# Patient Record
Sex: Female | Born: 1980 | Race: White | Hispanic: No | Marital: Single | Smoking: Never smoker
Health system: Southern US, Community
[De-identification: ages and names within clinical notes are randomized; demographics above are authoritative.]

## PROBLEM LIST (undated history)

## (undated) DIAGNOSIS — K219 Gastro-esophageal reflux disease without esophagitis: Secondary | ICD-10-CM

## (undated) DIAGNOSIS — E039 Hypothyroidism, unspecified: Secondary | ICD-10-CM

## (undated) DIAGNOSIS — K5900 Constipation, unspecified: Secondary | ICD-10-CM

## (undated) DIAGNOSIS — M549 Dorsalgia, unspecified: Secondary | ICD-10-CM

## (undated) DIAGNOSIS — F419 Anxiety disorder, unspecified: Secondary | ICD-10-CM

## (undated) DIAGNOSIS — J45909 Unspecified asthma, uncomplicated: Secondary | ICD-10-CM

## (undated) DIAGNOSIS — R5383 Other fatigue: Secondary | ICD-10-CM

## (undated) DIAGNOSIS — G4730 Sleep apnea, unspecified: Secondary | ICD-10-CM

## (undated) DIAGNOSIS — I10 Essential (primary) hypertension: Secondary | ICD-10-CM

## (undated) DIAGNOSIS — T783XXA Angioneurotic edema, initial encounter: Secondary | ICD-10-CM

## (undated) HISTORY — DX: Other fatigue: R53.83

## (undated) HISTORY — DX: Constipation, unspecified: K59.00

## (undated) HISTORY — PX: TONSILLECTOMY: SUR1361

## (undated) HISTORY — DX: Hypothyroidism, unspecified: E03.9

## (undated) HISTORY — DX: Angioneurotic edema, initial encounter: T78.3XXA

## (undated) HISTORY — PX: BACK SURGERY: SHX140

## (undated) HISTORY — PX: COLONOSCOPY: SHX174

## (undated) HISTORY — PX: WISDOM TOOTH EXTRACTION: SHX21

---

## 2008-01-18 ENCOUNTER — Emergency Department (HOSPITAL_COMMUNITY): Admission: EM | Admit: 2008-01-18 | Discharge: 2008-01-19 | Payer: Self-pay | Admitting: Emergency Medicine

## 2010-07-25 ENCOUNTER — Encounter: Payer: Self-pay | Admitting: Family Medicine

## 2011-04-01 LAB — DIFFERENTIAL
Basophils Absolute: 0
Lymphocytes Relative: 6 — ABNORMAL LOW
Monocytes Absolute: 1.3 — ABNORMAL HIGH
Neutro Abs: 15.7 — ABNORMAL HIGH

## 2011-04-01 LAB — CBC
Hemoglobin: 14.2
RBC: 4.36
RDW: 12.6
WBC: 18 — ABNORMAL HIGH

## 2011-04-01 LAB — BASIC METABOLIC PANEL
BUN: 7
GFR calc non Af Amer: >60
Potassium: 4
Sodium: 133 — ABNORMAL LOW

## 2013-08-15 ENCOUNTER — Other Ambulatory Visit: Payer: Self-pay | Admitting: Family Medicine

## 2013-08-15 DIAGNOSIS — J329 Chronic sinusitis, unspecified: Secondary | ICD-10-CM

## 2013-08-21 ENCOUNTER — Other Ambulatory Visit: Payer: Self-pay

## 2013-08-28 ENCOUNTER — Ambulatory Visit
Admission: RE | Admit: 2013-08-28 | Discharge: 2013-08-28 | Disposition: A | Discharge status: Home / Self Care | Payer: BC Managed Care – PPO | Source: Ambulatory Visit | Attending: Family Medicine | Admitting: Family Medicine

## 2013-08-28 DIAGNOSIS — J329 Chronic sinusitis, unspecified: Secondary | ICD-10-CM

## 2016-07-16 DIAGNOSIS — J069 Acute upper respiratory infection, unspecified: Secondary | ICD-10-CM | POA: Diagnosis not present

## 2016-07-16 DIAGNOSIS — J309 Allergic rhinitis, unspecified: Secondary | ICD-10-CM | POA: Diagnosis not present

## 2016-07-16 DIAGNOSIS — R040 Epistaxis: Secondary | ICD-10-CM | POA: Diagnosis not present

## 2016-07-23 DIAGNOSIS — J209 Acute bronchitis, unspecified: Secondary | ICD-10-CM | POA: Diagnosis not present

## 2016-10-31 DIAGNOSIS — E039 Hypothyroidism, unspecified: Secondary | ICD-10-CM | POA: Diagnosis not present

## 2016-11-21 DIAGNOSIS — M5432 Sciatica, left side: Secondary | ICD-10-CM | POA: Diagnosis not present

## 2016-11-21 DIAGNOSIS — M9902 Segmental and somatic dysfunction of thoracic region: Secondary | ICD-10-CM | POA: Diagnosis not present

## 2016-11-21 DIAGNOSIS — M9901 Segmental and somatic dysfunction of cervical region: Secondary | ICD-10-CM | POA: Diagnosis not present

## 2016-11-30 DIAGNOSIS — M5432 Sciatica, left side: Secondary | ICD-10-CM | POA: Diagnosis not present

## 2016-11-30 DIAGNOSIS — M9901 Segmental and somatic dysfunction of cervical region: Secondary | ICD-10-CM | POA: Diagnosis not present

## 2016-11-30 DIAGNOSIS — M9902 Segmental and somatic dysfunction of thoracic region: Secondary | ICD-10-CM | POA: Diagnosis not present

## 2016-12-15 DIAGNOSIS — K591 Functional diarrhea: Secondary | ICD-10-CM | POA: Diagnosis not present

## 2016-12-16 DIAGNOSIS — J0100 Acute maxillary sinusitis, unspecified: Secondary | ICD-10-CM | POA: Diagnosis not present

## 2016-12-19 DIAGNOSIS — K5900 Constipation, unspecified: Secondary | ICD-10-CM | POA: Diagnosis not present

## 2016-12-25 DIAGNOSIS — J018 Other acute sinusitis: Secondary | ICD-10-CM | POA: Diagnosis not present

## 2016-12-25 DIAGNOSIS — J3089 Other allergic rhinitis: Secondary | ICD-10-CM | POA: Diagnosis not present

## 2017-01-02 DIAGNOSIS — Z23 Encounter for immunization: Secondary | ICD-10-CM | POA: Diagnosis not present

## 2017-01-02 DIAGNOSIS — Z7189 Other specified counseling: Secondary | ICD-10-CM | POA: Diagnosis not present

## 2017-01-02 DIAGNOSIS — J309 Allergic rhinitis, unspecified: Secondary | ICD-10-CM | POA: Diagnosis not present

## 2017-03-11 DIAGNOSIS — N390 Urinary tract infection, site not specified: Secondary | ICD-10-CM | POA: Diagnosis not present

## 2017-05-03 DIAGNOSIS — Z23 Encounter for immunization: Secondary | ICD-10-CM | POA: Diagnosis not present

## 2017-05-16 DIAGNOSIS — Z23 Encounter for immunization: Secondary | ICD-10-CM | POA: Diagnosis not present

## 2017-05-16 DIAGNOSIS — R0683 Snoring: Secondary | ICD-10-CM | POA: Diagnosis not present

## 2017-05-16 DIAGNOSIS — J45909 Unspecified asthma, uncomplicated: Secondary | ICD-10-CM | POA: Diagnosis not present

## 2017-05-18 DIAGNOSIS — J45901 Unspecified asthma with (acute) exacerbation: Secondary | ICD-10-CM | POA: Diagnosis not present

## 2017-06-05 DIAGNOSIS — Z23 Encounter for immunization: Secondary | ICD-10-CM | POA: Diagnosis not present

## 2017-07-27 DIAGNOSIS — J0100 Acute maxillary sinusitis, unspecified: Secondary | ICD-10-CM | POA: Diagnosis not present

## 2017-07-27 DIAGNOSIS — J111 Influenza due to unidentified influenza virus with other respiratory manifestations: Secondary | ICD-10-CM | POA: Diagnosis not present

## 2017-07-27 DIAGNOSIS — J209 Acute bronchitis, unspecified: Secondary | ICD-10-CM | POA: Diagnosis not present

## 2017-09-01 DIAGNOSIS — J019 Acute sinusitis, unspecified: Secondary | ICD-10-CM | POA: Diagnosis not present

## 2017-09-01 DIAGNOSIS — J4521 Mild intermittent asthma with (acute) exacerbation: Secondary | ICD-10-CM | POA: Diagnosis not present

## 2017-09-01 DIAGNOSIS — J111 Influenza due to unidentified influenza virus with other respiratory manifestations: Secondary | ICD-10-CM | POA: Diagnosis not present

## 2017-11-06 DIAGNOSIS — Z23 Encounter for immunization: Secondary | ICD-10-CM | POA: Diagnosis not present

## 2017-11-20 DIAGNOSIS — J0100 Acute maxillary sinusitis, unspecified: Secondary | ICD-10-CM | POA: Diagnosis not present

## 2017-11-20 DIAGNOSIS — J209 Acute bronchitis, unspecified: Secondary | ICD-10-CM | POA: Diagnosis not present

## 2017-11-20 DIAGNOSIS — J45909 Unspecified asthma, uncomplicated: Secondary | ICD-10-CM | POA: Diagnosis not present

## 2017-12-21 DIAGNOSIS — R112 Nausea with vomiting, unspecified: Secondary | ICD-10-CM | POA: Diagnosis not present

## 2017-12-22 DIAGNOSIS — R112 Nausea with vomiting, unspecified: Secondary | ICD-10-CM | POA: Diagnosis not present

## 2017-12-22 DIAGNOSIS — R0981 Nasal congestion: Secondary | ICD-10-CM | POA: Diagnosis not present

## 2017-12-25 DIAGNOSIS — R112 Nausea with vomiting, unspecified: Secondary | ICD-10-CM | POA: Diagnosis not present

## 2017-12-26 DIAGNOSIS — N951 Menopausal and female climacteric states: Secondary | ICD-10-CM | POA: Diagnosis not present

## 2017-12-27 DIAGNOSIS — E039 Hypothyroidism, unspecified: Secondary | ICD-10-CM | POA: Diagnosis not present

## 2017-12-27 DIAGNOSIS — E7800 Pure hypercholesterolemia, unspecified: Secondary | ICD-10-CM | POA: Diagnosis not present

## 2018-01-02 ENCOUNTER — Other Ambulatory Visit: Payer: Self-pay | Admitting: Gastroenterology

## 2018-01-02 DIAGNOSIS — R945 Abnormal results of liver function studies: Secondary | ICD-10-CM | POA: Diagnosis not present

## 2018-01-02 DIAGNOSIS — R110 Nausea: Secondary | ICD-10-CM

## 2018-01-02 DIAGNOSIS — R7989 Other specified abnormal findings of blood chemistry: Secondary | ICD-10-CM

## 2018-01-05 ENCOUNTER — Ambulatory Visit
Admission: RE | Admit: 2018-01-05 | Discharge: 2018-01-05 | Disposition: A | Discharge status: Home / Self Care | Payer: No Typology Code available for payment source | Source: Ambulatory Visit | Attending: Gastroenterology | Admitting: Gastroenterology

## 2018-01-05 DIAGNOSIS — R110 Nausea: Secondary | ICD-10-CM

## 2018-01-05 DIAGNOSIS — R945 Abnormal results of liver function studies: Secondary | ICD-10-CM

## 2018-01-05 DIAGNOSIS — R7989 Other specified abnormal findings of blood chemistry: Secondary | ICD-10-CM | POA: Diagnosis not present

## 2018-02-02 DIAGNOSIS — J019 Acute sinusitis, unspecified: Secondary | ICD-10-CM | POA: Diagnosis not present

## 2018-02-13 DIAGNOSIS — J019 Acute sinusitis, unspecified: Secondary | ICD-10-CM | POA: Diagnosis not present

## 2018-02-13 DIAGNOSIS — J45901 Unspecified asthma with (acute) exacerbation: Secondary | ICD-10-CM | POA: Diagnosis not present

## 2018-03-08 DIAGNOSIS — J209 Acute bronchitis, unspecified: Secondary | ICD-10-CM | POA: Diagnosis not present

## 2018-04-13 DIAGNOSIS — Z23 Encounter for immunization: Secondary | ICD-10-CM | POA: Diagnosis not present

## 2018-04-13 DIAGNOSIS — J0100 Acute maxillary sinusitis, unspecified: Secondary | ICD-10-CM | POA: Diagnosis not present

## 2018-05-07 DIAGNOSIS — J209 Acute bronchitis, unspecified: Secondary | ICD-10-CM | POA: Diagnosis not present

## 2018-05-11 DIAGNOSIS — R05 Cough: Secondary | ICD-10-CM | POA: Diagnosis not present

## 2018-05-17 ENCOUNTER — Ambulatory Visit (INDEPENDENT_AMBULATORY_CARE_PROVIDER_SITE_OTHER): Payer: 59 | Admitting: Psychiatry

## 2018-05-17 ENCOUNTER — Encounter: Payer: Self-pay | Admitting: Psychiatry

## 2018-05-17 DIAGNOSIS — F325 Major depressive disorder, single episode, in full remission: Secondary | ICD-10-CM | POA: Diagnosis not present

## 2018-05-17 DIAGNOSIS — F4001 Agoraphobia with panic disorder: Secondary | ICD-10-CM | POA: Diagnosis not present

## 2018-05-17 MED ORDER — PAROXETINE HCL 20 MG PO TABS: 40.0000 mg | ORAL_TABLET | Freq: Every day | ORAL | 0 refills | Status: DC

## 2018-05-17 NOTE — Progress Notes (Signed)
Carolyn Castro [id number] [birth year] 37 y.o.  Subjective:   Patient ID:  Carolyn Castro is a 37 y.o. (DOB Apr 28, 1981) female.  Chief Complaint:  Chief Complaint  Patient presents with  . Anxiety  . Follow-up    med changes    HPI Carolyn Castro presents to the office today for follow-up of panic disorder after change at last visit from sertraline to paroxetine 40. Worked a lot better.  Feels less down.  Sleeps better.  Better motivation.  Less dread of work.  More normal interest and motivation and activity.  Lighter and more hopeful.  Panic reduced to 1/2 weeks.  Was having 3-4/week.  On paxil 40 for 4 weeks.    Review of Systems:  Review of Systems  Respiratory: Positive for cough, chest tightness, shortness of breath and wheezing.   Neurological: Positive for weakness. Negative for tremors.  Psychiatric/Behavioral: Negative for agitation, behavioral problems, confusion, decreased concentration, dysphoric mood, hallucinations, self-injury, sleep disturbance and suicidal ideas. The patient is not nervous/anxious and is not hyperactive.   asthma about the same.  Medications: I have reviewed the patient's current medications.  Current Outpatient Medications  Medication Sig Dispense Refill  . albuterol (ACCUNEB) 0.63 MG/3ML nebulizer solution Take 1 ampule by nebulization every 6 (six) hours as needed for wheezing.    Marland Kitchen ALPRAZolam (XANAX) 0.5 MG tablet Take 0.5 mg by mouth 2 (two) times daily as needed for anxiety.    . fexofenadine (ALLEGRA) 180 MG tablet Take 180 mg by mouth daily.    Marland Kitchen levothyroxine (SYNTHROID, LEVOTHROID) 75 MCG tablet Take 75 mcg by mouth daily before breakfast.    . mometasone (ASMANEX) 220 MCG/INH inhaler Inhale 2 puffs into the lungs daily.    . mometasone (NASONEX) 50 MCG/ACT nasal spray Place 2 sprays into the nose daily.    . montelukast (SINGULAIR) 10 MG tablet Take 10 mg by mouth at bedtime.    Marland Kitchen PARoxetine (PAXIL) 20 MG tablet Take 2  tablets (40 mg total) by mouth daily. 180 tablet 0   No current facility-administered medications for this visit.     Medication Side Effects: None  Allergies:  Allergies  Allergen Reactions  . Ciprofloxacin Hcl Diarrhea    C Diff    History reviewed. No pertinent past medical history.  History reviewed. No pertinent family history.  Social History   Socioeconomic History  . Marital status: Single    Spouse name: Not on file  . Number of children: Not on file  . Years of education: Not on file  . Highest education level: Not on file  Occupational History  . Not on file  Social Needs  . Financial resource strain: Not on file  . Food insecurity:    Worry: Not on file    Inability: Not on file  . Transportation needs:    Medical: Not on file    Non-medical: Not on file  Tobacco Use  . Smoking status: Never Smoker  . Smokeless tobacco: Never Used  Substance and Sexual Activity  . Alcohol use: Not on file  . Drug use: Not on file  . Sexual activity: Not on file  Lifestyle  . Physical activity:    Days per week: Not on file    Minutes per session: Not on file  . Stress: Not on file  Relationships  . Social connections:    Talks on phone: Not on file    Gets together: Not on file    Attends religious  service: Not on file    Active member of club or organization: Not on file    Attends meetings of clubs or organizations: Not on file    Relationship status: Not on file  . Intimate partner violence:    Fear of current or ex partner: Not on file    Emotionally abused: Not on file    Physically abused: Not on file    Forced sexual activity: Not on file  Other Topics Concern  . Not on file  Social History Narrative  . Not on file    Past Medical History, Surgical history, Social history, and Family history were reviewed and updated as appropriate.   Please see review of systems for further details on the patient's review from today.   Objective:   Physical  Exam:  There were no vitals taken for this visit.  Physical Exam  Constitutional: She is oriented to person, place, and time. She appears well-developed. No distress.  Musculoskeletal: She exhibits no deformity.  Neurological: She is alert and oriented to person, place, and time. She displays no tremor. Coordination and gait normal.  Psychiatric: Her speech is normal and behavior is normal. Judgment and thought content normal. Her mood appears anxious. Her affect is not angry, not blunt, not labile and not inappropriate. Cognition and memory are normal. She does not exhibit a depressed mood. She expresses no homicidal and no suicidal ideation. She expresses no suicidal plans and no homicidal plans.  Insight intact. No auditory or visual hallucinations. Less depressed and less anxious. Anxiety more manageable with less Xananax.    Lab Review:     Component Value Date/Time   NA 133 (L) 01/19/2008 0003   K 4.0 01/19/2008 0003   CL 102 01/19/2008 0003   CO2 24 01/19/2008 0003   GLUCOSE 94 01/19/2008 0003   BUN 7 01/19/2008 0003   CREATININE 1.07 01/19/2008 0003   CALCIUM 8.9 01/19/2008 0003   GFRNONAA >60 01/19/2008 0003   GFRAA  01/19/2008 0003    >60        The eGFR has been calculated using the MDRD equation. This calculation has not been validated in all clinical       Component Value Date/Time   WBC 18.0 (H) 01/19/2008 0003   RBC 4.36 01/19/2008 0003   HGB 14.2 01/19/2008 0003   HCT 40.3 01/19/2008 0003   PLT 300 01/19/2008 0003   MCV 92.6 01/19/2008 0003   MCHC 35.3 01/19/2008 0003   RDW 12.6 01/19/2008 0003   LYMPHSABS 1.0 01/19/2008 0003   MONOABS 1.3 (H) 01/19/2008 0003   EOSABS 0.0 01/19/2008 0003   BASOSABS 0.0 01/19/2008 0003    No results found for: POCLITH, LITHIUM   No results found for: PHENYTOIN, PHENOBARB, VALPROATE, CBMZ   .res Assessment: Plan:    Panic disorder with agoraphobia - Plan: PARoxetine (PAXIL) 20 MG tablet  Major depressive  disorder with single episode, in full remission ([place]) - Plan: PARoxetine (PAXIL) 20 MG tablet   History of severe panic disorder and mild to moderate major depression which did not respond to sertraline 200 mg a day.  She tolerated the switch to 40 mg paroxetine well.  She is had an excellent response so far with a dramatic reduction in panic attacks.  They have not stopped at this point however.  She uses Xanax as needed and there is no evidence of abuse.  Greatly relieved at the excellent response to paroxetine vs sertraline.  We discussed the short-term  risks associated with benzodiazepines including sedation and increased fall risk among others.  Discussed long-term side effect risk including dependence, potential withdrawal symptoms, and the potential eventual dose-related risk of dementia.  This appointment was 15 minutes  Follow-up 8 weeks  Lynder Parents MD, DFAPA Please see After Visit Summary for patient specific instructions.  No future appointments.  No orders of the defined types were placed in this encounter.     -------------------------------

## 2018-06-14 DIAGNOSIS — M545 Low back pain: Secondary | ICD-10-CM | POA: Diagnosis not present

## 2018-06-14 DIAGNOSIS — R102 Pelvic and perineal pain: Secondary | ICD-10-CM | POA: Diagnosis not present

## 2018-06-19 DIAGNOSIS — R102 Pelvic and perineal pain: Secondary | ICD-10-CM | POA: Diagnosis not present

## 2018-06-19 DIAGNOSIS — N949 Unspecified condition associated with female genital organs and menstrual cycle: Secondary | ICD-10-CM | POA: Diagnosis not present

## 2018-06-19 DIAGNOSIS — J45909 Unspecified asthma, uncomplicated: Secondary | ICD-10-CM | POA: Diagnosis not present

## 2018-06-20 ENCOUNTER — Other Ambulatory Visit: Payer: Self-pay

## 2018-06-20 ENCOUNTER — Encounter (HOSPITAL_BASED_OUTPATIENT_CLINIC_OR_DEPARTMENT_OTHER): Payer: Self-pay | Admitting: *Deleted

## 2018-06-25 ENCOUNTER — Other Ambulatory Visit: Payer: Self-pay | Admitting: Obstetrics and Gynecology

## 2018-06-28 ENCOUNTER — Other Ambulatory Visit: Payer: Self-pay | Admitting: Obstetrics and Gynecology

## 2018-06-28 NOTE — H&P (Signed)
Chief Complaint(s):   New Patient/L Adnexal Pain and Cyst found on ultrasound/Ref from [employer]   HPI:  General 37 y/o presents as new patient for left adnexal pain and cyst She was referred to the office by Dr. Shirlean Mylararol Webb U/S results from 06/15/2018 revealed uterus at 8.3cm X 3.9cm X 4.6cm. Enometrial thickening of 4.3cm. Right ovary measured at 2.5cm however showed multi septated left adenxal cystic lesion that was measuring at 7.1cm X 4.2cm X 5.2cm. She states she is experiencing pain as in a knife being twisted to the left ovary. The pain is so severe it is now throbbing to lower back and legs. Feels nauseous as well. States it feels as if it is constricting in feeling. She rates the pain as 10/10, in office today she rates it as 7/10. She has been taking Ibuprofen and Tramadol for pain management. She mentions if she's resting at home the pain is at a 6/10 but gets worse when she starts moving. States this morning pain started to radiate up the left side of the abdomen and has gotten so bad with pain.  Denies any sexual activity, is a virgin. Has been experiencing constipation and diarrhea.  Mentions every so often after her menses through the years she'll experience sharp pain than experience a "burst", pain subsides shortly afterwards. Pelvic exam performed in office today. Denies itching, discharge, or odor from vagina. Current Medication: Taking Albuterol 90 MCG/ACT Aerosol Solution 2 puffs Inhalation as needed. Aleve(Naproxen) 220 MG Capsule Orally as needed. Alprazolam 1 MG Tablet 1/2 tab Orally Twice a day prn. Asmanex HFA(Mometasone Furoate) 200 MCG/ACT Aerosol 2 puffs Inhalation Twice a day. Benadryl(diphenhydrAMINE HCl) 25 MG Capsule 1 capsule Orally every 6 hrs as needed. Colace(Docusate Sodium) 100 MG Capsule 1 capsule Orally twice a day. Cyclobenzaprine HCl 10 MG Tablet TAKE 1 TABLET BY MOUTH AT BEDTIME AS NEEDED FOR MUSCLE SPAMS. Fexofenadine HCl 180 MG Tablet 1  tablet Orally Once a day. Ibuprofen 200 MG Tablet 3-4 tablets Orally every 6 hrs as needed. Ipratropium-Albuterol 0.5-2.5 (3) MG/3ML Solution USE 1 VIAL IN NEBULIZER EVERY 6 HRS AS NEEDED. MiraLax(Polyethylene Glycol 3350) . Packet as directed Orally 1 capful once a day as needed. Nasonex(Mometasone Furoate) 50 MCG/ACT Suspension 2 sprays in each nostril Nasally Once a day. Ondansetron HCl 4 MG Tablet Q4-6 H PRN nausea Orally. Paroxetine HCl 20 MG Tablet 2 tablets in the morning Orally Once a day. Singulair(Montelukast Sodium) 10 MG Tablet 1 tablet in the evening Orally Once a day. Synthroid(L-Thyroxine Sodium) 75 MCG Tablet 1 tablet Orally Once a day. Tramadol HCl 50 MG Tablet 1-2 tablet as needed Orally once daily prn severe pain. Tylenol Extra Strength(APAP) 500 MG Tablet 2 tablet as needed Orally every 6 hrs. Medication List reviewed and reconciled with the patient. Medical History:  allergic rhinitis, asthma- illness induced- testing > 10 years ago     anxiety, panic attacks- controlled without meds     prone to mouth ulcers     Other symptoms referable to back     Pain in joint, ankle and foot     Migraine with aura, without mention of intractable migraine without mention of status migrainosus     Allergic rhinitis due to pollen     Esophageal reflux     Pain in joint, ankle and foot     Other symptoms referable to back     Obesity (BMI 30-39.9)     Asthma, mild intermittent     Generalized anxiety disorder  Seasonal allergies     rectal bleeding- anal fissure, hemorrhoids     hemorrhoids     Hypothyroidism      Allergies/Intolerance:  Cipro: Side Effects - c-diff     Fluticasone Propionate: Side Effects - Headaches     Nasacort Allergy 24HR: Allergy - rash on face   Gyn History:  Sexual activity never sexually active. Periods : regular. LMP 05/21/18. Birth control none. Denies Last pap smear date never had one. Last mammogram date N/A.   OB History:   Never been pregnant per patient.   Surgical History:  wisdom teeth extraction     colonoscopy 10/16/2014     tonsillectomy 10/24/14     Right ear - tube 10/24/14   Hospitalization:  No Hospitalization History.   Family History:  Father: alive 5761 yrs, hypercholesterolemia, hypertension, knee replacement, surgery to remove scar tissue on l leg, anxiety, diagnosed with Hypertension    Mother: alive 6960 yrs, hypertension, chronic sinus problems, benign breast lum,ps, ovarian cysts, surgyery on eye- -?cataract, colon polyps, Hypertension    Paternal Grand Mother: alive, hypothyroidism   No heart disease, No one with stents.No breast cancer, ovarian, colon cancer or liver disease. Thyroid disease: aunt, PGM Grandmother and mother have gallbladder problems a great grandmother had pancreatic cancer.  Social History: General no EXPOSURE TO PASSIVE SMOKE.  no Alcohol.  Children: none.  Caffeine: yes, soda, 4-5 servings daily.  EDUCATION: yes, [employer].  Tobacco use cigarettes: Never smoked, Tobacco history last updated 06/19/2018, Vaping No.  Marital Status: single.  no Recreational drug use.  OCCUPATION: employed, [employer].  Exercise: yes, 5-6 times weekly , walking.   ROS: CONSTITUTIONAL No" options="no,yes" propid="91" itemid="193425" categoryid="10464" encounterid="[id number]"Chills No. No" options="no,yes" propid="91" itemid="172899" categoryid="10464" encounterid="[id number]"Fatigue No. No" options="no,yes" propid="91" itemid="10467" categoryid="10464" encounterid="[id number]"Fever No. No" options="no,yes" propid="91" itemid="193426" categoryid="10464" encounterid="[id number]"Night sweats No. No" options="no,yes" propid="91" itemid="444261" categoryid="10464" encounterid="[id number]"Recent travel outside [place] No. No" options="no,yes" propid="91" itemid="193427" categoryid="10464" encounterid="[id number]"Sweats No. No" options="no,yes" propid="91" itemid="194825"  categoryid="10464" encounterid="[id number]"Weight change No.  OPHTHALMOLOGY no" options="no,yes" propid="91" itemid="12520" categoryid="12516" encounterid="[id number]"Blurring of vision no. no" options="no,yes" propid="91" itemid="193469" categoryid="12516" encounterid="[id number]"Change in vision no. no" options="no,yes" propid="91" itemid="194379" categoryid="12516" encounterid="[id number]"Double vision no.  ENT no" options="no,yes" propid="91" itemid="193612" categoryid="10481" encounterid="[id number]"Dizziness no. Nose bleeds no. Sore throat no. Teeth pain no.  ALLERGY no" options="no,yes" propid="91" itemid="202589" categoryid="138152" encounterid="[id number]"Hives no.  CARDIOLOGY no" options="no,yes" propid="91" itemid="193603" categoryid="10488" encounterid="[id number]"Chest pain no. no" options="no,yes" propid="91" itemid="199089" categoryid="10488" encounterid="[id number]"High blood pressure no. no" options="no,yes" propid="91" itemid="202598" categoryid="10488" encounterid="[id number]"Irregular heart beat no. no" options="no,yes" propid="91" itemid="10491" categoryid="10488" encounterid="[id number]"Leg edema no. no" options="no,yes" propid="91" itemid="10490" categoryid="10488" encounterid="[id number]"Palpitations no.  RESPIRATORY no" options="no" propid="91" itemid="270013" categoryid="138132" encounterid="[id number]"Shortness of breath no. no" options="no,yes" propid="91" itemid="172745" categoryid="138132" encounterid="[id number]"Cough no. no" options="no,yes" propid="91" itemid="193621" categoryid="138132" encounterid="[id number]"Wheezing no.  UROLOGY no" options="no,yes" propid="91" itemid="194377" categoryid="138166" encounterid="[id number]"Pain with urination no. no" options="no,yes" propid="91" itemid="193493" categoryid="138166" encounterid="[id number]"Urinary urgency no. no" options="no,yes" propid="91" itemid="193492" categoryid="138166" encounterid="[id number]"Urinary frequency no. no" options="no,yes" propid="91"  itemid="138171" categoryid="138166" encounterid="[id number]"Urinary incontinence no. No" options="no,yes" propid="91" itemid="138167" categoryid="138166" encounterid="[id number]"Difficulty urinating No. No" options="no,yes" propid="91" itemid="138168" categoryid="138166" encounterid="[id number]"Blood in urine No.  GASTROENTEROLOGY no" options="no,yes" propid="91" itemid="10496" categoryid="10494" encounterid="[id number]"Abdominal pain no. no" options="no,yes" propid="91" itemid="193447" categoryid="10494" encounterid="[id number]"Appetite change no. no" options="no,yes" propid="91" itemid="193448" categoryid="10494" encounterid="[id number]"Bloating/belching no. no" options="no,yes" propid="91" itemid="10503" categoryid="10494" encounterid="[id number]"Blood in stool or on toilet paper no. no" options="no,yes" propid="91" itemid="199106" categoryid="10494" encounterid="[id number]"Change in bowel movements no. no" options="no,yes" propid="91" itemid="10501" categoryid="10494" encounterid="[id number]"Constipation no. no" options="no,yes" propid="91" itemid="10502" categoryid="10494" encounterid="[id number]"Diarrhea no. no" options="no,yes" propid="91" itemid="199104" categoryid="10494" encounterid="[id number]"Difficulty swallowing no. no" options="no,yes" propid="91" itemid="10499" categoryid="10494" encounterid="[id number]"Nausea no.  FEMALE REPRODUCTIVE no"  options="no,yes" propid="91" itemid="453725" categoryid="10525" encounterid="[id number]"Vulvar pain no. no" options="no,yes" propid="91" itemid="453726" categoryid="10525" encounterid="[id number]"Vulvar rash no. no" options="no, yes" propid="91" itemid="444315" categoryid="10525" encounterid="[id number]"Abnormal vaginal bleeding no. no" options="no,yes" propid="91" itemid="186083" categoryid="10525" encounterid="[id number]"Breast pain no. no" options="no,yes" propid="91" itemid="186084" categoryid="10525" encounterid="[id number]"Nipple discharge no. no" options="no,yes" propid="91"  itemid="275823" categoryid="10525" encounterid="[id number]"Pain with intercourse no. yes" options="no,yes" propid="91" itemid="186082" categoryid="10525" encounterid="[id number]"Pelvic pain yes. no" options="no,yes" propid="91" itemid="278230" categoryid="10525" encounterid="[id number]"Unusual vaginal discharge no. no" options="no,yes" propid="91" itemid="278942" categoryid="10525" encounterid="[id number]"Vaginal itching no.  MUSCULOSKELETAL no" options="no,yes" propid="91" itemid="193461" categoryid="10514" encounterid="[id number]"Muscle aches no.  NEUROLOGY no" options="no,yes" propid="91" itemid="12513" categoryid="12512" encounterid="[id number]"Headache no. no" options="no,yes" propid="91" itemid="12514" categoryid="12512" encounterid="[id number]"Tingling/numbness no. no" options="no,yes" propid="91" itemid="193468" categoryid="12512" encounterid="[id number]"Weakness no.  PSYCHOLOGY no" options="" propid="91" itemid="275919" categoryid="10520" encounterid="[id number]"Depression no. no" options="no,yes" propid="91" itemid="172748" categoryid="10520" encounterid="[id number]"Anxiety no. no" options="no,yes" propid="91" itemid="199158" categoryid="10520" encounterid="[id number]"Nervousness no. no" options="no,yes" propid="91" itemid="12502" categoryid="10520" encounterid="[id number]"Sleep disturbances no. no " options="no,yes" propid="91" itemid="72718" categoryid="10520" encounterid="[id number]"Suicidal ideation no .  ENDOCRINOLOGY no" options="no,yes" propid="91" itemid="194628" categoryid="12508" encounterid="[id number]"Excessive thirst no. no" options="no,yes" propid="91" itemid="196285" categoryid="12508" encounterid="[id number]"Excessive urination no. no" options="no, yes" propid="91" itemid="444314" categoryid="12508" encounterid="[id number]"Hair loss no. no" options="" propid="91" itemid="447284" categoryid="12508" encounterid="[id number]"Heat or cold intolerance no.  HEMATOLOGY/LYMPH no" options="no,yes" propid="91"  itemid="199152" categoryid="138157" encounterid="[id number]"Abnormal bleeding no. no" options="no,yes" propid="91" itemid="170653" categoryid="138157" encounterid="[id number]"Easy bruising no. no" options="no,yes" propid="91" itemid="138158" categoryid="138157" encounterid="[id number]"Swollen glands no.  DERMATOLOGY no" options="no,yes" propid="91" itemid="199126" categoryid="12503" encounterid="[id number]"New/changing skin lesion no. no" options="no,yes" propid="91" itemid="12504" categoryid="12503" encounterid="[id number]"Rash no. no" options="" propid="91" itemid="444313" categoryid="12503" encounterid="[id number]"Sores no.   Negative except as stated in HPI.  Objective: Vitals: Wt 263, Wt change 6.4 lb, Ht 62.5, BMI 47.33, Pulse sitting 97, BP sitting 132/78  Past Results: Examination:  General Examination alert, oriented, NAD " categoryPropId="[id number]" examid="193638"CONSTITUTIONAL: alert, oriented, NAD .  moist, warm" categoryPropId="10109" examid="193638"SKIN: moist, warm.  Conjunctiva clear" categoryPropId="21468" examid="193638"EYES: Conjunctiva clear.  clear to auscultation bilaterally" categoryPropId="87" examid="193638"LUNGS: clear to auscultation bilaterally.  soft, .. tender in the left lower quadrant.. /non-distended, bowel sounds present " categoryPropId="88" examid="193638"ABDOMEN: soft, .. tender in the left lower quadrant.. /non-distended, bowel sounds present .  normal external genitalia, labia - unremarkable, vagina - pink moist mucosa, no lesions or abnormal discharge, cervix - no discharge or lesions or CMT, adnexa - left adnexal tenderness., uterus - nontender and normal size on palpation " categoryPropId="13414" examid="193638"FEMALE GENITOURINARY: normal external genitalia, labia - unremarkable, vagina - pink moist mucosa, no lesions or abnormal discharge, cervix - no discharge or lesions or CMT, adnexa - left adnexal tenderness., uterus - nontender and normal size on palpation .  affect  normal, good eye contact" categoryPropId="16316" examid="193638"PSYCH: affect normal, good eye contact.  Physical Examination: Chaperone present for pelvic exam, Chapman,Courtney 06/19/2018 10:23:14 AM &gt; "Chaperone present for pelvic exam, Chapman,Courtney 06/19/2018 10:23:14 AM > .   Pt aware of scribe services today.   Assessment: Assessment:  Adnexal mass - N94.9 (Primary)     Pelvic pain - R10.2     Asthma - J45.909     Plan: Treatment: Adnexal mass ZOX:WRUEAVW Malignancy Risk-ROMA ([id number])  Notes: Recommend a laparoscopy, with left ovariay cystectomy possible left oophorectomy ... she may lose ovary on left side. Test to check for increase risk of cancer in cyst. Advised it could just be benign. If it comes back at high risk of malignancy will refer to high risk gyn oncology for surgery. Discussed risks of surgery with patient including but not limited to infection/bleeding, damage to bowel, bladder ureters and surrounding organs with the need for further surgery. She is to avoid taking Aleve or Ibuprofen from now until surgery. No eating or drinking night before surgery. Marland Kitchen  Referral To: Reason:please check insurance coverage and schedule laparoscopic left ovarian cystectomy possible left oophorectomy  Pelvic pain Notes: Prescription for Percocet was written. Referral To: Reason:please check insurance coverage and schedule laparoscopic left ovarian cystectomy possible left oophorectomy  Asthma Notes: well controlled at home per patient. Procedures:  Scribe Documentation Attestation: I personally scribed for Dr. Richardson Dopp on the date of this appointment. Electronically signed by scribe Laurelyn Sickle.  Venipuncture performed in right arm" label="Venipuncture:" notes="Howell,Kathleen 06/19/2018 10:34:12 AM &gt; performed in right arm" options="" propid="" itemid="202726" categoryid="[id number]" encounterid="[id number]"Venipuncture: Howell,Kathleen 06/19/2018 10:34:12 AM > performed in right  arm.   Immunizations: Therapeutic Injections: Diagnostic Imaging: Lab Reports: ZOX:WRUEAVW Malignancy Risk-ROMA ([id number])  CA125 -  23.5  0.0-38.1 - U/mL  HE4 -  47.8  0.0-61.2 - pmol/L  Premenopausal ROMA -  0.69  See below -   Postmenopausal ROMA -  1.47  See below -   Comment: -  Comment  -     Carolyn Castro 06/26/2018 08:30:09 AM > low risk for malignancy

## 2018-06-28 NOTE — Anesthesia Preprocedure Evaluation (Addendum)
Anesthesia Evaluation  Patient identified by MRN, date of birth, ID band Patient awake    Reviewed: Allergy & Precautions, NPO status , Patient's Chart, lab work & pertinent test results  History of Anesthesia Complications Negative for: history of anesthetic complications  Airway Mallampati: II  TM Distance: >3 FB Neck ROM: Full    Dental no notable dental hx. (+) Teeth Intact, Dental Advisory Given   Pulmonary asthma ,    Pulmonary exam normal breath sounds clear to auscultation       Cardiovascular negative cardio ROS Normal cardiovascular exam Rhythm:Regular Rate:Normal     Neuro/Psych PSYCHIATRIC DISORDERS Anxiety negative neurological ROS     GI/Hepatic negative GI ROS, Neg liver ROS,   Endo/Other  Hypothyroidism Morbid obesity  Renal/GU negative Renal ROS  negative genitourinary   Musculoskeletal negative musculoskeletal ROS (+)   Abdominal (+) + obese,   Peds  Hematology negative hematology ROS (+)   Anesthesia Other Findings 37 yo F for lap ovarian cystectomy - asthma, anxiety, hypothyroid, BMI 43  Reproductive/Obstetrics negative OB ROS                           Anesthesia Physical Anesthesia Plan  ASA: III  Anesthesia Plan: General   Post-op Pain Management:    Induction: Intravenous  PONV Risk Score and Plan: 3 and Ondansetron, Dexamethasone, Midazolam and Treatment may vary due to age or medical condition  Airway Management Planned: Oral ETT  Additional Equipment: None  Intra-op Plan:   Post-operative Plan: Extubation in OR  Informed Consent: I have reviewed the patients History and Physical, chart, labs and discussed the procedure including the risks, benefits and alternatives for the proposed anesthesia with the patient or authorized representative who has indicated his/her understanding and acceptance.   Dental advisory given  Plan Discussed with:    Anesthesia Plan Comments:        Anesthesia Quick Evaluation

## 2018-06-29 ENCOUNTER — Ambulatory Visit (HOSPITAL_BASED_OUTPATIENT_CLINIC_OR_DEPARTMENT_OTHER): Payer: 59 | Admitting: Certified Registered"

## 2018-06-29 ENCOUNTER — Other Ambulatory Visit: Payer: Self-pay

## 2018-06-29 ENCOUNTER — Encounter (HOSPITAL_BASED_OUTPATIENT_CLINIC_OR_DEPARTMENT_OTHER)
Admission: RE | Disposition: A | Discharge status: Home / Self Care | Payer: Self-pay | Source: Home / Self Care | Attending: Obstetrics and Gynecology

## 2018-06-29 ENCOUNTER — Ambulatory Visit (HOSPITAL_BASED_OUTPATIENT_CLINIC_OR_DEPARTMENT_OTHER)
Admission: RE | Admit: 2018-06-29 | Discharge: 2018-06-29 | Disposition: A | Discharge status: Home / Self Care | Payer: 59 | Attending: Obstetrics and Gynecology | Admitting: Obstetrics and Gynecology

## 2018-06-29 ENCOUNTER — Encounter (HOSPITAL_BASED_OUTPATIENT_CLINIC_OR_DEPARTMENT_OTHER): Payer: Self-pay | Admitting: Certified Registered"

## 2018-06-29 DIAGNOSIS — N8312 Corpus luteum cyst of left ovary: Secondary | ICD-10-CM | POA: Insufficient documentation

## 2018-06-29 DIAGNOSIS — J302 Other seasonal allergic rhinitis: Secondary | ICD-10-CM | POA: Insufficient documentation

## 2018-06-29 DIAGNOSIS — Z79891 Long term (current) use of opiate analgesic: Secondary | ICD-10-CM | POA: Diagnosis not present

## 2018-06-29 DIAGNOSIS — N83202 Unspecified ovarian cyst, left side: Secondary | ICD-10-CM | POA: Diagnosis not present

## 2018-06-29 DIAGNOSIS — Z6841 Body Mass Index (BMI) 40.0 and over, adult: Secondary | ICD-10-CM | POA: Diagnosis not present

## 2018-06-29 DIAGNOSIS — R102 Pelvic and perineal pain unspecified side: Secondary | ICD-10-CM | POA: Diagnosis present

## 2018-06-29 DIAGNOSIS — K219 Gastro-esophageal reflux disease without esophagitis: Secondary | ICD-10-CM | POA: Diagnosis not present

## 2018-06-29 DIAGNOSIS — N9489 Other specified conditions associated with female genital organs and menstrual cycle: Secondary | ICD-10-CM | POA: Diagnosis present

## 2018-06-29 DIAGNOSIS — Z7951 Long term (current) use of inhaled steroids: Secondary | ICD-10-CM | POA: Diagnosis not present

## 2018-06-29 DIAGNOSIS — J4520 Mild intermittent asthma, uncomplicated: Secondary | ICD-10-CM | POA: Diagnosis not present

## 2018-06-29 DIAGNOSIS — N949 Unspecified condition associated with female genital organs and menstrual cycle: Secondary | ICD-10-CM | POA: Diagnosis not present

## 2018-06-29 DIAGNOSIS — Z888 Allergy status to other drugs, medicaments and biological substances status: Secondary | ICD-10-CM | POA: Diagnosis not present

## 2018-06-29 DIAGNOSIS — E039 Hypothyroidism, unspecified: Secondary | ICD-10-CM | POA: Diagnosis not present

## 2018-06-29 DIAGNOSIS — Z79899 Other long term (current) drug therapy: Secondary | ICD-10-CM | POA: Diagnosis not present

## 2018-06-29 DIAGNOSIS — Z881 Allergy status to other antibiotic agents status: Secondary | ICD-10-CM | POA: Diagnosis not present

## 2018-06-29 DIAGNOSIS — F411 Generalized anxiety disorder: Secondary | ICD-10-CM | POA: Diagnosis not present

## 2018-06-29 DIAGNOSIS — N736 Female pelvic peritoneal adhesions (postinfective): Secondary | ICD-10-CM | POA: Diagnosis not present

## 2018-06-29 HISTORY — DX: Unspecified asthma, uncomplicated: J45.909

## 2018-06-29 HISTORY — DX: Anxiety disorder, unspecified: F41.9

## 2018-06-29 HISTORY — PX: LAPAROSCOPIC OVARIAN CYSTECTOMY: SHX6248

## 2018-06-29 HISTORY — DX: Hypothyroidism, unspecified: E03.9

## 2018-06-29 LAB — POCT PREGNANCY, URINE: Preg Test, Ur: NEGATIVE

## 2018-06-29 SURGERY — EXCISION, CYST, OVARY, LAPAROSCOPIC
Anesthesia: General | Site: Abdomen | Laterality: Left

## 2018-06-29 MED ORDER — FENTANYL CITRATE (PF) 100 MCG/2ML IJ SOLN
50.0000 ug | INTRAMUSCULAR | Status: AC | PRN
  Administered 2018-06-29 (×4): 50 ug via INTRAVENOUS

## 2018-06-29 MED ORDER — PROPOFOL 500 MG/50ML IV EMUL
INTRAVENOUS | Status: DC | PRN
  Administered 2018-06-29: 25 ug/kg/min via INTRAVENOUS

## 2018-06-29 MED ORDER — LIDOCAINE 2% (20 MG/ML) 5 ML SYRINGE
INTRAMUSCULAR | Status: AC
  Filled 2018-06-29: qty 20

## 2018-06-29 MED ORDER — FENTANYL CITRATE (PF) 100 MCG/2ML IJ SOLN
INTRAMUSCULAR | Status: AC
  Filled 2018-06-29: qty 2

## 2018-06-29 MED ORDER — ALBUTEROL SULFATE HFA 108 (90 BASE) MCG/ACT IN AERS
INHALATION_SPRAY | RESPIRATORY_TRACT | Status: AC
  Filled 2018-06-29: qty 13.4

## 2018-06-29 MED ORDER — IBUPROFEN 800 MG PO TABS: 800.0000 mg | ORAL_TABLET | Freq: Three times a day (TID) | ORAL | 0 refills | Status: DC | PRN

## 2018-06-29 MED ORDER — SUGAMMADEX SODIUM 200 MG/2ML IV SOLN
INTRAVENOUS | Status: DC | PRN
  Administered 2018-06-29: 200 mg via INTRAVENOUS

## 2018-06-29 MED ORDER — OXYCODONE HCL 5 MG PO TABS: 5.0000 mg | ORAL_TABLET | Freq: Once | ORAL | Status: DC | PRN

## 2018-06-29 MED ORDER — ACETAMINOPHEN 500 MG PO TABS
1000.0000 mg | ORAL_TABLET | ORAL | Status: AC
  Administered 2018-06-29: 1000 mg via ORAL

## 2018-06-29 MED ORDER — LIDOCAINE HCL (CARDIAC) PF 100 MG/5ML IV SOSY
PREFILLED_SYRINGE | INTRAVENOUS | Status: DC | PRN
  Administered 2018-06-29: 60 mg via INTRAVENOUS

## 2018-06-29 MED ORDER — ONDANSETRON HCL 4 MG/2ML IJ SOLN
INTRAMUSCULAR | Status: DC | PRN
  Administered 2018-06-29: 4 mg via INTRAVENOUS

## 2018-06-29 MED ORDER — DEXAMETHASONE SODIUM PHOSPHATE 10 MG/ML IJ SOLN
INTRAMUSCULAR | Status: AC
  Filled 2018-06-29: qty 1

## 2018-06-29 MED ORDER — OXYCODONE HCL 5 MG/5ML PO SOLN: 5.0000 mg | Freq: Once | ORAL | Status: DC | PRN

## 2018-06-29 MED ORDER — MIDAZOLAM HCL 2 MG/2ML IJ SOLN
INTRAMUSCULAR | Status: AC
  Filled 2018-06-29: qty 2

## 2018-06-29 MED ORDER — HEPARIN SODIUM (PORCINE) 1000 UNIT/ML IJ SOLN
INTRAMUSCULAR | Status: AC
  Filled 2018-06-29: qty 1

## 2018-06-29 MED ORDER — ACETAMINOPHEN 500 MG PO TABS
ORAL_TABLET | ORAL | Status: AC
  Filled 2018-06-29: qty 2

## 2018-06-29 MED ORDER — BUPIVACAINE HCL (PF) 0.25 % IJ SOLN
INTRAMUSCULAR | Status: DC | PRN
  Administered 2018-06-29: 30 mL

## 2018-06-29 MED ORDER — LACTATED RINGERS IV SOLN
INTRAVENOUS | Status: DC
  Administered 2018-06-29 (×2): via INTRAVENOUS

## 2018-06-29 MED ORDER — SCOPOLAMINE 1 MG/3DAYS TD PT72: 1.0000 | MEDICATED_PATCH | Freq: Once | TRANSDERMAL | Status: DC | PRN

## 2018-06-29 MED ORDER — MIDAZOLAM HCL 2 MG/2ML IJ SOLN
1.0000 mg | INTRAMUSCULAR | Status: DC | PRN
  Administered 2018-06-29: 2 mg via INTRAVENOUS

## 2018-06-29 MED ORDER — FENTANYL CITRATE (PF) 100 MCG/2ML IJ SOLN: 25.0000 ug | INTRAMUSCULAR | Status: DC | PRN

## 2018-06-29 MED ORDER — OXYCODONE HCL 5 MG PO TABS: 5.0000 mg | ORAL_TABLET | Freq: Four times a day (QID) | ORAL | 0 refills | Status: DC | PRN

## 2018-06-29 MED ORDER — ONDANSETRON HCL 4 MG/2ML IJ SOLN
INTRAMUSCULAR | Status: AC
  Filled 2018-06-29: qty 8

## 2018-06-29 MED ORDER — PROPOFOL 10 MG/ML IV BOLUS
INTRAVENOUS | Status: DC | PRN
  Administered 2018-06-29: 200 mg via INTRAVENOUS

## 2018-06-29 MED ORDER — DEXAMETHASONE SODIUM PHOSPHATE 4 MG/ML IJ SOLN
INTRAMUSCULAR | Status: DC | PRN
  Administered 2018-06-29: 10 mg via INTRAVENOUS

## 2018-06-29 MED ORDER — ALBUTEROL SULFATE (2.5 MG/3ML) 0.083% IN NEBU
INHALATION_SOLUTION | RESPIRATORY_TRACT | Status: AC
  Filled 2018-06-29: qty 3

## 2018-06-29 MED ORDER — ALBUTEROL SULFATE (2.5 MG/3ML) 0.083% IN NEBU
2.5000 mg | INHALATION_SOLUTION | Freq: Four times a day (QID) | RESPIRATORY_TRACT | Status: DC | PRN
  Administered 2018-06-29: 2.5 mg via RESPIRATORY_TRACT

## 2018-06-29 MED ORDER — HEPARIN SODIUM (PORCINE) 5000 UNIT/ML IJ SOLN
INTRAMUSCULAR | Status: AC
  Filled 2018-06-29: qty 1

## 2018-06-29 MED ORDER — ONDANSETRON HCL 4 MG/2ML IJ SOLN: 4.0000 mg | Freq: Once | INTRAMUSCULAR | Status: DC | PRN

## 2018-06-29 MED ORDER — LACTATED RINGERS IV SOLN: INTRAVENOUS | Status: DC

## 2018-06-29 MED ORDER — ROCURONIUM BROMIDE 100 MG/10ML IV SOLN
INTRAVENOUS | Status: DC | PRN
  Administered 2018-06-29: 50 mg via INTRAVENOUS

## 2018-06-29 MED ORDER — BUPIVACAINE HCL (PF) 0.25 % IJ SOLN
INTRAMUSCULAR | Status: AC
  Filled 2018-06-29: qty 30

## 2018-06-29 SURGICAL SUPPLY — 39 items
ADH SKN CLS APL DERMABOND .7 (GAUZE/BANDAGES/DRESSINGS) ×1
BAG SPEC RTRVL LRG 6X4 10 (ENDOMECHANICALS)
CABLE HIGH FREQUENCY MONO STRZ (ELECTRODE) IMPLANT
CATH ROBINSON RED A/P 16FR (CATHETERS) IMPLANT
DERMABOND ADVANCED (GAUZE/BANDAGES/DRESSINGS) ×1
DERMABOND ADVANCED .7 DNX12 (GAUZE/BANDAGES/DRESSINGS) ×1 IMPLANT
DILATOR CANAL MILEX (MISCELLANEOUS) ×1 IMPLANT
DRSG OPSITE POSTOP 3X4 (GAUZE/BANDAGES/DRESSINGS) ×2 IMPLANT
DURAPREP 26ML APPLICATOR (WOUND CARE) ×2 IMPLANT
GLOVE BIO SURGEON STRL SZ8 (GLOVE) ×2 IMPLANT
GLOVE BIOGEL M 6.5 STRL (GLOVE) ×3 IMPLANT
GLOVE BIOGEL PI IND STRL 6.5 (GLOVE) ×2 IMPLANT
GLOVE BIOGEL PI IND STRL 7.0 (GLOVE) ×2 IMPLANT
GLOVE BIOGEL PI IND STRL 8.5 (GLOVE) IMPLANT
GLOVE BIOGEL PI INDICATOR 6.5 (GLOVE) ×2
GLOVE BIOGEL PI INDICATOR 7.0 (GLOVE) ×3
GLOVE BIOGEL PI INDICATOR 8.5 (GLOVE) ×1
GOWN STRL REUS W/TWL LRG LVL3 (GOWN DISPOSABLE) ×5 IMPLANT
HEMOSTAT ARISTA ABSORB 3G PWDR (MISCELLANEOUS) ×1 IMPLANT
NS IRRIG 1000ML POUR BTL (IV SOLUTION) IMPLANT
PACK LAPAROSCOPY BASIN (CUSTOM PROCEDURE TRAY) ×2 IMPLANT
PACK TRENDGUARD 450 HYBRID PRO (MISCELLANEOUS) IMPLANT
PACK TRENDGUARD 600 HYBRD PROC (MISCELLANEOUS) IMPLANT
POUCH SPECIMEN RETRIEVAL 10MM (ENDOMECHANICALS) IMPLANT
PROTECTOR NERVE ULNAR (MISCELLANEOUS) ×5 IMPLANT
SET IRRIG TUBING LAPAROSCOPIC (IRRIGATION / IRRIGATOR) ×1 IMPLANT
SHEARS HARMONIC ACE PLUS 36CM (ENDOMECHANICALS) ×1 IMPLANT
SLEEVE ENDOPATH XCEL 5M (ENDOMECHANICALS) ×1 IMPLANT
SOLUTION ELECTROLUBE (MISCELLANEOUS) ×1 IMPLANT
SUT VICRYL 0 UR6 27IN ABS (SUTURE) ×2 IMPLANT
SUT VICRYL 4-0 PS2 18IN ABS (SUTURE) ×2 IMPLANT
TOWEL OR 17X24 6PK STRL BLUE (TOWEL DISPOSABLE) ×2 IMPLANT
TRAY FOLEY W/BAG SLVR 14FR (SET/KITS/TRAYS/PACK) ×2 IMPLANT
TRENDGUARD 450 HYBRID PRO PACK (MISCELLANEOUS) ×2
TRENDGUARD 600 HYBRID PROC PK (MISCELLANEOUS)
TROCAR XCEL NON-BLD 11X100MML (ENDOMECHANICALS) ×2 IMPLANT
TROCAR XCEL NON-BLD 5MMX100MML (ENDOMECHANICALS) ×2 IMPLANT
TUBING INSUF HEATED (TUBING) ×2 IMPLANT
WARMER LAPAROSCOPE (MISCELLANEOUS) ×2 IMPLANT

## 2018-06-29 NOTE — Op Note (Signed)
06/29/2018  9:12 AM  PATIENT:  Carolyn CheshireKaren M Sleeper  37 y.o. female  PRE-OPERATIVE DIAGNOSIS:  N94.9 adnexal mass R10.2 pelvic pain  POST-OPERATIVE DIAGNOSIS:  N94.9 adnexal mass R10.2 pelvic pain  PROCEDURE:  Procedure(s): LAPAROSCOPIC OVARIAN CYSTECTOMY/Possible oophorectomy (Left)  SURGEON:  Surgeon(s) and Role:    Gerald Leitz* Mackenna Kamer, MD - Primary    * Myna Hidalgozan, Jennifer, DO - Assisting  PHYSICIAN ASSISTANT:   ASSISTANTS: Dr. Myna HidalgoJennifer Ozan assisted due to complexity of the surgery and concern for pelvic adhesive disease.    ANESTHESIA:   general  EBL:  25 mL   BLOOD ADMINISTERED:none  DRAINS: none   LOCAL MEDICATIONS USED:  MARCAINE     SPECIMEN:  Source of Specimen:  left ovarian cyst   DISPOSITION OF SPECIMEN:  PATHOLOGY  COUNTS:  YES  TOURNIQUET:  * No tourniquets in log *  DICTATION: .Dragon Dictation  PLAN OF CARE: Discharge to home after PACU  PATIENT DISPOSITION:  PACU - hemodynamically stable.   Delay start of Pharmacological VTE agent (>24 hrs) due to surgical blood loss or risk of bleeding: not applicable  Findings. Simple appearing 5 cm left ovarian cyst . Adhesions of the colon to the left pelvic side wall. Normal fallopian tubes bilaterally. Normal appearing right ovary. Normal appearing uterus.   Procedure: the patient was taken to the operating room placed under general anesthesia. Time Out was performed.  She was  Prepped and draped in the normal sterile fashion. A foley catheter was placed. A uterine manipulator was placed. Attention was turned to the abdomen where the umbilicus was injected with 10 cc of marcaine. A 10 mm trocar was placed under direct visualization. Pneumoperitoneum was achieved with C02 gas... A 5 mm trocar was placed in the right and left lower quadrants. Each trocar site was injected with 10 cc of marcaine prior to trocar placement. Pelvic washings were obtained.  The harmonic scalpel was used to excise  The left ovarian cyst  Was  removed through the  10 mm umbilical port.  Pt was noted to have bleeding from peritoneum in the posterior cul-de-sac. hemostasis was obtained with the Kleppinger.  The pelvis was irrigated. Arista was placed along the left adnexa and posterior cul-de-sac for additional hemostasis. Excellent hemostasis was noted. All trocars were removed under direct visualization . The pneumoperitoneum was released.  The fascia of the umbilical incision was re approximated with 0 vicryl. The skin incisions were closed with 4-0 vicryl and derma bond.  the patient was taken to the recovery room awake and in stable condition.  Sponge lap and needle counts were correct times 2.

## 2018-06-29 NOTE — H&P (Signed)
Date of Initial H&P: 06/29/2018 History reviewed, patient examined, no change in status, stable for surgery.

## 2018-06-29 NOTE — Discharge Instructions (Signed)
1,000mg  Tylenol given at 6:47am  Albuterol Treatment given at 10:00am  Post Anesthesia Home Care Instructions  Activity: Get plenty of rest for the remainder of the day. A responsible individual must stay with you for 24 hours following the procedure.  For the next 24 hours, DO NOT: -Drive a car -Advertising copywriterperate machinery -Drink alcoholic beverages -Take any medication unless instructed by your physician -Make any legal decisions or sign important papers.  Meals: Start with liquid foods such as gelatin or soup. Progress to regular foods as tolerated. Avoid greasy, spicy, heavy foods. If nausea and/or vomiting occur, drink only clear liquids until the nausea and/or vomiting subsides. Call your physician if vomiting continues.  Special Instructions/Symptoms: Your throat may feel dry or sore from the anesthesia or the breathing tube placed in your throat during surgery. If this causes discomfort, gargle with warm salt water. The discomfort should disappear within 24 hours.  If you had a scopolamine patch placed behind your ear for the management of post- operative nausea and/or vomiting:  1. The medication in the patch is effective for 72 hours, after which it should be removed.  Wrap patch in a tissue and discard in the trash. Wash hands thoroughly with soap and water. 2. You may remove the patch earlier than 72 hours if you experience unpleasant side effects which may include dry mouth, dizziness or visual disturbances. 3. Avoid touching the patch. Wash your hands with soap and water after contact with the patch.

## 2018-06-29 NOTE — Anesthesia Postprocedure Evaluation (Signed)
Anesthesia Post Note  Patient: Carolyn Castro  Procedure(s) Performed: LAPAROSCOPIC OVARIAN CYSTECTOMY/Possible oophorectomy (Left Abdomen)     Patient location during evaluation: PACU Anesthesia Type: General Level of consciousness: awake and alert Pain management: pain level controlled Vital Signs Assessment: post-procedure vital signs reviewed and stable Respiratory status: spontaneous breathing, nonlabored ventilation and respiratory function stable Cardiovascular status: blood pressure returned to baseline and stable Postop Assessment: no apparent nausea or vomiting Anesthetic complications: no    Last Vitals:  Vitals:   06/29/18 1015 06/29/18 1053  BP: (!) 142/86 133/84  Pulse: 91 89  Resp: 18 18  Temp:  37 C  SpO2: 94% 95%    Last Pain:  Vitals:   06/29/18 1053  TempSrc:   PainSc: 3                  Lucretia Kernarolyn E Witman

## 2018-06-29 NOTE — Transfer of Care (Signed)
Immediate Anesthesia Transfer of Care Note  Patient: Carolyn CheshireKaren M Clyne  Procedure(s) Performed: LAPAROSCOPIC OVARIAN CYSTECTOMY/Possible oophorectomy (Left Abdomen)  Patient Location: PACU  Anesthesia Type:General  Level of Consciousness: awake, alert , oriented and patient cooperative  Airway & Oxygen Therapy: Patient Spontanous Breathing and Patient connected to face mask oxygen  Post-op Assessment: Report given to RN and Post -op Vital signs reviewed and stable  Post vital signs: Reviewed and stable  Last Vitals:  Vitals Value Taken Time  BP 141/92 06/29/2018  9:19 AM  Temp    Pulse 105 06/29/2018  9:21 AM  Resp 22 06/29/2018  9:21 AM  SpO2 98 % 06/29/2018  9:21 AM  Vitals shown include unvalidated device data.  Last Pain:  Vitals:   06/29/18 0638  TempSrc: Oral  PainSc: 6       Patients Stated Pain Goal: 3 (06/29/18 [id number])  Complications: No apparent anesthesia complications

## 2018-06-29 NOTE — Anesthesia Procedure Notes (Signed)
Procedure Name: Intubation Date/Time: 06/29/2018 7:42 AM Performed by: Signe Colt, CRNA Pre-anesthesia Checklist: Patient identified, Emergency Drugs available, Suction available and Patient being monitored Patient Re-evaluated:Patient Re-evaluated prior to induction Oxygen Delivery Method: Circle system utilized Preoxygenation: Pre-oxygenation with 100% oxygen Induction Type: IV induction Ventilation: Mask ventilation without difficulty Laryngoscope Size: Mac and 3 Grade View: Grade I Tube type: Oral Tube size: 7.0 mm Number of attempts: 1 Airway Equipment and Method: Stylet and Oral airway Placement Confirmation: ETT inserted through vocal cords under direct vision,  positive ETCO2 and breath sounds checked- equal and bilateral Secured at: 21 cm Tube secured with: Tape Dental Injury: Teeth and Oropharynx as per pre-operative assessment

## 2018-07-02 ENCOUNTER — Encounter (HOSPITAL_BASED_OUTPATIENT_CLINIC_OR_DEPARTMENT_OTHER): Payer: Self-pay | Admitting: Obstetrics and Gynecology

## 2018-07-03 ENCOUNTER — Encounter (HOSPITAL_COMMUNITY): Payer: Self-pay

## 2018-07-03 ENCOUNTER — Observation Stay (HOSPITAL_COMMUNITY)
Admission: AD | Admit: 2018-07-03 | Discharge: 2018-07-05 | Disposition: A | Discharge status: Home / Self Care | Payer: 59 | Attending: Obstetrics and Gynecology | Admitting: Obstetrics and Gynecology

## 2018-07-03 DIAGNOSIS — T8149XA Infection following a procedure, other surgical site, initial encounter: Secondary | ICD-10-CM | POA: Diagnosis present

## 2018-07-03 DIAGNOSIS — L03316 Cellulitis of umbilicus: Secondary | ICD-10-CM | POA: Diagnosis not present

## 2018-07-03 DIAGNOSIS — J45909 Unspecified asthma, uncomplicated: Secondary | ICD-10-CM | POA: Insufficient documentation

## 2018-07-03 DIAGNOSIS — E119 Type 2 diabetes mellitus without complications: Secondary | ICD-10-CM | POA: Insufficient documentation

## 2018-07-03 DIAGNOSIS — Z79899 Other long term (current) drug therapy: Secondary | ICD-10-CM | POA: Diagnosis not present

## 2018-07-03 DIAGNOSIS — Z9889 Other specified postprocedural states: Secondary | ICD-10-CM | POA: Diagnosis present

## 2018-07-03 NOTE — MAU Note (Signed)
Pt here with c/o possible infection in umbilicus after laparoscopic surgery on 06/29/18.

## 2018-07-04 ENCOUNTER — Other Ambulatory Visit: Payer: Self-pay

## 2018-07-04 ENCOUNTER — Encounter (HOSPITAL_COMMUNITY): Payer: Self-pay | Admitting: Advanced Practice Midwife

## 2018-07-04 DIAGNOSIS — L03316 Cellulitis of umbilicus: Secondary | ICD-10-CM | POA: Diagnosis not present

## 2018-07-04 DIAGNOSIS — J45909 Unspecified asthma, uncomplicated: Secondary | ICD-10-CM | POA: Diagnosis not present

## 2018-07-04 DIAGNOSIS — E119 Type 2 diabetes mellitus without complications: Secondary | ICD-10-CM | POA: Diagnosis not present

## 2018-07-04 DIAGNOSIS — T8149XA Infection following a procedure, other surgical site, initial encounter: Secondary | ICD-10-CM | POA: Diagnosis present

## 2018-07-04 LAB — CBC WITH DIFFERENTIAL/PLATELET
Basophils Absolute: 0 10*3/uL (ref 0.0–0.1)
Basophils Relative: 0 %
Eosinophils Absolute: 0.2 10*3/uL (ref 0.0–0.5)
Eosinophils Relative: 3 %
HCT: 39.2 % (ref 36.0–46.0)
Hemoglobin: 12.6 g/dL (ref 12.0–15.0)
Lymphocytes Relative: 30 %
Lymphs Abs: 2.6 10*3/uL (ref 0.7–4.0)
MCH: 28.5 pg (ref 26.0–34.0)
MCHC: 32.1 g/dL (ref 30.0–36.0)
MCV: 88.7 fL (ref 80.0–100.0)
MONO ABS: 0.5 10*3/uL (ref 0.1–1.0)
Monocytes Relative: 6 %
Neutro Abs: 5.2 10*3/uL (ref 1.7–7.7)
Neutrophils Relative %: 61 %
Platelets: 371 10*3/uL (ref 150–400)
RBC: 4.42 MIL/uL (ref 3.87–5.11)
RDW: 14.1 % (ref 11.5–15.5)
WBC: 8.5 10*3/uL (ref 4.0–10.5)
nRBC: 0 % (ref 0.0–0.2)

## 2018-07-04 MED ORDER — LACTATED RINGERS IV SOLN: INTRAVENOUS | Status: DC

## 2018-07-04 MED ORDER — IBUPROFEN 600 MG PO TABS: 600.0000 mg | ORAL_TABLET | Freq: Four times a day (QID) | ORAL | Status: DC | PRN

## 2018-07-04 MED ORDER — SODIUM CHLORIDE 0.9 % IV SOLN
3.0000 g | Freq: Four times a day (QID) | INTRAVENOUS | Status: DC
  Administered 2018-07-04 (×2): 3 g via INTRAVENOUS
  Filled 2018-07-04 (×3): qty 3

## 2018-07-04 MED ORDER — PAROXETINE HCL 20 MG PO TABS
40.0000 mg | ORAL_TABLET | Freq: Every day | ORAL | Status: DC
  Administered 2018-07-04: 40 mg via ORAL
  Filled 2018-07-04 (×2): qty 2

## 2018-07-04 MED ORDER — LEVOTHYROXINE SODIUM 75 MCG PO TABS
75.0000 ug | ORAL_TABLET | Freq: Every day | ORAL | Status: DC
  Administered 2018-07-04 – 2018-07-05 (×2): 75 ug via ORAL
  Filled 2018-07-04 (×3): qty 1

## 2018-07-04 MED ORDER — OXYCODONE-ACETAMINOPHEN 5-325 MG PO TABS
1.0000 | ORAL_TABLET | ORAL | Status: DC | PRN
  Administered 2018-07-04: 1 via ORAL
  Administered 2018-07-04: 2 via ORAL
  Administered 2018-07-04 (×2): 1 via ORAL
  Administered 2018-07-04: 2 via ORAL
  Administered 2018-07-05 (×2): 1 via ORAL
  Filled 2018-07-04: qty 2
  Filled 2018-07-04: qty 1
  Filled 2018-07-04: qty 2
  Filled 2018-07-04 (×3): qty 1
  Filled 2018-07-04: qty 2

## 2018-07-04 MED ORDER — ZOLPIDEM TARTRATE 5 MG PO TABS
5.0000 mg | ORAL_TABLET | Freq: Every evening | ORAL | Status: DC | PRN
  Administered 2018-07-04: 5 mg via ORAL
  Filled 2018-07-04: qty 1

## 2018-07-04 MED ORDER — VANCOMYCIN HCL IN DEXTROSE 1-5 GM/200ML-% IV SOLN
1000.0000 mg | Freq: Three times a day (TID) | INTRAVENOUS | Status: AC
  Administered 2018-07-04 – 2018-07-05 (×2): 1000 mg via INTRAVENOUS
  Filled 2018-07-04 (×2): qty 200

## 2018-07-04 MED ORDER — CHLORHEXIDINE GLUCONATE CLOTH 2 % EX PADS
6.0000 | MEDICATED_PAD | Freq: Every day | CUTANEOUS | Status: DC
  Administered 2018-07-05: 6 via TOPICAL

## 2018-07-04 MED ORDER — ALBUTEROL SULFATE (2.5 MG/3ML) 0.083% IN NEBU
2.5000 mg | INHALATION_SOLUTION | Freq: Four times a day (QID) | RESPIRATORY_TRACT | Status: DC | PRN
  Administered 2018-07-04: 2.5 mg via RESPIRATORY_TRACT
  Filled 2018-07-04: qty 3

## 2018-07-04 MED ORDER — MUPIROCIN 2 % EX OINT
1.0000 "application " | TOPICAL_OINTMENT | Freq: Two times a day (BID) | CUTANEOUS | Status: DC
  Administered 2018-07-04 – 2018-07-05 (×3): 1 via NASAL
  Filled 2018-07-04: qty 22

## 2018-07-04 MED ORDER — ONDANSETRON HCL 4 MG PO TABS: 4.0000 mg | ORAL_TABLET | Freq: Four times a day (QID) | ORAL | Status: DC | PRN

## 2018-07-04 MED ORDER — LORATADINE 10 MG PO TABS
10.0000 mg | ORAL_TABLET | Freq: Every day | ORAL | Status: DC
  Administered 2018-07-04 – 2018-07-05 (×2): 10 mg via ORAL
  Filled 2018-07-04 (×3): qty 1

## 2018-07-04 MED ORDER — VANCOMYCIN HCL 10 G IV SOLR
1750.0000 mg | Freq: Once | INTRAVENOUS | Status: AC
  Administered 2018-07-04: 1750 mg via INTRAVENOUS
  Filled 2018-07-04: qty 1000

## 2018-07-04 MED ORDER — PRENATAL MULTIVITAMIN CH
1.0000 | ORAL_TABLET | Freq: Every day | ORAL | Status: DC
  Administered 2018-07-04 – 2018-07-05 (×2): 1 via ORAL
  Filled 2018-07-04 (×2): qty 1

## 2018-07-04 MED ORDER — ALPRAZOLAM 0.5 MG PO TABS
0.5000 mg | ORAL_TABLET | Freq: Two times a day (BID) | ORAL | Status: DC | PRN
  Administered 2018-07-04 – 2018-07-05 (×3): 0.5 mg via ORAL
  Filled 2018-07-04 (×3): qty 1

## 2018-07-04 MED ORDER — MONTELUKAST SODIUM 10 MG PO TABS
10.0000 mg | ORAL_TABLET | Freq: Every day | ORAL | Status: DC
  Administered 2018-07-04: 10 mg via ORAL
  Filled 2018-07-04 (×2): qty 1

## 2018-07-04 MED ORDER — ONDANSETRON HCL 4 MG/2ML IJ SOLN: 4.0000 mg | Freq: Four times a day (QID) | INTRAMUSCULAR | Status: DC | PRN

## 2018-07-04 MED ORDER — SODIUM CHLORIDE 0.9 % IV SOLN
INTRAVENOUS | Status: DC
  Administered 2018-07-04 – 2018-07-05 (×4): via INTRAVENOUS

## 2018-07-04 NOTE — Progress Notes (Addendum)
On am rounds pt c/o some chest tightness, consistent with her normal symptoms related to hx of asthma. Pt requesting albuterol neb which she uses at home. [facility], CNM notified, orders received. [place], CNM requested that this RN also follow up w/Dr. Richardson Dopp. Dr. Richardson Dopp notified at [phone], appreciated call, no new orders. RT notified of order and pt need. Pt in no apparent distress, sitting up eating breakfast, talking & smiling. Nursing will continue to monitor. Deberah Castle Warner  0825: Pt received albuterol nebulizer at this time by RT. I was in room, pt states that this works well for her and is feeling much improved after receiving dose of albuterol. Sheryn Bison

## 2018-07-04 NOTE — MAU Provider Note (Signed)
Chief Complaint: Wound Infection   First Provider Initiated Contact with Patient 07/04/18 0029     SUBJECTIVE HPI: Carolyn Castro is a 38 y.o. G0P0000 at 5 days S/P laparoscopic cystectomy on 06/29/18 performed by Dr. Richardson Dopp who presents to [facility] reporting pain, redness and drainage from umbilical incision. Drainage start ~3-4 days ago. Redness started 1-2 days ago.   Location: Umbilicus Quality: throbbing Severity: moderate Duration: 2 days Context: Post-op Timing: constant Modifying factors: Mild improvement w/ Oxy and IBU Associated signs and symptoms: Neg for fever, chills.   Past Medical History:  Diagnosis Date  . Anxiety   . Asthma   . Hypothyroid    OB History  Gravida Para Term Preterm AB Living  0 0 0 0 0 0  SAB TAB Ectopic Multiple Live Births  0 0 0 0 0   Past Surgical History:  Procedure Laterality Date  . COLONOSCOPY    . LAPAROSCOPIC OVARIAN CYSTECTOMY Left 06/29/2018   Procedure: LAPAROSCOPIC OVARIAN CYSTECTOMY/Possible oophorectomy;  Surgeon: Gerald Leitz, MD;  Location: [facility];  Service: Gynecology;  Laterality: Left;  . TONSILLECTOMY    . WISDOM TOOTH EXTRACTION     Social History   Socioeconomic History  . Marital status: Single    Spouse name: Not on file  . Number of children: Not on file  . Years of education: Not on file  . Highest education level: Not on file  Occupational History  . Not on file  Social Needs  . Financial resource strain: Not on file  . Food insecurity:    Worry: Not on file    Inability: Not on file  . Transportation needs:    Medical: Not on file    Non-medical: Not on file  Tobacco Use  . Smoking status: Never Smoker  . Smokeless tobacco: Never Used  Substance and Sexual Activity  . Alcohol use: Never    Frequency: Never  . Drug use: Never  . Sexual activity: Not on file  Lifestyle  . Physical activity:    Days per week: Not on file    Minutes per session: Not on file  .  Stress: Not on file  Relationships  . Social connections:    Talks on phone: Not on file    Gets together: Not on file    Attends religious service: Not on file    Active member of club or organization: Not on file    Attends meetings of clubs or organizations: Not on file    Relationship status: Not on file  . Intimate partner violence:    Fear of current or ex partner: Not on file    Emotionally abused: Not on file    Physically abused: Not on file    Forced sexual activity: Not on file  Other Topics Concern  . Not on file  Social History Narrative  . Not on file   History reviewed. No pertinent family history. No current facility-administered medications on file prior to encounter.    Current Outpatient Medications on File Prior to Encounter  Medication Sig Dispense Refill  . albuterol (ACCUNEB) 0.63 MG/3ML nebulizer solution Take 1 ampule by nebulization every 6 (six) hours as needed for wheezing.    Marland Kitchen ALPRAZolam (XANAX) 0.5 MG tablet Take 0.5 mg by mouth 2 (two) times daily as needed for anxiety.    . fexofenadine (ALLEGRA) 180 MG tablet Take 180 mg by mouth daily.    Marland Kitchen ibuprofen (ADVIL,MOTRIN) 800 MG tablet Take  1 tablet (800 mg total) by mouth every 8 (eight) hours as needed. 30 tablet 0  . levothyroxine (SYNTHROID, LEVOTHROID) 75 MCG tablet Take 75 mcg by mouth daily before breakfast.    . mometasone (ASMANEX) 220 MCG/INH inhaler Inhale 2 puffs into the lungs daily.    . mometasone (NASONEX) 50 MCG/ACT nasal spray Place 2 sprays into the nose daily.    . montelukast (SINGULAIR) 10 MG tablet Take 10 mg by mouth at bedtime.    Marland Kitchen. oxyCODONE (OXY IR/ROXICODONE) 5 MG immediate release tablet Take 1 tablet (5 mg total) by mouth every 6 (six) hours as needed for severe pain. 20 tablet 0  . PARoxetine (PAXIL) 20 MG tablet Take 2 tablets (40 mg total) by mouth daily. 180 tablet 0  . traMADol (ULTRAM) 50 MG tablet Take by mouth every 6 (six) hours as needed.     Allergies  Allergen  Reactions  . Ciprofloxacin Hcl Diarrhea    C Diff    I have reviewed patient's Past Medical Hx, Surgical Hx, Family Hx, Social Hx, medications and allergies.   Review of Systems  Constitutional: Negative for chills and fever.  Gastrointestinal: Positive for abdominal pain.  Skin: Positive for color change (erythema, warmth ). Negative for rash.    OBJECTIVE Patient Vitals for the past 24 hrs:  BP Temp Temp src Pulse Resp SpO2 Height Weight  07/03/18 2341 (!) 144/92 98.1 F (36.7 C) Oral 83 18 99 % - -  07/03/18 2335 - - - - - - 5\' 4"  (1.626 m) 118.4 kg   Constitutional: Well-developed, well-nourished female in no acute distress.  Cardiovascular: normal rate Respiratory: normal rate and effort.  GI: Abd soft. 12 x 15 cm area of erythema, warmth and mild tenderness surrounding and inferior to umbilicus. LLQ port site w/ 2 cm erythema and RLQ port site nml.  Neurologic: Alert and oriented x 4.  GU: Deferred.  LAB RESULTS Results for orders placed or performed during the hospital encounter of 07/03/18 (from the past 24 hour(s))  CBC with Differential/Platelet     Status: None   Collection Time: 07/03/18 11:54 PM  Result Value Ref Range   WBC 8.5 4.0 - 10.5 K/uL   RBC 4.42 3.87 - 5.11 MIL/uL   Hemoglobin 12.6 12.0 - 15.0 g/dL   HCT 16.139.2 09.636.0 - 04.546.0 %   MCV 88.7 80.0 - 100.0 fL   MCH 28.5 26.0 - 34.0 pg   MCHC 32.1 30.0 - 36.0 g/dL   RDW 40.914.1 81.111.5 - 91.415.5 %   Platelets 371 150 - 400 K/uL   nRBC 0.0 0.0 - 0.2 %   Neutrophils Relative % 61 %   Neutro Abs 5.2 1.7 - 7.7 K/uL   Lymphocytes Relative 30 %   Lymphs Abs 2.6 0.7 - 4.0 K/uL   Monocytes Relative 6 %   Monocytes Absolute 0.5 0.1 - 1.0 K/uL   Eosinophils Relative 3 %   Eosinophils Absolute 0.2 0.0 - 0.5 K/uL   Basophils Relative 0 %   Basophils Absolute 0.0 0.0 - 0.1 K/uL    IMAGING No results found.  MAU COURSE Orders Placed This Encounter  Procedures  . Aerobic Culture (superficial specimen)  . CBC with  Differential/Platelet  . Insert peripheral IV   MDM - Post-op cellulitis. Discussed w/ Dr. Mora ApplPinn who is covering for Dc. Cole. She recommends IV ABX and Zofran over night, Dr. Richardson Doppole in coming on in the morning and will round on her. Pt and family  are agreeable.    ASSESSMENT 1. Incisional infection     PLAN Obs on third floor for Dr. Mora Appl. Dr. Mora Appl assuming care of pt.  Katrinka Blazing, [place], CNM 07/04/2018  1:27 AM

## 2018-07-04 NOTE — Progress Notes (Addendum)
Subjective: Patient reports incisional pain, tolerating PO, + flatus and no problems voiding.   Pain is well controlled.  Objective: I have reviewed patient's vital signs, medications and labs.  General: alert, cooperative and no distress GI: soft appropriately tender nondistended +BS no rebound no guarding  Extremities: extremities normal, atraumatic, no cyanosis or edema incisions; Umbilicus with continued erythema .. it is slightly outside the marked area.. no  fresh drainage is noted from the umbilicus... dried discharge noted.    Assessment/Plan: Postoperative cellulitus - continue iv unasyn for 24 hours then discharge home on keflex for 7-10 days  Follow up in the office at scheduled visit 07/16/2018 sooner if infection dose not improve  Dr. Dion Body covering starting at 7 am tomorrow   LOS: 0 days    Gerald Leitz 07/04/2018, 11:46 AM   Spoke with pharmacist given the drainage from the umbilicus and rapid spreading of erythema plan to cover for MRSA.Marland Kitchen will d/c unasyn and start vancomycin 1 gram q 8 hours for 24 hours followed by bactrim DS outpatient for 7-10 days.

## 2018-07-04 NOTE — Progress Notes (Signed)
Contact precautions initiated per MD request. ABX transitioned to cover for potential MRSA until cx comes back. No active draining uncontrolled seen on my assessment this morning. Contact precautions initiated. Pt aware. Sheryn Bison

## 2018-07-04 NOTE — H&P (Addendum)
Carolyn Castro is an 38 y.o. female. S/p laparoscopic L ovarian cystectomy on 06/29/18 presenting to MAU for white discharge from umbilical incision with temperature of 99.7 while taking NSAIDs around the clock.       Past Medical History:  Diagnosis Date  . Anxiety   . Asthma   . Hypothyroid     Past Surgical History:  Procedure Laterality Date  . COLONOSCOPY    . LAPAROSCOPIC OVARIAN CYSTECTOMY Left 06/29/2018   Procedure: LAPAROSCOPIC OVARIAN CYSTECTOMY/Possible oophorectomy;  Surgeon: Gerald Leitz, MD;  Location: [facility];  Service: Gynecology;  Laterality: Left;  . TONSILLECTOMY    . WISDOM TOOTH EXTRACTION      History reviewed. No pertinent family history.  Social History:  reports that she has never smoked. She has never used smokeless tobacco. She reports that she does not drink alcohol or use drugs.  Allergies:  Allergies  Allergen Reactions  . Ciprofloxacin Hcl Diarrhea    C Diff    Medications Prior to Admission  Medication Sig Dispense Refill Last Dose  . albuterol (ACCUNEB) 0.63 MG/3ML nebulizer solution Take 1 ampule by nebulization every 6 (six) hours as needed for wheezing.   Past Week at Unknown time  . ALPRAZolam (XANAX) 0.5 MG tablet Take 0.5 mg by mouth 2 (two) times daily as needed for anxiety.   Past Week at Unknown time  . fexofenadine (ALLEGRA) 180 MG tablet Take 180 mg by mouth daily.   07/03/2018 at Unknown time  . ibuprofen (ADVIL,MOTRIN) 800 MG tablet Take 1 tablet (800 mg total) by mouth every 8 (eight) hours as needed. 30 tablet 0 07/03/2018 at Unknown time  . levothyroxine (SYNTHROID, LEVOTHROID) 75 MCG tablet Take 75 mcg by mouth daily before breakfast.   07/03/2018 at Unknown time  . mometasone (ASMANEX) 220 MCG/INH inhaler Inhale 2 puffs into the lungs daily.   07/03/2018 at Unknown time  . mometasone (NASONEX) 50 MCG/ACT nasal spray Place 2 sprays into the nose daily.   07/03/2018 at Unknown time  . montelukast  (SINGULAIR) 10 MG tablet Take 10 mg by mouth at bedtime.   07/03/2018 at Unknown time  . oxyCODONE (OXY IR/ROXICODONE) 5 MG immediate release tablet Take 1 tablet (5 mg total) by mouth every 6 (six) hours as needed for severe pain. 20 tablet 0 07/03/2018 at Unknown time  . PARoxetine (PAXIL) 20 MG tablet Take 2 tablets (40 mg total) by mouth daily. 180 tablet 0 07/03/2018 at Unknown time  . traMADol (ULTRAM) 50 MG tablet Take by mouth every 6 (six) hours as needed.   07/02/2018 at Unknown time    Review of Systems  Constitutional: Negative for chills, malaise/fatigue and weight loss.  HENT: Negative.   Eyes: Negative.   Respiratory: Negative.   Cardiovascular: Negative.   Gastrointestinal: Negative.   Genitourinary: Negative.   Musculoskeletal: Negative.   Skin: Positive for rash.  Neurological: Negative.   Endo/Heme/Allergies: Negative.   Psychiatric/Behavioral: Negative.     Blood pressure (!) 144/92, pulse 83, temperature 98.1 F (36.7 C), temperature source Oral, resp. rate 18, height 5\' 4"  (1.626 m), weight 118.4 kg, last menstrual period 06/21/2018, SpO2 99 %.   Vitals:   07/03/18 2335 07/03/18 2341  BP:  (!) 144/92  Pulse:  83  Resp:  18  Temp:  98.1 F (36.7 C)  TempSrc:  Oral  SpO2:  99%  Weight: 118.4 kg   Height: 5\' 4"  (1.626 m)     Physical Exam  Constitutional: She  is oriented to person, place, and time. She appears well-developed and well-nourished.  HENT:  Head: Normocephalic and atraumatic.  Neck: Normal range of motion. Neck supple.  Cardiovascular: Normal rate.  Respiratory: Effort normal.  GI: There is abdominal tenderness.  Musculoskeletal: Normal range of motion.  Neurological: She is alert and oriented to person, place, and time.  Skin: Skin is warm and dry.  Psychiatric: She has a normal mood and affect. Her behavior is normal. Judgment and thought content normal.  Abdomen has a red area surrounding umbilicus approximately 18 cm wide by 12 cm  long. Area is warm to the touch, and TTP. A crust has formed inside the navel from old drainage.   Results for orders placed or performed during the hospital encounter of 07/03/18 (from the past 24 hour(s))  CBC with Differential/Platelet     Status: None   Collection Time: 07/03/18 11:54 PM  Result Value Ref Range   WBC 8.5 4.0 - 10.5 K/uL   RBC 4.42 3.87 - 5.11 MIL/uL   Hemoglobin 12.6 12.0 - 15.0 g/dL   HCT 60.1 56.1 - 53.7 %   MCV 88.7 80.0 - 100.0 fL   MCH 28.5 26.0 - 34.0 pg   MCHC 32.1 30.0 - 36.0 g/dL   RDW 94.3 27.6 - 14.7 %   Platelets 371 150 - 400 K/uL   nRBC 0.0 0.0 - 0.2 %   Neutrophils Relative % 61 %   Neutro Abs 5.2 1.7 - 7.7 K/uL   Lymphocytes Relative 30 %   Lymphs Abs 2.6 0.7 - 4.0 K/uL   Monocytes Relative 6 %   Monocytes Absolute 0.5 0.1 - 1.0 K/uL   Eosinophils Relative 3 %   Eosinophils Absolute 0.2 0.0 - 0.5 K/uL   Basophils Relative 0 %   Basophils Absolute 0.0 0.0 - 0.1 K/uL     Assessment/Plan:  Incision infection:  Abdomen has a red area surrounding umbilicus approximately 18 cm wide by 12 cm long. Area is warm to the touch, and TTP. A crust has formed inside the navel from old drainage. Culture was sent. Area is outlined in marker as of 0122.  Per Dr. Mora Appl, admit pt for observation and her surgeon Dr. Richardson Dopp can evaluate this morning. Unasyn 3gm IV q 6h.  Jonetta Speak 07/04/2018, 1:16 AM     MD Addendum: I agree with above.  Patient was seen and examined soon after admission.  Patient in moderate pain so percocet was written for prn.  Cellulitis site at umbilicus large approximately 15cm in diameter and was marked.  Unasyn IV was initiated.   Carolyn Castro

## 2018-07-05 MED ORDER — SULFAMETHOXAZOLE-TRIMETHOPRIM 800-160 MG PO TABS
1.0000 | ORAL_TABLET | Freq: Two times a day (BID) | ORAL | Status: DC
  Administered 2018-07-05: 1 via ORAL
  Filled 2018-07-05 (×3): qty 1

## 2018-07-05 MED ORDER — SULFAMETHOXAZOLE-TRIMETHOPRIM 800-160 MG PO TABS: 1.0000 | ORAL_TABLET | Freq: Two times a day (BID) | ORAL | 0 refills | Status: AC

## 2018-07-05 NOTE — Plan of Care (Signed)
Patient discharged with antibiotic prescription, follow up appointment and s/s to f/u earlier with physician. Pt verbalizes understanding.

## 2018-07-05 NOTE — Discharge Summary (Signed)
Physician Discharge Summary  Patient ID: Carolyn Castro MRN: [id number] DOB/AGE: Mar 06, 1981 38 y.o.  Admit date: 07/03/2018 Discharge date: 07/05/2018  Admission Diagnoses:Cellulitis s/p Laparoscopy  Discharge Diagnoses:  Active Problems:   Infected incision Cellulitis  Discharged Condition: good  Hospital Course:  Pt has significant erythema and decreased by discharge.  Small amount of discharge.  No flucutulant areas.Vancomycin q 8 hours x 24 hours then Bactrim DS. Contact precautions.  Consults: Pharmacy  Significant Diagnostic Studies: labs:  CBC    Component Value Date/Time   WBC 8.5 07/03/2018 2354   RBC 4.42 07/03/2018 2354   HGB 12.6 07/03/2018 2354   HCT 39.2 07/03/2018 2354   PLT 371 07/03/2018 2354   MCV 88.7 07/03/2018 2354   MCH 28.5 07/03/2018 2354   MCHC 32.1 07/03/2018 2354   RDW 14.1 07/03/2018 2354   LYMPHSABS 2.6 07/03/2018 2354   MONOABS 0.5 07/03/2018 2354   EOSABS 0.2 07/03/2018 2354   BASOSABS 0.0 07/03/2018 2354    Treatments: antibiotics: Unasyn, vancomycin  X 24 hours  Discharge Exam: Blood pressure 139/78, pulse 78, temperature 98.6 F (37 C), temperature source Oral, resp. rate 16, height 5\' 4"  (1.626 m), weight 118.4 kg, last menstrual period 06/21/2018, SpO2 97 %. Gen:  NAD Abd:  Erythema around umbilicus.  Soft, nontender. Ext:  No edema or calf tenderness.  Disposition: Discharge disposition: 01-Home or Self Care       Discharge Instructions    Call MD for:  redness, tenderness, or signs of infection (pain, swelling, redness, odor or green/yellow discharge around incision site)   Complete by:  As directed    Call MD for:  severe uncontrolled pain   Complete by:  As directed    Call MD for:  temperature >100.4   Complete by:  As directed    Diet - low sodium heart healthy   Complete by:  As directed    Increase activity slowly   Complete by:  As directed      Allergies as of 07/05/2018      Reactions   Ciprofloxacin  Hcl Diarrhea   C Diff      Medication List    TAKE these medications   albuterol 0.63 MG/3ML nebulizer solution Commonly known as:  ACCUNEB Take 1 ampule by nebulization every 6 (six) hours as needed for wheezing.   ALPRAZolam 0.5 MG tablet Commonly known as:  XANAX Take 0.5 mg by mouth 2 (two) times daily as needed for anxiety.   fexofenadine 180 MG tablet Commonly known as:  ALLEGRA Take 180 mg by mouth daily.   ibuprofen 800 MG tablet Commonly known as:  ADVIL,MOTRIN Take 1 tablet (800 mg total) by mouth every 8 (eight) hours as needed. What changed:  reasons to take this   levothyroxine 75 MCG tablet Commonly known as:  SYNTHROID, LEVOTHROID Take 75 mcg by mouth daily before breakfast.   mometasone 220 MCG/INH inhaler Commonly known as:  ASMANEX Inhale 2 puffs into the lungs 2 (two) times daily.   mometasone 50 MCG/ACT nasal spray Commonly known as:  NASONEX Place 2 sprays into the nose daily.   montelukast 10 MG tablet Commonly known as:  SINGULAIR Take 10 mg by mouth at bedtime.   ondansetron 4 MG tablet Commonly known as:  ZOFRAN Take 4 mg by mouth every 8 (eight) hours as needed for nausea or vomiting.   oxyCODONE 5 MG immediate release tablet Commonly known as:  Oxy IR/ROXICODONE Take 1 tablet (5 mg total) by  mouth every 6 (six) hours as needed for severe pain.   PARoxetine 20 MG tablet Commonly known as:  PAXIL Take 2 tablets (40 mg total) by mouth daily.   sulfamethoxazole-trimethoprim 800-160 MG tablet Commonly known as:  BACTRIM DS,SEPTRA DS Take 1 tablet by mouth 2 (two) times daily for 10 days.   traMADol 50 MG tablet Commonly known as:  ULTRAM Take 50 mg by mouth every 6 (six) hours as needed for moderate pain.      Follow-up Information    Gerald Leitz, MD. Go on 07/16/2018.   Specialty:  Obstetrics and Gynecology Why:  Incision Check Contact information: [address] [place] [address] [phone]            Signed: Geryl Rankins 07/05/2018, 2:01 PM

## 2018-07-05 NOTE — Discharge Instructions (Signed)

## 2018-07-09 ENCOUNTER — Encounter (HOSPITAL_BASED_OUTPATIENT_CLINIC_OR_DEPARTMENT_OTHER): Payer: Self-pay | Admitting: Obstetrics and Gynecology

## 2018-07-09 LAB — AEROBIC CULTURE W GRAM STAIN (SUPERFICIAL SPECIMEN)

## 2018-07-12 ENCOUNTER — Ambulatory Visit: Payer: 59 | Admitting: Psychiatry

## 2018-07-18 ENCOUNTER — Inpatient Hospital Stay (HOSPITAL_COMMUNITY): Payer: 59

## 2018-07-18 ENCOUNTER — Encounter (HOSPITAL_COMMUNITY): Payer: Self-pay

## 2018-07-18 ENCOUNTER — Other Ambulatory Visit: Payer: Self-pay

## 2018-07-18 ENCOUNTER — Inpatient Hospital Stay (HOSPITAL_COMMUNITY)
Admission: AD | Admit: 2018-07-18 | Discharge: 2018-07-18 | Disposition: A | Discharge status: Home / Self Care | Payer: 59 | Attending: Obstetrics and Gynecology | Admitting: Obstetrics and Gynecology

## 2018-07-18 DIAGNOSIS — R5383 Other fatigue: Secondary | ICD-10-CM | POA: Diagnosis not present

## 2018-07-18 DIAGNOSIS — L03316 Cellulitis of umbilicus: Secondary | ICD-10-CM | POA: Insufficient documentation

## 2018-07-18 DIAGNOSIS — L03818 Cellulitis of other sites: Secondary | ICD-10-CM

## 2018-07-18 DIAGNOSIS — R1110 Vomiting, unspecified: Secondary | ICD-10-CM | POA: Diagnosis not present

## 2018-07-18 DIAGNOSIS — R109 Unspecified abdominal pain: Secondary | ICD-10-CM | POA: Insufficient documentation

## 2018-07-18 DIAGNOSIS — R197 Diarrhea, unspecified: Secondary | ICD-10-CM | POA: Insufficient documentation

## 2018-07-18 DIAGNOSIS — R300 Dysuria: Secondary | ICD-10-CM | POA: Diagnosis not present

## 2018-07-18 LAB — CBC WITH DIFFERENTIAL/PLATELET
Basophils Absolute: 0 10*3/uL (ref 0.0–0.1)
Basophils Relative: 0 %
EOS ABS: 0.1 10*3/uL (ref 0.0–0.5)
EOS PCT: 1 %
HCT: 38.8 % (ref 36.0–46.0)
Hemoglobin: 12.5 g/dL (ref 12.0–15.0)
Lymphocytes Relative: 33 %
Lymphs Abs: 2.1 10*3/uL (ref 0.7–4.0)
MCH: 28.6 pg (ref 26.0–34.0)
MCHC: 32.2 g/dL (ref 30.0–36.0)
MCV: 88.8 fL (ref 80.0–100.0)
MONO ABS: 0.5 10*3/uL (ref 0.1–1.0)
Monocytes Relative: 7 %
Neutro Abs: 3.7 10*3/uL (ref 1.7–7.7)
Neutrophils Relative %: 59 %
PLATELETS: 413 10*3/uL — AB (ref 150–400)
RBC: 4.37 MIL/uL (ref 3.87–5.11)
RDW: 14.7 % (ref 11.5–15.5)
WBC: 6.3 10*3/uL (ref 4.0–10.5)
nRBC: 0 % (ref 0.0–0.2)

## 2018-07-18 LAB — BASIC METABOLIC PANEL
Anion gap: 5 (ref 5–15)
BUN: 12 mg/dL (ref 6–20)
CO2: 28 mmol/L (ref 22–32)
CREATININE: 0.9 mg/dL (ref 0.44–1.00)
Calcium: 9.1 mg/dL (ref 8.9–10.3)
Chloride: 102 mmol/L (ref 98–111)
GFR calc Af Amer: >60 mL/min (ref >60)
GFR calc non Af Amer: >60 mL/min (ref >60)
Glucose, Bld: 95 mg/dL (ref 70–99)
Potassium: 4.6 mmol/L (ref 3.5–5.1)
SODIUM: 135 mmol/L (ref 135–145)

## 2018-07-18 LAB — URINALYSIS, ROUTINE W REFLEX MICROSCOPIC
Bilirubin Urine: NEGATIVE
Glucose, UA: NEGATIVE mg/dL
Hgb urine dipstick: NEGATIVE
KETONES UR: NEGATIVE mg/dL
Leukocytes, UA: NEGATIVE
Nitrite: NEGATIVE
Protein, ur: NEGATIVE mg/dL
Specific Gravity, Urine: 1.015 (ref 1.005–1.030)
pH: 5 (ref 5.0–8.0)

## 2018-07-18 LAB — POCT PREGNANCY, URINE: Preg Test, Ur: NEGATIVE

## 2018-07-18 MED ORDER — IOPAMIDOL (ISOVUE-300) INJECTION 61%
30.0000 mL | INTRAVENOUS | Status: AC
  Administered 2018-07-18: 30 mL via ORAL

## 2018-07-18 MED ORDER — IOPAMIDOL (ISOVUE-300) INJECTION 61%
100.0000 mL | Freq: Once | INTRAVENOUS | Status: AC | PRN
  Administered 2018-07-18: 100 mL via INTRAVENOUS

## 2018-07-18 MED ORDER — SULFAMETHOXAZOLE-TRIMETHOPRIM 800-160 MG PO TABS: 1.0000 | ORAL_TABLET | Freq: Two times a day (BID) | ORAL | 0 refills | Status: AC

## 2018-07-18 NOTE — MAU Provider Note (Addendum)
History     CSN: [id number]  Arrival date and time: 07/18/18 1500   None     Chief Complaint  Patient presents with  . Post-op Problem   HPI  38 y/o G0 presents complaining of umbilical and abdominal pain s/p laparoscopic left ovarian cystectomy on 06/29/2018. She was diagnosed with umbilical cellulitis on 07/03/2018 . She was admitted and received IV antibitoics Ancef x 2 does which was switched to Vancomycin IV for 24 hours to cover possible MRSA. Wound culture was negative for MRSA she was discharged home on 07/05/2018 on bactrim DS  Bid for 10 days. She reports nausea and emesis which started over the weekend after she completed the bactrim DS followed by increasing umbilical drainage and pain.  She was seen in the office 07/16/2018 the erythema from admission 06/30/2019 had improved slight erythema was noted at the umbilical incision. She was started on keflex 500 mg bid for 10 days. She denies temperature at home Tmax at home 99,8. She rates  The pain as a 4 out 10 at rest and 7 out of 10 with movement. She has taken Ibuprofen 800 mg every 6-8 hours for pain with reduction to 2 out of 10. Today the pain was so severe that she had difficulty moving. She reports 5 episodes of diarrhea in the last 24 hours.    Past Medical History:  Diagnosis Date  . Anxiety   . Asthma   . Hypothyroid     Past Surgical History:  Procedure Laterality Date  . COLONOSCOPY    . LAPAROSCOPIC OVARIAN CYSTECTOMY Left 06/29/2018   Procedure: LAPAROSCOPIC OVARIAN CYSTECTOMY;  Surgeon: Gerald Leitzole, Tara, MD;  Location: [facility];  Service: Gynecology;  Laterality: Left;  . TONSILLECTOMY    . WISDOM TOOTH EXTRACTION      History reviewed. No pertinent family history.  Social History   Tobacco Use  . Smoking status: Never Smoker  . Smokeless tobacco: Never Used  Substance Use Topics  . Alcohol use: Never    Frequency: Never  . Drug use: Never    Allergies:  Allergies  Allergen Reactions   . Ciprofloxacin Hcl Diarrhea    C Diff    Medications Prior to Admission  Medication Sig Dispense Refill Last Dose  . albuterol (ACCUNEB) 0.63 MG/3ML nebulizer solution Take 1 ampule by nebulization every 6 (six) hours as needed for wheezing.   Past Week at Unknown time  . ALPRAZolam (XANAX) 0.5 MG tablet Take 0.5 mg by mouth 2 (two) times daily as needed for anxiety.   Past Week at Unknown time  . fexofenadine (ALLEGRA) 180 MG tablet Take 180 mg by mouth daily.   07/03/2018 at Unknown time  . ibuprofen (ADVIL,MOTRIN) 800 MG tablet Take 1 tablet (800 mg total) by mouth every 8 (eight) hours as needed. (Patient taking differently: Take 800 mg by mouth every 8 (eight) hours as needed for moderate pain. ) 30 tablet 0 07/03/2018 at Unknown time  . levothyroxine (SYNTHROID, LEVOTHROID) 75 MCG tablet Take 75 mcg by mouth daily before breakfast.   07/03/2018 at Unknown time  . mometasone (ASMANEX) 220 MCG/INH inhaler Inhale 2 puffs into the lungs 2 (two) times daily.    07/03/2018 at Unknown time  . mometasone (NASONEX) 50 MCG/ACT nasal spray Place 2 sprays into the nose daily.   07/03/2018 at Unknown time  . montelukast (SINGULAIR) 10 MG tablet Take 10 mg by mouth at bedtime.   07/03/2018 at Unknown time  . ondansetron (ZOFRAN)  4 MG tablet Take 4 mg by mouth every 8 (eight) hours as needed for nausea or vomiting.   07/03/2018 at Unknown time  . oxyCODONE (OXY IR/ROXICODONE) 5 MG immediate release tablet Take 1 tablet (5 mg total) by mouth every 6 (six) hours as needed for severe pain. 20 tablet 0 07/03/2018 at Unknown time  . PARoxetine (PAXIL) 20 MG tablet Take 2 tablets (40 mg total) by mouth daily. 180 tablet 0 07/03/2018 at Unknown time  . traMADol (ULTRAM) 50 MG tablet Take 50 mg by mouth every 6 (six) hours as needed for moderate pain.    07/03/2018 at Unknown time    Review of Systems  Constitutional: Positive for activity change, appetite change, chills and fatigue. Negative for diaphoresis,  fever and unexpected weight change.  HENT: Negative.   Eyes: Negative.   Respiratory: Positive for chest tightness and shortness of breath. Negative for apnea, cough, choking, wheezing and stridor.   Cardiovascular: Negative.   Gastrointestinal: Positive for abdominal pain, diarrhea and nausea. Negative for abdominal distention, anal bleeding, blood in stool, constipation, rectal pain and vomiting.  Endocrine: Negative.   Genitourinary: Positive for dysuria. Negative for decreased urine volume, difficulty urinating, dyspareunia, enuresis, flank pain, frequency, genital sores, hematuria, menstrual problem, pelvic pain, urgency, vaginal bleeding, vaginal discharge and vaginal pain.  Musculoskeletal: Negative.   Skin: Positive for wound.  Allergic/Immunologic: Negative.   Neurological: Negative.   Hematological: Negative.   Psychiatric/Behavioral: Negative.    Physical Exam   Blood pressure 130/80, pulse 88, temperature 98 F (36.7 C), temperature source Oral, resp. rate 20, weight 118.7 kg, last menstrual period 06/21/2018, SpO2 96 %.  Physical Exam  Vitals reviewed. Constitutional: She is oriented to person, place, and time. She appears well-developed and well-nourished.  HENT:  Head: Normocephalic and atraumatic.  Eyes: Pupils are equal, round, and reactive to light.  Neck: Normal range of motion. Neck supple.  Cardiovascular: Normal rate and regular rhythm.  Respiratory: Effort normal and breath sounds normal. No respiratory distress. She has no wheezes.  GI: Soft. Bowel sounds are normal. She exhibits no distension and no mass. There is abdominal tenderness. There is no rebound and no guarding.  Musculoskeletal: Normal range of motion.        General: No edema.  Neurological: She is alert and oriented to person, place, and time.  Skin: Skin is warm and dry.  1 cm area of erythema surrounding the umbilicus   Psychiatric: She has a normal mood and affect.    MAU Course   Procedures  MDM CT SCAN pending   Results for orders placed or performed during the hospital encounter of 07/18/18 (from the past 24 hour(s))  Urinalysis, Routine w reflex microscopic     Status: None   Collection Time: 07/18/18  3:30 PM  Result Value Ref Range   Color, Urine YELLOW YELLOW   APPearance CLEAR CLEAR   Specific Gravity, Urine 1.015 1.005 - 1.030   pH 5.0 5.0 - 8.0   Glucose, UA NEGATIVE NEGATIVE mg/dL   Hgb urine dipstick NEGATIVE NEGATIVE   Bilirubin Urine NEGATIVE NEGATIVE   Ketones, ur NEGATIVE NEGATIVE mg/dL   Protein, ur NEGATIVE NEGATIVE mg/dL   Nitrite NEGATIVE NEGATIVE   Leukocytes, UA NEGATIVE NEGATIVE  CBC with Differential     Status: Abnormal   Collection Time: 07/18/18  3:31 PM  Result Value Ref Range   WBC 6.3 4.0 - 10.5 K/uL   RBC 4.37 3.87 - 5.11 MIL/uL   Hemoglobin 12.5  12.0 - 15.0 g/dL   HCT 17.0 01.7 - 49.4 %   MCV 88.8 80.0 - 100.0 fL   MCH 28.6 26.0 - 34.0 pg   MCHC 32.2 30.0 - 36.0 g/dL   RDW 49.6 75.9 - 16.3 %   Platelets 413 (H) 150 - 400 K/uL   nRBC 0.0 0.0 - 0.2 %   Neutrophils Relative % 59 %   Neutro Abs 3.7 1.7 - 7.7 K/uL   Lymphocytes Relative 33 %   Lymphs Abs 2.1 0.7 - 4.0 K/uL   Monocytes Relative 7 %   Monocytes Absolute 0.5 0.1 - 1.0 K/uL   Eosinophils Relative 1 %   Eosinophils Absolute 0.1 0.0 - 0.5 K/uL   Basophils Relative 0 %   Basophils Absolute 0.0 0.0 - 0.1 K/uL  Basic metabolic panel     Status: None   Collection Time: 07/18/18  3:31 PM  Result Value Ref Range   Sodium 135 135 - 145 mmol/L   Potassium 4.6 3.5 - 5.1 mmol/L   Chloride 102 98 - 111 mmol/L   CO2 28 22 - 32 mmol/L   Glucose, Bld 95 70 - 99 mg/dL   BUN 12 6 - 20 mg/dL   Creatinine, Ser 8.46 0.44 - 1.00 mg/dL   Calcium 9.1 8.9 - 65.9 mg/dL   GFR calc non Af Amer >60 >60 mL/min   GFR calc Af Amer >60 >60 mL/min   Anion gap 5 5 - 15  Pregnancy, urine POC     Status: None   Collection Time: 07/18/18  4:01 PM  Result Value Ref Range   Preg  Test, Ur NEGATIVE NEGATIVE    Assessment and Plan  1) umbilical cellulitis - this appears to have improved however pt still with significant pain and worsening fatigue- plan to d/c keflex and switch to bactrim DS bid for 7 days if CT scan is normal.  2) abdominal pain - CT scan to evaluate for peritonitis or abscess if present will admit overnight and consult Interventional Radiology  3) diarrhea - C diff culture pending.  4) dysuria - UA normal   Patient discussed with Dr. Myna Hidalgo who is covering until 7pm. Dr. Su Hilt to assume care after 7 pm  Gerald Leitz 07/18/2018, 3:34 PM   Report was to discharge patient if no abscess on CT Scan.  CT Scan neg for abscess and instructed to f/u in the office on Friday.  Discharged home on bactrim.  Pt unable to have BM in the MAU while there over 5hrs.  She was sent home with cup for collection if loose stools persist.

## 2018-07-18 NOTE — MAU Note (Signed)
Pt states she is post surgery. She states she is here for a CT scan.

## 2018-07-18 NOTE — MAU Note (Signed)
Pt unable to provide stool sample. Pt sent home with cup to collect sample per provider. Pt will take collected sample at home to follow up Dr. Appointment on Friday.

## 2018-07-21 DIAGNOSIS — R112 Nausea with vomiting, unspecified: Secondary | ICD-10-CM | POA: Diagnosis not present

## 2018-07-21 DIAGNOSIS — L03311 Cellulitis of abdominal wall: Secondary | ICD-10-CM | POA: Diagnosis not present

## 2018-07-26 ENCOUNTER — Encounter (HOSPITAL_BASED_OUTPATIENT_CLINIC_OR_DEPARTMENT_OTHER): Payer: 59 | Attending: Internal Medicine

## 2018-07-31 DIAGNOSIS — L03316 Cellulitis of umbilicus: Secondary | ICD-10-CM | POA: Diagnosis not present

## 2018-08-07 ENCOUNTER — Encounter (HOSPITAL_BASED_OUTPATIENT_CLINIC_OR_DEPARTMENT_OTHER): Payer: 59 | Attending: Internal Medicine

## 2018-08-07 DIAGNOSIS — Y838 Other surgical procedures as the cause of abnormal reaction of the patient, or of later complication, without mention of misadventure at the time of the procedure: Secondary | ICD-10-CM | POA: Insufficient documentation

## 2018-08-07 DIAGNOSIS — T8131XA Disruption of external operation (surgical) wound, not elsewhere classified, initial encounter: Secondary | ICD-10-CM | POA: Insufficient documentation

## 2018-08-07 DIAGNOSIS — T8189XA Other complications of procedures, not elsewhere classified, initial encounter: Secondary | ICD-10-CM | POA: Diagnosis not present

## 2018-08-08 DIAGNOSIS — J111 Influenza due to unidentified influenza virus with other respiratory manifestations: Secondary | ICD-10-CM | POA: Diagnosis not present

## 2018-08-08 DIAGNOSIS — J209 Acute bronchitis, unspecified: Secondary | ICD-10-CM | POA: Diagnosis not present

## 2018-08-08 DIAGNOSIS — J0100 Acute maxillary sinusitis, unspecified: Secondary | ICD-10-CM | POA: Diagnosis not present

## 2018-08-09 DIAGNOSIS — G4700 Insomnia, unspecified: Secondary | ICD-10-CM | POA: Diagnosis not present

## 2018-08-11 ENCOUNTER — Other Ambulatory Visit: Payer: Self-pay | Admitting: Psychiatry

## 2018-08-11 DIAGNOSIS — F4001 Agoraphobia with panic disorder: Secondary | ICD-10-CM

## 2018-08-11 DIAGNOSIS — F325 Major depressive disorder, single episode, in full remission: Secondary | ICD-10-CM

## 2018-08-12 ENCOUNTER — Inpatient Hospital Stay (HOSPITAL_COMMUNITY): Payer: 59

## 2018-08-12 ENCOUNTER — Encounter (HOSPITAL_COMMUNITY): Payer: Self-pay | Admitting: Emergency Medicine

## 2018-08-12 ENCOUNTER — Inpatient Hospital Stay (HOSPITAL_COMMUNITY)
Admission: AD | Admit: 2018-08-12 | Discharge: 2018-08-13 | Disposition: A | Discharge status: Home / Self Care | Payer: 59 | Source: Ambulatory Visit | Attending: Obstetrics and Gynecology | Admitting: Obstetrics and Gynecology

## 2018-08-12 DIAGNOSIS — G8929 Other chronic pain: Secondary | ICD-10-CM | POA: Diagnosis not present

## 2018-08-12 DIAGNOSIS — R1032 Left lower quadrant pain: Secondary | ICD-10-CM | POA: Insufficient documentation

## 2018-08-12 DIAGNOSIS — L03316 Cellulitis of umbilicus: Secondary | ICD-10-CM | POA: Insufficient documentation

## 2018-08-12 DIAGNOSIS — Z8742 Personal history of other diseases of the female genital tract: Secondary | ICD-10-CM

## 2018-08-12 DIAGNOSIS — Z9889 Other specified postprocedural states: Secondary | ICD-10-CM | POA: Diagnosis not present

## 2018-08-12 DIAGNOSIS — R109 Unspecified abdominal pain: Secondary | ICD-10-CM | POA: Diagnosis not present

## 2018-08-12 DIAGNOSIS — N83291 Other ovarian cyst, right side: Secondary | ICD-10-CM | POA: Diagnosis not present

## 2018-08-12 LAB — URINALYSIS, ROUTINE W REFLEX MICROSCOPIC
Bilirubin Urine: NEGATIVE
Glucose, UA: NEGATIVE mg/dL
KETONES UR: NEGATIVE mg/dL
Leukocytes, UA: NEGATIVE
Nitrite: NEGATIVE
PH: 6 (ref 5.0–8.0)
Protein, ur: NEGATIVE mg/dL
Specific Gravity, Urine: 1.01 (ref 1.005–1.030)

## 2018-08-12 LAB — URINALYSIS, MICROSCOPIC (REFLEX)

## 2018-08-12 MED ORDER — OXYCODONE HCL 5 MG PO TABS
5.0000 mg | ORAL_TABLET | Freq: Once | ORAL | Status: AC
  Administered 2018-08-13: 5 mg via ORAL
  Filled 2018-08-12: qty 1

## 2018-08-12 NOTE — MAU Provider Note (Signed)
History     CSN: [id number]  Arrival date and time: 08/12/18 2129   First Provider Initiated Contact with Patient 08/12/18 2243      Chief Complaint  Patient presents with  . Abdominal Pain   Carolyn CheshireKaren M Bottenfield is a 38 y.o female who presents for Abdominal Pain.  Patient states she went to the wound clinic on Tuesday and reports that she still has an opening in her umbilicus.  She was given packing dressing to put into the area and last changed the dressing today.  She reports that she is having pain that is "sharp and crampy" in her stomach.  She reports that it starts in her belly button and radiates out on both sides like a band.  She states that the pain is 8/10 and she has taken Tramadol (7pm) and Ibuprofen (11am) with no relief stating the pain rating was about the same.  She has not tried anything else for the pain and feels that it is consistent regardless of her activity.  She reports blood in her urine since Tuesday after initation of packing her wound.  She denies issues with urination, but does endorse a foul odor.  She denies vaginal issues including itching, burning, discharge, odor, and bleeding.  She also reports pain in her left ovary that feels like a "knife was stuck in it" and "the pain medicine is not touching that."  She reports the pain is constant and it is currently at a 9/10.  She endorses that she was experiencing the same type of pain prior to her cyctomy and it did resolve for about one week before returning.       OB History    Gravida  0   Para  0   Term  0   Preterm  0   AB  0   Living  0     SAB  0   TAB  0   Ectopic  0   Multiple  0   Live Births  0           Past Medical History:  Diagnosis Date  . Anxiety   . Asthma   . Hypothyroid     Past Surgical History:  Procedure Laterality Date  . COLONOSCOPY    . LAPAROSCOPIC OVARIAN CYSTECTOMY Left 06/29/2018   Procedure: LAPAROSCOPIC OVARIAN CYSTECTOMY;  Surgeon: Gerald Leitzole, Tara, MD;   Location: [facility];  Service: Gynecology;  Laterality: Left;  . TONSILLECTOMY    . WISDOM TOOTH EXTRACTION      No family history on file.  Social History   Tobacco Use  . Smoking status: Never Smoker  . Smokeless tobacco: Never Used  Substance Use Topics  . Alcohol use: Never    Frequency: Never  . Drug use: Never    Allergies:  Allergies  Allergen Reactions  . Ciprofloxacin Hcl Diarrhea    C Diff    Medications Prior to Admission  Medication Sig Dispense Refill Last Dose  . albuterol (ACCUNEB) 0.63 MG/3ML nebulizer solution Take 1 ampule by nebulization every 6 (six) hours as needed for wheezing.   08/12/2018 at Unknown time  . ALPRAZolam (XANAX) 0.5 MG tablet Take 0.5 mg by mouth 2 (two) times daily as needed for anxiety.   08/12/2018 at Unknown time  . CRANBERRY PO Take 1 capsule by mouth 2 (two) times daily.   08/12/2018 at Unknown time  . fexofenadine (ALLEGRA) 180 MG tablet Take 180 mg by mouth daily.  08/12/2018 at Unknown time  . ibuprofen (ADVIL,MOTRIN) 200 MG tablet Take 400-800 mg by mouth every 6 (six) hours as needed for mild pain.   Past Week at Unknown time  . levothyroxine (SYNTHROID, LEVOTHROID) 75 MCG tablet Take 75 mcg by mouth daily before breakfast.   08/12/2018 at Unknown time  . mometasone (ASMANEX) 220 MCG/INH inhaler Inhale 2 puffs into the lungs 2 (two) times daily.    08/12/2018 at Unknown time  . mometasone (NASONEX) 50 MCG/ACT nasal spray Place 2 sprays into the nose daily.   08/12/2018 at Unknown time  . montelukast (SINGULAIR) 10 MG tablet Take 10 mg by mouth at bedtime.   08/12/2018 at Unknown time  . ondansetron (ZOFRAN) 4 MG tablet Take 4 mg by mouth every 8 (eight) hours as needed for nausea or vomiting.   Past Week at Unknown time  . PARoxetine (PAXIL) 20 MG tablet Take 2 tablets (40 mg total) by mouth daily. 180 tablet 0 08/12/2018 at Unknown time  . traMADol (ULTRAM) 50 MG tablet Take 50 mg by mouth every 6 (six) hours as needed for  moderate pain.    08/12/2018 at Unknown time  . oxyCODONE (OXY IR/ROXICODONE) 5 MG immediate release tablet Take 1 tablet (5 mg total) by mouth every 6 (six) hours as needed for severe pain. 20 tablet 0 More than a month at Unknown time    Review of Systems  Constitutional: Positive for chills. Negative for fever.  Gastrointestinal: Positive for abdominal pain and nausea. Negative for constipation, diarrhea and vomiting.  Genitourinary: Positive for hematuria. Negative for difficulty urinating, urgency, vaginal bleeding, vaginal discharge and vaginal pain.  Neurological: Positive for dizziness, light-headedness and headaches.   Physical Exam   Blood pressure (!) 146/76, pulse (!) 106, temperature (!) 97.3 F (36.3 C), temperature source Oral, resp. rate 20, height 5\' 4"  (1.626 m), weight 121.1 kg, SpO2 100 %.  Physical Exam  Constitutional: She is oriented to person, place, and time. She appears well-developed and well-nourished. No distress.  HENT:  Head: Normocephalic and atraumatic.  Eyes: Conjunctivae are normal.  Neck: Normal range of motion.  Cardiovascular: Normal rate, regular rhythm and normal heart sounds.  Respiratory: Effort normal and breath sounds normal. No respiratory distress.  GI: Soft. Bowel sounds are normal. She exhibits no distension. There is abdominal tenderness in the periumbilical area and left lower quadrant. There is no guarding.  Large Panus Small incision noted within umbilicus.  Well healed, No apparent tenderness with manipulation.  No erythema or drainage.   Musculoskeletal: Normal range of motion.  Neurological: She is alert and oriented to person, place, and time.  Skin: Skin is warm and dry.  Psychiatric: She has a normal mood and affect. Her behavior is normal.    MAU Course  Procedures  Results for orders placed or performed during the hospital encounter of 08/12/18 (from the past 24 hour(s))  Urinalysis, Routine w reflex microscopic     Status:  Abnormal   Collection Time: 08/12/18 10:19 PM  Result Value Ref Range   Color, Urine YELLOW YELLOW   APPearance CLEAR CLEAR   Specific Gravity, Urine 1.010 1.005 - 1.030   pH 6.0 5.0 - 8.0   Glucose, UA NEGATIVE NEGATIVE mg/dL   Hgb urine dipstick TRACE (A) NEGATIVE   Bilirubin Urine NEGATIVE NEGATIVE   Ketones, ur NEGATIVE NEGATIVE mg/dL   Protein, ur NEGATIVE NEGATIVE mg/dL   Nitrite NEGATIVE NEGATIVE   Leukocytes, UA NEGATIVE NEGATIVE  Urinalysis, Microscopic (reflex)  Status: Abnormal   Collection Time: 08/12/18 10:19 PM  Result Value Ref Range   RBC / HPF 0-5 0 - 5 RBC/hpf   WBC, UA 0-5 0 - 5 WBC/hpf   Bacteria, UA RARE (A) Castro SEEN   Squamous Epithelial / LPF 0-5 0 - 5  CBC with Differential/Platelet     Status: Abnormal   Collection Time: 08/13/18 12:28 AM  Result Value Ref Range   WBC 12.1 (H) 4.0 - 10.5 K/uL   RBC 4.58 3.87 - 5.11 MIL/uL   Hemoglobin 13.2 12.0 - 15.0 g/dL   HCT 14.7 82.9 - 56.2 %   MCV 87.8 80.0 - 100.0 fL   MCH 28.8 26.0 - 34.0 pg   MCHC 32.8 30.0 - 36.0 g/dL   RDW 13.0 86.5 - 78.4 %   Platelets 391 150 - 400 K/uL   nRBC 0.0 0.0 - 0.2 %   Neutrophils Relative % 62 %   Neutro Abs 7.4 1.7 - 7.7 K/uL   Lymphocytes Relative 30 %   Lymphs Abs 3.6 0.7 - 4.0 K/uL   Monocytes Relative 8 %   Monocytes Absolute 1.0 0.1 - 1.0 K/uL   Eosinophils Relative 0 %   Eosinophils Absolute 0.0 0.0 - 0.5 K/uL   Basophils Relative 0 %   Basophils Absolute 0.0 0.0 - 0.1 K/uL  Basic metabolic panel     Status: Abnormal   Collection Time: 08/13/18 12:28 AM  Result Value Ref Range   Sodium 139 135 - 145 mmol/L   Potassium 3.9 3.5 - 5.1 mmol/L   Chloride 104 98 - 111 mmol/L   CO2 28 22 - 32 mmol/L   Glucose, Bld 63 (L) 70 - 99 mg/dL   BUN 14 6 - 20 mg/dL   Creatinine, Ser 6.96 0.44 - 1.00 mg/dL   Calcium 9.0 8.9 - 29.5 mg/dL   GFR calc non Af Amer >60 >60 mL/min   GFR calc Af Amer >60 >60 mL/min   Anion gap 7 5 - 15    FINDINGS: Uterus  Measurements:  5.5 x 3.8 x 4.6 cm = volume: 50 mL. No fibroids or other mass visualized.  Endometrium  Thickness: 6.2.  No focal abnormality visualized.  Right ovary  Measurements: 4.6 x 3.9 x 3.3 cm = volume: 31 mL. Multiple simple cysts measuring up to 3.4 x 2.4 x 1.5 cm.  Left ovary  Measurements: 2.1 x 2.1 x 2.0 cm = volume: 5 mL. Normal appearance/no adnexal mass.  Other findings  No abnormal free fluid.  IMPRESSION: 1. Simple right ovarian cysts measuring up to 3.4 cm. 2. No acute process is evident. Otherwise unremarkable pelvic ultrasound.  MDM Physical Exam Labs: UA, CBC with Diff, BMP Pelvic US Pain Meds Assessment and Plan  38 year old female LLQ Pain Umbilical Cellulitis  -Exam findings discussed. -Will send for Pelvic US to assess LLQ pain -Will give 5mg  oxycodone  -Labs to be drawn and sent -UA returns with trace blood, but otherwise insignificant -Will await results.    Follow Up 0045  -No significant findings for [place] or Labs -In room to discuss results with patient and father -Patient reports pain improved with oxycodone dosing. -Patient requests pain medication and instructed to continue Tramadol and Ibuprofen. -Encouraged to contact primary gyn for follow up regarding ongoing post op pain and discomfort as well as possible modifications and/or additions in pain medications. -Requests and given work excuse -No other questions or concerns. -Encouraged to call primary gyn or return  to MAU if symptoms worsen or with the onset of new symptoms. -Discharged to home in improved condition  Cherre Robins MSN, CNM 08/12/2018, 10:43 PM

## 2018-08-12 NOTE — MAU Note (Signed)
Pt here with c/o pain at umbilicus; had cyst removed from L ovary on 12/27; pain from that surgery. Some blood in urine occasionally.

## 2018-08-13 ENCOUNTER — Ambulatory Visit: Payer: 59 | Admitting: Psychiatry

## 2018-08-13 DIAGNOSIS — Z9889 Other specified postprocedural states: Secondary | ICD-10-CM

## 2018-08-13 DIAGNOSIS — G8929 Other chronic pain: Secondary | ICD-10-CM

## 2018-08-13 DIAGNOSIS — R1032 Left lower quadrant pain: Secondary | ICD-10-CM

## 2018-08-13 DIAGNOSIS — J018 Other acute sinusitis: Secondary | ICD-10-CM | POA: Diagnosis not present

## 2018-08-13 DIAGNOSIS — N83291 Other ovarian cyst, right side: Secondary | ICD-10-CM | POA: Diagnosis not present

## 2018-08-13 DIAGNOSIS — Z8742 Personal history of other diseases of the female genital tract: Secondary | ICD-10-CM

## 2018-08-13 LAB — CBC WITH DIFFERENTIAL/PLATELET
BASOS PCT: 0 %
Basophils Absolute: 0 10*3/uL (ref 0.0–0.1)
Eosinophils Absolute: 0 10*3/uL (ref 0.0–0.5)
Eosinophils Relative: 0 %
HEMATOCRIT: 40.2 % (ref 36.0–46.0)
Hemoglobin: 13.2 g/dL (ref 12.0–15.0)
Lymphocytes Relative: 30 %
Lymphs Abs: 3.6 10*3/uL (ref 0.7–4.0)
MCH: 28.8 pg (ref 26.0–34.0)
MCHC: 32.8 g/dL (ref 30.0–36.0)
MCV: 87.8 fL (ref 80.0–100.0)
Monocytes Absolute: 1 10*3/uL (ref 0.1–1.0)
Monocytes Relative: 8 %
Neutro Abs: 7.4 10*3/uL (ref 1.7–7.7)
Neutrophils Relative %: 62 %
Platelets: 391 10*3/uL (ref 150–400)
RBC: 4.58 MIL/uL (ref 3.87–5.11)
RDW: 14.8 % (ref 11.5–15.5)
WBC: 12.1 10*3/uL — ABNORMAL HIGH (ref 4.0–10.5)
nRBC: 0 % (ref 0.0–0.2)

## 2018-08-13 LAB — BASIC METABOLIC PANEL
ANION GAP: 7 (ref 5–15)
BUN: 14 mg/dL (ref 6–20)
CO2: 28 mmol/L (ref 22–32)
Calcium: 9 mg/dL (ref 8.9–10.3)
Chloride: 104 mmol/L (ref 98–111)
Creatinine, Ser: 0.98 mg/dL (ref 0.44–1.00)
GFR calc Af Amer: >60 mL/min (ref >60)
GFR calc non Af Amer: >60 mL/min (ref >60)
Glucose, Bld: 63 mg/dL — ABNORMAL LOW (ref 70–99)
Potassium: 3.9 mmol/L (ref 3.5–5.1)
Sodium: 139 mmol/L (ref 135–145)

## 2018-08-13 NOTE — Discharge Instructions (Signed)
Abdominal Pain, Adult  Abdominal pain can be caused by many things. Often, abdominal pain is not serious and it gets better with no treatment or by being treated at home. However, sometimes abdominal pain is serious. Your health care provider will do a medical history and a physical exam to try to determine the cause of your abdominal pain.  Follow these instructions at home:   Take over-the-counter and prescription medicines only as told by your health care provider. Do not take a laxative unless told by your health care provider.   Drink enough fluid to keep your urine clear or pale yellow.   Watch your condition for any changes.   Keep all follow-up visits as told by your health care provider. This is important.  Contact a health care provider if:   Your abdominal pain changes or gets worse.   You are not hungry or you lose weight without trying.   You are constipated or have diarrhea for more than 2-3 days.   You have pain when you urinate or have a bowel movement.   Your abdominal pain wakes you up at night.   Your pain gets worse with meals, after eating, or with certain foods.   You are throwing up and cannot keep anything down.   You have a fever.  Get help right away if:   Your pain does not go away as soon as your health care provider told you to expect.   You cannot stop throwing up.   Your pain is only in areas of the abdomen, such as the right side or the left lower portion of the abdomen.   You have bloody or black stools, or stools that look like tar.   You have severe pain, cramping, or bloating in your abdomen.   You have signs of dehydration, such as:  ? Dark urine, very little urine, or no urine.  ? Cracked lips.  ? Dry mouth.  ? Sunken eyes.  ? Sleepiness.  ? Weakness.  This information is not intended to replace advice given to you by your health care provider. Make sure you discuss any questions you have with your health care provider.  Document Released: 03/30/2005 Document  Revised: 01/08/2016 Document Reviewed: 12/02/2015  Elsevier Interactive Patient Education  2019 [employer]

## 2018-08-14 DIAGNOSIS — T8131XA Disruption of external operation (surgical) wound, not elsewhere classified, initial encounter: Secondary | ICD-10-CM | POA: Diagnosis not present

## 2018-08-14 DIAGNOSIS — T8189XA Other complications of procedures, not elsewhere classified, initial encounter: Secondary | ICD-10-CM | POA: Diagnosis not present

## 2018-08-15 ENCOUNTER — Emergency Department (HOSPITAL_BASED_OUTPATIENT_CLINIC_OR_DEPARTMENT_OTHER)
Admission: EM | Admit: 2018-08-15 | Discharge: 2018-08-15 | Disposition: A | Discharge status: Home / Self Care | Payer: 59 | Attending: Emergency Medicine | Admitting: Emergency Medicine

## 2018-08-15 ENCOUNTER — Encounter (HOSPITAL_BASED_OUTPATIENT_CLINIC_OR_DEPARTMENT_OTHER): Payer: Self-pay

## 2018-08-15 ENCOUNTER — Emergency Department (HOSPITAL_BASED_OUTPATIENT_CLINIC_OR_DEPARTMENT_OTHER): Payer: 59

## 2018-08-15 ENCOUNTER — Other Ambulatory Visit: Payer: Self-pay

## 2018-08-15 DIAGNOSIS — E039 Hypothyroidism, unspecified: Secondary | ICD-10-CM | POA: Diagnosis not present

## 2018-08-15 DIAGNOSIS — Z79899 Other long term (current) drug therapy: Secondary | ICD-10-CM | POA: Insufficient documentation

## 2018-08-15 DIAGNOSIS — F419 Anxiety disorder, unspecified: Secondary | ICD-10-CM | POA: Diagnosis not present

## 2018-08-15 DIAGNOSIS — R1084 Generalized abdominal pain: Secondary | ICD-10-CM | POA: Diagnosis not present

## 2018-08-15 DIAGNOSIS — D649 Anemia, unspecified: Secondary | ICD-10-CM | POA: Diagnosis not present

## 2018-08-15 DIAGNOSIS — N838 Other noninflammatory disorders of ovary, fallopian tube and broad ligament: Secondary | ICD-10-CM | POA: Diagnosis not present

## 2018-08-15 DIAGNOSIS — J45909 Unspecified asthma, uncomplicated: Secondary | ICD-10-CM | POA: Insufficient documentation

## 2018-08-15 LAB — CBC WITH DIFFERENTIAL/PLATELET
Abs Immature Granulocytes: 0.03 10*3/uL (ref 0.00–0.07)
Basophils Absolute: 0 10*3/uL (ref 0.0–0.1)
Basophils Relative: 0 %
Eosinophils Absolute: 0 10*3/uL (ref 0.0–0.5)
Eosinophils Relative: 0 %
HCT: 36.2 % (ref 36.0–46.0)
Hemoglobin: 11.5 g/dL — ABNORMAL LOW (ref 12.0–15.0)
Immature Granulocytes: 0 %
Lymphocytes Relative: 20 %
Lymphs Abs: 1.9 10*3/uL (ref 0.7–4.0)
MCH: 27.8 pg (ref 26.0–34.0)
MCHC: 31.8 g/dL (ref 30.0–36.0)
MCV: 87.4 fL (ref 80.0–100.0)
Monocytes Absolute: 0.7 10*3/uL (ref 0.1–1.0)
Monocytes Relative: 7 %
Neutro Abs: 7.1 10*3/uL (ref 1.7–7.7)
Neutrophils Relative %: 73 %
Platelets: 341 10*3/uL (ref 150–400)
RBC: 4.14 MIL/uL (ref 3.87–5.11)
RDW: 14.6 % (ref 11.5–15.5)
WBC: 9.8 10*3/uL (ref 4.0–10.5)
nRBC: 0 % (ref 0.0–0.2)

## 2018-08-15 LAB — PREGNANCY, URINE: Preg Test, Ur: NEGATIVE

## 2018-08-15 LAB — URINALYSIS, ROUTINE W REFLEX MICROSCOPIC
Bilirubin Urine: NEGATIVE
Glucose, UA: NEGATIVE mg/dL
Hgb urine dipstick: NEGATIVE
Ketones, ur: NEGATIVE mg/dL
LEUKOCYTE UA: NEGATIVE
Nitrite: NEGATIVE
Protein, ur: NEGATIVE mg/dL
Specific Gravity, Urine: <1.005 — ABNORMAL LOW (ref 1.005–1.030)
pH: 6.5 (ref 5.0–8.0)

## 2018-08-15 LAB — COMPREHENSIVE METABOLIC PANEL
ALT: 20 U/L (ref 0–44)
ANION GAP: 4 — AB (ref 5–15)
AST: 16 U/L (ref 15–41)
Albumin: 3.5 g/dL (ref 3.5–5.0)
Alkaline Phosphatase: 82 U/L (ref 38–126)
BUN: 13 mg/dL (ref 6–20)
CO2: 25 mmol/L (ref 22–32)
Calcium: 8.3 mg/dL — ABNORMAL LOW (ref 8.9–10.3)
Chloride: 102 mmol/L (ref 98–111)
Creatinine, Ser: 0.89 mg/dL (ref 0.44–1.00)
GFR calc Af Amer: >60 mL/min (ref >60)
GFR calc non Af Amer: >60 mL/min (ref >60)
Glucose, Bld: 103 mg/dL — ABNORMAL HIGH (ref 70–99)
POTASSIUM: 3.6 mmol/L (ref 3.5–5.1)
SODIUM: 131 mmol/L — AB (ref 135–145)
Total Bilirubin: 0.5 mg/dL (ref 0.3–1.2)
Total Protein: 6.8 g/dL (ref 6.5–8.1)

## 2018-08-15 LAB — LIPASE, BLOOD: Lipase: 24 U/L (ref 11–51)

## 2018-08-15 LAB — OCCULT BLOOD X 1 CARD TO LAB, STOOL: Fecal Occult Bld: NEGATIVE

## 2018-08-15 MED ORDER — ONDANSETRON 4 MG PO TBDP: 4.0000 mg | ORAL_TABLET | Freq: Three times a day (TID) | ORAL | 0 refills | Status: DC | PRN

## 2018-08-15 MED ORDER — ONDANSETRON HCL 4 MG/2ML IJ SOLN
4.0000 mg | Freq: Once | INTRAMUSCULAR | Status: AC
  Administered 2018-08-15: 4 mg via INTRAVENOUS
  Filled 2018-08-15: qty 2

## 2018-08-15 MED ORDER — SODIUM CHLORIDE 0.9 % IV BOLUS
1000.0000 mL | Freq: Once | INTRAVENOUS | Status: AC
  Administered 2018-08-15: 1000 mL via INTRAVENOUS

## 2018-08-15 MED ORDER — MORPHINE SULFATE (PF) 4 MG/ML IV SOLN
4.0000 mg | Freq: Once | INTRAVENOUS | Status: AC
  Administered 2018-08-15: 4 mg via INTRAVENOUS
  Filled 2018-08-15: qty 1

## 2018-08-15 MED ORDER — IOPAMIDOL (ISOVUE-300) INJECTION 61%
100.0000 mL | Freq: Once | INTRAVENOUS | Status: AC | PRN
  Administered 2018-08-15: 100 mL via INTRAVENOUS

## 2018-08-15 MED ORDER — SODIUM CHLORIDE 0.9 % IV BOLUS
500.0000 mL | Freq: Once | INTRAVENOUS | Status: AC
  Administered 2018-08-15: 500 mL via INTRAVENOUS

## 2018-08-15 MED ORDER — HYDROMORPHONE HCL 1 MG/ML IJ SOLN
1.0000 mg | Freq: Once | INTRAMUSCULAR | Status: AC
  Administered 2018-08-15: 1 mg via INTRAVENOUS
  Filled 2018-08-15: qty 1

## 2018-08-15 MED ORDER — DICYCLOMINE HCL 20 MG PO TABS: 20.0000 mg | ORAL_TABLET | Freq: Three times a day (TID) | ORAL | 0 refills | Status: DC | PRN

## 2018-08-15 NOTE — Discharge Instructions (Addendum)
You were seen in the emergency department today for abdominal pain and diarrhea.  Your work-up was overall reassuring.  Your CT scan showed that you have an ovarian cyst but was otherwise normal.  Your hemoglobin was a bit lower than usual at 11.5, this is and that should be rechecked by primary care provider.  Please follow the attached diet recommendations.  Take Bentyl every 8 hours as needed for abdominal discomfort/cramping, please also take Zofran every 8 hours as needed for nausea and vomiting.  We have prescribed you new medication(s) today. Discuss the medications prescribed today with your pharmacist as they can have adverse effects and interactions with your other medicines including over the counter and prescribed medications. Seek medical evaluation if you start to experience new or abnormal symptoms after taking one of these medicines, seek care immediately if you start to experience difficulty breathing, feeling of your throat closing, facial swelling, or rash as these could be indications of a more serious allergic reaction  Follow with your primary care provider within 3 days.  Return to the ER for new or worsening symptoms including but not limited to worsening pain, fever, inability to keep fluids down, additional bowel movements with blood in your stool, passing out, chest pain, trouble breathing, or any other concerns that you may have.

## 2018-08-15 NOTE — ED Provider Notes (Signed)
[facility] EMERGENCY DEPARTMENT Provider Note   CSN: [id number] Arrival date & time: 08/15/18  1228     History   Chief Complaint Chief Complaint  Patient presents with  . Diarrhea    HPI Carolyn Castro is a 38 y.o. female with a hx of anxiety, asthma, & hypothyroidism who presents to the ED with complaint of abdominal pain x 1 week. Pain is described as generalized, crampy, & progressively worsening. She has had some nausea without vomiting, & then today developed diarrhea. Reports 7-8 episodes of watery diarrhea throughout the day today. One episode appeared to be streaked with dark blood. No repetition of this. States she feels generally fatigued. No alleviating/aggravating factors. Denies fever, chills, vomiting, hematemesis, dysuria, vaginal bleeding, or vaginal discharge. She does have a hx of C.diff 10 years prior and states this feels somewhat similar. She reports she has been on 3 different abx in the past 1 month, on bactrim for cellulitis s/p laparotomy incision cellulitis as well as azithromycin & Augmentin for URI sxs- currently still taking Augmentin on day 3. No recent foreign travel.    HPI  Past Medical History:  Diagnosis Date  . Anxiety   . Asthma   . Hypothyroid     Patient Active Problem List   Diagnosis Date Noted  . Infected incision 07/04/2018  . Adnexal mass 06/29/2018  . Pelvic pain 06/29/2018    Past Surgical History:  Procedure Laterality Date  . COLONOSCOPY    . LAPAROSCOPIC OVARIAN CYSTECTOMY Left 06/29/2018   Procedure: LAPAROSCOPIC OVARIAN CYSTECTOMY;  Surgeon: Gerald Leitzole, Tara, MD;  Location: [facility];  Service: Gynecology;  Laterality: Left;  . TONSILLECTOMY    . WISDOM TOOTH EXTRACTION       OB History    Gravida  0   Para  0   Term  0   Preterm  0   AB  0   Living  0     SAB  0   TAB  0   Ectopic  0   Multiple  0   Live Births  0            Home Medications    Prior to Admission  medications   Medication Sig Start Date End Date Taking? Authorizing Provider  albuterol (ACCUNEB) 0.63 MG/3ML nebulizer solution Take 1 ampule by nebulization every 6 (six) hours as needed for wheezing.    [provider]  ALPRAZolam Prudy Feeler(XANAX) 0.5 MG tablet Take 0.5 mg by mouth 2 (two) times daily as needed for anxiety.    [provider]  CRANBERRY PO Take 1 capsule by mouth 2 (two) times daily.    [provider]  fexofenadine (ALLEGRA) 180 MG tablet Take 180 mg by mouth daily.    [provider]  ibuprofen (ADVIL,MOTRIN) 200 MG tablet Take 400-800 mg by mouth every 6 (six) hours as needed for mild pain.    [provider]  levothyroxine (SYNTHROID, LEVOTHROID) 75 MCG tablet Take 75 mcg by mouth daily before breakfast.    [provider]  mometasone (ASMANEX) 220 MCG/INH inhaler Inhale 2 puffs into the lungs 2 (two) times daily.     [provider]  mometasone (NASONEX) 50 MCG/ACT nasal spray Place 2 sprays into the nose daily.    [provider]  montelukast (SINGULAIR) 10 MG tablet Take 10 mg by mouth at bedtime.    [provider]  ondansetron (ZOFRAN) 4 MG tablet Take 4 mg by  mouth every 8 (eight) hours as needed for nausea or vomiting.    [provider]  PARoxetine (PAXIL) 20 MG tablet TAKE 2 TABLETS BY MOUTH EVERY DAY 08/13/18   Cottle, Steva Readyarey G Jr., MD  traMADol (ULTRAM) 50 MG tablet Take 50 mg by mouth every 6 (six) hours as needed for moderate pain.     [provider]    Family History No family history on file.  Social History Social History   Tobacco Use  . Smoking status: Never Smoker  . Smokeless tobacco: Never Used  Substance Use Topics  . Alcohol use: Never    Frequency: Never  . Drug use: Never     Allergies   Ciprofloxacin hcl   Review of Systems Review of Systems  Constitutional: Negative for chills and fever.  Respiratory: Negative for shortness of breath.     Cardiovascular: Negative for chest pain.  Gastrointestinal: Positive for abdominal pain, blood in stool, diarrhea and nausea. Negative for constipation and vomiting.  Genitourinary: Negative for dysuria, vaginal bleeding and vaginal discharge.  All other systems reviewed and are negative.    Physical Exam Updated Vital Signs BP 121/73 (BP Location: Left Arm)   Pulse (!) 101   Temp 97.6 F (36.4 C) (Oral)   Resp 20   Ht 5\' 4"  (1.626 m)   Wt 120.7 kg   LMP 07/26/2018 (Approximate)   SpO2 100%   BMI 45.66 kg/m   Physical Exam Vitals signs and nursing note reviewed.  Constitutional:      General: She is not in acute distress.    Appearance: She is well-developed. She is not toxic-appearing.  HENT:     Head: Normocephalic and atraumatic.  Eyes:     General:        Right eye: No discharge.        Left eye: No discharge.     Conjunctiva/sclera: Conjunctivae normal.  Neck:     Musculoskeletal: Neck supple.  Cardiovascular:     Rate and Rhythm: Normal rate and regular rhythm.  Pulmonary:     Effort: Pulmonary effort is normal. No respiratory distress.     Breath sounds: Normal breath sounds. No wheezing, rhonchi or rales.  Abdominal:     General: A surgical scar is present. Bowel sounds are normal. There is no distension.     Palpations: Abdomen is soft.     Tenderness: There is generalized abdominal tenderness. There is no right CVA tenderness, left CVA tenderness, guarding or rebound.     Comments: No erythema/purulence to healing surgical scars.   Skin:    General: Skin is warm and dry.     Findings: No rash.  Neurological:     Mental Status: She is alert.     Comments: Clear speech.   Psychiatric:        Behavior: Behavior normal.     ED Treatments / Results  Labs (all labs ordered are listed, but only abnormal results are displayed) Labs Reviewed  CBC WITH DIFFERENTIAL/PLATELET - Abnormal; Notable for the following components:      Result Value   Hemoglobin  11.5 (*)    All other components within normal limits  COMPREHENSIVE METABOLIC PANEL - Abnormal; Notable for the following components:   Sodium 131 (*)    Glucose, Bld 103 (*)    Calcium 8.3 (*)    Anion gap 4 (*)    All other components within normal limits  URINALYSIS, ROUTINE W REFLEX MICROSCOPIC - Abnormal; Notable  for the following components:   Specific Gravity, Urine <1.005 (*)    All other components within normal limits  C DIFFICILE QUICK SCREEN W PCR REFLEX  LIPASE, BLOOD  PREGNANCY, URINE  OCCULT BLOOD X 1 CARD TO LAB, STOOL    EKG None  Radiology Ct Abdomen Pelvis W Contrast  Result Date: 08/15/2018 CLINICAL DATA:  Abdominal pain EXAM: CT ABDOMEN AND PELVIS WITH CONTRAST TECHNIQUE: Multidetector CT imaging of the abdomen and pelvis was performed using the standard protocol following bolus administration of intravenous contrast. CONTRAST:  ISOVUE-300 IOPAMIDOL (ISOVUE-300) INJECTION 61% COMPARISON:  07/18/2018 FINDINGS: Lower chest: Dependent atelectasis in the lower lobes. Heart is normal size. No effusions. Hepatobiliary: No focal hepatic abnormality. Gallbladder unremarkable. Pancreas: No focal abnormality or ductal dilatation. Spleen: No focal abnormality.  Normal size. Adrenals/Urinary Tract: No adrenal abnormality. No focal renal abnormality. No stones or hydronephrosis. Urinary bladder is unremarkable. Stomach/Bowel: Appendix is normal. Stomach, large and small bowel grossly unremarkable. Vascular/Lymphatic: No evidence of aneurysm or adenopathy. Reproductive: There is a rounded cystic structure noted in the cul-de-sac in the midline measuring up to 4.2 cm. Previously, there was a right ovarian cyst in the right adnexal region. Left ovary unremarkable. Other: No free fluid or free air. Musculoskeletal: No acute bony abnormality. IMPRESSION: No acute findings in the abdomen or pelvis. 4.2 cm cyst in the cul-de-sac posterior to the uterus. While the previous ovarian  cyst on the right with in a different location, I favor this reflects a right ovarian cyst or paraovarian cyst. This has a benign appearance. Normal appendix. Electronically Signed   By: Charlett Nose M.D.   On: 08/15/2018 19:15    Procedures Procedures (including critical care time)  Medications Ordered in ED Medications  sodium chloride 0.9 % bolus 500 mL (0 mLs Intravenous Stopped 08/15/18 1653)  ondansetron (ZOFRAN) injection 4 mg (4 mg Intravenous Given 08/15/18 1527)  morphine 4 MG/ML injection 4 mg (4 mg Intravenous Given 08/15/18 1527)  HYDROmorphone (DILAUDID) injection 1 mg (1 mg Intravenous Given 08/15/18 1653)  sodium chloride 0.9 % bolus 1,000 mL (1,000 mLs Intravenous New Bag/Given 08/15/18 1653)  iopamidol (ISOVUE-300) 61 % injection 100 mL (100 mLs Intravenous Contrast Given 08/15/18 1816)     Initial Impression / Assessment and Plan / ED Course  I have reviewed the triage vital signs and the nursing notes.  Pertinent labs & imaging results that were available during my care of the patient were reviewed by me and considered in my medical decision making (see chart for details).   Patient presents to the ED with complaints of abdominal pain w/ diarrhea. Nontoxic appearing, in no apparent distress, vitals WNL with the exception of mild tachycardia normalized on my exam. On exam patient has mild generalized abdominal tenderness without peritoneal signs or focality. Will start w/ labs & trial analgesics, anti-emetics, & fluids.   On re-evaluation patient's pain is improved to a 4/10 in severity, remains with mild tenderness without peritoneal signs, proceeded with CT imaging.   Work-up reviewed:  CBC: No leukocytosis. Mild anemia w/ hgb of 11.5, somewhat decreased from prior on record CMP: Mild hyponatremia/hypocalcemia. LFTs & renal function preserved.  Lipase: WNL Preg test: negative Urinalysis: no UTI CT abdomen/pelvis: No acute findings in the abdomen or pelvis. 4.2 cm cyst  in the cul-de-sac posterior to the uterus. While the previous ovarian cyst on the right with in a different location, I favor this reflects a right ovarian cyst or paraovarian cyst. This has a  benign appearance. Normal appendix.  Repeat abdominal exam remains without peritoneal signs doubt obstruction, perforation, diverticulitis, appendicitis, cholecystitis, pancreatitis, PID, or ectopic pregnancy. Hgb did decreased mildly from prior but is overall reassuring, she has only had 1 BM with blood reported, fecal occult negative, no gross melena. Patient has been unable to provide stool sample in the ER for C. Diff testing therefore I feel this diagnosis is less likely. Possible viral GI illness, possible general med side effect. Tolerating PO. Discharge home with bentyl, zofran, & PCP Follow up. I discussed results, treatment plan, need for follow-up, and return precautions with the patient. Provided opportunity for questions, patient confirmed understanding and is in agreement with plan.   Findings and plan of care discussed with supervising physician Dr. Jacqulyn Bath who is in agreement.   Final Clinical Impressions(s) / ED Diagnoses   Final diagnoses:  Generalized abdominal pain  Anemia, unspecified type    ED Discharge Orders         Ordered    dicyclomine (BENTYL) 20 MG tablet  Every 8 hours PRN     08/15/18 1944    ondansetron (ZOFRAN ODT) 4 MG disintegrating tablet  Every 8 hours PRN     08/15/18 1944           Cherly Anderson, PA-C 08/15/18 1946    Maia Plan, MD 08/16/18 1038

## 2018-08-15 NOTE — ED Triage Notes (Signed)
Pt c.o diarrhea x today-states "some blood"-recent hx of 5 rounds abd since Dec 2019-NAD-steady gait

## 2018-08-17 DIAGNOSIS — L03311 Cellulitis of abdominal wall: Secondary | ICD-10-CM | POA: Diagnosis not present

## 2018-08-22 DIAGNOSIS — K219 Gastro-esophageal reflux disease without esophagitis: Secondary | ICD-10-CM | POA: Diagnosis not present

## 2018-08-22 DIAGNOSIS — G43109 Migraine with aura, not intractable, without status migrainosus: Secondary | ICD-10-CM | POA: Diagnosis not present

## 2018-08-23 ENCOUNTER — Ambulatory Visit: Payer: 59 | Admitting: Psychiatry

## 2018-08-23 ENCOUNTER — Encounter: Payer: Self-pay | Admitting: Psychiatry

## 2018-08-23 DIAGNOSIS — F4001 Agoraphobia with panic disorder: Secondary | ICD-10-CM

## 2018-08-23 DIAGNOSIS — F5105 Insomnia due to other mental disorder: Secondary | ICD-10-CM | POA: Diagnosis not present

## 2018-08-23 DIAGNOSIS — F325 Major depressive disorder, single episode, in full remission: Secondary | ICD-10-CM

## 2018-08-23 DIAGNOSIS — R1032 Left lower quadrant pain: Secondary | ICD-10-CM | POA: Diagnosis not present

## 2018-08-23 MED ORDER — ALPRAZOLAM 0.5 MG PO TABS: 0.5000 mg | ORAL_TABLET | Freq: Three times a day (TID) | ORAL | 1 refills | Status: DC | PRN

## 2018-08-23 MED ORDER — TRAZODONE HCL 50 MG PO TABS: 50.0000 mg | ORAL_TABLET | Freq: Every day | ORAL | 1 refills | Status: DC

## 2018-08-23 MED ORDER — PAROXETINE HCL 20 MG PO TABS: 60.0000 mg | ORAL_TABLET | Freq: Every day | ORAL | 0 refills | Status: DC

## 2018-08-23 NOTE — Progress Notes (Signed)
Carolyn Castro [id number] May 26, 1981 38 y.o.  Subjective:   Patient ID:  Carolyn Castro is a 38 y.o. (DOB [birth year]) female.  Chief Complaint:  Chief Complaint  Patient presents with  . Follow-up    Medication management  . Anxiety  . Sleeping Problem   Last seen May 17, 2018 HPI urgent appt. Carolyn Castro presents to the office today for follow-up of panic disorder and depression.  She got better after change at last visit from sertraline to paroxetine 40.  [place].  Surgery for ovarian cysts recently. This created more anxiety.  Complications of cellulitis.  3 rounds of antibiotics then wound clinic for weeks.  Still some ovarian pain and has another cyst discovered with pain.  Worked a lot better.  Feels less down.  Sleeps better.  Better motivation.  Less dread of work.  More normal interest and motivation and activity.  Lighter and more hopeful.  Panic reduced to 1/2 weeks.  Was having 3-4/week.  On paxil 40 for 4 weeks.  In persistent abdominal pain and can't work.  As a result anxiety has spiked.  Can't trun off her brain at night and hard time sleeping at night.  Feels she needs the Xanax increased and something at night to help calm down the worry.  During the day pleased with the Paxil.  Taking the Xanax mid day and pm.  Pending FU surgery.  Having panic attacks again at least twice daily.  Which worsens her asthma.  Gets in a frenzy with shakes and SOB and crying.  PCP gave her hydroxyzine 50 helped some, not fully satisfied with it.  Past psych meds:  Zoloft 200 NR, , paxil 40, Xanax. Review of Systems:  Review of Systems  Respiratory: Positive for shortness of breath and wheezing. Negative for cough and chest tightness.   Gastrointestinal: Positive for abdominal pain.  Neurological: Negative for tremors and weakness.  Psychiatric/Behavioral: Negative for agitation, behavioral problems, confusion, decreased concentration, dysphoric mood, hallucinations,  self-injury, sleep disturbance and suicidal ideas. The patient is not nervous/anxious and is not hyperactive.   asthma about the same.  Medications: I have reviewed the patient's current medications.  Current Outpatient Medications  Medication Sig Dispense Refill  . albuterol (ACCUNEB) 0.63 MG/3ML nebulizer solution Take 1 ampule by nebulization every 6 (six) hours as needed for wheezing.    Marland Kitchen ALPRAZolam (XANAX) 0.5 MG tablet Take 0.5 mg by mouth 2 (two) times daily as needed for anxiety.    Marland Kitchen CRANBERRY PO Take 1 capsule by mouth 2 (two) times daily.    . cyclobenzaprine (FLEXERIL) 10 MG tablet TK 1 T PO HS PRF MUSCLE SPASMS    . fexofenadine (ALLEGRA) 180 MG tablet Take 180 mg by mouth daily.    Marland Kitchen HYDROcodone-acetaminophen (NORCO/VICODIN) 5-325 MG tablet Take 1 tablet by mouth every 6 (six) hours as needed for moderate pain (for a few days).    Marland Kitchen ibuprofen (ADVIL,MOTRIN) 200 MG tablet Take 400-800 mg by mouth every 6 (six) hours as needed for mild pain.    Marland Kitchen levothyroxine (SYNTHROID, LEVOTHROID) 75 MCG tablet Take 75 mcg by mouth daily before breakfast.    . mometasone (ASMANEX) 220 MCG/INH inhaler Inhale 2 puffs into the lungs 2 (two) times daily.     . mometasone (NASONEX) 50 MCG/ACT nasal spray Place 2 sprays into the nose daily.    . montelukast (SINGULAIR) 10 MG tablet Take 10 mg by mouth at bedtime.    Marland Kitchen omeprazole (PRILOSEC) 20 MG  capsule     . ondansetron (ZOFRAN ODT) 4 MG disintegrating tablet Take 1 tablet (4 mg total) by mouth every 8 (eight) hours as needed for nausea or vomiting. 5 tablet 0  . ondansetron (ZOFRAN) 4 MG tablet Take 4 mg by mouth every 8 (eight) hours as needed for nausea or vomiting.    Marland Kitchen. PARoxetine (PAXIL) 20 MG tablet TAKE 2 TABLETS BY MOUTH EVERY DAY 180 tablet 0  . traMADol (ULTRAM) 50 MG tablet Take 50 mg by mouth every 6 (six) hours as needed for moderate pain.      No current facility-administered medications for this visit.     Medication Side Effects:  None  Allergies:  Allergies  Allergen Reactions  . Ciprofloxacin Hcl Diarrhea    C Diff    Past Medical History:  Diagnosis Date  . Anxiety   . Asthma   . Hypothyroid     History reviewed. No pertinent family history.  Social History   Socioeconomic History  . Marital status: Single    Spouse name: Not on file  . Number of children: Not on file  . Years of education: Not on file  . Highest education level: Not on file  Occupational History  . Not on file  Social Needs  . Financial resource strain: Not on file  . Food insecurity:    Worry: Not on file    Inability: Not on file  . Transportation needs:    Medical: Not on file    Non-medical: Not on file  Tobacco Use  . Smoking status: Never Smoker  . Smokeless tobacco: Never Used  Substance and Sexual Activity  . Alcohol use: Never    Frequency: Never  . Drug use: Never  . Sexual activity: Not Currently  Lifestyle  . Physical activity:    Days per week: Not on file    Minutes per session: Not on file  . Stress: Not on file  Relationships  . Social connections:    Talks on phone: Not on file    Gets together: Not on file    Attends religious service: Not on file    Active member of club or organization: Not on file    Attends meetings of clubs or organizations: Not on file    Relationship status: Not on file  . Intimate partner violence:    Fear of current or ex partner: Not on file    Emotionally abused: Not on file    Physically abused: Not on file    Forced sexual activity: Not on file  Other Topics Concern  . Not on file  Social History Narrative  . Not on file    Past Medical History, Surgical history, Social history, and Family history were reviewed and updated as appropriate.   Please see review of systems for further details on the patient's review from today.   Objective:   Physical Exam:  LMP 07/26/2018 (Approximate)   Physical Exam Constitutional:      General: She is not in acute  distress.    Appearance: She is well-developed. She is obese.  Musculoskeletal:        General: No deformity.  Neurological:     Mental Status: She is alert and oriented to person, place, and time.     Motor: No tremor.     Coordination: Coordination normal.     Gait: Gait normal.  Psychiatric:        Attention and Perception: She is attentive. She does  not perceive auditory hallucinations.        Mood and Affect: Mood is anxious. Mood is not depressed. Affect is not labile, blunt, angry or inappropriate.        Speech: Speech normal.        Behavior: Behavior normal.        Thought Content: Thought content normal. Thought content does not include homicidal or suicidal ideation. Thought content does not include homicidal or suicidal plan.        Cognition and Memory: Cognition normal.        Judgment: Judgment normal.     Comments: Insight intact. No auditory or visual hallucinations. Less depressed and less anxious. Anxiety more manageable with less Xananax.     Lab Review:     Component Value Date/Time   NA 131 (L) 08/15/2018 1523   K 3.6 08/15/2018 1523   CL 102 08/15/2018 1523   CO2 25 08/15/2018 1523   GLUCOSE 103 (H) 08/15/2018 1523   BUN 13 08/15/2018 1523   CREATININE 0.89 08/15/2018 1523   CALCIUM 8.3 (L) 08/15/2018 1523   PROT 6.8 08/15/2018 1523   ALBUMIN 3.5 08/15/2018 1523   AST 16 08/15/2018 1523   ALT 20 08/15/2018 1523   ALKPHOS 82 08/15/2018 1523   BILITOT 0.5 08/15/2018 1523   GFRNONAA >60 08/15/2018 1523   GFRAA >60 08/15/2018 1523       Component Value Date/Time   WBC 9.8 08/15/2018 1523   RBC 4.14 08/15/2018 1523   HGB 11.5 (L) 08/15/2018 1523   HCT 36.2 08/15/2018 1523   PLT 341 08/15/2018 1523   MCV 87.4 08/15/2018 1523   MCH 27.8 08/15/2018 1523   MCHC 31.8 08/15/2018 1523   RDW 14.6 08/15/2018 1523   LYMPHSABS 1.9 08/15/2018 1523   MONOABS 0.7 08/15/2018 1523   EOSABS 0.0 08/15/2018 1523   BASOSABS 0.0 08/15/2018 1523    No results  found for: POCLITH, LITHIUM   No results found for: PHENYTOIN, PHENOBARB, VALPROATE, CBMZ   .res Assessment: Plan:    Panic disorder with agoraphobia  Insomnia due to mental condition  Major depressive disorder with single episode, in full remission (HCC)   Greater than 50% of face to face time with patient was spent on counseling and coordination of care. We discussed the following.  History of severe panic disorder and mild to moderate major depression which did not respond to sertraline 200 mg a day.  She tolerated the switch to 40 mg paroxetine well.  She is had an excellent response so far with a dramatic reduction in panic attacks. With the recent worsening health problems she's had a resurgence of panic.  Will increase the paroxetine to greater than usual dose to 60 to try to limit the need of Bz.  She uses Xanax as needed and there is no evidence of abuse.  Will increase to TID and hope that we can reduce it again soon when the Paxil increase helps.  recommend drop Xanax dose as soon as the anxiety improves.  We discussed the short-term risks associated with benzodiazepines including sedation and increased fall risk among others.  Discussed long-term side effect risk including dependence, potential withdrawal symptoms, and the potential eventual dose-related risk of dementia.  Disc risk with opiates.  She is not planning on consistent opiates.  Disc risk of increasing the Xanax.  For sleep in hopes of limiting Xanax trial trazodone 50-100 mg HS.  This appointment was 15 minutes  Follow-up 6-8 weeks  Meredith Staggers MD, DFAPA Please see After Visit Summary for patient specific instructions.  No future appointments.  No orders of the defined types were placed in this encounter.     -------------------------------

## 2018-09-02 DIAGNOSIS — J019 Acute sinusitis, unspecified: Secondary | ICD-10-CM | POA: Diagnosis not present

## 2018-09-02 DIAGNOSIS — S39012A Strain of muscle, fascia and tendon of lower back, initial encounter: Secondary | ICD-10-CM | POA: Diagnosis not present

## 2018-09-03 ENCOUNTER — Encounter (HOSPITAL_BASED_OUTPATIENT_CLINIC_OR_DEPARTMENT_OTHER): Payer: Self-pay | Admitting: *Deleted

## 2018-09-03 ENCOUNTER — Other Ambulatory Visit: Payer: Self-pay

## 2018-09-03 ENCOUNTER — Emergency Department (HOSPITAL_BASED_OUTPATIENT_CLINIC_OR_DEPARTMENT_OTHER)
Admission: EM | Admit: 2018-09-03 | Discharge: 2018-09-03 | Disposition: A | Discharge status: Home / Self Care | Payer: 59 | Attending: Emergency Medicine | Admitting: Emergency Medicine

## 2018-09-03 DIAGNOSIS — E039 Hypothyroidism, unspecified: Secondary | ICD-10-CM | POA: Diagnosis not present

## 2018-09-03 DIAGNOSIS — M545 Low back pain, unspecified: Secondary | ICD-10-CM

## 2018-09-03 DIAGNOSIS — G8929 Other chronic pain: Secondary | ICD-10-CM | POA: Diagnosis not present

## 2018-09-03 DIAGNOSIS — Z79899 Other long term (current) drug therapy: Secondary | ICD-10-CM | POA: Diagnosis not present

## 2018-09-03 DIAGNOSIS — J45909 Unspecified asthma, uncomplicated: Secondary | ICD-10-CM | POA: Diagnosis not present

## 2018-09-03 DIAGNOSIS — M5441 Lumbago with sciatica, right side: Secondary | ICD-10-CM | POA: Diagnosis not present

## 2018-09-03 MED ORDER — PREDNISONE 10 MG (21) PO TBPK: ORAL_TABLET | ORAL | 0 refills | Status: DC

## 2018-09-03 MED ORDER — PREDNISONE 50 MG PO TABS
60.0000 mg | ORAL_TABLET | Freq: Once | ORAL | Status: AC
  Administered 2018-09-03: 60 mg via ORAL
  Filled 2018-09-03: qty 1

## 2018-09-03 MED ORDER — METHOCARBAMOL 500 MG PO TABS
500.0000 mg | ORAL_TABLET | Freq: Once | ORAL | Status: AC
  Administered 2018-09-03: 500 mg via ORAL
  Filled 2018-09-03: qty 1

## 2018-09-03 MED ORDER — METHOCARBAMOL 500 MG PO TABS: 500.0000 mg | ORAL_TABLET | Freq: Three times a day (TID) | ORAL | 0 refills | Status: DC | PRN

## 2018-09-03 MED ORDER — NAPROXEN 250 MG PO TABS
500.0000 mg | ORAL_TABLET | Freq: Once | ORAL | Status: AC
  Administered 2018-09-03: 500 mg via ORAL
  Filled 2018-09-03: qty 2

## 2018-09-03 MED ORDER — NAPROXEN 500 MG PO TABS: 500.0000 mg | ORAL_TABLET | Freq: Two times a day (BID) | ORAL | 0 refills | Status: DC

## 2018-09-03 NOTE — ED Triage Notes (Signed)
C/o lower back pain for past 3 years. States she has an appt with a specialist in the next couple weeks. 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 Describes pain as stabbing and throbbing. States pain radiates down her right leg. Denies any urinary incontinence.

## 2018-09-03 NOTE — ED Provider Notes (Signed)
TIME SEEN: 4:38 AM  CHIEF COMPLAINT: Back pain  HPI: Patient is a 38 year old female with history of hypothyroidism, asthma, anxiety, obesity who presents to the emergency department with lower back pain.  States she has had back pain for the past 3 years that have progressively worsened over the past year.  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  Denies any known injury.  No numbness, tingling or focal weakness.  Sometimes has an electric shooting pain that goes down the right leg.  No bowel or bladder incontinence.  No fever.  No history of diabetes, IV drug abuse, back surgeries, epidural injections.  Has a PCP.  States she plans to see a Writer in 2 weeks.  Has not had an MRI.  Has tried Flexeril, tramadol without relief.  ROS: See HPI Constitutional: no fever  Eyes: no drainage  ENT: no runny nose   Cardiovascular:  no chest pain  Resp: no SOB  GI: no vomiting GU: no dysuria Integumentary: no rash  Allergy: no hives  Musculoskeletal: no leg swelling  Neurological: no slurred speech ROS otherwise negative  PAST MEDICAL HISTORY/PAST SURGICAL HISTORY:  Past Medical History:  Diagnosis Date  . Anxiety   . Asthma   . Hypothyroid     MEDICATIONS:  Prior to Admission medications   Medication Sig Start Date End Date Taking? Authorizing Provider  norethindrone-ethinyl estradiol (MICROGESTIN,JUNEL,LOESTRIN) 1-20 MG-MCG tablet Take 1 tablet by mouth daily.   Yes [provider]  albuterol (ACCUNEB) 0.63 MG/3ML nebulizer solution Take 1 ampule by nebulization every 6 (six) hours as needed for wheezing.    [provider]  ALPRAZolam Prudy Feeler) 0.5 MG tablet Take 0.5 mg by mouth 2 (two) times daily as needed for anxiety.    [provider]  ALPRAZolam Prudy Feeler) 0.5 MG tablet Take 1 tablet (0.5 mg total) by mouth 3 (three) times daily as needed for anxiety. 08/23/18   Cottle, Steva Ready., MD  CRANBERRY PO Take 1 capsule by  mouth 2 (two) times daily.    [provider]  cyclobenzaprine (FLEXERIL) 10 MG tablet TK 1 T PO HS PRF MUSCLE SPASMS 08/01/18   [provider]  fexofenadine (ALLEGRA) 180 MG tablet Take 180 mg by mouth daily.    [provider]  HYDROcodone-acetaminophen (NORCO/VICODIN) 5-325 MG tablet Take 1 tablet by mouth every 6 (six) hours as needed for moderate pain (for a few days).    [provider]  ibuprofen (ADVIL,MOTRIN) 200 MG tablet Take 400-800 mg by mouth every 6 (six) hours as needed for mild pain.    [provider]  levothyroxine (SYNTHROID, LEVOTHROID) 75 MCG tablet Take 75 mcg by mouth daily before breakfast.    [provider]  mometasone (ASMANEX) 220 MCG/INH inhaler Inhale 2 puffs into the lungs 2 (two) times daily.     [provider]  mometasone (NASONEX) 50 MCG/ACT nasal spray Place 2 sprays into the nose daily.    [provider]  montelukast (SINGULAIR) 10 MG tablet Take 10 mg by mouth at bedtime.    [provider]  omeprazole (PRILOSEC) 20 MG capsule  08/22/18   [provider]  ondansetron (ZOFRAN ODT) 4 MG disintegrating tablet Take 1 tablet (4 mg total) by mouth every 8 (eight) hours as needed for nausea or vomiting. 08/15/18   Petrucelli, Samantha R, PA-C  ondansetron (ZOFRAN) 4 MG tablet Take 4 mg by mouth every 8 (eight) hours  as needed for nausea or vomiting.    [provider]  PARoxetine (PAXIL) 20 MG tablet TAKE 2 TABLETS BY MOUTH EVERY DAY 08/13/18   Cottle, Steva Ready., MD  PARoxetine (PAXIL) 20 MG tablet Take 3 tablets (60 mg total) by mouth daily. 08/23/18   Cottle, Steva Ready., MD  traMADol (ULTRAM) 50 MG tablet Take 50 mg by mouth every 6 (six) hours as needed for moderate pain.     [provider]  traZODone (DESYREL) 50 MG tablet Take 1-2 tablets (50-100 mg total) by mouth at bedtime. 08/23/18   Cottle, Steva Ready., MD    ALLERGIES:  Allergies  Allergen  Reactions  . Ciprofloxacin Hcl Diarrhea    C Diff    SOCIAL HISTORY:  Social History   Tobacco Use  . Smoking status: Never Smoker  . Smokeless tobacco: Never Used  Substance Use Topics  . Alcohol use: Never    Frequency: Never    FAMILY HISTORY: No family history on file.  EXAM: BP 110/75   Pulse 97   Temp 98.2 F (36.8 C)   Resp 20   Ht 5\' 4"  (1.626 m)   Wt 117.9 kg   LMP 08/23/2018 (Exact Date)   SpO2 100%   BMI 44.63 kg/m  CONSTITUTIONAL: Alert and oriented and responds appropriately to questions. Well-appearing; well-nourished, obese, afebrile, in no distress, smiling and laughing HEAD: Normocephalic EYES: Conjunctivae clear, pupils appear equal, EOMI ENT: normal nose; moist mucous membranes NECK: Supple, no meningismus, no nuchal rigidity, no LAD  CARD: RRR; S1 and S2 appreciated; no murmurs, no clicks, no rubs, no gallops RESP: Normal chest excursion without splinting or tachypnea; breath sounds clear and equal bilaterally; no wheezes, no rhonchi, no rales, no hypoxia or respiratory distress, speaking full sentences ABD/GI: Normal bowel sounds; non-distended; soft, non-tender, no rebound, no guarding, no peritoneal signs, no hepatosplenomegaly BACK:  The back appears normal and is under to palpation over the lower thoracic and lumbar paraspinal muscles bilaterally without redness, warmth, ecchymosis or swelling.  There is no midline spinal tenderness or step-off or deformity. EXT: Normal ROM in all joints; non-tender to palpation; no edema; normal capillary refill; no cyanosis, no calf tenderness or swelling    SKIN: Normal color for age and race; warm; no rash NEURO: Moves all extremities equally strength 5/5 in all 4 extremities, normal sensation diffusely, 2+ deep tendon reflexes in bilateral upper and lower extremities, no clonus, no saddle anesthesia PSYCH: The patient's mood and manner are appropriate. Grooming and personal hygiene are appropriate.  MEDICAL  DECISION MAKING: Patient here with complaints of back pain that have been ongoing for several years.  Complains of some radicular symptoms with shooting electric pains that go down the right leg.  No numbness or weakness, no incontinence.  No fever.  No red flag symptoms to suggest cauda equina, epidural abscess or hematoma, discitis or osteomyelitis, transverse myelitis.  Doubt fracture.  I do not feel she needs x-rays.  She has a PCP and I have encouraged her to follow-up with her primary doctor.  Recommended anti-inflammatories as well as a steroid taper for radicular symptoms.  We will try Robaxin for pain relief.  On review of the [place] controlled substance reporting system, it appears patient receives multiple controlled substances (see below).  I do not feel further narcotic pain medication is recommended at this time.  Recommended supportive care such as stretching, alternating heat and ice, weight loss, low impact exercise, referral to  physical therapy from her primary care doctor.  Patient and father agree with this plan.   08/24/2018  1   08/24/2018  Tramadol Hcl 50 MG Tablet  30.00 4 Ri Taa   [id number]   [facility] ([id number])   0  37.50 MME  Comm Ins   [place]  08/23/2018  1   08/23/2018  Alprazolam 0.5 MG Tablet  90.00 30 Ca Cot   [id number]   [facility] ([id number])   0  3.00 LME  Comm Ins   [place]  08/17/2018  1   08/17/2018  Hydrocodone-Acetamin 5-325 MG  40.00 5 Ca Web   [id number]   [facility] ([id number])   0  40.00 MME  Comm Ins   [place]  08/08/2018  1   08/08/2018  Codeine-Guaifen 10-100 Mg/5 Ml  160.00 4 Ph Ols   [id number]   [facility] ([id number])   0  12.00 MME  Comm Ins   [place]  07/23/2018  1   05/25/2018  Alprazolam 1 MG Tablet  30.00 30 Ca Web   [id number]   [facility] ([id number])   1  2.00 LME  Comm Ins   [place]  07/20/2018  1   06/19/2018  Tramadol Hcl 50 MG Tablet  60.00 30 Ca Web   [id number]   [facility] ([id number])   1  10.00 MME  Comm Ins   [place]  06/29/2018  1   06/29/2018  Oxycodone Hcl 5 MG Tablet  20.00 5 Ta Col   [id number]   [facility] ([id number])   0  30.00 MME  Comm Ins   [place]   06/24/2018  1   05/25/2018  Alprazolam 1 MG Tablet  30.00 30 Ca Web   [id number]   [facility] ([id number])   0  2.00 LME  Comm Ins   [place]  06/19/2018  1   06/19/2018  Oxycodone-Acetaminophen 5-325  20.00 3 Ta Col   [id number]   [facility] ([id number])   0  50.00 MME  Comm Ins   [place]  06/19/2018  1   06/19/2018  Tramadol Hcl 50 MG Tablet  60.00 30 Ca Web   [id number]   [facility] ([id number])   0  10.00 MME  Comm Ins   [place]  05/25/2018  1   05/25/2018  Alprazolam 1 MG Tablet  30.00 30 Ca Web   [id number]   [facility] ([id number])   0  2.00 LME  Comm Ins   [place]  05/20/2018  1   12/18/2017  Tramadol Hcl 50 MG Tablet  60.00 30 Ca Web   [id number]   [facility] ([id number])   4  10.00 MME  Comm Ins   [place]  05/11/2018  1   05/11/2018  Alprazolam 0.5 MG Tablet  20.00 10 Ca Web   [id number]   [facility] ([id number])   0  2.00 LME  Comm Ins   [place]  04/16/2018  1   10/31/2017  Alprazolam 0.25 MG Tablet  60.00 30 Ca Web   [id number]   [facility] ([id number])   5  1.00 LME  Comm Ins   [place]  04/16/2018  1   12/18/2017  Tramadol Hcl 50 MG Tablet  60.00 30 Ca Web   [id number]   [facility] ([id number])   3  10.00 MME  Comm Ins       At this time, I do not feel there is any life-threatening condition present. I have reviewed and discussed all results (EKG, imaging, lab, urine as appropriate) and exam findings with patient/family. I have reviewed nursing notes and  appropriate previous records.  I feel the patient is safe to be discharged home without further emergent workup and can continue workup as an outpatient as needed. Discussed usual and customary return precautions. Patient/family verbalize understanding and are comfortable with this plan.  Outpatient follow-up has been provided as needed. All questions have been answered.      Angla Delahunt, Layla Maw, DO 09/03/18 [phone]

## 2018-09-03 NOTE — ED Notes (Signed)
ED Provider at bedside. 

## 2018-09-04 ENCOUNTER — Emergency Department (HOSPITAL_COMMUNITY): Payer: 59

## 2018-09-04 ENCOUNTER — Observation Stay (HOSPITAL_COMMUNITY)
Admission: EM | Admit: 2018-09-04 | Discharge: 2018-09-06 | Disposition: A | Discharge status: Home / Self Care | Payer: 59 | Attending: Internal Medicine | Admitting: Internal Medicine

## 2018-09-04 ENCOUNTER — Other Ambulatory Visit: Payer: Self-pay | Admitting: Family Medicine

## 2018-09-04 ENCOUNTER — Encounter (HOSPITAL_COMMUNITY): Payer: Self-pay | Admitting: Emergency Medicine

## 2018-09-04 DIAGNOSIS — M545 Low back pain, unspecified: Secondary | ICD-10-CM | POA: Diagnosis present

## 2018-09-04 DIAGNOSIS — E039 Hypothyroidism, unspecified: Secondary | ICD-10-CM | POA: Diagnosis present

## 2018-09-04 DIAGNOSIS — Z79890 Hormone replacement therapy (postmenopausal): Secondary | ICD-10-CM | POA: Insufficient documentation

## 2018-09-04 DIAGNOSIS — G8929 Other chronic pain: Secondary | ICD-10-CM | POA: Insufficient documentation

## 2018-09-04 DIAGNOSIS — F419 Anxiety disorder, unspecified: Secondary | ICD-10-CM | POA: Diagnosis not present

## 2018-09-04 DIAGNOSIS — Z793 Long term (current) use of hormonal contraceptives: Secondary | ICD-10-CM | POA: Diagnosis not present

## 2018-09-04 DIAGNOSIS — J45909 Unspecified asthma, uncomplicated: Secondary | ICD-10-CM | POA: Insufficient documentation

## 2018-09-04 DIAGNOSIS — F329 Major depressive disorder, single episode, unspecified: Secondary | ICD-10-CM | POA: Insufficient documentation

## 2018-09-04 DIAGNOSIS — M25551 Pain in right hip: Secondary | ICD-10-CM | POA: Diagnosis not present

## 2018-09-04 DIAGNOSIS — W010XXA Fall on same level from slipping, tripping and stumbling without subsequent striking against object, initial encounter: Secondary | ICD-10-CM | POA: Insufficient documentation

## 2018-09-04 DIAGNOSIS — M546 Pain in thoracic spine: Secondary | ICD-10-CM | POA: Diagnosis not present

## 2018-09-04 DIAGNOSIS — S199XXA Unspecified injury of neck, initial encounter: Secondary | ICD-10-CM | POA: Diagnosis not present

## 2018-09-04 DIAGNOSIS — E669 Obesity, unspecified: Secondary | ICD-10-CM | POA: Diagnosis not present

## 2018-09-04 DIAGNOSIS — S0990XA Unspecified injury of head, initial encounter: Secondary | ICD-10-CM | POA: Diagnosis not present

## 2018-09-04 DIAGNOSIS — R51 Headache: Secondary | ICD-10-CM | POA: Diagnosis not present

## 2018-09-04 DIAGNOSIS — Z791 Long term (current) use of non-steroidal anti-inflammatories (NSAID): Secondary | ICD-10-CM | POA: Diagnosis not present

## 2018-09-04 DIAGNOSIS — M549 Dorsalgia, unspecified: Secondary | ICD-10-CM | POA: Diagnosis not present

## 2018-09-04 DIAGNOSIS — Z79899 Other long term (current) drug therapy: Secondary | ICD-10-CM | POA: Diagnosis not present

## 2018-09-04 DIAGNOSIS — R0902 Hypoxemia: Secondary | ICD-10-CM | POA: Diagnosis not present

## 2018-09-04 DIAGNOSIS — S299XXA Unspecified injury of thorax, initial encounter: Secondary | ICD-10-CM | POA: Diagnosis not present

## 2018-09-04 DIAGNOSIS — M5441 Lumbago with sciatica, right side: Secondary | ICD-10-CM

## 2018-09-04 DIAGNOSIS — S3992XA Unspecified injury of lower back, initial encounter: Secondary | ICD-10-CM | POA: Diagnosis not present

## 2018-09-04 DIAGNOSIS — D72829 Elevated white blood cell count, unspecified: Secondary | ICD-10-CM | POA: Insufficient documentation

## 2018-09-04 DIAGNOSIS — Z6841 Body Mass Index (BMI) 40.0 and over, adult: Secondary | ICD-10-CM | POA: Insufficient documentation

## 2018-09-04 DIAGNOSIS — S79911A Unspecified injury of right hip, initial encounter: Secondary | ICD-10-CM | POA: Diagnosis not present

## 2018-09-04 DIAGNOSIS — M25552 Pain in left hip: Secondary | ICD-10-CM | POA: Diagnosis not present

## 2018-09-04 LAB — BASIC METABOLIC PANEL
Anion gap: 8 (ref 5–15)
BUN: 13 mg/dL (ref 6–20)
CHLORIDE: 110 mmol/L (ref 98–111)
CO2: 19 mmol/L — ABNORMAL LOW (ref 22–32)
Calcium: 8.5 mg/dL — ABNORMAL LOW (ref 8.9–10.3)
Creatinine, Ser: 1.07 mg/dL — ABNORMAL HIGH (ref 0.44–1.00)
GFR calc Af Amer: >60 mL/min (ref >60)
GFR calc non Af Amer: >60 mL/min (ref >60)
Glucose, Bld: 124 mg/dL — ABNORMAL HIGH (ref 70–99)
Potassium: 4.5 mmol/L (ref 3.5–5.1)
Sodium: 137 mmol/L (ref 135–145)

## 2018-09-04 LAB — CBC WITH DIFFERENTIAL/PLATELET
Abs Immature Granulocytes: 0.11 10*3/uL — ABNORMAL HIGH (ref 0.00–0.07)
Basophils Absolute: 0 10*3/uL (ref 0.0–0.1)
Basophils Relative: 0 %
EOS ABS: 0 10*3/uL (ref 0.0–0.5)
Eosinophils Relative: 0 %
HCT: 40.3 % (ref 36.0–46.0)
Hemoglobin: 12.6 g/dL (ref 12.0–15.0)
IMMATURE GRANULOCYTES: 1 %
Lymphocytes Relative: 6 %
Lymphs Abs: 1 10*3/uL (ref 0.7–4.0)
MCH: 27.8 pg (ref 26.0–34.0)
MCHC: 31.3 g/dL (ref 30.0–36.0)
MCV: 89 fL (ref 80.0–100.0)
MONO ABS: 0.9 10*3/uL (ref 0.1–1.0)
Monocytes Relative: 5 %
Neutro Abs: 14.4 10*3/uL — ABNORMAL HIGH (ref 1.7–7.7)
Neutrophils Relative %: 88 %
Platelets: 387 10*3/uL (ref 150–400)
RBC: 4.53 MIL/uL (ref 3.87–5.11)
RDW: 15.4 % (ref 11.5–15.5)
WBC: 16.3 10*3/uL — ABNORMAL HIGH (ref 4.0–10.5)
nRBC: 0 % (ref 0.0–0.2)

## 2018-09-04 LAB — I-STAT BETA HCG BLOOD, ED (MC, WL, AP ONLY): I-stat hCG, quantitative: <5 m[IU]/mL (ref <5)

## 2018-09-04 MED ORDER — LORATADINE 10 MG PO TABS
10.0000 mg | ORAL_TABLET | Freq: Every day | ORAL | Status: DC
  Administered 2018-09-05 – 2018-09-06 (×2): 10 mg via ORAL
  Filled 2018-09-04 (×2): qty 1

## 2018-09-04 MED ORDER — ALPRAZOLAM 0.25 MG PO TABS
0.5000 mg | ORAL_TABLET | Freq: Once | ORAL | Status: AC
  Administered 2018-09-04: 0.5 mg via ORAL
  Filled 2018-09-04: qty 2

## 2018-09-04 MED ORDER — TRAZODONE HCL 50 MG PO TABS
50.0000 mg | ORAL_TABLET | Freq: Every day | ORAL | Status: DC
  Administered 2018-09-04: 100 mg via ORAL
  Administered 2018-09-05: 50 mg via ORAL
  Filled 2018-09-04: qty 1
  Filled 2018-09-04: qty 2

## 2018-09-04 MED ORDER — DOCUSATE SODIUM 100 MG PO CAPS
100.0000 mg | ORAL_CAPSULE | Freq: Two times a day (BID) | ORAL | Status: DC
  Administered 2018-09-04 – 2018-09-06 (×4): 100 mg via ORAL
  Filled 2018-09-04 (×4): qty 1

## 2018-09-04 MED ORDER — METHOCARBAMOL 500 MG PO TABS
500.0000 mg | ORAL_TABLET | Freq: Once | ORAL | Status: AC
  Administered 2018-09-04: 500 mg via ORAL
  Filled 2018-09-04: qty 1

## 2018-09-04 MED ORDER — ACETAMINOPHEN 650 MG RE SUPP: 650.0000 mg | Freq: Four times a day (QID) | RECTAL | Status: DC | PRN

## 2018-09-04 MED ORDER — ONDANSETRON HCL 4 MG/2ML IJ SOLN
4.0000 mg | Freq: Once | INTRAMUSCULAR | Status: AC
  Administered 2018-09-04: 4 mg via INTRAVENOUS
  Filled 2018-09-04: qty 2

## 2018-09-04 MED ORDER — METHOCARBAMOL 1000 MG/10ML IJ SOLN: 500.0000 mg | Freq: Once | INTRAVENOUS | Status: DC

## 2018-09-04 MED ORDER — GADOBUTROL 1 MMOL/ML IV SOLN
10.0000 mL | Freq: Once | INTRAVENOUS | Status: AC | PRN
  Administered 2018-09-04: 10 mL via INTRAVENOUS

## 2018-09-04 MED ORDER — FENTANYL CITRATE (PF) 100 MCG/2ML IJ SOLN
50.0000 ug | INTRAMUSCULAR | Status: DC | PRN
  Administered 2018-09-05: 50 ug via INTRAVENOUS
  Filled 2018-09-04: qty 2

## 2018-09-04 MED ORDER — ONDANSETRON HCL 4 MG PO TABS: 4.0000 mg | ORAL_TABLET | Freq: Four times a day (QID) | ORAL | Status: DC | PRN

## 2018-09-04 MED ORDER — ONDANSETRON HCL 4 MG/2ML IJ SOLN
4.0000 mg | Freq: Four times a day (QID) | INTRAMUSCULAR | Status: DC | PRN
  Administered 2018-09-05 (×2): 4 mg via INTRAVENOUS
  Filled 2018-09-04 (×2): qty 2

## 2018-09-04 MED ORDER — PANTOPRAZOLE SODIUM 40 MG PO TBEC
40.0000 mg | DELAYED_RELEASE_TABLET | Freq: Two times a day (BID) | ORAL | Status: DC
  Administered 2018-09-04 – 2018-09-06 (×4): 40 mg via ORAL
  Filled 2018-09-04 (×4): qty 1

## 2018-09-04 MED ORDER — PAROXETINE HCL 20 MG PO TABS
40.0000 mg | ORAL_TABLET | Freq: Every day | ORAL | Status: DC
  Administered 2018-09-05 – 2018-09-06 (×2): 40 mg via ORAL
  Filled 2018-09-04 (×2): qty 2

## 2018-09-04 MED ORDER — METHOCARBAMOL 500 MG PO TABS
500.0000 mg | ORAL_TABLET | Freq: Three times a day (TID) | ORAL | Status: DC | PRN
  Administered 2018-09-05: 500 mg via ORAL
  Filled 2018-09-04: qty 1

## 2018-09-04 MED ORDER — MOMETASONE FUROATE 220 MCG/INH IN AEPB: 2.0000 | INHALATION_SPRAY | Freq: Two times a day (BID) | RESPIRATORY_TRACT | Status: DC

## 2018-09-04 MED ORDER — ENOXAPARIN SODIUM 40 MG/0.4ML ~~LOC~~ SOLN
40.0000 mg | SUBCUTANEOUS | Status: DC
  Administered 2018-09-04 – 2018-09-05 (×2): 40 mg via SUBCUTANEOUS
  Filled 2018-09-04 (×2): qty 0.4

## 2018-09-04 MED ORDER — ACETAMINOPHEN 325 MG PO TABS: 650.0000 mg | ORAL_TABLET | Freq: Four times a day (QID) | ORAL | Status: DC | PRN

## 2018-09-04 MED ORDER — FENTANYL CITRATE (PF) 100 MCG/2ML IJ SOLN
100.0000 ug | Freq: Once | INTRAMUSCULAR | Status: AC
  Administered 2018-09-04: 100 ug via INTRAVENOUS
  Filled 2018-09-04: qty 2

## 2018-09-04 MED ORDER — DEXAMETHASONE SODIUM PHOSPHATE 10 MG/ML IJ SOLN
10.0000 mg | Freq: Once | INTRAMUSCULAR | Status: AC
  Administered 2018-09-04: 10 mg via INTRAVENOUS
  Filled 2018-09-04: qty 1

## 2018-09-04 MED ORDER — ALPRAZOLAM 0.5 MG PO TABS
0.5000 mg | ORAL_TABLET | Freq: Two times a day (BID) | ORAL | Status: DC | PRN
  Administered 2018-09-05 – 2018-09-06 (×2): 0.5 mg via ORAL
  Filled 2018-09-04 (×2): qty 1

## 2018-09-04 MED ORDER — BUDESONIDE 0.5 MG/2ML IN SUSP
0.5000 mg | Freq: Two times a day (BID) | RESPIRATORY_TRACT | Status: DC
  Administered 2018-09-05 – 2018-09-06 (×3): 0.5 mg via RESPIRATORY_TRACT
  Filled 2018-09-04 (×3): qty 2

## 2018-09-04 MED ORDER — MONTELUKAST SODIUM 10 MG PO TABS
10.0000 mg | ORAL_TABLET | Freq: Every evening | ORAL | Status: DC
  Administered 2018-09-04 – 2018-09-05 (×2): 10 mg via ORAL
  Filled 2018-09-04 (×2): qty 1

## 2018-09-04 MED ORDER — ALBUTEROL SULFATE (2.5 MG/3ML) 0.083% IN NEBU: 2.5000 mg | INHALATION_SOLUTION | Freq: Four times a day (QID) | RESPIRATORY_TRACT | Status: DC | PRN

## 2018-09-04 MED ORDER — MORPHINE SULFATE (PF) 4 MG/ML IV SOLN
4.0000 mg | Freq: Once | INTRAVENOUS | Status: AC
  Administered 2018-09-04: 4 mg via INTRAVENOUS
  Filled 2018-09-04: qty 1

## 2018-09-04 MED ORDER — LEVOTHYROXINE SODIUM 75 MCG PO TABS
75.0000 ug | ORAL_TABLET | Freq: Every day | ORAL | Status: DC
  Administered 2018-09-05 – 2018-09-06 (×2): 75 ug via ORAL
  Filled 2018-09-04 (×2): qty 1

## 2018-09-04 MED ORDER — HYDROMORPHONE HCL 1 MG/ML IJ SOLN
1.0000 mg | Freq: Once | INTRAMUSCULAR | Status: AC
  Administered 2018-09-04: 1 mg via INTRAVENOUS
  Filled 2018-09-04: qty 1

## 2018-09-04 NOTE — H&P (Signed)
History and Physical    Carolyn Castro:[id number] DOB: 28-Sep-1980 DOA: 09/04/2018  PCP: Shirlean Mylar, MD  Patient coming from: Home.  Chief Complaint: Low back pain.  HPI: Carolyn Castro is a 38 y.o. female with history of hypothyroidism, asthma, depression presents to the ER for the second time with low back pain.  Had come to the ER a day earlier with increasing low back pain was discharged home on pain relief medications.  Following which patient had a fall at home fell onto her buttocks and did hit her head.  No loss consciousness and following which patient was having severe pain and unable to walk.  Was brought to the ER.  ED Course: In the ER patient had CT head C-spine T-spine and L-spine which only showed severe L5-S1 neural foraminal narrowing otherwise nothing acute.  Since patient had increasing numbness and pain and weakness of the lower extremity patient had MRI done which shows L5-S1 neural foraminal narrowing.  Patient was given Decadron pain relief medications despite which patient's pain did not improve and has been admitted for further management.  Denies any incontinence of urine or bowel.  Labs show leukocytosis.  Patient is afebrile.  Review of Systems: As per HPI, rest all negative.   Past Medical History:  Diagnosis Date  . Anxiety   . Asthma   . Hypothyroid     Past Surgical History:  Procedure Laterality Date  . COLONOSCOPY    . LAPAROSCOPIC OVARIAN CYSTECTOMY Left 06/29/2018   Procedure: LAPAROSCOPIC OVARIAN CYSTECTOMY;  Surgeon: Gerald Leitz, MD;  Location: [facility];  Service: Gynecology;  Laterality: Left;  . TONSILLECTOMY    . WISDOM TOOTH EXTRACTION       reports that she has never smoked. She has never used smokeless tobacco. She reports that she does not drink alcohol or use drugs.  Allergies  Allergen Reactions  . Ciprofloxacin Hcl Diarrhea and Other (See Comments)    Developed C-Diff    History reviewed. No pertinent  family history.  Prior to Admission medications   Medication Sig Start Date End Date Taking? Authorizing Provider  acetaminophen (TYLENOL) 500 MG tablet Take 1,000 mg by mouth every 6 (six) hours as needed (for pain).    Yes [provider]  albuterol (ACCUNEB) 0.63 MG/3ML nebulizer solution Take 1 ampule by nebulization every 6 (six) hours as needed for wheezing.   Yes [provider]  albuterol (VENTOLIN HFA) 108 (90 Base) MCG/ACT inhaler Inhale 2 puffs into the lungs every 6 (six) hours as needed for wheezing or shortness of breath.   Yes [provider]  ALPRAZolam (XANAX) 0.5 MG tablet Take 1 tablet (0.5 mg total) by mouth 3 (three) times daily as needed for anxiety. Patient taking differently: Take 0.5 mg by mouth 2 (two) times daily as needed for anxiety.  08/23/18  Yes Cottle, Steva Ready., MD  CRANBERRY PO Take 1 tablet by mouth 2 (two) times daily.    Yes [provider]  diphenhydrAMINE (BENADRYL) 25 MG tablet Take 50 mg by mouth as needed for itching or allergies.    Yes [provider]  Docusate Sodium (STOOL SOFTENER) 100 MG capsule Take 100 mg by mouth 2 (two) times daily.   Yes [provider]  fexofenadine (ALLEGRA) 180 MG tablet Take 180 mg by mouth daily.   Yes [provider]  HYDROcodone-acetaminophen (NORCO/VICODIN) 5-325 MG tablet Take 1-2 tablets by mouth See admin instructions. Take 1-2 tablets by  mouth every six hours as needed for severe pain   Yes [provider]  ibuprofen (ADVIL,MOTRIN) 200 MG tablet Take 800 mg by mouth every 6 (six) hours as needed (for pain).    Yes [provider]  levothyroxine (SYNTHROID, LEVOTHROID) 75 MCG tablet Take 75 mcg by mouth daily before breakfast.   Yes [provider]  methocarbamol (ROBAXIN) 500 MG tablet Take 1 tablet (500 mg total) by mouth every 8 (eight) hours as needed for muscle spasms. 09/03/18  Yes Ward, Kristen N, DO  mometasone (ASMANEX)  220 MCG/INH inhaler Inhale 2 puffs into the lungs 2 (two) times daily.    Yes [provider]  mometasone (NASONEX) 50 MCG/ACT nasal spray Place 2 sprays into the nose daily.   Yes [provider]  montelukast (SINGULAIR) 10 MG tablet Take 10 mg by mouth every evening.    Yes [provider]  naproxen (NAPROSYN) 500 MG tablet Take 1 tablet (500 mg total) by mouth 2 (two) times daily. 09/03/18  Yes Ward, Layla Maw, DO  norethindrone-ethinyl estradiol (MICROGESTIN,JUNEL,LOESTRIN) 1-20 MG-MCG tablet Take 1 tablet by mouth daily.   Yes [provider]  omeprazole (PRILOSEC OTC) 20 MG tablet Take 20 mg by mouth 2 (two) times daily.    Yes [provider]  ondansetron (ZOFRAN ODT) 4 MG disintegrating tablet Take 1 tablet (4 mg total) by mouth every 8 (eight) hours as needed for nausea or vomiting. 08/15/18  Yes Petrucelli, Samantha R, PA-C  PARoxetine (PAXIL) 20 MG tablet Take 3 tablets (60 mg total) by mouth daily. Patient taking differently: Take 40 mg by mouth daily.  08/23/18  Yes Cottle, Steva Ready., MD  predniSONE (STERAPRED UNI-PAK 21 TAB) 10 MG (21) TBPK tablet Take as directed Patient taking differently: Take 10-60 mg by mouth See admin instructions. Take 60 mg by mouth on day one, then decrease by 10 mg daily until finished (6 days) 09/03/18  Yes Ward, Layla Maw, DO  traZODone (DESYREL) 50 MG tablet Take 1-2 tablets (50-100 mg total) by mouth at bedtime. 08/23/18  Yes Cottle, Steva Ready., MD  PARoxetine (PAXIL) 20 MG tablet TAKE 2 TABLETS BY MOUTH EVERY DAY Patient not taking: No sig reported 08/13/18   Lauraine Rinne., MD    Physical Exam: Vitals:   09/04/18 1512 09/04/18 1545 09/04/18 1700 09/04/18 1730  BP: (!) 142/94 (!) 136/94 (!) 136/93 (!) 133/91  Pulse: (!) 108 (!) 108    Resp: 15  (!) 28 (!) 23  Temp: 97.6 F (36.4 C)     TempSrc: Oral     SpO2: 100% 97%    Weight:      Height:          Constitutional: Moderately built and  nourished. Vitals:   09/04/18 1512 09/04/18 1545 09/04/18 1700 09/04/18 1730  BP: (!) 142/94 (!) 136/94 (!) 136/93 (!) 133/91  Pulse: (!) 108 (!) 108    Resp: 15  (!) 28 (!) 23  Temp: 97.6 F (36.4 C)     TempSrc: Oral     SpO2: 100% 97%    Weight:      Height:       Eyes: Anicteric no pallor. ENMT: No discharge from the ears eyes nose and mouth. Neck: No mass felt.  No neck rigidity. Respiratory: No rhonchi or crepitations. Cardiovascular: S1-S2 heard. Abdomen: Soft nontender bowel sounds present. Musculoskeletal: No edema.  No joint effusion. Skin: No rash. Neurologic: Alert awake oriented to time  place and person.  Moves all extremities 5 x 5.  No facial asymmetry tongue is midline.  Pain on moving both lower extremities.  Unable to move it up all the way.  Has good sensation. Psychiatric: Appears normal per normal affect.   Labs on Admission: I have personally reviewed following labs and imaging studies  CBC: Recent Labs  Lab 09/04/18 1532  WBC 16.3*  NEUTROABS 14.4*  HGB 12.6  HCT 40.3  MCV 89.0  PLT 387   Basic Metabolic Panel: Recent Labs  Lab 09/04/18 1532  NA 137  K 4.5  CL 110  CO2 19*  GLUCOSE 124*  BUN 13  CREATININE 1.07*  CALCIUM 8.5*   GFR: Estimated Creatinine Clearance: 89.7 mL/min (A) (by C-G formula based on SCr of 1.07 mg/dL (H)). Liver Function Tests: No results for input(s): AST, ALT, ALKPHOS, BILITOT, PROT, ALBUMIN in the last 168 hours. No results for input(s): LIPASE, AMYLASE in the last 168 hours. No results for input(s): AMMONIA in the last 168 hours. Coagulation Profile: No results for input(s): INR, PROTIME in the last 168 hours. Cardiac Enzymes: No results for input(s): CKTOTAL, CKMB, CKMBINDEX, TROPONINI in the last 168 hours. BNP (last 3 results) No results for input(s): PROBNP in the last 8760 hours. HbA1C: No results for input(s): HGBA1C in the last 72 hours. CBG: No results for input(s): GLUCAP in the last 168  hours. Lipid Profile: No results for input(s): CHOL, HDL, LDLCALC, TRIG, CHOLHDL, LDLDIRECT in the last 72 hours. Thyroid Function Tests: No results for input(s): TSH, T4TOTAL, FREET4, T3FREE, THYROIDAB in the last 72 hours. Anemia Panel: No results for input(s): VITAMINB12, FOLATE, FERRITIN, TIBC, IRON, RETICCTPCT in the last 72 hours. Urine analysis:    Component Value Date/Time   COLORURINE YELLOW 08/15/2018 1523   APPEARANCEUR CLEAR 08/15/2018 1523   LABSPEC <1.005 (L) 08/15/2018 1523   PHURINE 6.5 08/15/2018 1523   GLUCOSEU NEGATIVE 08/15/2018 1523   HGBUR NEGATIVE 08/15/2018 1523   BILIRUBINUR NEGATIVE 08/15/2018 1523   KETONESUR NEGATIVE 08/15/2018 1523   PROTEINUR NEGATIVE 08/15/2018 1523   NITRITE NEGATIVE 08/15/2018 1523   LEUKOCYTESUR NEGATIVE 08/15/2018 1523   Sepsis Labs: @LABRCNTIP (procalcitonin:4,lacticidven:4) )No results found for this or any previous visit (from the past 240 hour(s)).   Radiological Exams on Admission: Ct Head Wo Contrast  Result Date: 09/04/2018 CLINICAL DATA:  Fall EXAM: CT HEAD WITHOUT CONTRAST TECHNIQUE: Contiguous axial images were obtained from the base of the skull through the vertex without intravenous contrast. COMPARISON:  None. FINDINGS: Brain: No evidence of acute infarction, hemorrhage, hydrocephalus, extra-axial collection or mass lesion/mass effect. Vascular: No hyperdense vessel or unexpected calcification. Skull: Normal. Negative for fracture or focal lesion. Sinuses/Orbits: No acute finding. Other: None. IMPRESSION: No acute intracranial pathology. Electronically Signed   By: Lauralyn Primes M.D.   On: 09/04/2018 17:07   Ct Cervical Spine Wo Contrast  Result Date: 09/04/2018 CLINICAL DATA:  38 y/o F; fall at home onto kitchen floor. Back pain. Denies head injury. EXAM: CT CERVICAL, THORACIC, AND LUMBAR SPINE WITHOUT CONTRAST TECHNIQUE: Multidetector CT imaging of the cervical, thoracic and lumbar spine was performed without intravenous  contrast. Multiplanar CT image reconstructions were also generated. COMPARISON:  08/15/2018 CT abdomen and pelvis. FINDINGS: CT CERVICAL SPINE FINDINGS Alignment: Normal. Skull base and vertebrae: No acute fracture. No primary bone lesion or focal pathologic process. Soft tissues and spinal canal: No prevertebral fluid or swelling. No visible canal hematoma. Disc levels:  Intervertebral disc space heights are maintained. Upper chest:  Negative. CT THORACIC SPINE FINDINGS Alignment: Normal. Vertebrae: No acute fracture or focal pathologic process. Paraspinal and other soft tissues: Negative. Disc levels: Negative. CT LUMBAR SPINE FINDINGS Segmentation: 5 lumbar type vertebrae. Alignment: Stable L5-S1 grade 1 anterolisthesis with chronic bilateral L5 pars defects. Vertebrae: No acute fracture or focal pathologic process. Paraspinal and other soft tissues: Negative. Disc levels: Severe loss of the L5-S1 intervertebral disc space height. L5-S1 endplate marginal osteophytes, uncovered disc bulge, and facet hypertrophy results in severe bilateral foraminal stenosis. IMPRESSION: 1. No acute fracture or dislocation identified. 2. Stable grade 1 L5-S1 anterolisthesis with chronic bilateral L5 pars defects. 3. 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 Electronically Signed   By: Mitzi Hansen M.D.   On: 09/04/2018 17:12   Ct Thoracic Spine Wo Contrast  Result Date: 09/04/2018 CLINICAL DATA:  38 y/o F; fall at home onto kitchen floor. Back pain. Denies head injury. EXAM: CT CERVICAL, THORACIC, AND LUMBAR SPINE WITHOUT CONTRAST TECHNIQUE: Multidetector CT imaging of the cervical, thoracic and lumbar spine was performed without intravenous contrast. Multiplanar CT image reconstructions were also generated. COMPARISON:  08/15/2018 CT abdomen and pelvis. FINDINGS: CT CERVICAL SPINE FINDINGS Alignment: Normal. Skull base and vertebrae: No acute fracture. No primary bone lesion  or focal pathologic process. Soft tissues and spinal canal: No prevertebral fluid or swelling. No visible canal hematoma. Disc levels:  Intervertebral disc space heights are maintained. Upper chest: Negative. CT THORACIC SPINE FINDINGS Alignment: Normal. Vertebrae: No acute fracture or focal pathologic process. Paraspinal and other soft tissues: Negative. Disc levels: Negative. CT LUMBAR SPINE FINDINGS Segmentation: 5 lumbar type vertebrae. Alignment: Stable L5-S1 grade 1 anterolisthesis with chronic bilateral L5 pars defects. Vertebrae: No acute fracture or focal pathologic process. Paraspinal and other soft tissues: Negative. Disc levels: Severe loss of the L5-S1 intervertebral disc space height. L5-S1 endplate marginal osteophytes, uncovered disc bulge, and facet hypertrophy results in severe bilateral foraminal stenosis. IMPRESSION: 1. No acute fracture or dislocation identified. 2. Stable grade 1 L5-S1 anterolisthesis with chronic bilateral L5 pars defects. 3. 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 Electronically Signed   By: Mitzi Hansen M.D.   On: 09/04/2018 17:12   Ct Lumbar Spine Wo Contrast  Result Date: 09/04/2018 CLINICAL DATA:  38 y/o F; fall at home onto kitchen floor. Back pain. Denies head injury. EXAM: CT CERVICAL, THORACIC, AND LUMBAR SPINE WITHOUT CONTRAST TECHNIQUE: Multidetector CT imaging of the cervical, thoracic and lumbar spine was performed without intravenous contrast. Multiplanar CT image reconstructions were also generated. COMPARISON:  08/15/2018 CT abdomen and pelvis. FINDINGS: CT CERVICAL SPINE FINDINGS Alignment: Normal. Skull base and vertebrae: No acute fracture. No primary bone lesion or focal pathologic process. Soft tissues and spinal canal: No prevertebral fluid or swelling. No visible canal hematoma. Disc levels:  Intervertebral disc space heights are maintained. Upper chest: Negative. CT THORACIC SPINE FINDINGS  Alignment: Normal. Vertebrae: No acute fracture or focal pathologic process. Paraspinal and other soft tissues: Negative. Disc levels: Negative. CT LUMBAR SPINE FINDINGS Segmentation: 5 lumbar type vertebrae. Alignment: Stable L5-S1 grade 1 anterolisthesis with chronic bilateral L5 pars defects. Vertebrae: No acute fracture or focal pathologic process. Paraspinal and other soft tissues: Negative. Disc levels: Severe loss of the L5-S1 intervertebral disc space height. L5-S1 endplate marginal osteophytes, uncovered disc bulge, and facet hypertrophy results in severe bilateral foraminal stenosis. IMPRESSION: 1. No acute fracture or dislocation identified. 2. Stable grade 1 L5-S1 anterolisthesis with chronic bilateral L5 pars defects. 3. Stable  advanced L5-S1 spondylosis resulting in severe bilateral foraminal stenosis. Electronically Signed   By: Mitzi Hansen M.D.   On: 09/04/2018 17:12   Mr Lumbar Spine W Wo Contrast  Result Date: 09/04/2018 CLINICAL DATA:  Larey Seat in kitchen today, lower extremity numbness and pain. History of chronic low back pain. EXAM: MRI LUMBAR SPINE WITHOUT AND WITH CONTRAST TECHNIQUE: Multiplanar and multiecho pulse sequences of the lumbar spine were obtained without and with intravenous contrast. CONTRAST:  10 cc Gadavist COMPARISON:  CT abdomen and pelvis September 04, 2018 FINDINGS: SEGMENTATION: For the purposes of this report, the last well-formed intervertebral disc is reported as L5-S1. ALIGNMENT: Maintained lumbar lordosis. Grade 2 L5-S1 anterolisthesis on the basis of chronic bilateral L5 pars interarticularis defects. VERTEBRAE: Vertebral bodies are intact. Severe L5-S1 disc height loss with disc desiccation mild disc edema. Severe chronic discogenic endplate changes L5-S1. No acute or abnormal bone marrow signal. No abnormal osseous or intradiscal enhancement. CONUS MEDULLARIS AND CAUDA EQUINA: Conus medullaris terminates at T12-L1 and demonstrates normal morphology and  signal characteristics. Cauda equina is normal. No abnormal cord, leptomeningeal or epidural enhancement. PARASPINAL AND OTHER SOFT TISSUES: Nonacute. Dependent subcutaneous edema. 3.2 cm RIGHT adnexal cyst. DISC LEVELS: T12-L1 through L3-4: No disc bulge, canal stenosis nor neural foraminal narrowing. L4-5: No disc bulge. Mild facet arthropathy without canal stenosis or neural foraminal narrowing. L5-S1: Anterolisthesis. Unroofing of small broad-based disc bulge. Facet arthropathy. No canal stenosis. Severe bilateral neural foraminal narrowing. IMPRESSION: 1. Grade 2 L5-S1 anterolisthesis on the basis of chronic bilateral L5 pars interarticularis defects. No acute osseous process. 2. No canal stenosis.  Severe L5-S1 neural foraminal narrowing. Electronically Signed   By: Awilda Metro M.D.   On: 09/04/2018 19:04   Dg Hips Bilat W Or Wo Pelvis 3-4 Views  Result Date: 09/04/2018 CLINICAL DATA:  39 y/o  F; bilateral posterior hip pain after fall. EXAM: DG HIP (WITH OR WITHOUT PELVIS) 3-4V BILAT COMPARISON:  None. FINDINGS: There is no evidence of hip fracture or dislocation. There is no evidence of arthropathy or other focal bone abnormality. IMPRESSION: Negative. Electronically Signed   By: Mitzi Hansen M.D.   On: 09/04/2018 16:53    EKG: Independently reviewed.  Sinus tachycardia.  Assessment/Plan Principal Problem:   Low back pain Active Problems:   Hypothyroidism    1. Low back pain -we will continue pain relief medications, antispasmodics get physical therapy consult.  If pain does not improve may discuss with neurosurgery given the severe neural foraminal narrowing at L5-S1. 2. Asthma presently not wheezing on Singulair and inhalers. 3. Hypothyroidism on Synthroid. 4. Depression on Paxil and trazodone anxiolytics. 5. Leukocytosis likely reactionary patient afebrile.  Closely follow.   DVT prophylaxis: Lovenox. Code Status: Full code. Family Communication: Discussed with  patient. Disposition Plan: Home. Consults called: Physical therapy. Admission status: Observation.   Eduard Clos MD [facility] Pager [phone].  If 7PM-7AM, please contact night-coverage [email or web address] Password [email or web address]  09/04/2018, 9:50 PM

## 2018-09-04 NOTE — ED Notes (Addendum)
Pt back from imaging

## 2018-09-04 NOTE — ED Triage Notes (Addendum)
Pt states she slipped on a rug and fell this afternoon. Pt fell backwards on buttocks/back and hit head. Pt reports losing consciousness. Once she regained consciousness, pt called family on phone to come help her. Pt's father reports she was on the ground when he came and EMS had to carry pt to stretcher. Pt is alert and oriented. Pt complains of pain in left leg and headache.

## 2018-09-04 NOTE — ED Notes (Signed)
Pt transported to MRI 

## 2018-09-04 NOTE — ED Notes (Signed)
1st attempt to call report to 6n, RN in patients room and will call back for report.

## 2018-09-04 NOTE — ED Notes (Signed)
Pt complains of bilateral numbness in both legs and inability to move bilateral legs

## 2018-09-04 NOTE — ED Provider Notes (Signed)
[facility] EMERGENCY DEPARTMENT Provider Note   CSN: [id number] Arrival date & time: 09/04/18  1459    History   Chief Complaint Chief Complaint  Patient presents with  . Fall    HPI Carolyn Castro is a 38 y.o. female anxiety, asthma, hypothyroid who presents for evaluation of head pain, neck and back pain after a mechanical fall that occurred this afternoon. Per patient she was walking, when she slipped on a rug, causing her to fall backwards.  She reports she landed on the back of her head and back.  She reports positive LOC but does not know how long she was unconscious for.  She states she remembers waking up and grabbed her phone and called her dad.  When dad came, patient was laying on the ground.  Patient states that she is having pain in the back of her head, neck, back as well as her left hip.  Reports she has not been able to walk since the incident.  She states that she is having difficulty moving her legs and reports some tingling sensation that extends down bilateral legs.  She states she is on a blood thinners.  She has felt nauseous but denies any vomiting.  Patient denies any preceding chest pain.  She does state that she had a little bit of dizziness but does not know if that caused her fall.  Reports history of back issues and was seen in the ED earlier this morning for evaluation of back pain.  This is been ongoing for last 3 years and she has scheduled an appointment with a specialist in the upcoming weeks. Patient denies any urinary or bowel incontinence, saddle anesthesia, CP, SOB.      The history is provided by the patient.    Past Medical History:  Diagnosis Date  . Anxiety   . Asthma   . Hypothyroid     Patient Active Problem List   Diagnosis Date Noted  . Low back pain 09/04/2018  . Hypothyroidism 09/04/2018  . Infected incision 07/04/2018  . Adnexal mass 06/29/2018  . Pelvic pain 06/29/2018    Past Surgical History:  Procedure  Laterality Date  . COLONOSCOPY    . LAPAROSCOPIC OVARIAN CYSTECTOMY Left 06/29/2018   Procedure: LAPAROSCOPIC OVARIAN CYSTECTOMY;  Surgeon: Gerald Leitz, MD;  Location:  SURGERY CENTER;  Service: Gynecology;  Laterality: Left;  . TONSILLECTOMY    . WISDOM TOOTH EXTRACTION       OB History    Gravida  0   Para  0   Term  0   Preterm  0   AB  0   Living  0     SAB  0   TAB  0   Ectopic  0   Multiple  0   Live Births  0            Home Medications    Prior to Admission medications   Medication Sig Start Date End Date Taking? Authorizing Provider  acetaminophen (TYLENOL) 500 MG tablet Take 1,000 mg by mouth every 6 (six) hours as needed (for pain).    Yes [provider]  albuterol (ACCUNEB) 0.63 MG/3ML nebulizer solution Take 1 ampule by nebulization every 6 (six) hours as needed for wheezing.   Yes [provider]  albuterol (VENTOLIN HFA) 108 (90 Base) MCG/ACT inhaler Inhale 2 puffs into the lungs every 6 (six) hours as needed for wheezing or shortness of breath.   Yes [provider]  ALPRAZolam (XANAX) 0.5 MG tablet Take 1 tablet (0.5 mg total) by mouth 3 (three) times daily as needed for anxiety. Patient taking differently: Take 0.5 mg by mouth 2 (two) times daily as needed for anxiety.  08/23/18  Yes Cottle, Steva Ready., MD  CRANBERRY PO Take 1 tablet by mouth 2 (two) times daily.    Yes [provider]  diphenhydrAMINE (BENADRYL) 25 MG tablet Take 50 mg by mouth as needed for itching or allergies.    Yes [provider]  Docusate Sodium (STOOL SOFTENER) 100 MG capsule Take 100 mg by mouth 2 (two) times daily.   Yes [provider]  fexofenadine (ALLEGRA) 180 MG tablet Take 180 mg by mouth daily.   Yes [provider]  HYDROcodone-acetaminophen (NORCO/VICODIN) 5-325 MG tablet Take 1-2 tablets by mouth See admin instructions. Take 1-2 tablets by mouth every six hours as needed for severe pain    Yes [provider]  ibuprofen (ADVIL,MOTRIN) 200 MG tablet Take 800 mg by mouth every 6 (six) hours as needed (for pain).    Yes [provider]  levothyroxine (SYNTHROID, LEVOTHROID) 75 MCG tablet Take 75 mcg by mouth daily before breakfast.   Yes [provider]  methocarbamol (ROBAXIN) 500 MG tablet Take 1 tablet (500 mg total) by mouth every 8 (eight) hours as needed for muscle spasms. 09/03/18  Yes Ward, Kristen N, DO  mometasone (ASMANEX) 220 MCG/INH inhaler Inhale 2 puffs into the lungs 2 (two) times daily.    Yes [provider]  mometasone (NASONEX) 50 MCG/ACT nasal spray Place 2 sprays into the nose daily.   Yes [provider]  montelukast (SINGULAIR) 10 MG tablet Take 10 mg by mouth every evening.    Yes [provider]  naproxen (NAPROSYN) 500 MG tablet Take 1 tablet (500 mg total) by mouth 2 (two) times daily. 09/03/18  Yes Ward, Layla Maw, DO  norethindrone-ethinyl estradiol (MICROGESTIN,JUNEL,LOESTRIN) 1-20 MG-MCG tablet Take 1 tablet by mouth daily.   Yes [provider]  omeprazole (PRILOSEC OTC) 20 MG tablet Take 20 mg by mouth 2 (two) times daily.    Yes [provider]  ondansetron (ZOFRAN ODT) 4 MG disintegrating tablet Take 1 tablet (4 mg total) by mouth every 8 (eight) hours as needed for nausea or vomiting. 08/15/18  Yes Petrucelli, Samantha R, PA-C  PARoxetine (PAXIL) 20 MG tablet Take 3 tablets (60 mg total) by mouth daily. Patient taking differently: Take 40 mg by mouth daily.  08/23/18  Yes Cottle, Steva Ready., MD  predniSONE (STERAPRED UNI-PAK 21 TAB) 10 MG (21) TBPK tablet Take as directed Patient taking differently: Take 10-60 mg by mouth See admin instructions. Take 60 mg by mouth on day one, then decrease by 10 mg daily until finished (6 days) 09/03/18  Yes Ward, Layla Maw, DO  traZODone (DESYREL) 50 MG tablet Take 1-2 tablets (50-100 mg total) by mouth at bedtime. 08/23/18  Yes Cottle, Steva Ready., MD    PARoxetine (PAXIL) 20 MG tablet TAKE 2 TABLETS BY MOUTH EVERY DAY Patient not taking: No sig reported 08/13/18   Cottle, Steva Ready., MD    Family History History reviewed. No pertinent family history.  Social History Social History   Tobacco Use  . Smoking status: Never Smoker  . Smokeless tobacco: Never Used  Substance Use Topics  . Alcohol use: Never    Frequency: Never  . Drug use: Never     Allergies  Ciprofloxacin hcl   Review of Systems Review of Systems  Respiratory: Negative for cough and shortness of breath.   Cardiovascular: Negative for chest pain.  Gastrointestinal: Negative for abdominal pain, nausea and vomiting.  Genitourinary: Negative for dysuria and hematuria.  Musculoskeletal: Positive for back pain and neck pain.  Neurological: Positive for weakness, numbness and headaches.  All other systems reviewed and are negative.    Physical Exam Updated Vital Signs BP (!) 133/91   Pulse (!) 108   Temp 97.6 F (36.4 C) (Oral)   Resp (!) 23   Ht 5\' 4"  (1.626 m)   Wt 113.4 kg   LMP 08/23/2018 (Exact Date)   SpO2 97%   BMI 42.91 kg/m   Physical Exam Vitals signs and nursing note reviewed.  Constitutional:      Appearance: Normal appearance. She is well-developed.  HENT:     Head: Normocephalic and atraumatic.     Comments: No tenderness to palpation of skull. No deformities or crepitus noted. No open wounds, abrasions or lacerations.  Eyes:     General: Lids are normal.     Conjunctiva/sclera: Conjunctivae normal.     Pupils: Pupils are equal, round, and reactive to light.  Neck:     Musculoskeletal: Spinous process tenderness present.     Comments: Midline tenderness noted to the cervical region. No deformity or crepitus.  Cardiovascular:     Rate and Rhythm: Normal rate and regular rhythm.     Pulses: Normal pulses.          Radial pulses are 2+ on the right side and 2+ on the left side.       Dorsalis pedis pulses are 2+ on the right  side and 2+ on the left side.     Heart sounds: Normal heart sounds. No murmur. No friction rub. No gallop.   Pulmonary:     Effort: Pulmonary effort is normal.     Breath sounds: Normal breath sounds.     Comments: Lungs clear to auscultation bilaterally.  Symmetric chest rise.  No wheezing, rales, rhonchi. Abdominal:     Palpations: Abdomen is soft. Abdomen is not rigid.     Tenderness: There is no abdominal tenderness. There is no guarding.     Comments: Abdomen is soft, non-distended, non-tender. No rigidity, No guarding. No peritoneal signs.  Musculoskeletal: Normal range of motion.     Thoracic back: She exhibits tenderness.     Lumbar back: She exhibits tenderness.     Comments: Midline thoracic and lumbar tenderness. No deformity or crepitus noted. No pelvic instability. Tenderness to palpation noted to the left hip. No deformity or crepitus noted. Passive flexion/extension and internal/external rotation intact but with subjective reports of pain.   Skin:    General: Skin is warm and dry.     Capillary Refill: Capillary refill takes less than 2 seconds.  Neurological:     Mental Status: She is alert and oriented to person, place, and time.     Comments: Cranial nerves III-XII intact Follows commands 5/5 strength to BUE 1/5 strength of BLE.  Decreased sensation noted to bilateral thighs begins approximately the L2 distribution dermatome extends distally. Unable to plantar flexion and dorsiflexion. No slurred speech. No facial droop.   Psychiatric:        Speech: Speech normal.      ED Treatments / Results  Labs (all labs ordered are listed, but only abnormal results are displayed) Labs Reviewed  BASIC METABOLIC PANEL - Abnormal;  Notable for the following components:      Result Value   CO2 19 (*)    Glucose, Bld 124 (*)    Creatinine, Ser 1.07 (*)    Calcium 8.5 (*)    All other components within normal limits  CBC WITH DIFFERENTIAL/PLATELET - Abnormal; Notable for  the following components:   WBC 16.3 (*)    Neutro Abs 14.4 (*)    Abs Immature Granulocytes 0.11 (*)    All other components within normal limits  HIV ANTIBODY (ROUTINE TESTING W REFLEX)  BASIC METABOLIC PANEL  CBC  CBC  CREATININE, SERUM  I-STAT BETA HCG BLOOD, ED ([facility], [facility], [facility] ONLY)    EKG EKG Interpretation  Date/Time:  Tuesday September 04 2018 15:46:16 EST Ventricular Rate:  108 PR Interval:    QRS Duration: 96 QT Interval:  329 QTC Calculation: 441 R Axis:   -7 Text Interpretation:  Sinus tachycardia Low voltage, precordial leads No previous ECGs available Confirmed by Richardean Canal [id number]) on 09/04/2018 4:25:18 PM   Radiology Ct Head Wo Contrast  Result Date: 09/04/2018 CLINICAL DATA:  Fall EXAM: CT HEAD WITHOUT CONTRAST TECHNIQUE: Contiguous axial images were obtained from the base of the skull through the vertex without intravenous contrast. COMPARISON:  None. FINDINGS: Brain: No evidence of acute infarction, hemorrhage, hydrocephalus, extra-axial collection or mass lesion/mass effect. Vascular: No hyperdense vessel or unexpected calcification. Skull: Normal. Negative for fracture or focal lesion. Sinuses/Orbits: No acute finding. Other: None. IMPRESSION: No acute intracranial pathology. Electronically Signed   By: Lauralyn Primes M.D.   On: 09/04/2018 17:07   Ct Cervical Spine Wo Contrast  Result Date: 09/04/2018 CLINICAL DATA:  38 y/o F; fall at home onto kitchen floor. Back pain. Denies head injury. EXAM: CT CERVICAL, THORACIC, AND LUMBAR SPINE WITHOUT CONTRAST TECHNIQUE: Multidetector CT imaging of the cervical, thoracic and lumbar spine was performed without intravenous contrast. Multiplanar CT image reconstructions were also generated. COMPARISON:  08/15/2018 CT abdomen and pelvis. FINDINGS: CT CERVICAL SPINE FINDINGS Alignment: Normal. Skull base and vertebrae: No acute fracture. No primary bone lesion or focal pathologic process. Soft tissues and spinal canal: No prevertebral  fluid or swelling. No visible canal hematoma. Disc levels:  Intervertebral disc space heights are maintained. Upper chest: Negative. CT THORACIC SPINE FINDINGS Alignment: Normal. Vertebrae: No acute fracture or focal pathologic process. Paraspinal and other soft tissues: Negative. Disc levels: Negative. CT LUMBAR SPINE FINDINGS Segmentation: 5 lumbar type vertebrae. Alignment: Stable L5-S1 grade 1 anterolisthesis with chronic bilateral L5 pars defects. Vertebrae: No acute fracture or focal pathologic process. Paraspinal and other soft tissues: Negative. Disc levels: Severe loss of the L5-S1 intervertebral disc space height. L5-S1 endplate marginal osteophytes, uncovered disc bulge, and facet hypertrophy results in severe bilateral foraminal stenosis. IMPRESSION: 1. No acute fracture or dislocation identified. 2. Stable grade 1 L5-S1 anterolisthesis with chronic bilateral L5 pars defects. 3. 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 Electronically Signed   By: Mitzi Hansen M.D.   On: 09/04/2018 17:12   Ct Thoracic Spine Wo Contrast  Result Date: 09/04/2018 CLINICAL DATA:  38 y/o F; fall at home onto kitchen floor. Back pain. Denies head injury. EXAM: CT CERVICAL, THORACIC, AND LUMBAR SPINE WITHOUT CONTRAST TECHNIQUE: Multidetector CT imaging of the cervical, thoracic and lumbar spine was performed without intravenous contrast. Multiplanar CT image reconstructions were also generated. COMPARISON:  08/15/2018 CT abdomen and pelvis. FINDINGS: CT CERVICAL SPINE FINDINGS Alignment: Normal. Skull base and vertebrae: No acute fracture.  No primary bone lesion or focal pathologic process. Soft tissues and spinal canal: No prevertebral fluid or swelling. No visible canal hematoma. Disc levels:  Intervertebral disc space heights are maintained. Upper chest: Negative. CT THORACIC SPINE FINDINGS Alignment: Normal. Vertebrae: No acute fracture or focal pathologic process.  Paraspinal and other soft tissues: Negative. Disc levels: Negative. CT LUMBAR SPINE FINDINGS Segmentation: 5 lumbar type vertebrae. Alignment: Stable L5-S1 grade 1 anterolisthesis with chronic bilateral L5 pars defects. Vertebrae: No acute fracture or focal pathologic process. Paraspinal and other soft tissues: Negative. Disc levels: Severe loss of the L5-S1 intervertebral disc space height. L5-S1 endplate marginal osteophytes, uncovered disc bulge, and facet hypertrophy results in severe bilateral foraminal stenosis. IMPRESSION: 1. No acute fracture or dislocation identified. 2. Stable grade 1 L5-S1 anterolisthesis with chronic bilateral L5 pars defects. 3. 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 Electronically Signed   By: Mitzi Hansen M.D.   On: 09/04/2018 17:12   Ct Lumbar Spine Wo Contrast  Result Date: 09/04/2018 CLINICAL DATA:  38 y/o F; fall at home onto kitchen floor. Back pain. Denies head injury. EXAM: CT CERVICAL, THORACIC, AND LUMBAR SPINE WITHOUT CONTRAST TECHNIQUE: Multidetector CT imaging of the cervical, thoracic and lumbar spine was performed without intravenous contrast. Multiplanar CT image reconstructions were also generated. COMPARISON:  08/15/2018 CT abdomen and pelvis. FINDINGS: CT CERVICAL SPINE FINDINGS Alignment: Normal. Skull base and vertebrae: No acute fracture. No primary bone lesion or focal pathologic process. Soft tissues and spinal canal: No prevertebral fluid or swelling. No visible canal hematoma. Disc levels:  Intervertebral disc space heights are maintained. Upper chest: Negative. CT THORACIC SPINE FINDINGS Alignment: Normal. Vertebrae: No acute fracture or focal pathologic process. Paraspinal and other soft tissues: Negative. Disc levels: Negative. CT LUMBAR SPINE FINDINGS Segmentation: 5 lumbar type vertebrae. Alignment: Stable L5-S1 grade 1 anterolisthesis with chronic bilateral L5 pars defects. Vertebrae: No acute  fracture or focal pathologic process. Paraspinal and other soft tissues: Negative. Disc levels: Severe loss of the L5-S1 intervertebral disc space height. L5-S1 endplate marginal osteophytes, uncovered disc bulge, and facet hypertrophy results in severe bilateral foraminal stenosis. IMPRESSION: 1. No acute fracture or dislocation identified. 2. Stable grade 1 L5-S1 anterolisthesis with chronic bilateral L5 pars defects. 3. 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 Electronically Signed   By: Mitzi Hansen M.D.   On: 09/04/2018 17:12   Mr Lumbar Spine W Wo Contrast  Result Date: 09/04/2018 CLINICAL DATA:  Larey Seat in kitchen today, lower extremity numbness and pain. History of chronic low back pain. EXAM: MRI LUMBAR SPINE WITHOUT AND WITH CONTRAST TECHNIQUE: Multiplanar and multiecho pulse sequences of the lumbar spine were obtained without and with intravenous contrast. CONTRAST:  10 cc Gadavist COMPARISON:  CT abdomen and pelvis September 04, 2018 FINDINGS: SEGMENTATION: For the purposes of this report, the last well-formed intervertebral disc is reported as L5-S1. ALIGNMENT: Maintained lumbar lordosis. Grade 2 L5-S1 anterolisthesis on the basis of chronic bilateral L5 pars interarticularis defects. VERTEBRAE: Vertebral bodies are intact. Severe L5-S1 disc height loss with disc desiccation mild disc edema. Severe chronic discogenic endplate changes L5-S1. No acute or abnormal bone marrow signal. No abnormal osseous or intradiscal enhancement. CONUS MEDULLARIS AND CAUDA EQUINA: Conus medullaris terminates at T12-L1 and demonstrates normal morphology and signal characteristics. Cauda equina is normal. No abnormal cord, leptomeningeal or epidural enhancement. PARASPINAL AND OTHER SOFT TISSUES: Nonacute. Dependent subcutaneous edema. 3.2 cm RIGHT adnexal cyst. DISC LEVELS: T12-L1 through L3-4: No disc bulge,  canal stenosis nor neural foraminal narrowing. L4-5: No disc  bulge. Mild facet arthropathy without canal stenosis or neural foraminal narrowing. L5-S1: Anterolisthesis. Unroofing of small broad-based disc bulge. Facet arthropathy. No canal stenosis. Severe bilateral neural foraminal narrowing. IMPRESSION: 1. Grade 2 L5-S1 anterolisthesis on the basis of chronic bilateral L5 pars interarticularis defects. No acute osseous process. 2. No canal stenosis.  Severe L5-S1 neural foraminal narrowing. Electronically Signed   By: Awilda Metro M.D.   On: 09/04/2018 19:04   Dg Hips Bilat W Or Wo Pelvis 3-4 Views  Result Date: 09/04/2018 CLINICAL DATA:  39 y/o  F; bilateral posterior hip pain after fall. EXAM: DG HIP (WITH OR WITHOUT PELVIS) 3-4V BILAT COMPARISON:  None. FINDINGS: There is no evidence of hip fracture or dislocation. There is no evidence of arthropathy or other focal bone abnormality. IMPRESSION: Negative. Electronically Signed   By: Mitzi Hansen M.D.   On: 09/04/2018 16:53    Procedures Procedures (including critical care time)  Medications Ordered in ED Medications  ALPRAZolam (XANAX) tablet 0.5 mg (has no administration in time range)  PARoxetine (PAXIL) tablet 40 mg (has no administration in time range)  traZODone (DESYREL) tablet 50-100 mg (has no administration in time range)  levothyroxine (SYNTHROID, LEVOTHROID) tablet 75 mcg (has no administration in time range)  docusate sodium (COLACE) capsule 100 mg (has no administration in time range)  omeprazole (PRILOSEC OTC) EC tablet 20 mg (has no administration in time range)  methocarbamol (ROBAXIN) tablet 500 mg (has no administration in time range)  albuterol (PROVENTIL) (2.5 MG/3ML) 0.083% nebulizer solution 2.5 mg (has no administration in time range)  loratadine (CLARITIN) tablet 10 mg (has no administration in time range)  mometasone (ASMANEX) inhaler 2 puff (2 puffs Inhalation Not Given 09/04/18 2152)  montelukast (SINGULAIR) tablet 10 mg (has no administration in time range)    acetaminophen (TYLENOL) tablet 650 mg (has no administration in time range)    Or  acetaminophen (TYLENOL) suppository 650 mg (has no administration in time range)  ondansetron (ZOFRAN) tablet 4 mg (has no administration in time range)    Or  ondansetron (ZOFRAN) injection 4 mg (has no administration in time range)  enoxaparin (LOVENOX) injection 40 mg (has no administration in time range)  fentaNYL (SUBLIMAZE) injection 50 mcg (has no administration in time range)  methocarbamol (ROBAXIN) tablet 500 mg (has no administration in time range)  morphine 4 MG/ML injection 4 mg (4 mg Intravenous Given 09/04/18 1535)  fentaNYL (SUBLIMAZE) injection 100 mcg (100 mcg Intravenous Given 09/04/18 1710)  ondansetron (ZOFRAN) injection 4 mg (4 mg Intravenous Given 09/04/18 1710)  gadobutrol (GADAVIST) 1 MMOL/ML injection 10 mL (10 mLs Intravenous Contrast Given 09/04/18 1820)  dexamethasone (DECADRON) injection 10 mg (10 mg Intravenous Given 09/04/18 2007)  HYDROmorphone (DILAUDID) injection 1 mg (1 mg Intravenous Given 09/04/18 2001)  ALPRAZolam (XANAX) tablet 0.5 mg (0.5 mg Oral Given 09/04/18 2030)     Initial Impression / Assessment and Plan / ED Course  I have reviewed the triage vital signs and the nursing notes.  Pertinent labs & imaging results that were available during my care of the patient were reviewed by me and considered in my medical decision making (see chart for details).        38 year old female with chronic back pain presents after mechanical fall that occurred earlier this afternoon.  Reports hitting her head, hurting her back.  She reports most pain in the lumbar back.  Additionally, she is having difficulty moving her  bilateral lower extremities as well as some numbness/tingling to the bilateral lower extremities.  No urinary/bowel incontinence, saddle anesthesia.  Patient is not on blood thinners.  Cranial nerves intact.  Normal strength of bilateral upper extremities.  Limited strength  of bilateral lower extremities.  Also limited sensation that starts at the upper thighs and extends distally.  Plan for imaging of head, C, T, L-spine for any acute traumatic injury.   CT head negative for any acute intracranial abnormality.  CT of cervical and thoracic spine shows no evidence of acute any acute bony abnormality.  CT lumbar spine shows stable L5-S1 grade 1 anterior listhesis with chronic bilateral L5 pars defect.  There is severe loss of the L5-S1 intervertebral disc space.  Additionally, there is marginal osteophytes, and covered disc bulge and facet hypertrophy that results in severe bilateral foraminal stenosis.  Reevaluation after analgesics.  Patient still with limited mobility of her legs.  Will get MRI of L-spine for ration of acute cord injury.  MRI shows Grade 2 L5-S1 anterolisthesis with chronic bilateral pars defects. Additionally there is severe L5-S1 neural foraminal narrowing. No acute bony abnormality noted.   Discussed results with patient. She states she is still having pain and is still having difficulty moving her legs secondary to pain.  Plan for additional analgesics and reassess.  Reevaluation after additional analgesics.  Patient states she has not had improvement in pain.  Her pain is now 8/10.  Patient still with difficulty moving her leg secondary to pain.  Was unable to ambulate patient secondary to pain.  At this time, patient's pain has not been controlled with multiple rounds of pain medication.  Will consult for admission for pain control.  Discussed patient with family medicine. Patient is not a family medicine patient. Will consult hospitalist.   Discussed patient with Dr. Toniann Fail (hospitalist). Will accept for admission.    Final Clinical Impressions(s) / ED Diagnoses   Final diagnoses:  Acute bilateral low back pain, unspecified whether sciatica present    ED Discharge Orders    None       Rosana Hoes 09/04/18 2231     Charlynne Pander, MD 09/04/18 2352

## 2018-09-05 ENCOUNTER — Other Ambulatory Visit: Payer: Self-pay

## 2018-09-05 DIAGNOSIS — M5441 Lumbago with sciatica, right side: Secondary | ICD-10-CM | POA: Diagnosis not present

## 2018-09-05 DIAGNOSIS — M545 Low back pain: Secondary | ICD-10-CM | POA: Diagnosis not present

## 2018-09-05 DIAGNOSIS — M5442 Lumbago with sciatica, left side: Secondary | ICD-10-CM | POA: Diagnosis not present

## 2018-09-05 LAB — CBC
HEMATOCRIT: 37.3 % (ref 36.0–46.0)
Hemoglobin: 11.9 g/dL — ABNORMAL LOW (ref 12.0–15.0)
MCH: 28.3 pg (ref 26.0–34.0)
MCHC: 31.9 g/dL (ref 30.0–36.0)
MCV: 88.6 fL (ref 80.0–100.0)
Platelets: 367 10*3/uL (ref 150–400)
RBC: 4.21 MIL/uL (ref 3.87–5.11)
RDW: 15.7 % — ABNORMAL HIGH (ref 11.5–15.5)
WBC: 10.9 10*3/uL — ABNORMAL HIGH (ref 4.0–10.5)
nRBC: 0 % (ref 0.0–0.2)

## 2018-09-05 LAB — BASIC METABOLIC PANEL
Anion gap: 7 (ref 5–15)
BUN: 11 mg/dL (ref 6–20)
CHLORIDE: 108 mmol/L (ref 98–111)
CO2: 22 mmol/L (ref 22–32)
Calcium: 8.3 mg/dL — ABNORMAL LOW (ref 8.9–10.3)
Creatinine, Ser: 1.02 mg/dL — ABNORMAL HIGH (ref 0.44–1.00)
GFR calc Af Amer: >60 mL/min (ref >60)
GFR calc non Af Amer: >60 mL/min (ref >60)
Glucose, Bld: 123 mg/dL — ABNORMAL HIGH (ref 70–99)
Potassium: 4.1 mmol/L (ref 3.5–5.1)
Sodium: 137 mmol/L (ref 135–145)

## 2018-09-05 LAB — HIV ANTIBODY (ROUTINE TESTING W REFLEX): HIV Screen 4th Generation wRfx: NONREACTIVE

## 2018-09-05 MED ORDER — TRAMADOL HCL 50 MG PO TABS
50.0000 mg | ORAL_TABLET | Freq: Four times a day (QID) | ORAL | Status: DC
  Administered 2018-09-05 – 2018-09-06 (×5): 50 mg via ORAL
  Filled 2018-09-05 (×5): qty 1

## 2018-09-05 MED ORDER — HYDROCODONE-ACETAMINOPHEN 5-325 MG PO TABS
1.0000 | ORAL_TABLET | Freq: Four times a day (QID) | ORAL | Status: DC | PRN
  Administered 2018-09-05 – 2018-09-06 (×3): 1 via ORAL
  Filled 2018-09-05 (×3): qty 1

## 2018-09-05 MED ORDER — POLYETHYLENE GLYCOL 3350 17 G PO PACK
17.0000 g | PACK | Freq: Every day | ORAL | Status: DC
  Administered 2018-09-05 – 2018-09-06 (×2): 17 g via ORAL
  Filled 2018-09-05 (×2): qty 1

## 2018-09-05 MED ORDER — METHOCARBAMOL 500 MG PO TABS
500.0000 mg | ORAL_TABLET | Freq: Three times a day (TID) | ORAL | Status: DC
  Administered 2018-09-05 – 2018-09-06 (×5): 500 mg via ORAL
  Filled 2018-09-05 (×5): qty 1

## 2018-09-05 NOTE — Care Management Note (Addendum)
Case Management Note  Patient Details  Name: Carolyn Castro MRN: [id number] Date of Birth: 19-May-1981  Subjective/Objective:                    Action/Plan:  Discussed discharge planning with patient at bedside. Confirmed face sheet information. PCP is DR Shirlean Mylar. Patient has transportation to appointments and is able to get prescriptions filled.   Patient lives at home with her parents. Discussed PT recommendations for HHPT and 24 hour supervision. Patient voiced understanding and states one of her parents will be with her at all times.   Father Carolyn Castro [phone].   Patient in agreement with walker and 3 in 1. Will order through [facility].   Provided Medicare.gov list of home health agencies. Patient's top three choices [facility], [place], [place].   Spoke with Kenard Gower with [facility] , he is not sure if they cover [place], he will check with his office, awaiting call back. Drew returned call [facility] does not cover [place].   Placed a call to [facility] with [place]. Awaiting call back. Cory returned call and is unable to staff referral.  Left voicemail for Alvino Chapel with [facility] , awaiting call back. Have not heard back from [place]. Called [facility] office spoke to [facility], she will have have intake review information and decide if referral can be accepted. Patient aware. Will follow up with [facility].   Expected Discharge Date:                  Expected Discharge Plan:  Home w Home Health Services  In-House Referral:  NA  Discharge planning Services  CM Consult  Post Acute Care Choice:  Durable Medical Equipment, Home Health Choice offered to:  Patient  DME Arranged:  3-N-1, Walker rolling DME Agency:  [facility]  HH Arranged:  PT, OT HH Agency:     Status of Service:  In process, will continue to follow  If discussed at Long Length of Stay Meetings, dates discussed:    Additional Comments:  Kingsley Plan, RN 09/05/2018, 1:01 PM

## 2018-09-05 NOTE — Progress Notes (Signed)
Progress Note    Carolyn Castro  ZOX:[id number] DOB: 1981-06-17  DOA: 09/04/2018 PCP: Shirlean Mylar, MD    Brief Narrative:     Medical records reviewed and are as summarized below:  Carolyn Castro is an 38 y.o. female with history of hypothyroidism, asthma, depression presents to the ER for the second time with low back pain.    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  Hypothyroidism  -Synthroid.  Depression  -Paxil  Leukocytosis  -likely reactive  Obesity Estimated body mass index is 42.91 kg/m as calculated from the following:   Height as of this encounter:  (1.626 m).   Weight as of this encounter: 113.4 kg.   Family Communication/Anticipated D/C date and plan/Code Status   DVT prophylaxis: Lovenox ordered. Code Status: Full Code.  Family Communication:  Disposition Plan: prior to d/c, needs pain control    Medical Consultants:    None.   Subjective:   Still with 7/10 back pain at rest  Objective:    Vitals:   09/04/18 1700 09/04/18 1730 09/04/18 2304 09/05/18 0457  BP: (!) 136/93 (!) 133/91 (!) 142/94 114/73  Pulse:   91 87  Resp: (!) 28 (!) Temp:   97.8 F (36.6 C) 97.8 F (36.6 C)  TempSrc:   Oral Oral  SpO2:   97% 95%  Weight:      Height:        Intake/Output Summary (Last 24 hours) at 09/05/2018 8119 Last data filed at 09/05/2018  0459 Gross per 24 hour  Intake 240 ml  Output 350 ml  Net -110 ml   Filed Weights   09/04/18 1511  Weight: 113.4 kg    Exam: In bed, NAD B/l lower ext weakness, sensation intact rrr No wheezing Obese female A+Ox3  Data Reviewed:   I have personally reviewed following labs and imaging studies:  Labs: Labs show the following:   Basic Metabolic Panel: Recent Labs  Lab 09/04/18 1532 09/05/18 0146  NA 137 137  K 4.5 4.1  CL 110 108  CO2 19* 22  GLUCOSE 124* 123*  BUN 13 11  CREATININE 1.07* 1.02*  CALCIUM 8.5* 8.3*   GFR Estimated Creatinine Clearance: 94.1 mL/min (A) (by C-G formula based on SCr of 1.02 mg/dL (H)). Liver Function Tests: No results for input(s): AST, ALT, ALKPHOS, BILITOT, PROT, ALBUMIN in the last 168 hours. No results for input(s): LIPASE, AMYLASE in the last 168 hours. No results for input(s): AMMONIA in the last 168 hours. Coagulation profile No results for input(s): INR, PROTIME in the last 168 hours.  CBC: Recent Labs  Lab 09/04/18 1532 09/05/18 0146  WBC 16.3* 10.9*  NEUTROABS 14.4*  --   HGB 12.6 11.9*  HCT 40.3 37.3  MCV 89.0 88.6  PLT 387 367   Cardiac Enzymes: No results for input(s): CKTOTAL, CKMB, CKMBINDEX, TROPONINI in the last 168 hours. BNP (last 3 results)  No results for input(s): PROBNP in the last 8760 hours. CBG: No results for input(s): GLUCAP in the last 168 hours. D-Dimer: No results for input(s): DDIMER in the last 72 hours. Hgb A1c: No results for input(s): HGBA1C in the last 72 hours. Lipid Profile: No results for input(s): CHOL, HDL, LDLCALC, TRIG, CHOLHDL, LDLDIRECT in the last 72 hours. Thyroid function studies: No results for input(s): TSH, T4TOTAL, T3FREE, THYROIDAB in the last 72 hours.  Invalid input(s): FREET3 Anemia work up: No results for input(s): VITAMINB12, FOLATE, FERRITIN, TIBC, IRON, RETICCTPCT in the last 72 hours. Sepsis Labs: Recent Labs  Lab 09/04/18 1532 09/05/18 0146  WBC  16.3* 10.9*    Microbiology No results found for this or any previous visit (from the past 240 hour(s)).  Procedures and diagnostic studies:  Ct Head Wo Contrast  Result Date: 09/04/2018 CLINICAL DATA:  Fall EXAM: CT HEAD WITHOUT CONTRAST TECHNIQUE: Contiguous axial images were obtained from the base of the skull through the vertex without intravenous contrast. COMPARISON:  None. FINDINGS: Brain: No evidence of acute infarction, hemorrhage, hydrocephalus, extra-axial collection or mass lesion/mass effect. Vascular: No hyperdense vessel or unexpected calcification. Skull: Normal. Negative for fracture or focal lesion. Sinuses/Orbits: No acute finding. Other: None. IMPRESSION: No acute intracranial pathology. Electronically Signed   By: Lauralyn Primes M.D.   On: 09/04/2018 17:07   Ct Cervical Spine Wo Contrast  Result Date: 09/04/2018 CLINICAL DATA:  39 y/o F; fall at home onto kitchen floor. Back pain. Denies head injury. EXAM: CT CERVICAL, THORACIC, AND LUMBAR SPINE WITHOUT CONTRAST TECHNIQUE: Multidetector CT imaging of the cervical, thoracic and lumbar spine was performed without intravenous contrast. Multiplanar CT image reconstructions were also generated. COMPARISON:  08/15/2018 CT abdomen and pelvis. FINDINGS: CT CERVICAL SPINE FINDINGS Alignment: Normal. Skull base and vertebrae: No acute fracture. No primary bone lesion or focal pathologic process. Soft tissues and spinal canal: No prevertebral fluid or swelling. No visible canal hematoma. Disc levels:  Intervertebral disc space heights are maintained. Upper chest: Negative. CT THORACIC SPINE FINDINGS Alignment: Normal. Vertebrae: No acute fracture or focal pathologic process. Paraspinal and other soft tissues: Negative. Disc levels: Negative. CT LUMBAR SPINE FINDINGS Segmentation: 5 lumbar type vertebrae. Alignment: Stable L5-S1 grade 1 anterolisthesis with chronic bilateral L5 pars defects. Vertebrae: No acute fracture or focal pathologic  process. Paraspinal and other soft tissues: Negative. Disc levels: Severe loss of the L5-S1 intervertebral disc space height. L5-S1 endplate marginal osteophytes, uncovered disc bulge, and facet hypertrophy results in severe bilateral foraminal stenosis. IMPRESSION: 1. No acute fracture or dislocation identified. 2. Stable grade 1 L5-S1 anterolisthesis with chronic bilateral L5 pars defects. 3. 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 Electronically Signed   By: Mitzi Hansen M.D.   On: 09/04/2018 17:12   Ct Thoracic Spine Wo Contrast  Result Date: 09/04/2018 CLINICAL DATA:  38 y/o F; fall at home onto kitchen floor. Back pain. Denies head injury. EXAM: CT CERVICAL, THORACIC, AND LUMBAR SPINE WITHOUT CONTRAST TECHNIQUE: Multidetector CT imaging of the cervical, thoracic and lumbar spine was performed without intravenous contrast. Multiplanar CT image reconstructions were also generated. COMPARISON:  08/15/2018 CT abdomen and pelvis. FINDINGS: CT CERVICAL SPINE FINDINGS Alignment: Normal. Skull base and vertebrae: No acute fracture. No primary bone lesion or focal pathologic process. Soft tissues and spinal canal: No prevertebral fluid or swelling. No visible canal hematoma. Disc levels:  Intervertebral disc space heights are maintained. Upper chest: Negative. CT THORACIC SPINE FINDINGS Alignment: Normal. Vertebrae: No  acute fracture or focal pathologic process. Paraspinal and other soft tissues: Negative. Disc levels: Negative. CT LUMBAR SPINE FINDINGS Segmentation: 5 lumbar type vertebrae. Alignment: Stable L5-S1 grade 1 anterolisthesis with chronic bilateral L5 pars defects. Vertebrae: No acute fracture or focal pathologic process. Paraspinal and other soft tissues: Negative. Disc levels: Severe loss of the L5-S1 intervertebral disc space height. L5-S1 endplate marginal osteophytes, uncovered disc bulge, and facet hypertrophy results in severe bilateral  foraminal stenosis. IMPRESSION: 1. No acute fracture or dislocation identified. 2. Stable grade 1 L5-S1 anterolisthesis with chronic bilateral L5 pars defects. 3. 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 Electronically Signed   By: Mitzi Hansen M.D.   On: 09/04/2018 17:12   Ct Lumbar Spine Wo Contrast  Result Date: 09/04/2018 CLINICAL DATA:  38 y/o F; fall at home onto kitchen floor. Back pain. Denies head injury. EXAM: CT CERVICAL, THORACIC, AND LUMBAR SPINE WITHOUT CONTRAST TECHNIQUE: Multidetector CT imaging of the cervical, thoracic and lumbar spine was performed without intravenous contrast. Multiplanar CT image reconstructions were also generated. COMPARISON:  08/15/2018 CT abdomen and pelvis. FINDINGS: CT CERVICAL SPINE FINDINGS Alignment: Normal. Skull base and vertebrae: No acute fracture. No primary bone lesion or focal pathologic process. Soft tissues and spinal canal: No prevertebral fluid or swelling. No visible canal hematoma. Disc levels:  Intervertebral disc space heights are maintained. Upper chest: Negative. CT THORACIC SPINE FINDINGS Alignment: Normal. Vertebrae: No acute fracture or focal pathologic process. Paraspinal and other soft tissues: Negative. Disc levels: Negative. CT LUMBAR SPINE FINDINGS Segmentation: 5 lumbar type vertebrae. Alignment: Stable L5-S1 grade 1 anterolisthesis with chronic bilateral L5 pars defects. Vertebrae: No acute fracture or focal pathologic process. Paraspinal and other soft tissues: Negative. Disc levels: Severe loss of the L5-S1 intervertebral disc space height. L5-S1 endplate marginal osteophytes, uncovered disc bulge, and facet hypertrophy results in severe bilateral foraminal stenosis. IMPRESSION: 1. No acute fracture or dislocation identified. 2. Stable grade 1 L5-S1 anterolisthesis with chronic bilateral L5 pars defects. 3. Stable advanced L5-S1 spondylosis resulting in severe bilateral foraminal  stenosis. Electronically Signed   By: Mitzi Hansen M.D.   On: 09/04/2018 17:12   Mr Lumbar Spine W Wo Contrast  Result Date: 09/04/2018 CLINICAL DATA:  Larey Seat in kitchen today, lower extremity numbness and pain. History of chronic low back pain. EXAM: MRI LUMBAR SPINE WITHOUT AND WITH CONTRAST TECHNIQUE: Multiplanar and multiecho pulse sequences of the lumbar spine were obtained without and with intravenous contrast. CONTRAST:  10 cc Gadavist COMPARISON:  CT abdomen and pelvis September 04, 2018 FINDINGS: SEGMENTATION: For the purposes of this report, the last well-formed intervertebral disc is reported as L5-S1. ALIGNMENT: Maintained lumbar lordosis. Grade 2 L5-S1 anterolisthesis on the basis of chronic bilateral L5 pars interarticularis defects. VERTEBRAE: Vertebral bodies are intact. Severe L5-S1 disc height loss with disc desiccation mild disc edema. Severe chronic discogenic endplate changes L5-S1. No acute or abnormal bone marrow signal. No abnormal osseous or intradiscal enhancement. CONUS MEDULLARIS AND CAUDA EQUINA: Conus medullaris terminates at T12-L1 and demonstrates normal morphology and signal characteristics. Cauda equina is normal. No abnormal cord, leptomeningeal or epidural enhancement. PARASPINAL AND OTHER SOFT TISSUES: Nonacute. Dependent subcutaneous edema. 3.2 cm RIGHT adnexal cyst. DISC LEVELS: T12-L1 through L3-4: No disc bulge, canal stenosis nor neural foraminal narrowing. L4-5: No disc bulge. Mild facet arthropathy without canal stenosis or neural foraminal narrowing. L5-S1: Anterolisthesis. Unroofing of small broad-based disc bulge. Facet arthropathy. No canal stenosis. Severe bilateral neural foraminal narrowing. IMPRESSION: 1. Grade 2  L5-S1 anterolisthesis on the basis of chronic bilateral L5 pars interarticularis defects. No acute osseous process. 2. No canal stenosis.  Severe L5-S1 neural foraminal narrowing. Electronically Signed   By: Awilda Metroourtnay  Bloomer M.D.   On: 09/04/2018  19:04   Dg Hips Bilat W Or Wo Pelvis 3-4 Views  Result Date: 09/04/2018 CLINICAL DATA:  38 y/o  F; bilateral posterior hip pain after fall. EXAM: DG HIP (WITH OR WITHOUT PELVIS) 3-4V BILAT COMPARISON:  None. FINDINGS: There is no evidence of hip fracture or dislocation. There is no evidence of arthropathy or other focal bone abnormality. IMPRESSION: Negative. Electronically Signed   By: Mitzi HansenLance  Furusawa-Stratton M.D.   On: 09/04/2018 16:53    Medications:   . budesonide  0.5 mg Nebulization BID  . docusate sodium  100 mg Oral BID  . enoxaparin (LOVENOX) injection  40 mg Subcutaneous Q24H  . levothyroxine  75 mcg Oral QAC breakfast  . loratadine  10 mg Oral Daily  . methocarbamol  500 mg Oral TID AC & HS  . montelukast  10 mg Oral QPM  . pantoprazole  40 mg Oral BID  . PARoxetine  40 mg Oral Daily  . traZODone  50-100 mg Oral QHS   Continuous Infusions:   LOS: 0 days   Joseph ArtJessica U Jayro Mcmath  [facility]   How to contact the [facility] Attending or Consulting provider 7A - 7P or covering provider during after hours 7P -7A, for this patient?  1. Check the care team in [facility] and look for a) attending/consulting [facility] provider listed and b) the [facility] team listed 2. Log into [email or web address] and use [place]'s universal password to access. If you do not have the password, please contact the hospital operator. 3. Locate the [facility] provider you are looking for under [facility] and page to a number that you can be directly reached. 4. If you still have difficulty reaching the provider, please page the [facility] (Director on Call) for the Hospitalists listed on amion for assistance.  09/05/2018, 8:52 AM

## 2018-09-05 NOTE — Plan of Care (Signed)

## 2018-09-05 NOTE — Evaluation (Signed)
Physical Therapy Evaluation Patient Details Name: Carolyn Castro MRN: [id number] DOB: [birth year] Today's Date: 09/05/2018   History of Present Illness  38 y.o. female with history of hypothyroidism, asthma, depression presents to the ER after mechanical fall from slipping on throw rug landing on L leg and hitting head resulting in LoC.  Pt unsure of how long she was unconscious. CT head C-spine T-spine and L-spine which only showed severe L5-S1 neural foraminal narrowing otherwise nothing acute. Pt present in ED day before for back pain with radiating pain in L leg unrelated to fall.  Clinical Impression  PTA pt working in daycare, independent in mobility but activity tolerance limited by low back pain, lives with parents in home with 4 steps in and bed and bath on first floor. Pt currently extremely limited in safe mobility by dizziness, nausea with movement and headache, as well as decreased strength and ROM in bilateral LE L>R, unclear if deficit is due to pain or nerve involvement as she experiences 10/10 back pain with LE movement in sitting. Pt requires modA for bed mobility, min A for transfers and min A for ambulation of 3 feet from bed to recliner. PT will request vestibular PT exam at next session as possible nystagmus with finger follow and head turns. PT notified RN of symptomology which is consistent with concussion due to blow to head. PT recommending d/c home with HHPT if pt can reduce dizziness, and nausea with movement. PT will continue to follow acutely.     Follow Up Recommendations Home health PT;Supervision/Assistance - 24 hour    Equipment Recommendations  Rolling walker with 5" wheels;3in1 (PT)    Recommendations for Other Services OT consult(neuro consult for concussion )     Precautions / Restrictions Precautions Precautions: Fall Restrictions Weight Bearing Restrictions: No      Mobility  Bed Mobility Overal bed mobility: Needs Assistance Bed Mobility:  Rolling;Sidelying to Sit Rolling: Min assist Sidelying to sit: Mod assist       General bed mobility comments: pt educated on keeping spine in alignment to minimize pain, minA for rolling and modA for pushing up into sitting  Transfers Overall transfer level: Needs assistance Equipment used: Rolling walker (2 wheeled) Transfers: Sit to/from Stand Sit to Stand: Min assist         General transfer comment: minA for power up to RW, pt able to statically stand for 3 minutes for pericare before sitting. Pt with c/o of dizziness, nausea, in upright increased with duration of standing  Ambulation/Gait Ambulation/Gait assistance: Min assist Gait Distance (Feet): 3 Feet Assistive device: Rolling walker (2 wheeled) Gait Pattern/deviations: Step-to pattern;Decreased weight shift to left;Decreased step length - left;Antalgic Gait velocity: slow Gait velocity interpretation: <1.31 ft/sec, indicative of household ambulator General Gait Details: minA for steadying with side stepping from bed to recliner, continued nausea, dizziness and increasing headache        Balance Overall balance assessment: Needs assistance Sitting-balance support: No upper extremity supported;Feet supported Sitting balance-Leahy Scale: Fair Sitting balance - Comments: unable to accept challenge to balance   Standing balance support: No upper extremity supported Standing balance-Leahy Scale: Fair Standing balance comment: able to statically stand before reaching for RW                             Pertinent Vitals/Pain Pain Assessment: 0-10 Pain Score: 10-Worst pain ever Pain Location: back with movement Pain Descriptors / Indicators: Burning;Sharp;Shooting Pain Intervention(s): Limited  activity within patient's tolerance;Monitored during session;Repositioned    Home Living Family/patient expects to be discharged to:: Private residence Living Arrangements: Parent Available Help at Discharge:  Family Type of Home: House Home Access: Stairs to enter Entrance Stairs-Rails: Right Entrance Stairs-Number of Steps: 4 Home Layout: Multi-level;Able to live on main level with bedroom/bathroom Home Equipment: None      Prior Function Level of Independence: Independent         Comments: low back pain limiting activity tolerance        Extremity/Trunk Assessment   Upper Extremity Assessment Upper Extremity Assessment: Generalized weakness    Lower Extremity Assessment Lower Extremity Assessment: RLE deficits/detail;LLE deficits/detail RLE Deficits / Details: R hip, knee and ankle limited, strength assessed to  RLE: Unable to fully assess due to pain RLE Sensation: WNL LLE: Unable to fully assess due to pain LLE Coordination: WNL       Communication   Communication: No difficulties  Cognition Arousal/Alertness: Awake/alert Behavior During Therapy: WFL for tasks assessed/performed Overall Cognitive Status: Within Functional Limits for tasks assessed                                        General Comments General comments (skin integrity, edema, etc.): Pt with symptomology of concussion following blow to head and LoC, pt with c/o of dizziness, nausea, headache, and attempted to look for nystagmus but pt unable to finger follow due to increase dizziness and nausea.      Assessment/Plan    PT Assessment Patient needs continued PT services  PT Problem List Decreased strength;Decreased range of motion;Decreased activity tolerance;Decreased mobility;Pain       PT Treatment Interventions DME instruction;Gait training;Stair training;Functional mobility training;Therapeutic activities;Therapeutic exercise;Balance training;Cognitive remediation;Patient/family education    PT Goals (Current goals can be found in the Care Plan section)  Acute Rehab PT Goals Patient Stated Goal: have less pain PT Goal Formulation: With patient Time For Goal Achievement:  09/19/18 Potential to Achieve Goals: Fair    Frequency Min 3X/week    AM-PAC PT "6 Clicks" Mobility  Outcome Measure Help needed turning from your back to your side while in a flat bed without using bedrails?: A Little Help needed moving from lying on your back to sitting on the side of a flat bed without using bedrails?: A Lot Help needed moving to and from a bed to a chair (including a wheelchair)?: A Little Help needed standing up from a chair using your arms (e.g., wheelchair or bedside chair)?: A Little Help needed to walk in hospital room?: A Lot Help needed climbing 3-5 steps with a railing? : Total 6 Click Score: 14    End of Session Equipment Utilized During Treatment: Gait belt Activity Tolerance: Patient limited by pain;Other (comment)(dizziness, nausea and headache)   Nurse Communication: Mobility status;Precautions PT Visit Diagnosis: Unsteadiness on feet (R26.81);Other abnormalities of gait and mobility (R26.89);Muscle weakness (generalized) (M62.81);Difficulty in walking, not elsewhere classified (R26.2);Pain;Dizziness and giddiness (R42);Other symptoms and signs involving the nervous system (R29.898) Pain - Right/Left: (bilateral) Pain - part of body: Leg(back)    Time: 9233-0076 PT Time Calculation (min) (ACUTE ONLY): 28 min   Charges:   PT Evaluation $PT Eval Moderate Complexity: 1 Mod PT Treatments $Gait Training: 8-22 mins        [place] Jenifer B. Beverely Risen PT, DPT Acute Rehabilitation Services Pager [phone] Office [phone]   Elon Alas  Fleet 09/05/2018, 11:04 AM

## 2018-09-06 DIAGNOSIS — M545 Low back pain: Secondary | ICD-10-CM | POA: Diagnosis not present

## 2018-09-06 MED ORDER — TRAMADOL HCL 50 MG PO TABS: 50.0000 mg | ORAL_TABLET | Freq: Four times a day (QID) | ORAL | Status: AC

## 2018-09-06 MED ORDER — HYDROCODONE-ACETAMINOPHEN 5-325 MG PO TABS: 1.0000 | ORAL_TABLET | Freq: Four times a day (QID) | ORAL | 0 refills | Status: AC | PRN

## 2018-09-06 MED ORDER — METHOCARBAMOL 500 MG PO TABS: 500.0000 mg | ORAL_TABLET | Freq: Three times a day (TID) | ORAL | 0 refills | Status: AC

## 2018-09-06 NOTE — Progress Notes (Signed)
Pt discharged to home accompanied by dad.DC instructions given and reviewed.

## 2018-09-06 NOTE — Discharge Summary (Signed)
Physician Discharge Summary  Carolyn Castro ZOX:[id number] DOB: [birth year] DOA: 09/04/2018  PCP: Shirlean Mylar, MD  Admit date: 09/04/2018 Discharge date: 09/06/2018  Admitted From: home Discharge disposition: home   Recommendations for Outpatient Follow-Up:   Home health Referral placed on -line to Dr. Ollen Bowl   Discharge Diagnosis:   Principal Problem:   Low back pain Active Problems:   Hypothyroidism obesity   Discharge Condition: Improved.  Diet recommendation: regular  Wound care: None.  Code status: Full.   History of Present Illness:   Carolyn Castro is a 38 y.o. female with history of hypothyroidism, asthma, depression presents to the ER for the second time with low back pain.  Had come to the ER a day earlier with increasing low back pain was discharged home on pain relief medications.  Following which patient had a fall at home fell onto her buttocks and did hit her head.  No loss consciousness and following which patient was having severe pain and unable to walk.  Was brought to the ER.   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  Hypothyroidism  -Synthroid.  Depression  -Paxil  Post concussive symptoms -education given  Obesity Estimated body mass index is 42.91 kg/m as calculated from the following:   Height as of this encounter:  (1.626 m).   Weight as of this encounter: 113.4 kg.     Medical Consultants:      Discharge Exam:   Vitals:   09/06/18 0437 09/06/18 0839  BP: (!) 134/91   Pulse: 86   Resp: 18   Temp: 97.9 F (36.6 C)   SpO2: 100% 97%   Vitals:   09/05/18 2112 09/05/18 2122 09/06/18 0437 09/06/18 0839  BP: 106/71  (!) 134/91   Pulse: 81  86   Resp: 19  18   Temp: 97.6 F (36.4 C)  97.9 F (36.6 C)   TempSrc: Oral  Oral   SpO2: 96% 98% 100% 97%  Weight:      Height:        General exam: Appears calm and comfortable.   The results of significant diagnostics from this hospitalization (including imaging, microbiology, ancillary and laboratory) are listed below for reference.     Procedures and Diagnostic Studies:   Ct Head Wo Contrast  Result Date: 09/04/2018 CLINICAL DATA:  Fall EXAM: CT HEAD WITHOUT CONTRAST TECHNIQUE: Contiguous axial images were obtained from the base of the skull through the vertex without intravenous contrast. COMPARISON:  None. FINDINGS: Brain: No evidence of acute infarction, hemorrhage, hydrocephalus, extra-axial collection or mass lesion/mass effect. Vascular: No hyperdense vessel or unexpected calcification. Skull: Normal. Negative for fracture or focal lesion. Sinuses/Orbits: No acute finding. Other: None. IMPRESSION: No acute intracranial pathology. Electronically Signed   By: Lauralyn Primes M.D.   On: 09/04/2018 17:07   Ct Cervical Spine Wo Contrast  Result Date: 09/04/2018 CLINICAL DATA:  38 y/o F; fall at home onto kitchen floor. Back pain. Denies head injury. EXAM: CT CERVICAL, THORACIC, AND LUMBAR SPINE WITHOUT CONTRAST  TECHNIQUE: Multidetector CT imaging of the cervical, thoracic and lumbar spine was performed without intravenous contrast. Multiplanar CT image reconstructions were also generated. COMPARISON:  08/15/2018 CT abdomen and pelvis. FINDINGS: CT CERVICAL SPINE FINDINGS Alignment: Normal. Skull base and vertebrae: No acute fracture. No primary bone lesion or focal pathologic process. Soft tissues and spinal canal: No prevertebral fluid or swelling. No visible canal  hematoma. Disc levels:  Intervertebral disc space heights are maintained. Upper chest: Negative. CT THORACIC SPINE FINDINGS Alignment: Normal. Vertebrae: No acute fracture or focal pathologic process. Paraspinal and other soft tissues: Negative. Disc levels: Negative. CT LUMBAR SPINE FINDINGS Segmentation: 5 lumbar type vertebrae. Alignment: Stable L5-S1 grade 1 anterolisthesis with chronic bilateral L5 pars defects. Vertebrae: No acute fracture or focal pathologic process. Paraspinal and other soft tissues: Negative. Disc levels: Severe loss of the L5-S1 intervertebral disc space height. L5-S1 endplate marginal osteophytes, uncovered disc bulge, and facet hypertrophy results in severe bilateral foraminal stenosis. IMPRESSION: 1. No acute fracture or dislocation identified. 2. Stable grade 1 L5-S1 anterolisthesis with chronic bilateral L5 pars defects. 3. 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 Electronically Signed   By: Mitzi Hansen M.D.   On: 09/04/2018 17:12   Ct Thoracic Spine Wo Contrast  Result Date: 09/04/2018 CLINICAL DATA:  38 y/o F; fall at home onto kitchen floor. Back pain. Denies head injury. EXAM: CT CERVICAL, THORACIC, AND LUMBAR SPINE WITHOUT CONTRAST TECHNIQUE: Multidetector CT imaging of the cervical, thoracic and lumbar spine was performed without intravenous contrast. Multiplanar CT image reconstructions were also generated. COMPARISON:  08/15/2018 CT abdomen and pelvis. FINDINGS: CT CERVICAL SPINE FINDINGS Alignment: Normal. Skull base and vertebrae: No acute fracture. No primary bone lesion or focal pathologic process. Soft tissues and spinal canal: No prevertebral fluid or swelling. No visible canal hematoma. Disc levels:  Intervertebral disc space heights are maintained. Upper chest: Negative. CT THORACIC SPINE FINDINGS Alignment: Normal. Vertebrae: No acute fracture or focal pathologic process. Paraspinal and other soft tissues:  Negative. Disc levels: Negative. CT LUMBAR SPINE FINDINGS Segmentation: 5 lumbar type vertebrae. Alignment: Stable L5-S1 grade 1 anterolisthesis with chronic bilateral L5 pars defects. Vertebrae: No acute fracture or focal pathologic process. Paraspinal and other soft tissues: Negative. Disc levels: Severe loss of the L5-S1 intervertebral disc space height. L5-S1 endplate marginal osteophytes, uncovered disc bulge, and facet hypertrophy results in severe bilateral foraminal stenosis. IMPRESSION: 1. No acute fracture or dislocation identified. 2. Stable grade 1 L5-S1 anterolisthesis with chronic bilateral L5 pars defects. 3. 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 Electronically Signed   By: Mitzi Hansen M.D.   On: 09/04/2018 17:12   Ct Lumbar Spine Wo Contrast  Result Date: 09/04/2018 CLINICAL DATA:  38 y/o F; fall at home onto kitchen floor. Back pain. Denies head injury. EXAM: CT CERVICAL, THORACIC, AND LUMBAR SPINE WITHOUT CONTRAST TECHNIQUE: Multidetector CT imaging of the cervical, thoracic and lumbar spine was performed without intravenous contrast. Multiplanar CT image reconstructions were also generated. COMPARISON:  08/15/2018 CT abdomen and pelvis. FINDINGS: CT CERVICAL SPINE FINDINGS Alignment: Normal. Skull base and vertebrae: No acute fracture. No primary bone lesion or focal pathologic process. Soft tissues and spinal canal: No prevertebral fluid or swelling. No visible canal hematoma. Disc levels:  Intervertebral disc space heights are maintained. Upper chest: Negative. CT THORACIC SPINE FINDINGS Alignment: Normal. Vertebrae: No acute fracture or focal pathologic process. Paraspinal and other soft tissues: Negative. Disc levels: Negative. CT LUMBAR SPINE FINDINGS Segmentation: 5 lumbar  type vertebrae. Alignment: Stable L5-S1 grade 1 anterolisthesis with chronic bilateral L5 pars defects. Vertebrae: No acute fracture or focal pathologic  process. Paraspinal and other soft tissues: Negative. Disc levels: Severe loss of the L5-S1 intervertebral disc space height. L5-S1 endplate marginal osteophytes, uncovered disc bulge, and facet hypertrophy results in severe bilateral foraminal stenosis. IMPRESSION: 1. No acute fracture or dislocation identified. 2. Stable grade 1 L5-S1 anterolisthesis with chronic bilateral L5 pars defects. 3. 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 Electronically Signed   By: Mitzi Hansen M.D.   On: 09/04/2018 17:12   Mr Lumbar Spine W Wo Contrast  Result Date: 09/04/2018 CLINICAL DATA:  Larey Seat in kitchen today, lower extremity numbness and pain. History of chronic low back pain. EXAM: MRI LUMBAR SPINE WITHOUT AND WITH CONTRAST TECHNIQUE: Multiplanar and multiecho pulse sequences of the lumbar spine were obtained without and with intravenous contrast. CONTRAST:  10 cc Gadavist COMPARISON:  CT abdomen and pelvis September 04, 2018 FINDINGS: SEGMENTATION: For the purposes of this report, the last well-formed intervertebral disc is reported as L5-S1. ALIGNMENT: Maintained lumbar lordosis. Grade 2 L5-S1 anterolisthesis on the basis of chronic bilateral L5 pars interarticularis defects. VERTEBRAE: Vertebral bodies are intact. Severe L5-S1 disc height loss with disc desiccation mild disc edema. Severe chronic discogenic endplate changes L5-S1. No acute or abnormal bone marrow signal. No abnormal osseous or intradiscal enhancement. CONUS MEDULLARIS AND CAUDA EQUINA: Conus medullaris terminates at T12-L1 and demonstrates normal morphology and signal characteristics. Cauda equina is normal. No abnormal cord, leptomeningeal or epidural enhancement. PARASPINAL AND OTHER SOFT TISSUES: Nonacute. Dependent subcutaneous edema. 3.2 cm RIGHT adnexal cyst. DISC LEVELS: T12-L1 through L3-4: No disc bulge, canal stenosis nor neural foraminal narrowing. L4-5: No disc bulge. Mild facet arthropathy  without canal stenosis or neural foraminal narrowing. L5-S1: Anterolisthesis. Unroofing of small broad-based disc bulge. Facet arthropathy. No canal stenosis. Severe bilateral neural foraminal narrowing. IMPRESSION: 1. Grade 2 L5-S1 anterolisthesis on the basis of chronic bilateral L5 pars interarticularis defects. No acute osseous process. 2. No canal stenosis.  Severe L5-S1 neural foraminal narrowing. Electronically Signed   By: Awilda Metro M.D.   On: 09/04/2018 19:04   Dg Hips Bilat W Or Wo Pelvis 3-4 Views  Result Date: 09/04/2018 CLINICAL DATA:  38 y/o  F; bilateral posterior hip pain after fall. EXAM: DG HIP (WITH OR WITHOUT PELVIS) 3-4V BILAT COMPARISON:  None. FINDINGS: There is no evidence of hip fracture or dislocation. There is no evidence of arthropathy or other focal bone abnormality. IMPRESSION: Negative. Electronically Signed   By: Mitzi Hansen M.D.   On: 09/04/2018 16:53     Labs:   Basic Metabolic Panel: Recent Labs  Lab 09/04/18 1532 09/05/18 0146  NA 137 137  K 4.5 4.1  CL 110 108  CO2 19* 22  GLUCOSE 124* 123*  BUN 13 11  CREATININE 1.07* 1.02*  CALCIUM 8.5* 8.3*   GFR Estimated Creatinine Clearance: 94.1 mL/min (A) (by C-G formula based on SCr of 1.02 mg/dL (H)). Liver Function Tests: No results for input(s): AST, ALT, ALKPHOS, BILITOT, PROT, ALBUMIN in the last 168 hours. No results for input(s): LIPASE, AMYLASE in the last 168 hours. No results for input(s): AMMONIA in the last 168 hours. Coagulation profile No results for input(s): INR, PROTIME in the last 168 hours.  CBC: Recent Labs  Lab 09/04/18 1532 09/05/18 0146  WBC 16.3* 10.9*  NEUTROABS 14.4*  --   HGB 12.6 11.9*  HCT 40.3 37.3  MCV 89.0 88.6  PLT 387 367   Cardiac Enzymes: No results for input(s): CKTOTAL, CKMB, CKMBINDEX, TROPONINI in the last 168 hours. BNP: Invalid input(s): POCBNP CBG: No results for input(s): GLUCAP in the last 168 hours. D-Dimer No results for  input(s): DDIMER in the last 72 hours. Hgb A1c No results for input(s): HGBA1C in the last 72 hours. Lipid Profile No results for input(s): CHOL, HDL, LDLCALC, TRIG, CHOLHDL, LDLDIRECT in the last 72 hours. Thyroid function studies No results for input(s): TSH, T4TOTAL, T3FREE, THYROIDAB in the last 72 hours.  Invalid input(s): FREET3 Anemia work up No results for input(s): VITAMINB12, FOLATE, FERRITIN, TIBC, IRON, RETICCTPCT in the last 72 hours. Microbiology No results found for this or any previous visit (from the past 240 hour(s)).   Discharge Instructions:   Discharge Instructions    Diet general   Complete by:  As directed    Discharge instructions   Complete by:  As directed    Home health with 24 hour supervision I made a referral to Dr. Clovis Pu office-- he does pain control with non-surgical options Follow up with PCP Return to ER with worsening pain, bowel or bladder incontinence and fever   Increase activity slowly   Complete by:  As directed      Allergies as of 09/06/2018      Reactions   Ciprofloxacin Hcl Diarrhea, Other (See Comments)   Developed C-Diff      Medication List    STOP taking these medications   naproxen 500 MG tablet Commonly known as:  NAPROSYN   predniSONE 10 MG (21) Tbpk tablet Commonly known as:  STERAPRED UNI-PAK 21 TAB     TAKE these medications   acetaminophen 500 MG tablet Commonly known as:  TYLENOL Take 1,000 mg by mouth every 6 (six) hours as needed (for pain).   albuterol 0.63 MG/3ML nebulizer solution Commonly known as:  ACCUNEB Take 1 ampule by nebulization every 6 (six) hours as needed for wheezing.   VENTOLIN HFA 108 (90 Base) MCG/ACT inhaler Generic drug:  albuterol Inhale 2 puffs into the lungs every 6 (six) hours as needed for wheezing or shortness of breath.   ALPRAZolam 0.5 MG tablet Commonly known as:  XANAX Take 1 tablet (0.5 mg total) by mouth 3 (three) times daily as needed for anxiety. What changed:   when to take this   CRANBERRY PO Take 1 tablet by mouth 2 (two) times daily.   diphenhydrAMINE 25 MG tablet Commonly known as:  BENADRYL Take 50 mg by mouth as needed for itching or allergies.   fexofenadine 180 MG tablet Commonly known as:  ALLEGRA Take 180 mg by mouth daily.   HYDROcodone-acetaminophen 5-325 MG tablet Commonly known as:  NORCO/VICODIN Take 1 tablet by mouth every 6 (six) hours as needed for up to 3 days for severe pain. What changed:    how much to take  when to take this  reasons to take this  additional instructions   ibuprofen 200 MG tablet Commonly known as:  ADVIL,MOTRIN Take 800 mg by mouth every 6 (six) hours as needed (for pain).   levothyroxine 75 MCG tablet Commonly known as:  SYNTHROID, LEVOTHROID Take 75 mcg by mouth daily before breakfast.   methocarbamol 500 MG tablet Commonly known as:  ROBAXIN Take 1 tablet (500 mg total) by mouth 4 (four) times daily -  before meals and at bedtime for 5 days. What changed:    when to take this  reasons to  take this   mometasone 220 MCG/INH inhaler Commonly known as:  ASMANEX Inhale 2 puffs into the lungs 2 (two) times daily.   mometasone 50 MCG/ACT nasal spray Commonly known as:  NASONEX Place 2 sprays into the nose daily.   montelukast 10 MG tablet Commonly known as:  SINGULAIR Take 10 mg by mouth every evening.   norethindrone-ethinyl estradiol 1-20 MG-MCG tablet Commonly known as:  MICROGESTIN,JUNEL,LOESTRIN Take 1 tablet by mouth daily.   omeprazole 20 MG tablet Commonly known as:  PRILOSEC OTC Take 20 mg by mouth 2 (two) times daily.   ondansetron 4 MG disintegrating tablet Commonly known as:  ZOFRAN ODT Take 1 tablet (4 mg total) by mouth every 8 (eight) hours as needed for nausea or vomiting.   PARoxetine 20 MG tablet Commonly known as:  PAXIL Take 3 tablets (60 mg total) by mouth daily. What changed:    how much to take  Another medication with the same name was  removed. Continue taking this medication, and follow the directions you see here.   STOOL SOFTENER 100 MG capsule Generic drug:  Docusate Sodium Take 100 mg by mouth 2 (two) times daily.   traMADol 50 MG tablet Commonly known as:  ULTRAM Take 1 tablet (50 mg total) by mouth every 6 (six) hours for 5 days.   traZODone 50 MG tablet Commonly known as:  DESYREL Take 1-2 tablets (50-100 mg total) by mouth at bedtime.            Durable Medical Equipment  (From admission, onward)         Start     Ordered   09/05/18 1312  For home use only DME Walker rolling  Once    Question:  Patient needs a walker to treat with the following condition  Answer:  Low back pain   09/05/18 1311   09/05/18 1312  For home use only DME 3 n 1  Once     09/05/18 1311         Follow-up Information    [facility] Follow up.   Specialty:  Home Health Services Contact information: [address] [place] [address] [place] [address] [phone]        Shirlean Mylar, MD Follow up in 1 week(s).   Specialty:  Family Medicine Contact information: [address] [place] [address] [phone]        Odette Fraction, MD Follow up.   Specialty:  Anesthesiology Contact information: [address] [place] [address] [phone]            Time coordinating discharge: 25 min  Signed:  Joseph Art DO  [facility] 09/06/2018, 12:22 PM

## 2018-09-06 NOTE — Progress Notes (Signed)
Physical Therapy Treatment/Vestibular Assessment Patient Details Name: Carolyn Castro MRN: [id number] DOB: Jan 08, 1981 Today's Date: 09/06/2018    History of Present Illness 38 y.o. female with history of hypothyroidism, asthma, depression presents to the ER after mechanical fall from slipping on throw rug landing on L leg and hitting head resulting in LoC.  Pt unsure of how long she was unconscious. CT head C-spine T-spine and L-spine which only showed severe L5-S1 neural foraminal narrowing otherwise nothing acute. Pt present in ED day before for back pain with radiating pain in L leg unrelated to fall.    PT Comments    Pt's dizziness symptoms are more consistent with post concussive dizziness than central or peripheral canal issues.  She had difficulty with gaze stability testing, so x 1 seated exercises given for HEP (medbridge access code: [email or web address]). Canal testing was negative and pt was able to mobilize well enough to d/c home with her family today.  PT to follow acutely until d/c confirmed.    Follow Up Recommendations  Home health PT;Supervision for mobility/OOB(vestibular therapist if possible )     Equipment Recommendations  Rolling walker with 5" wheels;3in1 (PT)    Recommendations for Other Services   NA     Precautions / Restrictions Precautions Precautions: Fall Restrictions Weight Bearing Restrictions: No    Mobility  Bed Mobility Overal bed mobility: Needs Assistance Bed Mobility: Rolling;Sidelying to Sit;Sit to Supine Rolling: Supervision Sidelying to sit: Supervision   Sit to supine: Min guard   General bed mobility comments: Supervision for safety, pt using bed rails to assist with mobility.   Transfers Overall transfer level: Needs assistance Equipment used: Rolling walker (2 wheeled) Transfers: Sit to/from Stand Sit to Stand: Supervision         General transfer comment: supervision for safety, pt demonstrated safe hand placement for  transitions.   Ambulation/Gait Ambulation/Gait assistance: Supervision Gait Distance (Feet): 65 Feet Assistive device: Rolling walker (2 wheeled) Gait Pattern/deviations: Step-through pattern;Shuffle Gait velocity: decreased Gait velocity interpretation: <1.8 ft/sec, indicate of risk for recurrent falls General Gait Details: Pt with slow, shuffling gait, decreased tolerance of mobility due to increased HA and reports of lightheadedness.    Stairs Stairs: Yes Stairs assistance: Min guard Stair Management: One rail Left;Step to pattern;Sideways Number of Stairs: 4 General stair comments: Simulating home entry, min guard assist for safety, slow, guarded pattern, cues for safe technique.         Balance Overall balance assessment: Needs assistance Sitting-balance support: Feet supported;No upper extremity supported Sitting balance-Leahy Scale: Good     Standing balance support: Bilateral upper extremity supported Standing balance-Leahy Scale: Fair               High level balance activites: Side stepping;Backward walking         09/06/18 1142  Vestibular Assessment  General Observation lights off in room, pt reporting posterior head pain from fall with (+) LOC.  Describes dizziness as lightheadedness and some mild spinning.   Symptom Behavior  Type of Dizziness  Blurred vision;Spinning;Lightheadedness  Frequency of Dizziness increases with mobility  Duration of Dizziness long  Symptom Nature Motion provoked  Aggravating Factors Activity in general  Relieving Factors Lying supine;Dark room;Closing eyes  Progression of Symptoms Better  History of similar episodes no  Oculomotor Exam  Oculomotor Alignment Normal  Ocular ROM normal  Spontaneous Absent  Gaze-induced  Absent  Smooth Pursuits Saccades (not true saccades, "jumpping eyes")  Saccades Comment (irratic, slow, but not what I would  expect)  Comment Pt has interesting jumpping eye movements with occulomotor  exam that almost seen inorganic as if she is making her eyes move.  It is not what I would expect from a truely abnormal exam.   Vestibulo-Ocular Reflex  VOR 1 Head Only (x 1 viewing) difficulty holding target and moving head, increased HA and dizziness with both vertical and horizontal, again, jumpping eyes  Auditory  Comments finger rub test equal bil  Positional Testing  Horizontal Canal Testing Horizontal Canal Right;Horizontal Canal Left  Horizontal Canal Right  Horizontal Canal Right Duration some vage reports of dizziness as long as we are in testing position  Horizontal Canal Right Symptoms Normal  Horizontal Canal Left  Horizontal Canal Left Duration some vage reports of dizziness as long as we are in testing position  Horizontal Canal Left Symptoms Normal   09/06/2018 dix hallpike not preformed as pt's symptoms were vague and I will normally see some refractory nystagmus during the roll test if there is a posterior problem.          Cognition Arousal/Alertness: Awake/alert Behavior During Therapy: WFL for tasks assessed/performed Overall Cognitive Status: Within Functional Limits for tasks assessed                                        Exercises Other Exercises Other Exercises: x1 seated verticial and horizontal gaze stability exercises given with two target "A" and handout.     General Comments General comments (skin integrity, edema, etc.): Pt's dizziness reported as 2-3/10  and back pain 6/10 with movement. negative CT C, T and L spine- found only severe neural foraminal narrowing in L -spine.      Pertinent Vitals/Pain Pain Assessment: Faces Pain Score: 6  Faces Pain Scale: Hurts little more Pain Location: back of head Pain Descriptors / Indicators: Aching Pain Intervention(s): Limited activity within patient's tolerance;Monitored during session;Repositioned    Home Living Family/patient expects to be discharged to:: Private residence Living  Arrangements: Parent Available Help at Discharge: Family Type of Home: House Home Access: Stairs to enter Entrance Stairs-Rails: Right Home Layout: Multi-level;Able to live on main level with bedroom/bathroom Home Equipment: None      Prior Function Level of Independence: Independent      Comments: low back pain limiting activity tolerance   PT Goals (current goals can now be found in the care plan section) Acute Rehab PT Goals Patient Stated Goal: to go home with less pain Progress towards PT goals: Progressing toward goals    Frequency    Min 3X/week      PT Plan Current plan remains appropriate       AM-PAC PT "6 Clicks" Mobility   Outcome Measure  Help needed turning from your back to your side while in a flat bed without using bedrails?: A Little Help needed moving from lying on your back to sitting on the side of a flat bed without using bedrails?: A Little Help needed moving to and from a bed to a chair (including a wheelchair)?: A Little Help needed standing up from a chair using your arms (e.g., wheelchair or bedside chair)?: None Help needed to walk in hospital room?: None Help needed climbing 3-5 steps with a railing? : A Little 6 Click Score: 20    End of Session Equipment Utilized During Treatment: Gait belt Activity Tolerance: Patient limited by pain;Other (comment)(limited by lightheadedness) Patient  left: in bed;with call bell/phone within reach   PT Visit Diagnosis: Unsteadiness on feet (R26.81);Other abnormalities of gait and mobility (R26.89);Muscle weakness (generalized) (M62.81);Difficulty in walking, not elsewhere classified (R26.2);Pain;Dizziness and giddiness (R42);Other symptoms and signs involving the nervous system (R29.898) Pain - Right/Left: (posterior) Pain - part of body: (head)     Time: 0539-7673 PT Time Calculation (min) (ACUTE ONLY): 33 min  Charges:  $Gait Training: 8-22 mins $Therapeutic Activity: 8-22 mins             Jalyah Weinheimer B. Kimetha Trulson, PT, DPT  Acute Rehabilitation [phone] pager #[phone] office            09/06/2018, 11:47 AM

## 2018-09-06 NOTE — Evaluation (Signed)
Occupational Therapy Evaluation Patient Details Name: Carolyn Castro MRN: [id number] DOB: 28-Mar-1981 Today's Date: 09/06/2018    History of Present Illness 38 y.o. female with history of hypothyroidism, asthma, depression presents to the ER after mechanical fall from slipping on throw rug landing on L leg and hitting head resulting in LoC.  Pt unsure of how long she was unconscious. CT head C-spine T-spine and L-spine which only showed severe L5-S1 neural foraminal narrowing otherwise nothing acute. Pt present in ED day before for back pain with radiating pain in L leg unrelated to fall.   Clinical Impression   Pt PTA:  Living with family, working and independent with ADL and mobility; chronic low back pain. Pt currently, limited by pain in low back and dizziness. On a scale from 1 to 10, pt reported dizziness with movement a 2-3/10. Pt able to track and scan- read small print, but only a few words before she has to look away. Pt performing toilet hygiene and clothing management with minA; setupA for grooming tasks. Pt mobilizing with RW for stability only ~15' x2 times. LD AE education provided and pt in too severe pf pain to attempt, RN aware. Pt would benefit from continued OT skilled services for ADL, mobility and safety in [place] setting. OT to follow acutely.    Follow Up Recommendations  Home health OT;Supervision - Intermittent    Equipment Recommendations  3 in 1 bedside commode    Recommendations for Other Services       Precautions / Restrictions Precautions Precautions: Fall Restrictions Weight Bearing Restrictions: No      Mobility Bed Mobility Overal bed mobility: Needs Assistance Bed Mobility: Rolling;Sidelying to Sit Rolling: Min guard         General bed mobility comments: log roll technique performed  Transfers Overall transfer level: Needs assistance Equipment used: Rolling walker (2 wheeled) Transfers: Sit to/from Stand Sit to Stand: Min guard               Balance Overall balance assessment: Needs assistance   Sitting balance-Leahy Scale: Fair       Standing balance-Leahy Scale: Fair               High level balance activites: Side stepping;Backward walking             ADL either performed or assessed with clinical judgement   ADL Overall ADL's : Needs assistance/impaired Eating/Feeding: Set up   Grooming: Supervision/safety;Standing   Upper Body Bathing: Set up;Standing   Lower Body Bathing: Moderate assistance;Sitting/lateral leans;Sit to/from stand(able to use AE) Lower Body Bathing Details (indicate cue type and reason): Education on AE via hip kit - Upper Body Dressing : Set up;Standing   Lower Body Dressing: Moderate assistance;Sitting/lateral leans;Sit to/from stand   Toilet Transfer: Min guard;BSC;Grab bars;RW   Toileting- Water quality scientist and Hygiene: Sitting/lateral lean;Sit to/from stand;Minimal assistance       Functional mobility during ADLs: Min guard;Rolling walker General ADL Comments: Pt performing toilet hygiene with minA overall. Pt performing UB ADL with set-upA; LB ADL with AE with difficulty due to body habitus adn discomfort with dizziness and back pain     Vision Baseline Vision/History: No visual deficits Vision Assessment?: Vision impaired- to be further tested in functional context Additional Comments: Able to track and scan - no nystagmus noted     Perception     Praxis      Pertinent Vitals/Pain Pain Assessment: 0-10 Pain Score: 6  Pain Location: back with movement Pain  Descriptors / Indicators: Burning;Sharp;Shooting Pain Intervention(s): Limited activity within patient's tolerance;Repositioned;Monitored during session     Hand Dominance Right   Extremity/Trunk Assessment Upper Extremity Assessment Upper Extremity Assessment: Generalized weakness   Lower Extremity Assessment Lower Extremity Assessment: Generalized weakness   Cervical / Trunk  Assessment Cervical / Trunk Assessment: Normal   Communication Communication Communication: No difficulties   Cognition Arousal/Alertness: Awake/alert Behavior During Therapy: WFL for tasks assessed/performed Overall Cognitive Status: Within Functional Limits for tasks assessed                                     General Comments  Pt's dizziness reported as 2-3/10  and back pain 6/10 with movement. negative CT C, T and L spine- found only severe neural foraminal narrowing in L -spine.    Exercises     Shoulder Instructions      Home Living Family/patient expects to be discharged to:: Private residence Living Arrangements: Parent Available Help at Discharge: Family Type of Home: House Home Access: Stairs to enter Technical brewer of Steps: 4 Entrance Stairs-Rails: Right Home Layout: Multi-level;Able to live on main level with bedroom/bathroom     Bathroom Shower/Tub: Tub/shower unit;Curtain   Bathroom Toilet: Standard Bathroom Accessibility: Yes   Home Equipment: None          Prior Functioning/Environment Level of Independence: Independent        Comments: low back pain limiting activity tolerance        OT Problem List: Decreased strength;Decreased activity tolerance;Impaired balance (sitting and/or standing);Decreased safety awareness;Pain      OT Treatment/Interventions: Self-care/ADL training;Therapeutic exercise;Neuromuscular education;Energy conservation;DME and/or AE instruction;Therapeutic activities;Patient/family education;Balance training    OT Goals(Current goals can be found in the care plan section) Acute Rehab OT Goals Patient Stated Goal: to go home with less pain OT Goal Formulation: With patient Potential to Achieve Goals: Good ADL Goals Pt Will Perform Lower Body Dressing: with set-up;with adaptive equipment Additional ADL Goal #1: pt will be able to state 3 energy conservation techniques.  OT Frequency: Min  2X/week   Barriers to D/C:            Co-evaluation              AM-PAC OT "6 Clicks" Daily Activity     Outcome Measure Help from another person eating meals?: None Help from another person taking care of personal grooming?: None Help from another person toileting, which includes using toliet, bedpan, or urinal?: A Little Help from another person bathing (including washing, rinsing, drying)?: A Lot Help from another person to put on and taking off regular upper body clothing?: A Little Help from another person to put on and taking off regular lower body clothing?: A Lot 6 Click Score: 18   End of Session Equipment Utilized During Treatment: Rolling walker;Gait belt Nurse Communication: Mobility status  Activity Tolerance: Patient tolerated treatment well;Patient limited by pain Patient left: in chair;with call bell/phone within reach  OT Visit Diagnosis: Unsteadiness on feet (R26.81);Muscle weakness (generalized) (M62.81)                Time: 7616-0737 OT Time Calculation (min): 30 min Charges:  OT General Charges $OT Visit: 1 Visit OT Evaluation $OT Eval Moderate Complexity: 1 Mod OT Treatments $Self Care/Home Management : 8-22 mins  Ebony Hail Harold Hedge) Marsa Aris OTR/L Acute Rehabilitation Services Pager: [phone] Office: [phone]   Fredda Hammed 09/06/2018, 11:03 AM

## 2018-09-06 NOTE — Progress Notes (Signed)
Pain better controlled today Plan: Patient to be re-evaled by PT prior to d/c -referral placed to Dr. Ollen Bowl for follow up and consideration of steroids injections vs other non-surgical options JV

## 2018-09-06 NOTE — Care Management Note (Addendum)
Case Management Note  Patient Details  Name: BANELLY ROSENBURG MRN: [id number] Date of Birth: July 02, 1981  Subjective/Objective:                    Action/Plan:  Alvino Chapel with [facility] accepted home health referral. Patient updated. Home walker and 3 in 1 at bedside. Expected Discharge Date:                  Expected Discharge Plan:  Home w Home Health Services  In-House Referral:  NA  Discharge planning Services  CM Consult  Post Acute Care Choice:  Durable Medical Equipment, Home Health Choice offered to:  Patient  DME Arranged:  3-N-1, Walker rolling DME Agency:  [facility]  HH Arranged:  PT, OT HH Agency:  [facility]  Status of Service:  Completed, signed off  If discussed at Long Length of Stay Meetings, dates discussed:    Additional Comments:  Kingsley Plan, RN 09/06/2018, 8:52 AM

## 2018-09-07 DIAGNOSIS — M47817 Spondylosis without myelopathy or radiculopathy, lumbosacral region: Secondary | ICD-10-CM | POA: Diagnosis not present

## 2018-09-07 DIAGNOSIS — M4807 Spinal stenosis, lumbosacral region: Secondary | ICD-10-CM | POA: Diagnosis not present

## 2018-09-07 DIAGNOSIS — J45909 Unspecified asthma, uncomplicated: Secondary | ICD-10-CM | POA: Diagnosis not present

## 2018-09-10 DIAGNOSIS — M4807 Spinal stenosis, lumbosacral region: Secondary | ICD-10-CM | POA: Diagnosis not present

## 2018-09-10 DIAGNOSIS — M47817 Spondylosis without myelopathy or radiculopathy, lumbosacral region: Secondary | ICD-10-CM | POA: Diagnosis not present

## 2018-09-10 DIAGNOSIS — J45909 Unspecified asthma, uncomplicated: Secondary | ICD-10-CM | POA: Diagnosis not present

## 2018-09-12 DIAGNOSIS — M47817 Spondylosis without myelopathy or radiculopathy, lumbosacral region: Secondary | ICD-10-CM | POA: Diagnosis not present

## 2018-09-12 DIAGNOSIS — M5441 Lumbago with sciatica, right side: Secondary | ICD-10-CM | POA: Diagnosis not present

## 2018-09-12 DIAGNOSIS — M4807 Spinal stenosis, lumbosacral region: Secondary | ICD-10-CM | POA: Diagnosis not present

## 2018-09-12 DIAGNOSIS — Z09 Encounter for follow-up examination after completed treatment for conditions other than malignant neoplasm: Secondary | ICD-10-CM | POA: Diagnosis not present

## 2018-09-12 DIAGNOSIS — J45909 Unspecified asthma, uncomplicated: Secondary | ICD-10-CM | POA: Diagnosis not present

## 2018-09-14 DIAGNOSIS — J45909 Unspecified asthma, uncomplicated: Secondary | ICD-10-CM | POA: Diagnosis not present

## 2018-09-14 DIAGNOSIS — M4807 Spinal stenosis, lumbosacral region: Secondary | ICD-10-CM | POA: Diagnosis not present

## 2018-09-14 DIAGNOSIS — M47817 Spondylosis without myelopathy or radiculopathy, lumbosacral region: Secondary | ICD-10-CM | POA: Diagnosis not present

## 2018-09-17 DIAGNOSIS — J45909 Unspecified asthma, uncomplicated: Secondary | ICD-10-CM | POA: Diagnosis not present

## 2018-09-17 DIAGNOSIS — M4807 Spinal stenosis, lumbosacral region: Secondary | ICD-10-CM | POA: Diagnosis not present

## 2018-09-17 DIAGNOSIS — M47817 Spondylosis without myelopathy or radiculopathy, lumbosacral region: Secondary | ICD-10-CM | POA: Diagnosis not present

## 2018-09-20 DIAGNOSIS — M47817 Spondylosis without myelopathy or radiculopathy, lumbosacral region: Secondary | ICD-10-CM | POA: Diagnosis not present

## 2018-09-20 DIAGNOSIS — J45909 Unspecified asthma, uncomplicated: Secondary | ICD-10-CM | POA: Diagnosis not present

## 2018-09-20 DIAGNOSIS — M4807 Spinal stenosis, lumbosacral region: Secondary | ICD-10-CM | POA: Diagnosis not present

## 2018-09-25 DIAGNOSIS — J4521 Mild intermittent asthma with (acute) exacerbation: Secondary | ICD-10-CM | POA: Diagnosis not present

## 2018-09-25 DIAGNOSIS — J029 Acute pharyngitis, unspecified: Secondary | ICD-10-CM | POA: Diagnosis not present

## 2018-09-27 ENCOUNTER — Other Ambulatory Visit: Payer: Self-pay

## 2018-09-27 ENCOUNTER — Ambulatory Visit (INDEPENDENT_AMBULATORY_CARE_PROVIDER_SITE_OTHER): Payer: 59 | Admitting: Psychiatry

## 2018-09-27 ENCOUNTER — Encounter: Payer: Self-pay | Admitting: Psychiatry

## 2018-09-27 DIAGNOSIS — F5105 Insomnia due to other mental disorder: Secondary | ICD-10-CM

## 2018-09-27 DIAGNOSIS — F4001 Agoraphobia with panic disorder: Secondary | ICD-10-CM

## 2018-09-27 DIAGNOSIS — F325 Major depressive disorder, single episode, in full remission: Secondary | ICD-10-CM

## 2018-09-27 MED ORDER — PAROXETINE HCL 30 MG PO TABS: 60.0000 mg | ORAL_TABLET | Freq: Every day | ORAL | 1 refills | Status: DC

## 2018-09-27 MED ORDER — ALPRAZOLAM 0.5 MG PO TABS: 0.5000 mg | ORAL_TABLET | Freq: Three times a day (TID) | ORAL | 1 refills | Status: DC | PRN

## 2018-09-27 NOTE — Progress Notes (Signed)
Carolyn Castro [id number] 1980-08-14 38 y.o.  Subjective:   Patient ID:  Carolyn Castro is a 38 y.o. (DOB 05/08/1981) female.  Chief Complaint:  Chief Complaint  Patient presents with  . Anxiety  . Depression  . Follow-up    med mgmt  . Sleeping Problem    HPI  Carolyn Castro presents to the office today for follow-up of panic disorder and depression.    Last seen August 23, 2018.  For continued severe panic and mild to moderate depression we increased the paroxetine from 40 to 60 mg daily.  We also initiated a trial of trazodone for sleep.  Insurance wouldn't let her increase the paroxetine bc it was too early.    She got better after change at last visit from sertraline to paroxetine 40.  [place].  Surgery for ovarian cysts recently. This created more anxiety.  Complications of cellulitis.  3 rounds of antibiotics then wound clinic for weeks.  Still some ovarian pain and has another cyst discovered with pain.  Back pain and other medical problems are stressful.  Faith helps.  Worked a lot better.  Feels less down.  Sleeps better with  trazodone.  Better motivation.  Less dread of work.  More normal interest and motivation and activity.  Lighter and more hopeful.  Panic reduced to 1/2 weeks.  Was having 3-4/week.  On paxil 40 for 9 weeks.  Still having panic attacks and would like to increase the paroxetine.   Taking the Xanax mid day and pm.    Having panic attacks again at least twice daily.  Which worsens her asthma.  Gets in a frenzy with shakes and SOB and crying.  PCP gave her hydroxyzine 50 helped some, not fully satisfied with it.  Past psych meds:  Zoloft 200 NR, , paxil 40, Xanax., hydroxyzine, trazodone helped.  Review of Systems:  Review of Systems  Respiratory: Positive for shortness of breath and wheezing. Negative for cough and chest tightness.   Gastrointestinal: Positive for abdominal pain.  Musculoskeletal: Positive for back pain.   Neurological: Negative for tremors and weakness.  Psychiatric/Behavioral: Negative for agitation, behavioral problems, confusion, decreased concentration, dysphoric mood, hallucinations, self-injury, sleep disturbance and suicidal ideas. The patient is not nervous/anxious and is not hyperactive.   asthma about the same.  Medications: I have reviewed the patient's current medications.  Current Outpatient Medications  Medication Sig Dispense Refill  . albuterol (ACCUNEB) 0.63 MG/3ML nebulizer solution Take 1 ampule by nebulization every 6 (six) hours as needed for wheezing.    Marland Kitchen albuterol (VENTOLIN HFA) 108 (90 Base) MCG/ACT inhaler Inhale 2 puffs into the lungs every 6 (six) hours as needed for wheezing or shortness of breath.    . ALPRAZolam (XANAX) 0.5 MG tablet Take 1 tablet (0.5 mg total) by mouth 3 (three) times daily as needed for anxiety. (Patient taking differently: Take 0.5 mg by mouth 2 (two) times daily as needed for anxiety. ) 90 tablet 1  . diphenhydrAMINE (BENADRYL) 25 MG tablet Take 50 mg by mouth as needed for itching or allergies.     Tery Sanfilippo Sodium (STOOL SOFTENER) 100 MG capsule Take 100 mg by mouth 2 (two) times daily.    . fexofenadine (ALLEGRA) 180 MG tablet Take 180 mg by mouth daily.    Marland Kitchen ibuprofen (ADVIL,MOTRIN) 200 MG tablet Take 800 mg by mouth every 6 (six) hours as needed (for pain).     Marland Kitchen levothyroxine (SYNTHROID, LEVOTHROID) 75 MCG tablet Take 75 mcg  by mouth daily before breakfast.    . mometasone (ASMANEX) 220 MCG/INH inhaler Inhale 2 puffs into the lungs 2 (two) times daily.     . mometasone (NASONEX) 50 MCG/ACT nasal spray Place 2 sprays into the nose daily.    . montelukast (SINGULAIR) 10 MG tablet Take 10 mg by mouth every evening.     . norethindrone-ethinyl estradiol (MICROGESTIN,JUNEL,LOESTRIN) 1-20 MG-MCG tablet Take 1 tablet by mouth daily.    Marland Kitchen omeprazole (PRILOSEC OTC) 20 MG tablet Take 20 mg by mouth 2 (two) times daily.     . ondansetron (ZOFRAN  ODT) 4 MG disintegrating tablet Take 1 tablet (4 mg total) by mouth every 8 (eight) hours as needed for nausea or vomiting. 5 tablet 0  . traZODone (DESYREL) 50 MG tablet Take 1-2 tablets (50-100 mg total) by mouth at bedtime. 60 tablet 1  . acetaminophen (TYLENOL) 500 MG tablet Take 1,000 mg by mouth every 6 (six) hours as needed (for pain).     . CRANBERRY PO Take 1 tablet by mouth 2 (two) times daily.      No current facility-administered medications for this visit.     Medication Side Effects: None  Allergies:  Allergies  Allergen Reactions  . Ciprofloxacin Hcl Diarrhea and Other (See Comments)    Developed C-Diff    Past Medical History:  Diagnosis Date  . Anxiety   . Asthma   . Hypothyroid     History reviewed. No pertinent family history.  Social History   Socioeconomic History  . Marital status: Single    Spouse name: Not on file  . Number of children: Not on file  . Years of education: Not on file  . Highest education level: Not on file  Occupational History  . Not on file  Social Needs  . Financial resource strain: Not on file  . Food insecurity:    Worry: Not on file    Inability: Not on file  . Transportation needs:    Medical: Not on file    Non-medical: Not on file  Tobacco Use  . Smoking status: Never Smoker  . Smokeless tobacco: Never Used  Substance and Sexual Activity  . Alcohol use: Never    Frequency: Never  . Drug use: Never  . Sexual activity: Not Currently  Lifestyle  . Physical activity:    Days per week: Not on file    Minutes per session: Not on file  . Stress: Not on file  Relationships  . Social connections:    Talks on phone: Not on file    Gets together: Not on file    Attends religious service: Not on file    Active member of club or organization: Not on file    Attends meetings of clubs or organizations: Not on file    Relationship status: Not on file  . Intimate partner violence:    Fear of current or ex partner: Not  on file    Emotionally abused: Not on file    Physically abused: Not on file    Forced sexual activity: Not on file  Other Topics Concern  . Not on file  Social History Narrative  . Not on file    Past Medical History, Surgical history, Social history, and Family history were reviewed and updated as appropriate.   Please see review of systems for further details on the patient's review from today.   Objective:   Physical Exam:  There were no vitals taken for this  visit.  Physical Exam Constitutional:      General: She is not in acute distress.    Appearance: She is well-developed.  Neurological:     Mental Status: She is alert and oriented to person, place, and time.     Cranial Nerves: No dysarthria.  Psychiatric:        Attention and Perception: She is attentive. She does not perceive auditory hallucinations.        Mood and Affect: Mood is anxious. Mood is not depressed. Affect is not labile, blunt, angry or inappropriate.        Speech: Speech normal. Speech is not slurred.        Behavior: Behavior normal. Behavior is not slowed.        Thought Content: Thought content normal. Thought content does not include homicidal or suicidal ideation. Thought content does not include homicidal or suicidal plan.        Cognition and Memory: Cognition normal.        Judgment: Judgment normal.     Comments: Insight intact. No auditory or visual hallucinations. Less depressed 3/10 and still anxious. Anxiety more manageable with less Xananax.     Lab Review:     Component Value Date/Time   NA 137 09/05/2018 0146   K 4.1 09/05/2018 0146   CL 108 09/05/2018 0146   CO2 22 09/05/2018 0146   GLUCOSE 123 (H) 09/05/2018 0146   BUN 11 09/05/2018 0146   CREATININE 1.02 (H) 09/05/2018 0146   CALCIUM 8.3 (L) 09/05/2018 0146   PROT 6.8 08/15/2018 1523   ALBUMIN 3.5 08/15/2018 1523   AST 16 08/15/2018 1523   ALT 20 08/15/2018 1523   ALKPHOS 82 08/15/2018 1523   BILITOT 0.5 08/15/2018  1523   GFRNONAA >60 09/05/2018 0146   GFRAA >60 09/05/2018 0146       Component Value Date/Time   WBC 10.9 (H) 09/05/2018 0146   RBC 4.21 09/05/2018 0146   HGB 11.9 (L) 09/05/2018 0146   HCT 37.3 09/05/2018 0146   PLT 367 09/05/2018 0146   MCV 88.6 09/05/2018 0146   MCH 28.3 09/05/2018 0146   MCHC 31.9 09/05/2018 0146   RDW 15.7 (H) 09/05/2018 0146   LYMPHSABS 1.0 09/04/2018 1532   MONOABS 0.9 09/04/2018 1532   EOSABS 0.0 09/04/2018 1532   BASOSABS 0.0 09/04/2018 1532    No results found for: POCLITH, LITHIUM   No results found for: PHENYTOIN, PHENOBARB, VALPROATE, CBMZ   .res Assessment: Plan:    Panic disorder with agoraphobia  Major depressive disorder with single episode, in full remission (HCC)  Insomnia due to mental condition    History of severe panic disorder and mild to moderate major depression which did not respond to sertraline 200 mg a day.  She tolerated the switch to 40 mg paroxetine well.  She is had an good response so far with a dramatic reduction in panic attacks. With the recent worsening health problems she's had a resurgence of panic.  Will increase the paroxetine to greater than usual dose to 60 to try to limit the need of Bz.  This was intended to happen last visit but did not because her insurance refused to refill the prescription early which seems odd because it was a dosage change.  I will change the prescription to 2 of the 30 mg tablets and send a note to the pharmacy telling them to please refill immediately.  She uses Xanax as needed and there is no evidence of  abuse.  Will increase to TID and hope that we can reduce it again soon when the Paxil increase helps.  recommend drop Xanax dose as soon as the anxiety improves.  We discussed the short-term risks associated with benzodiazepines including sedation and increased fall risk among others.  Discussed long-term side effect risk including dependence, potential withdrawal symptoms, and the  potential eventual dose-related risk of dementia.  Disc risk with opiates.  She is not planning on consistent opiates.  Disc risk of increasing the Xanax.  For sleep continue trazodone 50-100 mg HS.  This appointment was 15 minutes  Follow-up 6-8 weeks  Meredith Staggersarey Cottle MD, DFAPA Please see After Visit Summary for patient specific instructions.  No future appointments.  No orders of the defined types were placed in this encounter.     -------------------------------

## 2018-10-09 DIAGNOSIS — M545 Low back pain: Secondary | ICD-10-CM | POA: Diagnosis not present

## 2018-10-09 DIAGNOSIS — J0101 Acute recurrent maxillary sinusitis: Secondary | ICD-10-CM | POA: Diagnosis not present

## 2018-10-09 DIAGNOSIS — M4317 Spondylolisthesis, lumbosacral region: Secondary | ICD-10-CM | POA: Diagnosis not present

## 2018-10-14 ENCOUNTER — Other Ambulatory Visit: Payer: Self-pay | Admitting: Psychiatry

## 2018-10-15 DIAGNOSIS — M5441 Lumbago with sciatica, right side: Secondary | ICD-10-CM | POA: Diagnosis not present

## 2018-10-22 DIAGNOSIS — K219 Gastro-esophageal reflux disease without esophagitis: Secondary | ICD-10-CM | POA: Diagnosis not present

## 2018-10-22 DIAGNOSIS — M545 Low back pain: Secondary | ICD-10-CM | POA: Diagnosis not present

## 2018-10-22 DIAGNOSIS — E669 Obesity, unspecified: Secondary | ICD-10-CM | POA: Diagnosis not present

## 2018-10-31 DIAGNOSIS — R5382 Chronic fatigue, unspecified: Secondary | ICD-10-CM | POA: Diagnosis not present

## 2018-10-31 DIAGNOSIS — R05 Cough: Secondary | ICD-10-CM | POA: Diagnosis not present

## 2018-11-02 DIAGNOSIS — R5382 Chronic fatigue, unspecified: Secondary | ICD-10-CM | POA: Diagnosis not present

## 2018-11-02 DIAGNOSIS — E039 Hypothyroidism, unspecified: Secondary | ICD-10-CM | POA: Diagnosis not present

## 2018-11-05 ENCOUNTER — Other Ambulatory Visit: Payer: Self-pay | Admitting: Psychiatry

## 2018-11-05 DIAGNOSIS — F4001 Agoraphobia with panic disorder: Secondary | ICD-10-CM

## 2018-11-05 DIAGNOSIS — F325 Major depressive disorder, single episode, in full remission: Secondary | ICD-10-CM

## 2018-11-06 DIAGNOSIS — E039 Hypothyroidism, unspecified: Secondary | ICD-10-CM | POA: Diagnosis not present

## 2018-11-06 DIAGNOSIS — R5382 Chronic fatigue, unspecified: Secondary | ICD-10-CM | POA: Diagnosis not present

## 2018-11-13 ENCOUNTER — Other Ambulatory Visit: Payer: Self-pay | Admitting: Psychiatry

## 2018-11-13 DIAGNOSIS — M4317 Spondylolisthesis, lumbosacral region: Secondary | ICD-10-CM | POA: Diagnosis not present

## 2018-11-19 DIAGNOSIS — Z3041 Encounter for surveillance of contraceptive pills: Secondary | ICD-10-CM | POA: Diagnosis not present

## 2018-11-19 DIAGNOSIS — N83201 Unspecified ovarian cyst, right side: Secondary | ICD-10-CM | POA: Diagnosis not present

## 2018-11-27 ENCOUNTER — Other Ambulatory Visit: Payer: Self-pay

## 2018-11-27 ENCOUNTER — Encounter: Payer: Self-pay | Admitting: Psychiatry

## 2018-11-27 ENCOUNTER — Ambulatory Visit (INDEPENDENT_AMBULATORY_CARE_PROVIDER_SITE_OTHER): Payer: 59 | Admitting: Psychiatry

## 2018-11-27 DIAGNOSIS — F325 Major depressive disorder, single episode, in full remission: Secondary | ICD-10-CM | POA: Diagnosis not present

## 2018-11-27 DIAGNOSIS — F5105 Insomnia due to other mental disorder: Secondary | ICD-10-CM | POA: Diagnosis not present

## 2018-11-27 DIAGNOSIS — F4001 Agoraphobia with panic disorder: Secondary | ICD-10-CM | POA: Diagnosis not present

## 2018-11-27 NOTE — Progress Notes (Signed)
Carolyn Castro [id number] 1981-05-13 38 y.o.  Virtual Visit via Telephone Note  I connected with pt by telephone and verified that I am speaking with the correct person using two identifiers.   I discussed the limitations, risks, security and privacy concerns of performing an evaluation and management service by telephone and the availability of in person appointments. I also discussed with the patient that there may be a patient responsible charge related to this service. The patient expressed understanding and agreed to proceed.  I discussed the assessment and treatment plan with the patient. The patient was provided an opportunity to ask questions and all were answered. The patient agreed with the plan and demonstrated an understanding of the instructions.   The patient was advised to call back or seek an in-person evaluation if the symptoms worsen or if the condition fails to improve as anticipated.  I provided 15 minutes of non-face-to-face time during this encounter. The call started at 4:00 and ended at 450. The patient was located at work and the provider was located office.  Subjective:   Patient ID:  Carolyn Castro is a 38 y.o. (DOB 11/22/1980) female.  Chief Complaint:  Chief Complaint  Patient presents with  . Anxiety    med mangement    HPI Carolyn Castro presents to the office today for follow-up of panic disorder and depression.    Last seen September 26, 2017 she had previously been switched from sertraline to paroxetine 40 mg for panic attacks.  She had a good response until a recent bout of health problems had noticed a resurgence of panic.  We decided to increase paroxetine to greater than the usual 60 mg daily from 40 mg daily on August 23, 2018.    Health gotten better and anxiety has come down.  Increase in paroxetine has helped her also. So much better than the Zoloft for her anxiety.  Prayer life helpling more.   Anxiety back to once in a blue moon with  get jittery for now reason.  Not a real problem.  Xanax use once every 2-3 weeks. Sleep 8-9 hours and is perfect for her.   Less back pain helped.  Worked a lot better.  Feels less down.  Sleeps better with  trazodone.  Better motivation.  Less dread of work. Work function is good.  More normal interest and motivation and activity.  Lighter and more hopeful.    Past psych meds:  Zoloft 200 NR, , paxil 60, Xanax., hydroxyzine, trazodone helped.  Review of Systems:  Review of Systems  Respiratory: Positive for shortness of breath and wheezing. Negative for cough and chest tightness.   Gastrointestinal: Positive for abdominal pain.  Musculoskeletal: Positive for back pain.  Neurological: Negative for tremors and weakness.  Psychiatric/Behavioral: Negative for agitation, behavioral problems, confusion, decreased concentration, dysphoric mood, hallucinations, self-injury, sleep disturbance and suicidal ideas. The patient is not nervous/anxious and is not hyperactive.   asthma about the same.  Medications: I have reviewed the patient's current medications.  Current Outpatient Medications  Medication Sig Dispense Refill  . acetaminophen (TYLENOL) 500 MG tablet Take 1,000 mg by mouth every 6 (six) hours as needed (for pain).     Marland Kitchen albuterol (ACCUNEB) 0.63 MG/3ML nebulizer solution Take 1 ampule by nebulization every 6 (six) hours as needed for wheezing.    Marland Kitchen albuterol (VENTOLIN HFA) 108 (90 Base) MCG/ACT inhaler Inhale 2 puffs into the lungs every 6 (six) hours as needed for wheezing or shortness of  breath.    . ALPRAZolam (XANAX) 0.5 MG tablet Take 1 tablet (0.5 mg total) by mouth 3 (three) times daily as needed for anxiety. 90 tablet 1  . CRANBERRY PO Take 1 tablet by mouth 2 (two) times daily.     . diphenhydrAMINE (BENADRYL) 25 MG tablet Take 50 mg by mouth as needed for itching or allergies.     Tery Sanfilippo. Docusate Sodium (STOOL SOFTENER) 100 MG capsule Take 100 mg by mouth 2 (two) times daily.    .  fexofenadine (ALLEGRA) 180 MG tablet Take 180 mg by mouth daily.    Marland Kitchen. ibuprofen (ADVIL,MOTRIN) 200 MG tablet Take 800 mg by mouth every 6 (six) hours as needed (for pain).     Marland Kitchen. levothyroxine (SYNTHROID, LEVOTHROID) 75 MCG tablet Take 75 mcg by mouth daily before breakfast.    . mometasone (ASMANEX) 220 MCG/INH inhaler Inhale 2 puffs into the lungs 2 (two) times daily.     . mometasone (NASONEX) 50 MCG/ACT nasal spray Place 2 sprays into the nose daily.    . montelukast (SINGULAIR) 10 MG tablet Take 10 mg by mouth every evening.     . norethindrone-ethinyl estradiol (MICROGESTIN,JUNEL,LOESTRIN) 1-20 MG-MCG tablet Take 1 tablet by mouth daily.    Marland Kitchen. omeprazole (PRILOSEC OTC) 20 MG tablet Take 20 mg by mouth 2 (two) times daily.     . ondansetron (ZOFRAN ODT) 4 MG disintegrating tablet Take 1 tablet (4 mg total) by mouth every 8 (eight) hours as needed for nausea or vomiting. 5 tablet 0  . PARoxetine (PAXIL) 30 MG tablet Take 2 tablets (60 mg total) by mouth daily. 60 tablet 1  . traZODone (DESYREL) 50 MG tablet TAKE 1 TO 2 TABLETS(50 TO 100 MG) BY MOUTH AT BEDTIME 60 tablet 3   No current facility-administered medications for this visit.     Medication Side Effects: None  Allergies:  Allergies  Allergen Reactions  . Ciprofloxacin Hcl Diarrhea and Other (See Comments)    Developed C-Diff    Past Medical History:  Diagnosis Date  . Anxiety   . Asthma   . Hypothyroid     No family history on file.  Social History   Socioeconomic History  . Marital status: Single    Spouse name: Not on file  . Number of children: Not on file  . Years of education: Not on file  . Highest education level: Not on file  Occupational History  . Not on file  Social Needs  . Financial resource strain: Not on file  . Food insecurity:    Worry: Not on file    Inability: Not on file  . Transportation needs:    Medical: Not on file    Non-medical: Not on file  Tobacco Use  . Smoking status: Never  Smoker  . Smokeless tobacco: Never Used  Substance and Sexual Activity  . Alcohol use: Never    Frequency: Never  . Drug use: Never  . Sexual activity: Not Currently  Lifestyle  . Physical activity:    Days per week: Not on file    Minutes per session: Not on file  . Stress: Not on file  Relationships  . Social connections:    Talks on phone: Not on file    Gets together: Not on file    Attends religious service: Not on file    Active member of club or organization: Not on file    Attends meetings of clubs or organizations: Not on file  Relationship status: Not on file  . Intimate partner violence:    Fear of current or ex partner: Not on file    Emotionally abused: Not on file    Physically abused: Not on file    Forced sexual activity: Not on file  Other Topics Concern  . Not on file  Social History Narrative  . Not on file    Past Medical History, Surgical history, Social history, and Family history were reviewed and updated as appropriate.   Please see review of systems for further details on the patient's review from today.   Objective:   Physical Exam:  There were no vitals taken for this visit.  Physical Exam Constitutional:      General: She is not in acute distress.    Appearance: She is well-developed.  Neurological:     Mental Status: She is alert and oriented to person, place, and time.     Cranial Nerves: No dysarthria.  Psychiatric:        Attention and Perception: She is attentive. She does not perceive auditory hallucinations.        Mood and Affect: Mood is anxious. Mood is not depressed. Affect is not labile, blunt, angry or inappropriate.        Speech: Speech normal. Speech is not slurred.        Behavior: Behavior normal. Behavior is not slowed.        Thought Content: Thought content normal. Thought content does not include homicidal or suicidal ideation. Thought content does not include homicidal or suicidal plan.        Cognition and  Memory: Cognition normal.        Judgment: Judgment normal.     Comments: Insight intact. No auditory or visual hallucinations. Anxiety is not perfect but under control. No depression.  Feels like herself.     Lab Review:     Component Value Date/Time   NA 137 09/05/2018 0146   K 4.1 09/05/2018 0146   CL 108 09/05/2018 0146   CO2 22 09/05/2018 0146   GLUCOSE 123 (H) 09/05/2018 0146   BUN 11 09/05/2018 0146   CREATININE 1.02 (H) 09/05/2018 0146   CALCIUM 8.3 (L) 09/05/2018 0146   PROT 6.8 08/15/2018 1523   ALBUMIN 3.5 08/15/2018 1523   AST 16 08/15/2018 1523   ALT 20 08/15/2018 1523   ALKPHOS 82 08/15/2018 1523   BILITOT 0.5 08/15/2018 1523   GFRNONAA >60 09/05/2018 0146   GFRAA >60 09/05/2018 0146       Component Value Date/Time   WBC 10.9 (H) 09/05/2018 0146   RBC 4.21 09/05/2018 0146   HGB 11.9 (L) 09/05/2018 0146   HCT 37.3 09/05/2018 0146   PLT 367 09/05/2018 0146   MCV 88.6 09/05/2018 0146   MCH 28.3 09/05/2018 0146   MCHC 31.9 09/05/2018 0146   RDW 15.7 (H) 09/05/2018 0146   LYMPHSABS 1.0 09/04/2018 1532   MONOABS 0.9 09/04/2018 1532   EOSABS 0.0 09/04/2018 1532   BASOSABS 0.0 09/04/2018 1532    No results found for: POCLITH, LITHIUM   No results found for: PHENYTOIN, PHENOBARB, VALPROATE, CBMZ   .res Assessment: Plan:    Panic disorder with agoraphobia  Major depressive disorder with single episode, in full remission (HCC)  Insomnia due to mental condition    History of severe panic disorder and mild to moderate major depression which did not respond to sertraline 200 mg a day.  She tolerated the switch and  Increase of  the paroxetine to greater than usual dose at 60 to try to limit the need of Bz.  Increase in paroxetine was effective at further reducing anxiety below the level she had at 40 mg a day.  She has been able to reduce the alprazolam to just 1 a week or less. She uses Xanax as needed and there is no evidence of abuse. Using it  sparing.  We discussed the short-term risks associated with benzodiazepines including sedation and increased fall risk among others.  Discussed long-term side effect risk including dependence, potential withdrawal symptoms, and the potential eventual dose-related risk of dementia.  Disc risk with opiates.  She is not planning on consistent opiates.  Disc risk of increasing the Xanax.  For sleep continue trazodone 50-100 mg HS.  This appointment was 15 minutes  No indication for med changes.  Follow-up 4 mos.  She agrees with the plan  Meredith Staggers MD, DFAPA  Please see After Visit Summary for patient specific instructions.  No future appointments.  No orders of the defined types were placed in this encounter.     -------------------------------

## 2018-12-11 ENCOUNTER — Other Ambulatory Visit: Payer: Self-pay | Admitting: Psychiatry

## 2018-12-16 ENCOUNTER — Emergency Department (HOSPITAL_BASED_OUTPATIENT_CLINIC_OR_DEPARTMENT_OTHER)
Admission: EM | Admit: 2018-12-16 | Discharge: 2018-12-16 | Disposition: A | Discharge status: Home / Self Care | Payer: 59 | Attending: Emergency Medicine | Admitting: Emergency Medicine

## 2018-12-16 ENCOUNTER — Encounter (HOSPITAL_BASED_OUTPATIENT_CLINIC_OR_DEPARTMENT_OTHER): Payer: Self-pay | Admitting: Emergency Medicine

## 2018-12-16 ENCOUNTER — Other Ambulatory Visit: Payer: Self-pay

## 2018-12-16 DIAGNOSIS — G8929 Other chronic pain: Secondary | ICD-10-CM | POA: Insufficient documentation

## 2018-12-16 DIAGNOSIS — Z79899 Other long term (current) drug therapy: Secondary | ICD-10-CM | POA: Diagnosis not present

## 2018-12-16 DIAGNOSIS — M545 Low back pain, unspecified: Secondary | ICD-10-CM

## 2018-12-16 DIAGNOSIS — J45909 Unspecified asthma, uncomplicated: Secondary | ICD-10-CM | POA: Diagnosis not present

## 2018-12-16 DIAGNOSIS — E039 Hypothyroidism, unspecified: Secondary | ICD-10-CM | POA: Insufficient documentation

## 2018-12-16 DIAGNOSIS — M549 Dorsalgia, unspecified: Secondary | ICD-10-CM | POA: Diagnosis present

## 2018-12-16 HISTORY — DX: Dorsalgia, unspecified: M54.9

## 2018-12-16 LAB — PREGNANCY, URINE: Preg Test, Ur: NEGATIVE

## 2018-12-16 MED ORDER — PREDNISONE 50 MG PO TABS
60.0000 mg | ORAL_TABLET | Freq: Once | ORAL | Status: AC
  Administered 2018-12-16: 60 mg via ORAL
  Filled 2018-12-16: qty 1

## 2018-12-16 MED ORDER — LIDOCAINE 5 % EX PTCH: 1.0000 | MEDICATED_PATCH | CUTANEOUS | 0 refills | Status: DC

## 2018-12-16 MED ORDER — DICLOFENAC SODIUM ER 100 MG PO TB24: 100.0000 mg | ORAL_TABLET | Freq: Every day | ORAL | 0 refills | Status: DC

## 2018-12-16 MED ORDER — DEXAMETHASONE 2 MG PO TABS: 2.0000 mg | ORAL_TABLET | Freq: Two times a day (BID) | ORAL | 0 refills | Status: DC

## 2018-12-16 MED ORDER — KETOROLAC TROMETHAMINE 60 MG/2ML IM SOLN
30.0000 mg | Freq: Once | INTRAMUSCULAR | Status: AC
  Administered 2018-12-16: 30 mg via INTRAMUSCULAR
  Filled 2018-12-16: qty 2

## 2018-12-16 NOTE — ED Provider Notes (Signed)
[facility] EMERGENCY DEPARTMENT Provider Note   CSN: [id number] Arrival date & time: 12/16/18  0451     History   Chief Complaint Chief Complaint  Patient presents with  . back pain and asthma concern    HPI Carolyn Castro is a 38 y.o. female.     The history is provided by the patient.  Wheezing Severity:  Mild Severity compared to prior episodes:  Similar Onset quality:  Gradual Timing:  Intermittent Progression:  Waxing and waning Chronicity:  Recurrent Context: not animal exposure   Relieved by:  Nothing Worsened by:  Nothing Ineffective treatments:  Beta-agonist inhaler and steroid inhaler Associated symptoms: no chest pain, no fatigue, no fever, no orthopnea, no rash, no shortness of breath, no sore throat, no sputum production and no stridor   Risk factors: no prior intubations   Asthma has been bothering her for weeks.  Seen by PMD and urgent care and placed on steroids and 2 different antibiotics.  Just completed Zpak yesterday. Chronic back pain is also bothering her.  No f/c/r.    Past Medical History:  Diagnosis Date  . Anxiety   . Asthma   . Back pain   . Hypothyroid     Patient Active Problem List   Diagnosis Date Noted  . Low back pain 09/04/2018  . Hypothyroidism 09/04/2018  . Infected incision 07/04/2018  . Adnexal mass 06/29/2018  . Pelvic pain 06/29/2018    Past Surgical History:  Procedure Laterality Date  . COLONOSCOPY    . LAPAROSCOPIC OVARIAN CYSTECTOMY Left 06/29/2018   Procedure: LAPAROSCOPIC OVARIAN CYSTECTOMY;  Surgeon: Gerald Leitzole, Tara, MD;  Location: [facility];  Service: Gynecology;  Laterality: Left;  . TONSILLECTOMY    . WISDOM TOOTH EXTRACTION       OB History    Gravida  0   Para  0   Term  0   Preterm  0   AB  0   Living  0     SAB  0   TAB  0   Ectopic  0   Multiple  0   Live Births  0            Home Medications    Prior to Admission medications   Medication Sig  Start Date End Date Taking? Authorizing Provider  acetaminophen (TYLENOL) 500 MG tablet Take 1,000 mg by mouth every 6 (six) hours as needed (for pain).     [provider]  albuterol (ACCUNEB) 0.63 MG/3ML nebulizer solution Take 1 ampule by nebulization every 6 (six) hours as needed for wheezing.    [provider]  albuterol (VENTOLIN HFA) 108 (90 Base) MCG/ACT inhaler Inhale 2 puffs into the lungs every 6 (six) hours as needed for wheezing or shortness of breath.    [provider]  ALPRAZolam Prudy Feeler(XANAX) 0.5 MG tablet Take 1 tablet (0.5 mg total) by mouth 3 (three) times daily as needed for anxiety. 09/27/18   Cottle, Steva Readyarey G Jr., MD  CRANBERRY PO Take 1 tablet by mouth 2 (two) times daily.     [provider]  diphenhydrAMINE (BENADRYL) 25 MG tablet Take 50 mg by mouth as needed for itching or allergies.     [provider]  Docusate Sodium (STOOL SOFTENER) 100 MG capsule Take 100 mg by mouth 2 (two) times daily.    [provider]  fexofenadine (ALLEGRA) 180 MG tablet Take 180 mg by mouth daily.    [provider]  ibuprofen (ADVIL,MOTRIN) 200 MG tablet Take 800 mg by mouth every 6 (six) hours as needed (for pain).     [provider]  levothyroxine (SYNTHROID, LEVOTHROID) 75 MCG tablet Take 75 mcg by mouth daily before breakfast.    [provider]  mometasone (ASMANEX) 220 MCG/INH inhaler Inhale 2 puffs into the lungs 2 (two) times daily.     [provider]  mometasone (NASONEX) 50 MCG/ACT nasal spray Place 2 sprays into the nose daily.    [provider]  montelukast (SINGULAIR) 10 MG tablet Take 10 mg by mouth every evening.     [provider]  norethindrone-ethinyl estradiol (MICROGESTIN,JUNEL,LOESTRIN) 1-20 MG-MCG tablet Take 1 tablet by mouth daily.    [provider]  omeprazole (PRILOSEC OTC) 20 MG tablet Take 20 mg by mouth 2 (two) times daily.     [provider]   ondansetron (ZOFRAN ODT) 4 MG disintegrating tablet Take 1 tablet (4 mg total) by mouth every 8 (eight) hours as needed for nausea or vomiting. 08/15/18   Petrucelli, Samantha R, PA-C  PARoxetine (PAXIL) 30 MG tablet Take 2 tablets (60 mg total) by mouth daily. 09/27/18   Purnell Shoemaker., MD  traZODone (DESYREL) 50 MG tablet TAKE 1 TO 2 TABLETS(50 TO 100 MG) BY MOUTH AT BEDTIME 10/15/18   Cottle, Billey Co., MD    Family History No family history on file.  Social History Social History   Tobacco Use  . Smoking status: Never Smoker  . Smokeless tobacco: Never Used  Substance Use Topics  . Alcohol use: Never    Frequency: Never  . Drug use: Never     Allergies   Ciprofloxacin hcl   Review of Systems Review of Systems  Constitutional: Negative for fatigue and fever.  HENT: Negative for sore throat.   Respiratory: Positive for wheezing. Negative for sputum production, shortness of breath and stridor.   Cardiovascular: Negative for chest pain and orthopnea.  Skin: Negative for rash.     Physical Exam Updated Vital Signs BP (!) 145/110   Temp (!) 97 F (36.1 C)   Resp (!) 22   Ht 5\' 4"  (1.626 m)   Wt 117.9 kg   LMP 12/13/2018   SpO2 99%   BMI 44.63 kg/m   Physical Exam   ED Treatments / Results  Labs (all labs ordered are listed, but only abnormal results are displayed) Labs Reviewed  PREGNANCY, URINE    EKG    Radiology No results found.  Procedures Procedures (including critical care time)  Medications Ordered in ED Medications  predniSONE (DELTASONE) tablet 60 mg (has no administration in time range)  ketorolac (TORADOL) injection 30 mg (has no administration in time range)     Initial Impression / Assessment and Plan / ED Course  Patient walked to the room with a quick and steady gait.  Lungs are clear no active wheezing.  Will start steroids for asthma and back pain which is chronic.  Reviewed DEA database Overdose score is 510.  Patient is  on 2 narcotic pain medications for her pain already.  I will not be writing an additions RX for narcotics.  She is well appearing and stable for discharge.  Recent MRIs were reassuring.     I suspect the patient is out of her narcotics early and drug seeking.   Final Clinical Impressions(s) / ED Diagnoses   Return for intractable cough, coughing up blood,fevers >100.4 unrelieved by medication, shortness of breath,  intractable vomiting, chest pain, shortness of breath, weakness,numbness, changes in speech, facial asymmetry,abdominal pain, passing out,Inability to tolerate liquids or food, cough, altered mental status or any concerns. No signs of systemic illness or infection. The patient is nontoxic-appearing on exam and vital signs are within normal limits.   I have reviewed the triage vital signs and the nursing notes. Pertinent labs &imaging results that were available during my care of the patient were reviewed by me and considered in my medical decision making (see chart for details).  After history, exam, and medical workup I feel the patient has been appropriately medically screened and is safe for discharge home. Pertinent diagnoses were discussed with the patient. Patient was given return precautions      Kritika Stukes, MD 12/16/18 [id number]

## 2018-12-16 NOTE — ED Triage Notes (Addendum)
Pt. Ambulated to the room without difficulty. C/o lower back pain which is chronic and her asthma has been flared up for past "3 weeks" states she has been seen at urgent care for same symptoms. o2 sat 98% on RA no distress. Pt states she has not taken anything for her back pain and has taken her asthma inhalers pta.

## 2018-12-21 ENCOUNTER — Telehealth: Payer: Self-pay | Admitting: Psychiatry

## 2018-12-21 NOTE — Telephone Encounter (Signed)
Tried to leave detailed message with information but will call back Monday to verify she received.

## 2018-12-21 NOTE — Telephone Encounter (Signed)
Patient called and said that she wanted you to be aware that she has severe health issues such as asthma high blood pressure,lowerback pain . She is having tightness in her chest and her stomach is hurting. She has family members who are dying and is very stressed. She wants to know if there is any thing you can help her with. [phone]

## 2018-12-21 NOTE — Telephone Encounter (Signed)
Okay thanks for the information about her getting 1 mg Xanax from the other provider.  Inform her that she cannot do that again otherwise I will no longer be able to prescribe it to her.  She cannot get controlled substances from 2 different providers.  Otherwise I would suggest she increase as recommended and we will just do 1 month supplies at a time and follow closely.

## 2018-12-21 NOTE — Telephone Encounter (Signed)
Patient on paroxetine 60 mg and trazodone 50 to 100 mg nightly and Xanax 0.5 mg 3 times daily as needed anxiety.  Steroids will increase anxiety and interfere with sleep even with additional sedative medication but temporarily she can increase the Xanax to 0.5 mg 3 times daily and 1 mg nightly.  Since she is off the steroids try to cut the dosage back down to the previous level.

## 2018-12-24 ENCOUNTER — Other Ambulatory Visit: Payer: Self-pay

## 2018-12-24 ENCOUNTER — Emergency Department (HOSPITAL_COMMUNITY)
Admission: EM | Admit: 2018-12-24 | Discharge: 2018-12-24 | Disposition: A | Discharge status: Home / Self Care | Payer: 59 | Source: Home / Self Care | Attending: Emergency Medicine | Admitting: Emergency Medicine

## 2018-12-24 ENCOUNTER — Emergency Department (HOSPITAL_COMMUNITY): Payer: 59

## 2018-12-24 DIAGNOSIS — J45909 Unspecified asthma, uncomplicated: Secondary | ICD-10-CM | POA: Insufficient documentation

## 2018-12-24 DIAGNOSIS — E860 Dehydration: Secondary | ICD-10-CM | POA: Insufficient documentation

## 2018-12-24 DIAGNOSIS — Z79899 Other long term (current) drug therapy: Secondary | ICD-10-CM | POA: Insufficient documentation

## 2018-12-24 DIAGNOSIS — M5441 Lumbago with sciatica, right side: Secondary | ICD-10-CM

## 2018-12-24 DIAGNOSIS — R0602 Shortness of breath: Secondary | ICD-10-CM | POA: Diagnosis not present

## 2018-12-24 DIAGNOSIS — M4317 Spondylolisthesis, lumbosacral region: Secondary | ICD-10-CM | POA: Diagnosis not present

## 2018-12-24 DIAGNOSIS — E039 Hypothyroidism, unspecified: Secondary | ICD-10-CM | POA: Insufficient documentation

## 2018-12-24 DIAGNOSIS — R072 Precordial pain: Secondary | ICD-10-CM | POA: Insufficient documentation

## 2018-12-24 DIAGNOSIS — M5442 Lumbago with sciatica, left side: Secondary | ICD-10-CM

## 2018-12-24 DIAGNOSIS — M545 Low back pain: Secondary | ICD-10-CM | POA: Insufficient documentation

## 2018-12-24 LAB — BASIC METABOLIC PANEL
Anion gap: 14 (ref 5–15)
BUN: 17 mg/dL (ref 6–20)
CO2: 17 mmol/L — ABNORMAL LOW (ref 22–32)
Calcium: 9.2 mg/dL (ref 8.9–10.3)
Chloride: 106 mmol/L (ref 98–111)
Creatinine, Ser: 1.05 mg/dL — ABNORMAL HIGH (ref 0.44–1.00)
GFR calc Af Amer: >60 mL/min (ref >60)
GFR calc non Af Amer: >60 mL/min (ref >60)
Glucose, Bld: 172 mg/dL — ABNORMAL HIGH (ref 70–99)
Potassium: 4 mmol/L (ref 3.5–5.1)
Sodium: 137 mmol/L (ref 135–145)

## 2018-12-24 LAB — URINALYSIS, ROUTINE W REFLEX MICROSCOPIC
Bacteria, UA: NONE SEEN
Bilirubin Urine: NEGATIVE
Glucose, UA: NEGATIVE mg/dL
Hgb urine dipstick: NEGATIVE
Ketones, ur: NEGATIVE mg/dL
Leukocytes,Ua: NEGATIVE
Nitrite: NEGATIVE
Protein, ur: NEGATIVE mg/dL
Specific Gravity, Urine: 1.013 (ref 1.005–1.030)
pH: 5 (ref 5.0–8.0)

## 2018-12-24 LAB — LIPASE, BLOOD: Lipase: 34 U/L (ref 11–51)

## 2018-12-24 LAB — CBC
HCT: 39.7 % (ref 36.0–46.0)
Hemoglobin: 12.8 g/dL (ref 12.0–15.0)
MCH: 29.2 pg (ref 26.0–34.0)
MCHC: 32.2 g/dL (ref 30.0–36.0)
MCV: 90.6 fL (ref 80.0–100.0)
Platelets: 399 10*3/uL (ref 150–400)
RBC: 4.38 MIL/uL (ref 3.87–5.11)
RDW: 13.2 % (ref 11.5–15.5)
WBC: 10.8 10*3/uL — ABNORMAL HIGH (ref 4.0–10.5)
nRBC: 0 % (ref 0.0–0.2)

## 2018-12-24 LAB — D-DIMER, QUANTITATIVE: D-Dimer, Quant: 0.3 ug/mL-FEU (ref 0.00–0.50)

## 2018-12-24 LAB — TROPONIN I: Troponin I: <0.03 ng/mL (ref <0.03)

## 2018-12-24 LAB — HEPATIC FUNCTION PANEL
ALT: 24 U/L (ref 0–44)
AST: 20 U/L (ref 15–41)
Albumin: 3.4 g/dL — ABNORMAL LOW (ref 3.5–5.0)
Alkaline Phosphatase: 60 U/L (ref 38–126)
Bilirubin, Direct: <0.1 mg/dL (ref 0.0–0.2)
Total Bilirubin: 0.3 mg/dL (ref 0.3–1.2)
Total Protein: 6.6 g/dL (ref 6.5–8.1)

## 2018-12-24 LAB — I-STAT BETA HCG BLOOD, ED (MC, WL, AP ONLY): I-stat hCG, quantitative: <5 m[IU]/mL (ref <5)

## 2018-12-24 MED ORDER — HYDROCODONE-ACETAMINOPHEN 5-325 MG PO TABS: 1.0000 | ORAL_TABLET | Freq: Four times a day (QID) | ORAL | 0 refills | Status: DC | PRN

## 2018-12-24 MED ORDER — SODIUM CHLORIDE 0.9 % IV BOLUS (SEPSIS)
2000.0000 mL | Freq: Once | INTRAVENOUS | Status: AC
  Administered 2018-12-24: 2000 mL via INTRAVENOUS

## 2018-12-24 MED ORDER — KETOROLAC TROMETHAMINE 30 MG/ML IJ SOLN: 30.0000 mg | Freq: Once | INTRAMUSCULAR | Status: DC

## 2018-12-24 MED ORDER — FENTANYL CITRATE (PF) 100 MCG/2ML IJ SOLN
100.0000 ug | Freq: Once | INTRAMUSCULAR | Status: AC
  Administered 2018-12-24: 100 ug via INTRAVENOUS
  Filled 2018-12-24: qty 2

## 2018-12-24 NOTE — Discharge Instructions (Signed)

## 2018-12-24 NOTE — ED Provider Notes (Signed)
[facility] EMERGENCY DEPARTMENT Provider Note   CSN: [id number] Arrival date & time: 12/24/18  0024     History   Chief Complaint Chief Complaint  Patient presents with  . Multiple Complaints    HPI Carolyn Castro is a 38 y.o. female.     The history is provided by the patient.  Back Pain Location:  Lumbar spine Quality:  Aching Pain severity:  Severe Onset quality:  Gradual Timing:  Constant Progression:  Worsening Chronicity:  Recurrent Relieved by:  Nothing Worsened by:  Movement Associated symptoms: chest pain and numbness   Associated symptoms: no bowel incontinence and no fever   Patient presents for multiple complaints.  She has a history of asthma, back pain, hypothyroid. She reports ongoing low back pain for quite some time.  She reports her radiates into her legs and she has numbness in her legs. No fecal incontinence, but at times she does have difficulty holding her urine.  She reports the pain in her back is worse with movement. No history of back surgery, but has had lumbar injections before.  She is not on current pain medications.  Also reports feeling anxious, chest tightness and shortness of breath. She reports getting asthma attacks frequently, but this has continued.  She reports feeling Saran wrap around her chest.  No hemoptysis.  No fever.   Past Medical History:  Diagnosis Date  . Anxiety   . Asthma   . Back pain   . Hypothyroid     Patient Active Problem List   Diagnosis Date Noted  . Low back pain 09/04/2018  . Hypothyroidism 09/04/2018  . Infected incision 07/04/2018  . Adnexal mass 06/29/2018  . Pelvic pain 06/29/2018    Past Surgical History:  Procedure Laterality Date  . COLONOSCOPY    . LAPAROSCOPIC OVARIAN CYSTECTOMY Left 06/29/2018   Procedure: LAPAROSCOPIC OVARIAN CYSTECTOMY;  Surgeon: Christophe Louis, MD;  Location: [facility];  Service: Gynecology;  Laterality: Left;  . TONSILLECTOMY     . WISDOM TOOTH EXTRACTION       OB History    Gravida  0   Para  0   Term  0   Preterm  0   AB  0   Living  0     SAB  0   TAB  0   Ectopic  0   Multiple  0   Live Births  0            Home Medications    Prior to Admission medications   Medication Sig Start Date End Date Taking? Authorizing Provider  acetaminophen (TYLENOL) 500 MG tablet Take 1,000 mg by mouth every 6 (six) hours as needed (for pain).     [provider]  albuterol (ACCUNEB) 0.63 MG/3ML nebulizer solution Take 1 ampule by nebulization every 6 (six) hours as needed for wheezing.    [provider]  albuterol (VENTOLIN HFA) 108 (90 Base) MCG/ACT inhaler Inhale 2 puffs into the lungs every 6 (six) hours as needed for wheezing or shortness of breath.    [provider]  ALPRAZolam Duanne Moron) 0.5 MG tablet Take 1 tablet (0.5 mg total) by mouth 3 (three) times daily as needed for anxiety. 09/27/18   Cottle, Billey Co., MD  CRANBERRY PO Take 1 tablet by mouth 2 (two) times daily.     [provider]  dexamethasone (DECADRON) 2 MG tablet Take 1 tablet (2 mg total) by mouth 2 (two)  times daily. 12/16/18   Palumbo, April, MD  Diclofenac Sodium CR 100 MG 24 hr tablet Take 1 tablet (100 mg total) by mouth daily. 12/16/18   Palumbo, April, MD  diphenhydrAMINE (BENADRYL) 25 MG tablet Take 50 mg by mouth as needed for itching or allergies.     [provider]  Docusate Sodium (STOOL SOFTENER) 100 MG capsule Take 100 mg by mouth 2 (two) times daily.    [provider]  fexofenadine (ALLEGRA) 180 MG tablet Take 180 mg by mouth daily.    [provider]  ibuprofen (ADVIL,MOTRIN) 200 MG tablet Take 800 mg by mouth every 6 (six) hours as needed (for pain).     [provider]  levothyroxine (SYNTHROID, LEVOTHROID) 75 MCG tablet Take 75 mcg by mouth daily before breakfast.    [provider]  lidocaine (LIDODERM) 5 % Place 1 patch onto the skin  daily. Remove & Discard patch within 12 hours or as directed by MD 12/16/18   Palumbo, April, MD  mometasone [facility](ASMANEX) 220 MCG/INH inhaler Inhale 2 puffs into the lungs 2 (two) times daily.     [provider]  mometasone (NASONEX) 50 MCG/ACT nasal spray Place 2 sprays into the nose daily.    [provider]  montelukast (SINGULAIR) 10 MG tablet Take 10 mg by mouth every evening.     [provider]  norethindrone-ethinyl estradiol (MICROGESTIN,JUNEL,LOESTRIN) 1-20 MG-MCG tablet Take 1 tablet by mouth daily.    [provider]  omeprazole (PRILOSEC OTC) 20 MG tablet Take 20 mg by mouth 2 (two) times daily.     [provider]  ondansetron (ZOFRAN ODT) 4 MG disintegrating tablet Take 1 tablet (4 mg total) by mouth every 8 (eight) hours as needed for nausea or vomiting. 08/15/18   Petrucelli, Samantha R, PA-C  PARoxetine (PAXIL) 30 MG tablet Take 2 tablets (60 mg total) by mouth daily. 09/27/18   Lauraine Rinneottle, Carey G Jr., MD  traZODone (DESYREL) 50 MG tablet TAKE 1 TO 2 TABLETS(50 TO 100 MG) BY MOUTH AT BEDTIME 10/15/18   Cottle, Steva Readyarey G Jr., MD    Family History No family history on file.  Social History Social History   Tobacco Use  . Smoking status: Never Smoker  . Smokeless tobacco: Never Used  Substance Use Topics  . Alcohol use: Never    Frequency: Never  . Drug use: Never     Allergies   Ciprofloxacin hcl   Review of Systems Review of Systems  Constitutional: Negative for fever.  Respiratory: Positive for chest tightness and shortness of breath.        Denies hemoptysis   Cardiovascular: Positive for chest pain.  Gastrointestinal: Negative for bowel incontinence.  Musculoskeletal: Positive for back pain.  Neurological: Positive for numbness.  Psychiatric/Behavioral: The patient is nervous/anxious.   All other systems reviewed and are negative.    Physical Exam Updated Vital Signs BP (!) 147/110 (BP Location: Right Arm)   Pulse  (!) 121   Temp (!) 97.3 F (36.3 C) (Oral)   Resp (!) 22   Ht 1.626 m (5\' 4" )   Wt 118 kg   LMP 12/13/2018   SpO2 97%   BMI 44.65 kg/m   Physical Exam CONSTITUTIONAL: Well developed/well nourished, anxious HEAD: Normocephalic/atraumatic EYES: EOMI/PERRL ENMT: Mucous membranes moist NECK: supple no meningeal signs SPINE/BACK:lumbar spinal tenderness, No bruising/crepitance/stepoffs noted to spine CV: S1/S2 noted, no murmurs/rubs/gallops noted, tachycardic LUNGS: Lungs are clear to auscultation bilaterally, no apparent distress, tachypneic  ABDOMEN: soft, nontender, no rebound or guarding GU:no cva tenderness NEURO: Awake/alert,  equal motor 5/5 strength noted with the following: hip flexion/knee flexion/extension, foot dorsi/plantar flexion, great toe extension intact bilaterally, no clonus bilaterally, plantar reflex appropriate (toes downgoing), n Equal patellar/achilles reflex noted  in bilateral lower extremities.  She reports numbness throughout both lower extremities  EXTREMITIES: pulses normal, full ROM, no calf tenderness SKIN: warm, color normal PSYCH: anxious  ED Treatments / Results  Labs (all labs ordered are listed, but only abnormal results are displayed) Labs Reviewed  BASIC METABOLIC PANEL - Abnormal; Notable for the following components:      Result Value   CO2 17 (*)    Glucose, Bld 172 (*)    Creatinine, Ser 1.05 (*)    All other components within normal limits  CBC - Abnormal; Notable for the following components:   WBC 10.8 (*)    All other components within normal limits  HEPATIC FUNCTION PANEL - Abnormal; Notable for the following components:   Albumin 3.4 (*)    All other components within normal limits  URINALYSIS, ROUTINE W REFLEX MICROSCOPIC - Abnormal; Notable for the following components:   Color, Urine STRAW (*)    All other components within normal limits  TROPONIN I  LIPASE, BLOOD  D-DIMER, QUANTITATIVE (NOT [facility])  I-STAT BETA HCG  BLOOD, ED ([facility], [facility], [facility] ONLY)    EKG    Radiology Dg Chest 2 View  Result Date: 12/24/2018 CLINICAL DATA:  Chest pain EXAM: CHEST - 2 VIEW COMPARISON:  None. FINDINGS: The heart size and mediastinal contours are within normal limits. Both lungs are clear. The visualized skeletal structures are unremarkable. IMPRESSION: No active cardiopulmonary disease. Electronically Signed   By: Deatra RobinsonKevin  Herman M.D.   On: 12/24/2018 01:28    Procedures Procedures  ED ECG REPORT   Date: 12/24/2018 0034am  Rate: 119  Rhythm: sinus tachycardia  QRS Axis: normal  Intervals: normal  ST/T Wave abnormalities: nonspecific ST changes  Conduction Disutrbances:none  Narrative Interpretation:   Old EKG Reviewed: heart rate is faster today  I have personally reviewed the EKG tracing and agree with the computerized printout as noted.  Medications Ordered in ED Medications  sodium chloride 0.9 % bolus 2,000 mL (2,000 mLs Intravenous New Bag/Given 12/24/18 0428)  fentaNYL (SUBLIMAZE) injection 100 mcg (100 mcg Intravenous Given 12/24/18 0428)     Initial Impression / Assessment and Plan / ED Course  I have reviewed the triage vital signs and the nursing notes.  Pertinent labs & imaging results that were available during my care of the patient were reviewed by me and considered in my medical decision making (see chart for details).        4:41 AM Patient here for multiple complaints.  She reports her main issue is her low back pain is been ongoing for a while.  No new trauma to back.  She reports subjective numbness in her legs, but there is no focal weakness.  Postvoid residual is approximately 35 ml. No signs of cauda equina or acute myelopathy. MRI March 2020 showed anterolisthesis and foraminal narrowing  Patient appears extremely anxious, she is tachycardic, appears dehydrated by labs. We will plan to treat pain, give IV fluids and reassess. 6:19 AM Patient is improved. She feels pain is  improved, can move her legs with less pain She reports she can ambulate Heart rate improved, down to 104 No acute distress Denies having pain meds at home ,and not abusing/overdosing  on meds She reports she does not get scheduled pain meds She is supposed to see pain specialist soon  Plan will be to give a short course of pain medicine.  She admits that her main issue is just a continued/worsening back pain.  No signs of any acute neurologic emergency.  Final Clinical Impressions(s) / ED Diagnoses   Final diagnoses:  Acute midline low back pain with bilateral sciatica  Dehydration  Precordial pain    ED Discharge Orders         Ordered    HYDROcodone-acetaminophen (NORCO/VICODIN) 5-325 MG tablet  Every 6 hours PRN     12/24/18 0617           Zadie RhineWickline, Jaiyon Wander, MD 12/24/18 [phone]

## 2018-12-24 NOTE — ED Triage Notes (Signed)
Pt c/o "chest pain, tingling/numbness on shoulder left side, vomiting, abdominal pain, falling due to back pain and the biggest thing is my lungs, I feel like my lungs have saran wrap wrapped around them and it feels tight". Has been see for same problems in ED's and had upcoming appts to address these issues but is here for pain control, per pt report.

## 2018-12-24 NOTE — ED Notes (Signed)
Pt verbalized understanding of d/c instructions, medications and follow up 

## 2018-12-25 ENCOUNTER — Encounter: Payer: Self-pay | Admitting: Emergency Medicine

## 2018-12-25 ENCOUNTER — Ambulatory Visit: Payer: 59 | Admitting: Emergency Medicine

## 2018-12-25 VITALS — BP 130/80 | HR 115 | Temp 97.6°F | Ht 64.0 in | Wt 285.0 lb

## 2018-12-25 DIAGNOSIS — G4733 Obstructive sleep apnea (adult) (pediatric): Secondary | ICD-10-CM | POA: Diagnosis not present

## 2018-12-25 DIAGNOSIS — R0683 Snoring: Secondary | ICD-10-CM | POA: Insufficient documentation

## 2018-12-25 DIAGNOSIS — J45909 Unspecified asthma, uncomplicated: Secondary | ICD-10-CM | POA: Insufficient documentation

## 2018-12-25 DIAGNOSIS — J4540 Moderate persistent asthma, uncomplicated: Secondary | ICD-10-CM

## 2018-12-25 MED ORDER — BUDESONIDE-FORMOTEROL FUMARATE 160-4.5 MCG/ACT IN AERO: 2.0000 | INHALATION_SPRAY | Freq: Two times a day (BID) | RESPIRATORY_TRACT | 0 refills | Status: DC

## 2018-12-25 NOTE — Progress Notes (Signed)
Subjective:    Patient ID: Carolyn Castro, female    DOB: 1981-06-23, 38 y.o.   MRN: [id number]  HPI 38 year old never smoker with history of hypothyroidism.  She had childhood asthma and is carried a diagnosis of adult asthma since her 89's. She has been on singulair, asmanex, allegra, nasonex.   She has been having more trouble over the last 6 months - nocturnal awakenings, dyspnea, cough, wheeze. Can also happen during the day. Hears some throat noise occasionally. The episodes are moderately responsive to albuterol. She has DOE at times at work, when walking. She is having breakthrough reflux symptom, especially at night or after dinner. Some breakthrough allergy symptoms even on her meds above. Unsure whether she snores, may have had witnessed apneas before.  Flares about 3x a year. Has gained about 40 lbs over 6 months.   CXR yesterday reviewed > normal   Review of Systems  Constitutional: Positive for unexpected weight change.  HENT: Positive for congestion, ear pain and sneezing.   Respiratory: Positive for cough, chest tightness and shortness of breath.   Cardiovascular: Positive for chest pain.  Gastrointestinal:       Acid Reflux  Neurological: Positive for headaches.    Past Medical History:  Diagnosis Date  . Anxiety   . Asthma   . Back pain   . Hypothyroid      No family history on file.   Social History   Socioeconomic History  . Marital status: Single    Spouse name: Not on file  . Number of children: Not on file  . Years of education: Not on file  . Highest education level: Not on file  Occupational History  . Not on file  Social Needs  . Financial resource strain: Not on file  . Food insecurity    Worry: Not on file    Inability: Not on file  . Transportation needs    Medical: Not on file    Non-medical: Not on file  Tobacco Use  . Smoking status: Never Smoker  . Smokeless tobacco: Never Used  Substance and Sexual Activity  . Alcohol use:  Never    Frequency: Never  . Drug use: Never  . Sexual activity: Not Currently  Lifestyle  . Physical activity    Days per week: Not on file    Minutes per session: Not on file  . Stress: Not on file  Relationships  . Social Herbalist on phone: Not on file    Gets together: Not on file    Attends religious service: Not on file    Active member of club or organization: Not on file    Attends meetings of clubs or organizations: Not on file    Relationship status: Not on file  . Intimate partner violence    Fear of current or ex partner: Not on file    Emotionally abused: Not on file    Physically abused: Not on file    Forced sexual activity: Not on file  Other Topics Concern  . Not on file  Social History Narrative  . Not on file     Allergies  Allergen Reactions  . Ciprofloxacin Hcl Diarrhea and Other (See Comments)    Developed C-Diff     Outpatient Medications Prior to Visit  Medication Sig Dispense Refill  . acetaminophen (TYLENOL) 500 MG tablet Take 1,000 mg by mouth every 6 (six) hours as needed (for pain).     Marland Kitchen  albuterol (ACCUNEB) 0.63 MG/3ML nebulizer solution Take 1 ampule by nebulization every 6 (six) hours as needed for wheezing.    Marland Kitchen. albuterol (VENTOLIN HFA) 108 (90 Base) MCG/ACT inhaler Inhale 2 puffs into the lungs every 6 (six) hours as needed for wheezing or shortness of breath.    . ALPRAZolam (XANAX) 0.5 MG tablet Take 1 tablet (0.5 mg total) by mouth 3 (three) times daily as needed for anxiety. 90 tablet 1  . CRANBERRY PO Take 1 tablet by mouth 2 (two) times daily.     Marland Kitchen. dexamethasone (DECADRON) 2 MG tablet Take 1 tablet (2 mg total) by mouth 2 (two) times daily. 10 tablet 0  . Diclofenac Sodium CR 100 MG 24 hr tablet Take 1 tablet (100 mg total) by mouth daily. 7 tablet 0  . diphenhydrAMINE (BENADRYL) 25 MG tablet Take 50 mg by mouth as needed for itching or allergies.     Tery Sanfilippo. Docusate Sodium (STOOL SOFTENER) 100 MG capsule Take 100 mg by mouth  2 (two) times daily.    . fexofenadine (ALLEGRA) 180 MG tablet Take 180 mg by mouth daily.    Marland Kitchen. HYDROcodone-acetaminophen (NORCO/VICODIN) 5-325 MG tablet Take 1 tablet by mouth every 6 (six) hours as needed for severe pain. 10 tablet 0  . ibuprofen (ADVIL,MOTRIN) 200 MG tablet Take 800 mg by mouth every 6 (six) hours as needed (for pain).     Marland Kitchen. levothyroxine (SYNTHROID, LEVOTHROID) 75 MCG tablet Take 75 mcg by mouth daily before breakfast.    . mometasone (ASMANEX) 220 MCG/INH inhaler Inhale 2 puffs into the lungs 2 (two) times daily.     . mometasone (NASONEX) 50 MCG/ACT nasal spray Place 2 sprays into the nose daily.    . montelukast (SINGULAIR) 10 MG tablet Take 10 mg by mouth every evening.     . norethindrone-ethinyl estradiol (MICROGESTIN,JUNEL,LOESTRIN) 1-20 MG-MCG tablet Take 1 tablet by mouth daily.    Marland Kitchen. omeprazole (PRILOSEC OTC) 20 MG tablet Take 20 mg by mouth 2 (two) times daily.     . ondansetron (ZOFRAN ODT) 4 MG disintegrating tablet Take 1 tablet (4 mg total) by mouth every 8 (eight) hours as needed for nausea or vomiting. 5 tablet 0  . PARoxetine (PAXIL) 30 MG tablet Take 2 tablets (60 mg total) by mouth daily. 60 tablet 1  . traZODone (DESYREL) 50 MG tablet TAKE 1 TO 2 TABLETS(50 TO 100 MG) BY MOUTH AT BEDTIME 60 tablet 3  . lidocaine (LIDODERM) 5 % Place 1 patch onto the skin daily. Remove & Discard patch within 12 hours or as directed by MD (Patient not taking: Reported on 12/25/2018) 30 patch 0   No facility-administered medications prior to visit.         Objective:   Physical Exam  Vitals:   12/25/18 1521  BP: 130/80  Pulse: (!) 115  Temp: 97.6 F (36.4 C)  TempSrc: Oral  SpO2: 97%  Weight: 285 lb (129.3 kg)  Height: 5\' 4"  (1.626 m)   Gen: Pleasant, obese woman, in no distress,  normal affect  ENT: No lesions,  mouth clear,  oropharynx clear, no postnasal drip  Neck: No JVD, no stridor  Lungs: No use of accessory muscles, no crackles or wheezing on normal  respiration, no wheeze on forced expiration  Cardiovascular: RRR, heart sounds normal, no murmur or gallops, no peripheral edema  Musculoskeletal: No deformities, no cyanosis or clubbing  Neuro: alert, awake, non focal  Skin: Warm, no lesions or rash  Assessment & Plan:  Asthma We will arrange for full pulmonary function testing Please stop Asmanex for now. We will start Symbicort 2 puffs twice daily.  Remember to rinse and gargle after using. Keep your albuterol available use 2 puffs if needed for shortness of breath, chest tightness, wheezing. Continue Singulair, Allegra, Nasonex as you have been taking them. Continue your omeprazole 20 mg twice a day.  Agree with following up with Dr. Randa EvensEdwards regarding breakthrough reflux.  This may be affecting her asthma. Depending on your response to these changes we may decide to refer you for an allergy evaluation. Follow with Dr Delton CoombesByrum in 1 month with full pulmonary function testing  Snoring High suspicion OSA based on body habitus, witnessed apneas.   We will order a split-night sleep study to screen for obstructive sleep apnea.  Levy Pupaobert Frimet Durfee, MD, PhD 12/25/2018, 3:57 PM [facility] [phone] or if no answer [phone]

## 2018-12-25 NOTE — Patient Instructions (Signed)
We will arrange for full pulmonary function testing Please stop Asmanex for now. We will start Symbicort 2 puffs twice daily.  Remember to rinse and gargle after using. Keep your albuterol available use 2 puffs if needed for shortness of breath, chest tightness, wheezing. Continue Singulair, Allegra, Nasonex as you have been taking them. Continue your omeprazole 20 mg twice a day.  Agree with following up with Dr. Oletta Lamas regarding breakthrough reflux.  This may be affecting her asthma. Depending on your response to these changes we may decide to refer you for an allergy evaluation. We will order a split-night sleep study to screen for obstructive sleep apnea. Follow with Dr Lamonte Sakai in 1 month with full pulmonary function testing

## 2018-12-25 NOTE — Addendum Note (Signed)
Addended by: Hildred Alamin I on: 12/25/2018 04:04 PM   Modules accepted: Orders

## 2018-12-25 NOTE — Assessment & Plan Note (Signed)
High suspicion OSA based on body habitus, witnessed apneas.   We will order a split-night sleep study to screen for obstructive sleep apnea.

## 2018-12-25 NOTE — Assessment & Plan Note (Signed)
We will arrange for full pulmonary function testing Please stop Asmanex for now. We will start Symbicort 2 puffs twice daily.  Remember to rinse and gargle after using. Keep your albuterol available use 2 puffs if needed for shortness of breath, chest tightness, wheezing. Continue Singulair, Allegra, Nasonex as you have been taking them. Continue your omeprazole 20 mg twice a day.  Agree with following up with Dr. Oletta Lamas regarding breakthrough reflux.  This may be affecting her asthma. Depending on your response to these changes we may decide to refer you for an allergy evaluation. Follow with Dr Lamonte Sakai in 1 month with full pulmonary function testing

## 2018-12-26 ENCOUNTER — Inpatient Hospital Stay (HOSPITAL_COMMUNITY)
Admission: EM | Admit: 2018-12-26 | Discharge: 2018-12-30 | DRG: 454 | Disposition: A | Discharge status: Home / Self Care | Payer: 59 | Attending: Internal Medicine | Admitting: Internal Medicine

## 2018-12-26 ENCOUNTER — Emergency Department (HOSPITAL_COMMUNITY): Payer: 59

## 2018-12-26 ENCOUNTER — Encounter (HOSPITAL_COMMUNITY): Payer: Self-pay | Admitting: *Deleted

## 2018-12-26 ENCOUNTER — Other Ambulatory Visit: Payer: Self-pay

## 2018-12-26 DIAGNOSIS — R55 Syncope and collapse: Secondary | ICD-10-CM | POA: Diagnosis not present

## 2018-12-26 DIAGNOSIS — Z7951 Long term (current) use of inhaled steroids: Secondary | ICD-10-CM | POA: Diagnosis not present

## 2018-12-26 DIAGNOSIS — Z1159 Encounter for screening for other viral diseases: Secondary | ICD-10-CM

## 2018-12-26 DIAGNOSIS — G8220 Paraplegia, unspecified: Secondary | ICD-10-CM | POA: Diagnosis present

## 2018-12-26 DIAGNOSIS — E669 Obesity, unspecified: Secondary | ICD-10-CM | POA: Diagnosis present

## 2018-12-26 DIAGNOSIS — Z79890 Hormone replacement therapy (postmenopausal): Secondary | ICD-10-CM

## 2018-12-26 DIAGNOSIS — M4807 Spinal stenosis, lumbosacral region: Secondary | ICD-10-CM | POA: Diagnosis present

## 2018-12-26 DIAGNOSIS — M4317 Spondylolisthesis, lumbosacral region: Secondary | ICD-10-CM | POA: Diagnosis present

## 2018-12-26 DIAGNOSIS — J45909 Unspecified asthma, uncomplicated: Secondary | ICD-10-CM | POA: Diagnosis present

## 2018-12-26 DIAGNOSIS — M5441 Lumbago with sciatica, right side: Secondary | ICD-10-CM | POA: Diagnosis present

## 2018-12-26 DIAGNOSIS — I10 Essential (primary) hypertension: Secondary | ICD-10-CM | POA: Diagnosis present

## 2018-12-26 DIAGNOSIS — K219 Gastro-esophageal reflux disease without esophagitis: Secondary | ICD-10-CM | POA: Diagnosis present

## 2018-12-26 DIAGNOSIS — M545 Low back pain, unspecified: Secondary | ICD-10-CM | POA: Diagnosis present

## 2018-12-26 DIAGNOSIS — Z6841 Body Mass Index (BMI) 40.0 and over, adult: Secondary | ICD-10-CM | POA: Diagnosis not present

## 2018-12-26 DIAGNOSIS — E039 Hypothyroidism, unspecified: Secondary | ICD-10-CM | POA: Diagnosis present

## 2018-12-26 DIAGNOSIS — M5442 Lumbago with sciatica, left side: Secondary | ICD-10-CM | POA: Diagnosis present

## 2018-12-26 DIAGNOSIS — Z419 Encounter for procedure for purposes other than remedying health state, unspecified: Secondary | ICD-10-CM

## 2018-12-26 DIAGNOSIS — R0602 Shortness of breath: Secondary | ICD-10-CM

## 2018-12-26 DIAGNOSIS — M48062 Spinal stenosis, lumbar region with neurogenic claudication: Secondary | ICD-10-CM | POA: Diagnosis not present

## 2018-12-26 DIAGNOSIS — M48061 Spinal stenosis, lumbar region without neurogenic claudication: Secondary | ICD-10-CM | POA: Diagnosis present

## 2018-12-26 DIAGNOSIS — F329 Major depressive disorder, single episode, unspecified: Secondary | ICD-10-CM | POA: Diagnosis present

## 2018-12-26 DIAGNOSIS — Z79899 Other long term (current) drug therapy: Secondary | ICD-10-CM

## 2018-12-26 DIAGNOSIS — M5417 Radiculopathy, lumbosacral region: Secondary | ICD-10-CM | POA: Diagnosis present

## 2018-12-26 HISTORY — DX: Essential (primary) hypertension: I10

## 2018-12-26 LAB — COMPREHENSIVE METABOLIC PANEL
ALT: 26 U/L (ref 0–44)
AST: 19 U/L (ref 15–41)
Albumin: 3.2 g/dL — ABNORMAL LOW (ref 3.5–5.0)
Alkaline Phosphatase: 55 U/L (ref 38–126)
Anion gap: 9 (ref 5–15)
BUN: 14 mg/dL (ref 6–20)
CO2: 25 mmol/L (ref 22–32)
Calcium: 8.6 mg/dL — ABNORMAL LOW (ref 8.9–10.3)
Chloride: 103 mmol/L (ref 98–111)
Creatinine, Ser: 1.05 mg/dL — ABNORMAL HIGH (ref 0.44–1.00)
GFR calc Af Amer: >60 mL/min (ref >60)
GFR calc non Af Amer: >60 mL/min (ref >60)
Glucose, Bld: 77 mg/dL (ref 70–99)
Potassium: 3.6 mmol/L (ref 3.5–5.1)
Sodium: 137 mmol/L (ref 135–145)
Total Bilirubin: 0.4 mg/dL (ref 0.3–1.2)
Total Protein: 6.5 g/dL (ref 6.5–8.1)

## 2018-12-26 LAB — CBC
HCT: 38.9 % (ref 36.0–46.0)
Hemoglobin: 12.7 g/dL (ref 12.0–15.0)
MCH: 29.1 pg (ref 26.0–34.0)
MCHC: 32.6 g/dL (ref 30.0–36.0)
MCV: 89.2 fL (ref 80.0–100.0)
Platelets: 410 10*3/uL — ABNORMAL HIGH (ref 150–400)
RBC: 4.36 MIL/uL (ref 3.87–5.11)
RDW: 13.2 % (ref 11.5–15.5)
WBC: 12.2 10*3/uL — ABNORMAL HIGH (ref 4.0–10.5)
nRBC: 0 % (ref 0.0–0.2)

## 2018-12-26 LAB — SARS CORONAVIRUS 2 BY RT PCR (HOSPITAL ORDER, PERFORMED IN ~~LOC~~ HOSPITAL LAB): SARS Coronavirus 2: NEGATIVE

## 2018-12-26 MED ORDER — OMEPRAZOLE 20 MG PO CPDR
20.0000 mg | DELAYED_RELEASE_CAPSULE | Freq: Two times a day (BID) | ORAL | Status: DC
  Administered 2018-12-27 – 2018-12-30 (×4): 20 mg via ORAL
  Filled 2018-12-26 (×9): qty 1

## 2018-12-26 MED ORDER — HYDROMORPHONE HCL 1 MG/ML IJ SOLN
1.0000 mg | INTRAMUSCULAR | Status: DC | PRN
  Administered 2018-12-27 – 2018-12-30 (×12): 1 mg via INTRAVENOUS
  Filled 2018-12-26 (×15): qty 1

## 2018-12-26 MED ORDER — ALBUTEROL SULFATE (2.5 MG/3ML) 0.083% IN NEBU: 3.0000 mL | INHALATION_SOLUTION | Freq: Four times a day (QID) | RESPIRATORY_TRACT | Status: DC | PRN

## 2018-12-26 MED ORDER — HYDROCODONE-ACETAMINOPHEN 5-325 MG PO TABS
1.0000 | ORAL_TABLET | Freq: Four times a day (QID) | ORAL | Status: DC | PRN
  Administered 2018-12-26 – 2018-12-29 (×8): 1 via ORAL
  Filled 2018-12-26 (×8): qty 1

## 2018-12-26 MED ORDER — PANTOPRAZOLE SODIUM 40 MG PO TBEC: 40.0000 mg | DELAYED_RELEASE_TABLET | Freq: Every day | ORAL | Status: DC

## 2018-12-26 MED ORDER — PAROXETINE HCL 20 MG PO TABS
60.0000 mg | ORAL_TABLET | Freq: Every day | ORAL | Status: DC
  Administered 2018-12-26 – 2018-12-30 (×4): 60 mg via ORAL
  Filled 2018-12-26 (×5): qty 3

## 2018-12-26 MED ORDER — LORATADINE 10 MG PO TABS
10.0000 mg | ORAL_TABLET | Freq: Every day | ORAL | Status: DC
  Administered 2018-12-27 – 2018-12-30 (×3): 10 mg via ORAL
  Filled 2018-12-26 (×3): qty 1

## 2018-12-26 MED ORDER — BUDESONIDE 0.5 MG/2ML IN SUSP
1.0000 mg | Freq: Two times a day (BID) | RESPIRATORY_TRACT | Status: DC
  Administered 2018-12-27 – 2018-12-30 (×7): 1 mg via RESPIRATORY_TRACT
  Filled 2018-12-26 (×7): qty 4

## 2018-12-26 MED ORDER — ENOXAPARIN SODIUM 40 MG/0.4ML ~~LOC~~ SOLN
40.0000 mg | SUBCUTANEOUS | Status: DC
  Administered 2018-12-26 – 2018-12-29 (×4): 40 mg via SUBCUTANEOUS
  Filled 2018-12-26 (×4): qty 0.4

## 2018-12-26 MED ORDER — MONTELUKAST SODIUM 10 MG PO TABS
10.0000 mg | ORAL_TABLET | Freq: Every evening | ORAL | Status: DC
  Administered 2018-12-26 – 2018-12-29 (×3): 10 mg via ORAL
  Filled 2018-12-26 (×4): qty 1

## 2018-12-26 MED ORDER — OMEPRAZOLE MAGNESIUM 20 MG PO TBEC: 20.0000 mg | DELAYED_RELEASE_TABLET | Freq: Two times a day (BID) | ORAL | Status: DC

## 2018-12-26 MED ORDER — ACETAMINOPHEN 650 MG RE SUPP: 650.0000 mg | Freq: Four times a day (QID) | RECTAL | Status: DC | PRN

## 2018-12-26 MED ORDER — MOMETASONE FUROATE 220 MCG/INH IN AEPB: 2.0000 | INHALATION_SPRAY | Freq: Two times a day (BID) | RESPIRATORY_TRACT | Status: DC

## 2018-12-26 MED ORDER — DEXAMETHASONE 4 MG PO TABS
4.0000 mg | ORAL_TABLET | Freq: Three times a day (TID) | ORAL | Status: DC
  Administered 2018-12-26 – 2018-12-29 (×6): 4 mg via ORAL
  Filled 2018-12-26 (×7): qty 1

## 2018-12-26 MED ORDER — ALPRAZOLAM 0.5 MG PO TABS
0.5000 mg | ORAL_TABLET | Freq: Three times a day (TID) | ORAL | Status: DC | PRN
  Administered 2018-12-27 – 2018-12-29 (×4): 0.5 mg via ORAL
  Filled 2018-12-26 (×4): qty 1

## 2018-12-26 MED ORDER — ALBUTEROL SULFATE HFA 108 (90 BASE) MCG/ACT IN AERS: 2.0000 | INHALATION_SPRAY | Freq: Four times a day (QID) | RESPIRATORY_TRACT | Status: DC | PRN

## 2018-12-26 MED ORDER — LEVOTHYROXINE SODIUM 75 MCG PO TABS
75.0000 ug | ORAL_TABLET | Freq: Every day | ORAL | Status: DC
  Administered 2018-12-27 – 2018-12-30 (×3): 75 ug via ORAL
  Filled 2018-12-26 (×4): qty 1

## 2018-12-26 MED ORDER — ONDANSETRON HCL 4 MG PO TABS: 4.0000 mg | ORAL_TABLET | Freq: Four times a day (QID) | ORAL | Status: DC | PRN

## 2018-12-26 MED ORDER — ACETAMINOPHEN 325 MG PO TABS: 650.0000 mg | ORAL_TABLET | Freq: Four times a day (QID) | ORAL | Status: DC | PRN

## 2018-12-26 MED ORDER — GADOBUTROL 1 MMOL/ML IV SOLN
10.0000 mL | Freq: Once | INTRAVENOUS | Status: AC | PRN
  Administered 2018-12-26: 10 mL via INTRAVENOUS

## 2018-12-26 MED ORDER — SODIUM CHLORIDE 0.9 % IV BOLUS
1000.0000 mL | Freq: Once | INTRAVENOUS | Status: AC
  Administered 2018-12-26: 1000 mL via INTRAVENOUS

## 2018-12-26 MED ORDER — FLUTICASONE PROPIONATE 50 MCG/ACT NA SUSP
1.0000 | Freq: Every day | NASAL | Status: DC
  Administered 2018-12-28 – 2018-12-30 (×3): 1 via NASAL
  Filled 2018-12-26 (×2): qty 16

## 2018-12-26 MED ORDER — MOMETASONE FURO-FORMOTEROL FUM 200-5 MCG/ACT IN AERO
2.0000 | INHALATION_SPRAY | Freq: Two times a day (BID) | RESPIRATORY_TRACT | Status: DC
  Administered 2018-12-27 – 2018-12-30 (×7): 2 via RESPIRATORY_TRACT
  Filled 2018-12-26: qty 8.8

## 2018-12-26 MED ORDER — ONDANSETRON HCL 4 MG/2ML IJ SOLN: 4.0000 mg | Freq: Four times a day (QID) | INTRAMUSCULAR | Status: DC | PRN

## 2018-12-26 MED ORDER — TRAZODONE HCL 50 MG PO TABS
50.0000 mg | ORAL_TABLET | Freq: Every day | ORAL | Status: DC
  Administered 2018-12-26 – 2018-12-29 (×4): 50 mg via ORAL
  Filled 2018-12-26 (×4): qty 1

## 2018-12-26 MED ORDER — HYDROMORPHONE HCL 1 MG/ML IJ SOLN
1.0000 mg | Freq: Once | INTRAMUSCULAR | Status: AC
  Administered 2018-12-26: 1 mg via INTRAVENOUS
  Filled 2018-12-26: qty 1

## 2018-12-26 NOTE — ED Provider Notes (Signed)
[facility] Emergency Department Provider Note MRN:  [id number]  Arrival date & time: 12/26/18     Chief Complaint   Near Syncope   History of Present Illness   Carolyn Castro is a 38 y.o. year-old female with a history of hypothyroidism presenting to the ED with chief complaint of near syncope.  Patient has been struggling with a new back pain for the past few months.  She explains that her low back pain has been worse for the past few days, was struggling to walk due to pain last night.  When she woke up this morning at 5 AM, she felt that her legs were mildly numb and weak.  She went to work and experienced slow progression of this numbness and weakness.  At work she experienced lightheadedness and a sudden onset sharp frontal headache, causing her to lower herself to the ground.  Did not lose consciousness.  Over the past hour and a half, she has lost function of her legs and cannot feel them.  She denies chest pain or shortness of breath, no abdominal pain.  For the past few days she is also been experiencing incontinence of urine and stool.  Review of Systems  A complete 10 system review of systems was obtained and all systems are negative except as noted in the HPI and PMH.   Patient's Health History    Past Medical History:  Diagnosis Date   Anxiety    Asthma    Back pain    Hypertension    Hypothyroid     Past Surgical History:  Procedure Laterality Date   COLONOSCOPY     LAPAROSCOPIC OVARIAN CYSTECTOMY Left 06/29/2018   Procedure: LAPAROSCOPIC OVARIAN CYSTECTOMY;  Surgeon: Gerald Leitzole, Tara, MD;  Location: [facility];  Service: Gynecology;  Laterality: Left;   TONSILLECTOMY     WISDOM TOOTH EXTRACTION      Family History  Problem Relation Age of Onset   Hypertension Mother    Hypertension Father     Social History   Socioeconomic History   Marital status: Single    Spouse name: Not on file   Number of children: Not on  file   Years of education: Not on file   Highest education level: Not on file  Occupational History   Not on file  Social Needs   Financial resource strain: Not on file   Food insecurity    Worry: Not on file    Inability: Not on file   Transportation needs    Medical: Not on file    Non-medical: Not on file  Tobacco Use   Smoking status: Never Smoker   Smokeless tobacco: Never Used  Substance and Sexual Activity   Alcohol use: Never    Frequency: Never   Drug use: Never   Sexual activity: Not Currently  Lifestyle   Physical activity    Days per week: Not on file    Minutes per session: Not on file   Stress: Not on file  Relationships   Social connections    Talks on phone: Not on file    Gets together: Not on file    Attends religious service: Not on file    Active member of club or organization: Not on file    Attends meetings of clubs or organizations: Not on file    Relationship status: Not on file   Intimate partner violence    Fear of current or ex partner: Not on file  Emotionally abused: Not on file    Physically abused: Not on file    Forced sexual activity: Not on file  Other Topics Concern   Not on file  Social History Narrative   Not on file     Physical Exam  Vital Signs and Nursing Notes reviewed Vitals:   12/26/18 1059 12/26/18 1346  BP: (!) 151/97 128/79  Pulse: (!) 111 (!) 111  Resp: (!) 22 (!) 23  Temp: 98 F (36.7 C)   SpO2: 100% 96%    CONSTITUTIONAL: Well-appearing, NAD NEURO:  Alert and oriented x 3, sensory loss inferior to the T10 sensory dermatome, paralysis of bilateral lower extremities, hyperreflexia upon patellar tendon testing; normal strength and sensation to bilateral upper extremities, normal extraocular movements, no slurred speech EYES:  eyes equal and reactive ENT/NECK:  no LAD, no JVD CARDIO: Tachycardic rate, well-perfused, normal S1 and S2 PULM:  CTAB no wheezing or rhonchi GI/GU:  normal bowel  sounds, non-distended, non-tender MSK/SPINE:  No gross deformities, no edema SKIN:  no rash, atraumatic PSYCH:  Appropriate speech and behavior  Diagnostic and Interventional Summary    EKG Interpretation  Date/Time:  Wednesday December 26 2018 11:00:30 EDT Ventricular Rate:  109 PR Interval:    QRS Duration: 91 QT Interval:  346 QTC Calculation: 466 R Axis:   -9 Text Interpretation:  Sinus tachycardia Low voltage, precordial leads Confirmed by Gerlene Fee [phone]) on 12/26/2018 11:27:46 AM      Labs Reviewed  CBC - Abnormal; Notable for the following components:      Result Value   WBC 12.2 (*)    Platelets 410 (*)    All other components within normal limits  COMPREHENSIVE METABOLIC PANEL - Abnormal; Notable for the following components:   Creatinine, Ser 1.05 (*)    Calcium 8.6 (*)    Albumin 3.2 (*)    All other components within normal limits  SARS CORONAVIRUS 2 (HOSPITAL ORDER, [place] [facility])    MR LUMBAR SPINE WO CONTRAST  Final Result    MR THORACIC SPINE W WO CONTRAST  Final Result    CT Head Wo Contrast  Final Result    DG Chest Port 1 View  Final Result      Medications  sodium chloride 0.9 % bolus 1,000 mL (1,000 mLs Intravenous New Bag/Given 12/26/18 1307)  HYDROmorphone (DILAUDID) injection 1 mg (1 mg Intravenous Given 12/26/18 1304)  gadobutrol (GADAVIST) 1 MMOL/ML injection 10 mL (10 mLs Intravenous Contrast Given 12/26/18 1600)     Procedures Critical Care Critical Care Documentation Critical care time provided by me (excluding procedures): 32 minutes  Condition necessitating critical care: Concern for myelopathy  Components of critical care management: reviewing of prior records, laboratory and imaging interpretation, frequent re-examination and reassessment of vital signs, discussion with consulting services.    ED Course and Medical Decision Making  I have reviewed the triage vital signs and the nursing  notes.  Pertinent labs & imaging results that were available during my care of the patient were reviewed by me and considered in my medical decision making (see below for details).  Concern for spinal cord injury or lesion in this 38 year old female with worsening back pain and new onset paraplegia today.  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  Patient also complaining of acute headache with presyncope, raising some concern for subarachnoid hemorrhage but felt to be less likely given the neurological exam.  Given the progression of a significant neurological deficit within the past 2 hours, this case was discussed with neurology on-call, Dr. Laurence SlateAroor.  Dr. April Holdingoeder agrees that this is much more likely to be localized to the spinal cord, no need for code stroke alert, agrees with plan for CT head without contrast, MRI of the thoracic and lumbar spine.  We will also stat page neurosurgery on call to discuss the case.  Vascular phenomenon also considered, but patient has strong DP pulses and normal cap refill to bilateral lower extremities.  5:12 PM update: MRI reveals no thoracic spinal lesions, does reveal a new acute worsening of lumbar stenosis that according to neurosurgery will require surgical intervention.  Patient will be admitted to hospitalist service for pain control until surgical intervention can be performed.  Elmer SowMichael M. Pilar PlateBero, MD [facility] [email or web address]  Final Clinical Impressions(s) / ED Diagnoses     ICD-10-CM   1. Spinal stenosis of lumbar region, unspecified whether neurogenic claudication present  M48.061   2. SOB (shortness of breath)  R06.02 [facility]    DG Chest [place]  3. Acute midline low back pain, unspecified whether sciatica present  M54.5   4. Near syncope  R55     ED Discharge Orders    None         Sabas SousBero, Omesha Bowerman M, MD 12/26/18 [phone]

## 2018-12-26 NOTE — Progress Notes (Signed)
Obtained report from [facility] ED RN, patient will be admitted to 3w 37

## 2018-12-26 NOTE — Consult Note (Addendum)
Neurology Consultation  Reason for Consult: Bilateral leg weakness and numbness Referring Physician: Dr. Maurie Boettcher  CC: Bilateral leg weakness and numbness  History is obtained from: Patient and chart  HPI: Carolyn Castro is a 38 y.o. female with history of hypothyroid, hypertension, back pain, asthma, anxiety.  Patient states that for the last 2 days she has been having back discomfort.  She states that she does have chronic back ache.  Yesterday she awoke and she felt as though when she was walking that her legs felt very heavy and she had decreased sensation.  This morning when she got up she was able to walk but not very long.  She felt as though the more she walked the weaker she got to the point where she called EMS.  She also admits that over the last 2 days she has been having difficulty holding her urination and defecation.  She knows she has to go to the bathroom and can hold it for some time but not able to hold in enough to get to the bathroom at times.  In addition she also does state that she felt dizzy/woozy headed/off balance at times.  Looking at prior MRIs patient does have a grade 2 L5-S1 anterior listhesis with no severe canal stenosis but also she does have severe L5-S1 neuroforaminal narrowing   ED course CT head, labs  ROS: A 14 point ROS was performed and is negative except as noted in the HPI.   Past Medical History:  Diagnosis Date  . Anxiety   . Asthma   . Back pain   . Hypertension   . Hypothyroid    Family History  Problem Relation Age of Onset  . Hypertension Mother   . Hypertension Father     Social History:   reports that she has never smoked. She has never used smokeless tobacco. She reports that she does not drink alcohol or use drugs.  Medications No current facility-administered medications for this encounter.   Current Outpatient Medications:  .  acetaminophen (TYLENOL) 500 MG tablet, Take 1,000 mg by mouth every 6 (six) hours as needed (for  pain). , Disp: , Rfl:  .  albuterol (ACCUNEB) 0.63 MG/3ML nebulizer solution, Take 1 ampule by nebulization every 6 (six) hours as needed for wheezing., Disp: , Rfl:  .  albuterol (VENTOLIN HFA) 108 (90 Base) MCG/ACT inhaler, Inhale 2 puffs into the lungs every 6 (six) hours as needed for wheezing or shortness of breath., Disp: , Rfl:  .  ALPRAZolam (XANAX) 0.5 MG tablet, Take 1 tablet (0.5 mg total) by mouth 3 (three) times daily as needed for anxiety., Disp: 90 tablet, Rfl: 1 .  budesonide-formoterol (SYMBICORT) 160-4.5 MCG/ACT inhaler, Inhale 2 puffs into the lungs 2 (two) times daily., Disp: 1 Inhaler, Rfl: 0 .  budesonide-formoterol (SYMBICORT) 160-4.5 MCG/ACT inhaler, Inhale 2 puffs into the lungs 2 (two) times daily., Disp: 1 Inhaler, Rfl: 0 .  CRANBERRY PO, Take 1 tablet by mouth 2 (two) times daily. , Disp: , Rfl:  .  dexamethasone (DECADRON) 2 MG tablet, Take 1 tablet (2 mg total) by mouth 2 (two) times daily., Disp: 10 tablet, Rfl: 0 .  Diclofenac Sodium CR 100 MG 24 hr tablet, Take 1 tablet (100 mg total) by mouth daily., Disp: 7 tablet, Rfl: 0 .  diphenhydrAMINE (BENADRYL) 25 MG tablet, Take 50 mg by mouth as needed for itching or allergies. , Disp: , Rfl:  .  Docusate Sodium (STOOL SOFTENER) 100 MG capsule, Take  100 mg by mouth 2 (two) times daily., Disp: , Rfl:  .  fexofenadine (ALLEGRA) 180 MG tablet, Take 180 mg by mouth daily., Disp: , Rfl:  .  HYDROcodone-acetaminophen (NORCO/VICODIN) 5-325 MG tablet, Take 1 tablet by mouth every 6 (six) hours as needed for severe pain., Disp: 10 tablet, Rfl: 0 .  ibuprofen (ADVIL,MOTRIN) 200 MG tablet, Take 800 mg by mouth every 6 (six) hours as needed (for pain). , Disp: , Rfl:  .  levothyroxine (SYNTHROID, LEVOTHROID) 75 MCG tablet, Take 75 mcg by mouth daily before breakfast., Disp: , Rfl:  .  lidocaine (LIDODERM) 5 %, Place 1 patch onto the skin daily. Remove & Discard patch within 12 hours or as directed by MD (Patient not taking: Reported on  12/25/2018), Disp: 30 patch, Rfl: 0 .  mometasone (ASMANEX) 220 MCG/INH inhaler, Inhale 2 puffs into the lungs 2 (two) times daily. , Disp: , Rfl:  .  mometasone (NASONEX) 50 MCG/ACT nasal spray, Place 2 sprays into the nose daily., Disp: , Rfl:  .  montelukast (SINGULAIR) 10 MG tablet, Take 10 mg by mouth every evening. , Disp: , Rfl:  .  norethindrone-ethinyl estradiol (MICROGESTIN,JUNEL,LOESTRIN) 1-20 MG-MCG tablet, Take 1 tablet by mouth daily., Disp: , Rfl:  .  omeprazole (PRILOSEC OTC) 20 MG tablet, Take 20 mg by mouth 2 (two) times daily. , Disp: , Rfl:  .  ondansetron (ZOFRAN ODT) 4 MG disintegrating tablet, Take 1 tablet (4 mg total) by mouth every 8 (eight) hours as needed for nausea or vomiting., Disp: 5 tablet, Rfl: 0 .  PARoxetine (PAXIL) 30 MG tablet, Take 2 tablets (60 mg total) by mouth daily., Disp: 60 tablet, Rfl: 1 .  traZODone (DESYREL) 50 MG tablet, TAKE 1 TO 2 TABLETS(50 TO 100 MG) BY MOUTH AT BEDTIME, Disp: 60 tablet, Rfl: 3   Exam: Current vital signs: BP (!) 151/97 (BP Location: Left Arm)   Pulse (!) 111   Temp 98 F (36.7 C) (Oral)   Resp (!) 22   Ht 5\' 4"  (1.626 m)   Wt 127 kg   LMP 12/13/2018   SpO2 100%   BMI 48.06 kg/m  Vital signs in last 24 hours: Temp:  [97.6 F (36.4 C)-98 F (36.7 C)] 98 F (36.7 C) (06/24 1059) Pulse Rate:  [111-115] 111 (06/24 1059) Resp:  [22] 22 (06/24 1059) BP: (130-151)/(80-97) 151/97 (06/24 1059) SpO2:  [97 %-100 %] 100 % (06/24 1059) Weight:  [127 kg-129.3 kg] 127 kg (06/24 1116)  Physical Exam  Constitutional: Appears well-developed and well-nourished.  Psych: Affect appropriate to situation Eyes: No scleral injection HENT: No OP obstrucion Head: Normocephalic.  Cardiovascular: Normal rate and regular rhythm.  Respiratory: Effort normal, non-labored breathing GI: Soft.  No distension. There is no tenderness.  Skin: WDI  Neuro: Mental Status: Patient is awake, alert, oriented to person, place, month, year, and  situation. Patient is able to give a clear and coherent history. No signs of aphasia or neglect Cranial Nerves: II: Visual Fields are full.  III,IV, VI: EOMI without ptosis or diploplia. Pupils equal, round and reactive to light V: Facial sensation is symmetric to temperature VII: Facial movement is symmetric.  VIII: hearing is intact to voice X: Palat elevates symmetrically XI: Shoulder shrug is symmetric. XII: tongue is midline without atrophy or fasciculations.  Motor: Tone is normal. Bulk is normal. 5/5 strength in upper extremities, when giving full effort patient does have 4+/5 strength in hip flexion, knee flexion bilaterally, knee extension bilaterally  and bilateral plantar flexion and extension Sensory: Patient states she has no temperature or light touch sensation up to approximately the T10-T11 region however she is able to feel vibratory sensation at both the knee and ankle.  She also has proprioception bilaterally.  However does have sensation per patient in the perineal region Deep Tendon Reflexes: 3+ and symmetric in knee jerk, 2+ bilateral ankle jerk, 2+ bilateral upper extremity bicep and brachioradialis Plantars: Mute bilaterally Cerebellar: Finger-to-nose within normal limits  Labs I have reviewed labs in epic and the results pertinent to this consultation are:   CBC    Component Value Date/Time   WBC 10.8 (H) 12/24/2018 0041   RBC 4.38 12/24/2018 0041   HGB 12.8 12/24/2018 0041   HCT 39.7 12/24/2018 0041   PLT 399 12/24/2018 0041   MCV 90.6 12/24/2018 0041   MCH 29.2 12/24/2018 0041   MCHC 32.2 12/24/2018 0041   RDW 13.2 12/24/2018 0041   LYMPHSABS 1.0 09/04/2018 1532   MONOABS 0.9 09/04/2018 1532   EOSABS 0.0 09/04/2018 1532   BASOSABS 0.0 09/04/2018 1532    CMP     Component Value Date/Time   NA 137 12/24/2018 0041   K 4.0 12/24/2018 0041   CL 106 12/24/2018 0041   CO2 17 (L) 12/24/2018 0041   GLUCOSE 172 (H) 12/24/2018 0041   BUN 17  12/24/2018 0041   CREATININE 1.05 (H) 12/24/2018 0041   CALCIUM 9.2 12/24/2018 0041   PROT 6.6 12/24/2018 0041   ALBUMIN 3.4 (L) 12/24/2018 0041   AST 20 12/24/2018 0041   ALT 24 12/24/2018 0041   ALKPHOS 60 12/24/2018 0041   BILITOT 0.3 12/24/2018 0041   GFRNONAA >60 12/24/2018 0041   GFRAA >60 12/24/2018 0041    Lipid Panel  No results found for: CHOL, TRIG, HDL, CHOLHDL, VLDL, LDLCALC, LDLDIRECT   Imaging I have reviewed the images obtained:  CT-scan of the brain-normal head CT  MRI of thoracic/lumbar spine without contrast pending  Felicie Mornavid Smith PA-C [facility] [phone]  M-F  (9:00 am- 5:00 PM)  12/26/2018, 1:13 PM     Assessment:  38 y/o female with h/o of anterolisthesis and L5 pars reticularis defects, L5-S1 foraminal narrowing presents with bilateral lower extremity weakness, urinary and defecation urge incontinence along with decreased sensation with preserved proprioception and vibratory sensation ( impaired on initial testing by Neurology MD) .  Patient appears to have decreased temperature and light touch sensation up to the level of T11-12 however intact sensation in the perineal region.  There does appear to be reduced effort on examination. D/D include cord compression, transverse myelitis or lesion in  thoracic spine. There is a concern for embellishment /exageration of symptoms based on some inconsistencies in her exam, however this would be a diagnosis of exclusion. Mri T and L spine pending, also recommend pain management - to see if weakness is due to limitation from pain.   Impression: B/L paraplegia and sensory loss  Recommendations: -MRI of both lumbar and thoracic spine -- pain management -We will make further recommendations after images have been obtained    NEUROHOSPITALIST ADDENDUM Performed a face to face diagnostic evaluation.   I have reviewed the contents of history and physical exam as documented by PA/ARNP/Resident and  agree with above documentation.  I have discussed and formulated the above plan as documented. Edits to the note have been made as needed.  38 year old female with past medical history of anterolisthesis and L5 pars reticularis defects, L5-S1 foraminal narrowing,  chronic back pain, history of anxiety presents to the emergency department after almost passing out, headache and inability to move both legs around 1:30 PM today.  Initially called by Dr. Pilar PlateBero, case was discussed and decision was made not to stroke alert patient is low suspicion for intracranial process.  I assessed the patient shortly after.  She states that she has been having bilateral lower extremity weakness since Sunday and intermittent numbness.  However today at 1:30 PM it became worse and she was not able to move her legs.  She has also states she has been having urinary urgency and bowel movement urgency  On examination she was alert and oriented x4, no cranial nerve deficits.  Upper extremities have normal strength, normal sensation.  Normal reflexes. On lower extremities, she has normal tone but does not lift legs against gravity and has flicking movement of her toes.  On sensory examination, below level of her umbilicus she does not feel light touch, pinprick.  On my assessment, vibration and position sense also diminished (this has changed approximately 1 hour when PA examined the patient as well as increased strength in lower extremities).  Reflexes 2+.  Cross adductors negative.  Plantars downgoing.  Coordination in upper extremities normal   Mri T and L spine obtained, T spine negative for acute abnormality.  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  Assessment and plan As stated above, I do think there is a combination of actual pathology with spinal stenosis with some embellishment/exaggeration of her symptoms.  Follow-up neurosurgery recommendations Continue to treat pain PT OT  evaluation   Georgiana SpinnerSushanth Aroor MD [facility] [phone]   If 7pm to 7am, please call on call as listed on AMION.

## 2018-12-26 NOTE — H&P (Signed)
TRH H&P   Patient Demographics:    Carolyn Castro, is a 38 y.o. female  MRN: [id number]   DOB - Feb 28, 1981  Admit Date - 12/26/2018  Outpatient Primary MD for the patient is Shirlean Mylar, MD  Referring MD: Dr. Pilar Plate  Outpatient Specialists: None  Patient coming from: Home  Chief Complaint  Patient presents with   Near Syncope      HPI:    Carolyn Castro  is a 38 y.o. female, with history of asthma, allergies, chronic depression, morbid obesity, hypothyroidism who was diagnosed of having intermittent mild chronic low back pain.  She was hospitalized in March after she fell with significant low back pain radiating down to her legs she was found to have severe L5-S1 neural foraminal narrowing on imaging without any neurological deficit and improved on pain control.  She was discharged with outpatient follow-up with Dr. Ollen Bowl (pain clinic)..  Patient reports that she was getting intermittent shots for her pain that would relieve her symptoms.  However for past 1-2 weeks pain has worsened without any fall or injury sustained.  Dr. Vonda Antigua prescribed her gabapentin without any improvement.  She presented to the ED with severe low back pain and being unable to move her bilateral lower extremities.  For past 3-4 days she also has been having intermittent bowel and urinary incontinence (see episodes of bowel incontinence and one episode of urinary incontinence).  The pain is in her midline lower back 10/10 in severity with tingling and numbness all the way down from the low back, her hip to the legs. For past few days she was also having episodes of lightheadedness and unsteadiness. Patient denies any fevers, chills, recent illness, recent travel, sick contact, headache, nausea, vomiting, chest pain, palpitations, blurred vision, abdominal pain, dysuria, pain in her joints.  In the ED  vitals were stable except for mildly elevated blood pressure.  Blood work showed normal CBC and chemistry.  Patient was tested negative for SARS Coronavirus 2. MRI of the thoracic and lumbar spine with and without contrast showed significant increase in lumbar epidural lipomatosis since her last study in March at the level of L4-L5 and L5 and S1 causing new spinal stenosis.  No thoracic cord spinal abnormality was noted. Patient given IV Dilaudid, neurology and neurosurgery consulted. Patient seen by neurosurgery and recommended admission to hospitalist service with possible neurosurgical intervention on 6/26.     Review of systems:    In addition to the HPI above, No Fever-chills, No Headache, dizziness +, unsteady gait +, no change in vision No problems swallowing food or Liquids, No Chest pain, Cough or Shortness of Breath, No Abdominal pain, No Nausea or vomiting, Bowel movements are regular, No Blood in stool or Urine, No dysuria, No new skin rashes or bruises, No new joints pains-aches,  Low back pain + + + Bilateral lower extremity weakness,  tingling, numbness in any extremity, No recent weight gain or loss, No polyuria, polydypsia or polyphagia, No significant Mental Stressors.    With Past History of the following :    Past Medical History:  Diagnosis Date   Anxiety    Asthma    Back pain    Hypertension    Hypothyroid       Past Surgical History:  Procedure Laterality Date   COLONOSCOPY     LAPAROSCOPIC OVARIAN CYSTECTOMY Left 06/29/2018   Procedure: LAPAROSCOPIC OVARIAN CYSTECTOMY;  Surgeon: Gerald Leitz, MD;  Location: [facility];  Service: Gynecology;  Laterality: Left;   TONSILLECTOMY     WISDOM TOOTH EXTRACTION        Social History:     Social History   Tobacco Use   Smoking status: Never Smoker   Smokeless tobacco: Never Used  Substance Use Topics   Alcohol use: Never    Frequency: Never     Lives  -home  Mobility -independent prior to acute illness     Family History :     Family History  Problem Relation Age of Onset   Hypertension Mother    Hypertension Father       Home Medications:   Prior to Admission medications   Medication Sig Start Date End Date Taking? Authorizing Provider  acetaminophen (TYLENOL) 500 MG tablet Take 1,000 mg by mouth every 6 (six) hours as needed (for pain).     [provider]  albuterol (ACCUNEB) 0.63 MG/3ML nebulizer solution Take 1 ampule by nebulization every 6 (six) hours as needed for wheezing.    [provider]  albuterol (VENTOLIN HFA) 108 (90 Base) MCG/ACT inhaler Inhale 2 puffs into the lungs every 6 (six) hours as needed for wheezing or shortness of breath.    [provider]  ALPRAZolam Prudy Feeler) 0.5 MG tablet Take 1 tablet (0.5 mg total) by mouth 3 (three) times daily as needed for anxiety. 09/27/18   Cottle, Steva Ready., MD  budesonide-formoterol [facility]) 160-4.5 MCG/ACT inhaler Inhale 2 puffs into the lungs 2 (two) times daily. 12/25/18   Leslye Peer, MD  budesonide-formoterol (SYMBICORT) 160-4.5 MCG/ACT inhaler Inhale 2 puffs into the lungs 2 (two) times daily. 12/25/18   Leslye Peer, MD  CRANBERRY PO Take 1 tablet by mouth 2 (two) times daily.     [provider]  dexamethasone (DECADRON) 2 MG tablet Take 1 tablet (2 mg total) by mouth 2 (two) times daily. 12/16/18   Palumbo, April, MD  Diclofenac Sodium CR 100 MG 24 hr tablet Take 1 tablet (100 mg total) by mouth daily. 12/16/18   Palumbo, April, MD  diphenhydrAMINE (BENADRYL) 25 MG tablet Take 50 mg by mouth as needed for itching or allergies.     [provider]  Docusate Sodium (STOOL SOFTENER) 100 MG capsule Take 100 mg by mouth 2 (two) times daily.    [provider]  fexofenadine (ALLEGRA) 180 MG tablet Take 180 mg by mouth daily.    [provider]  HYDROcodone-acetaminophen (NORCO/VICODIN) 5-325 MG tablet  Take 1 tablet by mouth every 6 (six) hours as needed for severe pain. 12/24/18   Zadie Rhine, MD  ibuprofen (ADVIL,MOTRIN) 200 MG tablet Take 800 mg by mouth every 6 (six) hours as needed (for pain).     [provider]  levothyroxine (SYNTHROID, LEVOTHROID) 75 MCG tablet Take 75 mcg by mouth daily before breakfast.    [provider]  lidocaine (LIDODERM) 5 %  Place 1 patch onto the skin daily. Remove & Discard patch within 12 hours or as directed by MD Patient not taking: Reported on 12/25/2018 12/16/18   Palumbo, April, MD  mometasone [facility](ASMANEX) 220 MCG/INH inhaler Inhale 2 puffs into the lungs 2 (two) times daily.     [provider]  mometasone (NASONEX) 50 MCG/ACT nasal spray Place 2 sprays into the nose daily.    [provider]  montelukast (SINGULAIR) 10 MG tablet Take 10 mg by mouth every evening.     [provider]  norethindrone-ethinyl estradiol (MICROGESTIN,JUNEL,LOESTRIN) 1-20 MG-MCG tablet Take 1 tablet by mouth daily.    [provider]  omeprazole (PRILOSEC OTC) 20 MG tablet Take 20 mg by mouth 2 (two) times daily.     [provider]  ondansetron (ZOFRAN ODT) 4 MG disintegrating tablet Take 1 tablet (4 mg total) by mouth every 8 (eight) hours as needed for nausea or vomiting. 08/15/18   Petrucelli, Samantha R, PA-C  PARoxetine (PAXIL) 30 MG tablet Take 2 tablets (60 mg total) by mouth daily. 09/27/18   Lauraine Rinneottle, Carey G Jr., MD  traZODone (DESYREL) 50 MG tablet TAKE 1 TO 2 TABLETS(50 TO 100 MG) BY MOUTH AT BEDTIME 10/15/18   Cottle, Steva Readyarey G Jr., MD     Allergies:     Allergies  Allergen Reactions   Ciprofloxacin Hcl Diarrhea and Other (See Comments)    Developed C-Diff     Physical Exam:   Vitals  Blood pressure 128/79, pulse (!) 111, temperature 98 F (36.7 C), temperature source Oral, resp. rate (!) 23, height 5\' 4"  (1.626 m), weight 127 kg, last menstrual period 12/13/2018, SpO2 96 %.   General:  Middle-aged morbidly obese female lying in bed in some distress with pain HEENT: Pupils reactive bilaterally, EOMI, no pallor, no icterus, moist mucosa, supple neck, no cervical lymphadenopathy Chest: Clear bilaterally CVs: Normal S1-S2, no murmurs rub or gallop GI: Soft, nondistended, nontender, bowel sounds present Musculoskeletal: Warm, no edema CNS: Alert oriented x3, normal motor tone and power in upper extremities, markedly diminished sensation in bilateral lower extremities with increased reflexes, plantars equivocal.  Unable to roll over to the side to examine the back.   Data Review:    CBC Recent Labs  Lab 12/24/18 0041 12/26/18 1300  WBC 10.8* 12.2*  HGB 12.8 12.7  HCT 39.7 38.9  PLT 399 410*  MCV 90.6 89.2  MCH 29.2 29.1  MCHC 32.2 32.6  RDW 13.2 13.2   ------------------------------------------------------------------------------------------------------------------  Chemistries  Recent Labs  Lab 12/24/18 0041 12/26/18 1300  NA 137 137  K 4.0 3.6  CL 106 103  CO2 17* 25  GLUCOSE 172* 77  BUN 17 14  CREATININE 1.05* 1.05*  CALCIUM 9.2 8.6*  AST 20 19  ALT 24 26  ALKPHOS 60 55  BILITOT 0.3 0.4   ------------------------------------------------------------------------------------------------------------------ estimated creatinine clearance is 97.8 mL/min (A) (by C-G formula based on SCr of 1.05 mg/dL (H)). ------------------------------------------------------------------------------------------------------------------ No results for input(s): TSH, T4TOTAL, T3FREE, THYROIDAB in the last 72 hours.  Invalid input(s): FREET3  Coagulation profile No results for input(s): INR, PROTIME in the last 168 hours. ------------------------------------------------------------------------------------------------------------------- Recent Labs    12/24/18 0427  DDIMER 0.30    -------------------------------------------------------------------------------------------------------------------  Cardiac Enzymes Recent Labs  Lab 12/24/18 0041  TROPONINI <0.03   ------------------------------------------------------------------------------------------------------------------ No results found for: BNP   ---------------------------------------------------------------------------------------------------------------  Urinalysis    Component Value Date/Time   COLORURINE STRAW (A) 12/24/2018 0423   APPEARANCEUR CLEAR 12/24/2018 0423  LABSPEC 1.013 12/24/2018 0423   PHURINE 5.0 12/24/2018 0423   GLUCOSEU NEGATIVE 12/24/2018 0423   HGBUR NEGATIVE 12/24/2018 0423   BILIRUBINUR NEGATIVE 12/24/2018 0423   KETONESUR NEGATIVE 12/24/2018 0423   PROTEINUR NEGATIVE 12/24/2018 0423   NITRITE NEGATIVE 12/24/2018 0423   LEUKOCYTESUR NEGATIVE 12/24/2018 0423    ----------------------------------------------------------------------------------------------------------------   Imaging Results:    Ct Head Wo Contrast  Result Date: 12/26/2018 CLINICAL DATA:  HEADACHE WITH DIZZINESS X2 DAYS, H/O VERTIGO AND MIGRAINESCannot find appropriate structured reason for exam - see REASON FOR EXAM (FREE TEXT) acute headache with neuro deficit EXAM: CT HEAD WITHOUT CONTRAST TECHNIQUE: Contiguous axial images were obtained from the base of the skull through the vertex without intravenous contrast. COMPARISON:  None. FINDINGS: Brain: No acute intracranial hemorrhage. No focal mass lesion. No CT evidence of acute infarction. No midline shift or mass effect. No hydrocephalus. Basilar cisterns are patent. Vascular: No hyperdense vessel or unexpected calcification. Skull: Normal. Negative for fracture or focal lesion. Sinuses/Orbits: Paranasal sinuses and mastoid air cells are clear. Orbits are clear. Other: None. IMPRESSION: Normal head CT. Electronically Signed   By: Suzy Bouchard M.D.   On:  12/26/2018 13:07   Mr Lumbar Spine Wo Contrast  Result Date: 12/26/2018 CLINICAL DATA:  38 year old female with 2 days of back pain, lower extremity heaviness and decreased sensation. Incontinence. "T10 deficit". EXAM: MRI THORACIC WITHOUT AND WITH CONTRAST MRI LUMBAR SPINE WITHOUT CONTRAST TECHNIQUE: Multiplanar and multiecho pulse sequences of the thoracic and lumbar spine were obtained without and with intravenous contrast. CONTRAST:  10 milliliters Gadavist COMPARISON:  Lumbar MRI 09/04/2018.  Total spine CT 09/04/2018. FINDINGS: MRI THORACIC SPINE FINDINGS Limited cervical spine imaging:  Negative. Thoracic spine segmentation:  Appears to be normal. Alignment:  Stable, normal thoracic kyphosis. Vertebrae: No marrow edema or evidence of acute osseous abnormality. No abnormal enhancement identified. Visualized bone marrow signal is within normal limits. Cord: Spinal cord signal is within normal limits at all visualized levels. No abnormal intradural enhancement. The conus appears normal at T12-L1. Paraspinal and other soft tissues: Negative visible chest and upper abdominal viscera. Negative paraspinal soft tissues aside from confluent subcutaneous edema in the lower thoracic and upper lumbar (series 11, image 8). Disc levels: No significant thoracic disc degeneration. There is intermittent thoracic posterior element degeneration, notable at: T1-T2: Mild to moderate facet hypertrophy.  No stenosis. T2-T3: Mild to moderate facet hypertrophy, no stenosis. T4-T5: Mild to moderate right side facet hypertrophy.  No stenosis. T5-T6: Moderate or severe on the right facet hypertrophy including trace degenerative right facet joint fluid (series 12, image 33). No stenosis. T9-T10: Mild to moderate facet hypertrophy.  No stenosis. MRI LUMBAR SPINE FINDINGS Segmentation:  Normal, the same numbering system used in March. Alignment: Chronic grade 2 anterolisthesis of L5 on S1 is stable. Normal lumbar lordosis elsewhere.  Vertebrae: Chronic degenerative endplate changes at X9-J4. Chronic L5 pars fractures, better demonstrated on the March CT. No acute osseous abnormality identified. Intact visible sacrum and SI joints. Conus medullaris: Extends to the T12-L1 level and appears normal. Normal cauda quantum nerve roots aside from involvement by epidural lipomatosis in the lower lumbar spine. Paraspinal and other soft tissues: Lower thoracic and lumbar subcutaneous edema superimposed on large body habitus. Negative visible abdominal and pelvic viscera. Disc levels: Stable and negative T12-L1 through L3-L4. L4-L5: Negative disc. Mild bilateral facet hypertrophy. Increased and now moderate epidural lipomatosis (series 19, image 5) resulting in new mild spinal stenosis. No foraminal stenosis. L5-S1: Chronic pars fracture  with grade 2 anterolisthesis and severe disc space loss. Stable pseudo disc and moderate posterior element hypertrophy. Chronic severe bilateral L5 foraminal stenosis appears stable. Substantially progressed epidural lipomatosis at this level which now completely effaces CSF from the thecal sac which now has a compressed Trefoil configuration (series 19, image 9). IMPRESSION: 1. Substantially increased lumbar epidural lipomatosis since March at L4-L5 and L5-S1 levels resulting in new spinal stenosis. 2. Otherwise stable MRI appearance of the lumbar spine since 09/04/2018: Chronic L5 pars fractures with grade 2 spondylolisthesis and severe bilateral foraminal stenosis at L5-S1. 3. The thoracic spine is negative except for intermittent facet arthropathy not resulting in stenosis. No thoracic spinal cord abnormality identified. Electronically Signed   By: Odessa FlemingH  Hall M.D.   On: 12/26/2018 16:47   Mr Thoracic Spine W Wo Contrast  Result Date: 12/26/2018 CLINICAL DATA:  38 year old female with 2 days of back pain, lower extremity heaviness and decreased sensation. Incontinence. "T10 deficit". EXAM: MRI THORACIC WITHOUT AND WITH  CONTRAST MRI LUMBAR SPINE WITHOUT CONTRAST TECHNIQUE: Multiplanar and multiecho pulse sequences of the thoracic and lumbar spine were obtained without and with intravenous contrast. CONTRAST:  10 milliliters Gadavist COMPARISON:  Lumbar MRI 09/04/2018.  Total spine CT 09/04/2018. FINDINGS: MRI THORACIC SPINE FINDINGS Limited cervical spine imaging:  Negative. Thoracic spine segmentation:  Appears to be normal. Alignment:  Stable, normal thoracic kyphosis. Vertebrae: No marrow edema or evidence of acute osseous abnormality. No abnormal enhancement identified. Visualized bone marrow signal is within normal limits. Cord: Spinal cord signal is within normal limits at all visualized levels. No abnormal intradural enhancement. The conus appears normal at T12-L1. Paraspinal and other soft tissues: Negative visible chest and upper abdominal viscera. Negative paraspinal soft tissues aside from confluent subcutaneous edema in the lower thoracic and upper lumbar (series 11, image 8). Disc levels: No significant thoracic disc degeneration. There is intermittent thoracic posterior element degeneration, notable at: T1-T2: Mild to moderate facet hypertrophy.  No stenosis. T2-T3: Mild to moderate facet hypertrophy, no stenosis. T4-T5: Mild to moderate right side facet hypertrophy.  No stenosis. T5-T6: Moderate or severe on the right facet hypertrophy including trace degenerative right facet joint fluid (series 12, image 33). No stenosis. T9-T10: Mild to moderate facet hypertrophy.  No stenosis. MRI LUMBAR SPINE FINDINGS Segmentation:  Normal, the same numbering system used in March. Alignment: Chronic grade 2 anterolisthesis of L5 on S1 is stable. Normal lumbar lordosis elsewhere. Vertebrae: Chronic degenerative endplate changes at L5-S1. Chronic L5 pars fractures, better demonstrated on the March CT. No acute osseous abnormality identified. Intact visible sacrum and SI joints. Conus medullaris: Extends to the T12-L1 level and  appears normal. Normal cauda quantum nerve roots aside from involvement by epidural lipomatosis in the lower lumbar spine. Paraspinal and other soft tissues: Lower thoracic and lumbar subcutaneous edema superimposed on large body habitus. Negative visible abdominal and pelvic viscera. Disc levels: Stable and negative T12-L1 through L3-L4. L4-L5: Negative disc. Mild bilateral facet hypertrophy. Increased and now moderate epidural lipomatosis (series 19, image 5) resulting in new mild spinal stenosis. No foraminal stenosis. L5-S1: Chronic pars fracture with grade 2 anterolisthesis and severe disc space loss. Stable pseudo disc and moderate posterior element hypertrophy. Chronic severe bilateral L5 foraminal stenosis appears stable. Substantially progressed epidural lipomatosis at this level which now completely effaces CSF from the thecal sac which now has a compressed Trefoil configuration (series 19, image 9). IMPRESSION: 1. Substantially increased lumbar epidural lipomatosis since March at L4-L5 and L5-S1 levels resulting in new  spinal stenosis. 2. Otherwise stable MRI appearance of the lumbar spine since 09/04/2018: Chronic L5 pars fractures with grade 2 spondylolisthesis and severe bilateral foraminal stenosis at L5-S1. 3. The thoracic spine is negative except for intermittent facet arthropathy not resulting in stenosis. No thoracic spinal cord abnormality identified. Electronically Signed   By: Odessa FlemingH  Hall M.D.   On: 12/26/2018 16:47   Dg Chest Port 1 View  Result Date: 12/26/2018 CLINICAL DATA:  Patient presents to ed the via [facility] states she was at work and felt like she was going to faint and lowered herself to the floor. For the last several weeks c/o lower back pain states she has tingling and numbness in both legs. EXAM: PORTABLE CHEST 1 VIEW COMPARISON:  12/24/18 FINDINGS: The heart size and mediastinal contours are within normal limits. Both lungs are clear. The visualized skeletal structures are  unremarkable. IMPRESSION: No active disease. Electronically Signed   By: Elige KoHetal  Patel   On: 12/26/2018 12:06    My personal review of EKG: Sinus tachycardia at 109, no ST-T changes.   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 Admit to progressive care unit.  Frequent neuro checks (every 2 hours for now). Pain control with IV Dilaudid 1 mg every 3 hours as needed.  Will resume her home dose of Vicodin.  (Has been on it since recently). We will place her on oral Decadron 4 mg every 6 hours.  Add PPI. Ordered Foley catheter given her urinary incontinence. Neurosurgery consultation appreciated.  Plans on possible surgery on 6/26. Neurology consult appreciated.   Active Problems:   Low back pain Pain control as outlined above.  Hypothyroidism Resume Synthroid.  Check TSH  History of asthma with allergies Resume home inhalers and Singulair.  Near syncope  Reported to be about having dizziness and being off balance. EKG unremarkable.  Continue telemetry monitor.  Will obtain 2D echo to evaluate further.  Check TSH.     Obesity, Class III, BMI 40-49.9 (morbid obesity) (HCC) Needs counseling on weight loss in excess.  ?  Obstructive sleep apnea As per her pulmonary during her visit yesterday and planned on split-night sleep study for screening.  Chronic depression Continue Paxil and bedtime trazodone.  DVT Prophylaxis: Subcu Lovenox  AM Labs Ordered, also please review Full Orders  Family Communication: Admission, patients condition and plan of care including tests being ordered have been discussed with the patient at bedside  Code Status full code  Likely DC to pending hospital course  Condition GUARDED    Consults called: Neurosurgery/neurology  Admission status: Inpatient Patient presenting with acute paraplegia secondary to lumbar spinal stenosis requiring  monitoring in progressive care unit with frequent neuro checks, neurosurgical intervention in the next 48 hours and evaluation for near syncope.  Patient will also need to be monitored in the hospital for postoperative neurological recovery.  She has needs to be monitored as inpatient for greater than sign 2 midnights.  Time spent in minutes : 60   Wren Gallaga M.D on 12/26/2018 at 5:42 PM  Between 7am to 7pm - Pager - [phone]. After 7pm go to [email or web address] - password [email or web address]  [facility] - Office  [phone]

## 2018-12-26 NOTE — ED Notes (Signed)
ED TO INPATIENT HANDOFF REPORT  ED Nurse Name and Phone #: Takira Sherrin [phone]  S Name/Age/Gender Carolyn Castro 38 y.o. female Room/Bed: H024C/H024C  Code Status   Code Status: Prior  Home/SNF/Other Home Patient oriented to: self, place, time and situation Is this baseline? Yes   Triage Complete: Triage complete  Chief Complaint Near Syncope  Triage Note Patient presents to ed the via [facility] states she was at work and felt like she was going to faint and lowered herself to the floor. For the last several weeks c/o lower back pain states she has tingling and numbness in both legs. States she was her on Sunday and was told to follow up with Neuro hasn't seen yet. Patient was given Fentayl 100 mg per ems   Pt reported to EDP that she  Has had episodes of stool / urine  incont.   Allergies Allergies  Allergen Reactions  . Ciprofloxacin Hcl Diarrhea and Other (See Comments)    Developed C-Diff    Level of Care/Admitting Diagnosis ED Disposition    ED Disposition Condition Comment   Admit  [facility] [[id number]]  Level of Care: Progressive [102]  Covid Evaluation: Confirmed COVID Negative  Diagnosis: Lumbar stenosis with neurogenic claudication [454098][[id number]]  Admitting Physician: Eddie NorthHUNGEL, NISHANT [phone][[id number]]  Attending Physician: Eddie NorthHUNGEL, NISHANT [phone][[id number]]  Estimated length of stay: past midnight tomorrow  Certification:: I certify this patient will need inpatient services for at least 2 midnights  PT Class (Do Not Modify): Inpatient [101]  PT Acc Code (Do Not Modify): Private [1]       B Medical/Surgery History Past Medical History:  Diagnosis Date  . Anxiety   . Asthma   . Back pain   . Hypertension   . Hypothyroid    Past Surgical History:  Procedure Laterality Date  . COLONOSCOPY    . LAPAROSCOPIC OVARIAN CYSTECTOMY Left 06/29/2018   Procedure: LAPAROSCOPIC OVARIAN CYSTECTOMY;  Surgeon: Gerald Leitzole, Tara, MD;  Location: [facility];  Service: Gynecology;  Laterality: Left;  . TONSILLECTOMY    . WISDOM TOOTH EXTRACTION       A IV Location/Drains/Wounds Patient Lines/Drains/Airways Status   Active Line/Drains/Airways    Name:   Placement date:   Placement time:   Site:   Days:   Peripheral IV 12/26/18 Right Antecubital   12/26/18    1119    Antecubital   less than 1          Intake/Output Last 24 hours No intake or output data in the 24 hours ending 12/26/18 1908  Labs/Imaging Results for orders placed or performed during the hospital encounter of 12/26/18 (from the past 48 hour(s))  CBC     Status: Abnormal   Collection Time: 12/26/18  1:00 PM  Result Value Ref Range   WBC 12.2 (H) 4.0 - 10.5 K/uL   RBC 4.36 3.87 - 5.11 MIL/uL   Hemoglobin 12.7 12.0 - 15.0 g/dL   HCT 95.638.9 21.336.0 - 08.646.0 %   MCV 89.2 80.0 - 100.0 fL   MCH 29.1 26.0 - 34.0 pg   MCHC 32.6 30.0 - 36.0 g/dL   RDW 57.813.2 46.911.5 - 62.915.5 %   Platelets 410 (H) 150 - 400 K/uL   nRBC 0.0 0.0 - 0.2 %    Comment: Performed at [facility], [address] [place], [place] [address]  Comprehensive metabolic panel     Status: Abnormal   Collection Time: 12/26/18  1:00 PM  Result Value Ref Range   Sodium 137 135 - 145 mmol/L   Potassium 3.6 3.5 - 5.1 mmol/L   Chloride 103 98 - 111 mmol/L   CO2 25 22 - 32 mmol/L   Glucose, Bld 77 70 - 99 mg/dL   BUN 14 6 - 20 mg/dL   Creatinine, Ser 1.05 (H) 0.44 - 1.00 mg/dL   Calcium 8.6 (L) 8.9 - 10.3 mg/dL   Total Protein 6.5 6.5 - 8.1 g/dL   Albumin 3.2 (L) 3.5 - 5.0 g/dL   AST 19 15 - 41 U/L   ALT 26 0 - 44 U/L   Alkaline Phosphatase 55 38 - 126 U/L   Total Bilirubin 0.4 0.3 - 1.2 mg/dL   GFR calc non Af Amer >60 >60 mL/min   GFR calc Af Amer >60 >60 mL/min   Anion gap 9 5 - 15    Comment: Performed at [facility], [address], [place], [place] [address]  SARS Coronavirus 2 (CEPHEID - Performed in [facility] lab), Hosp Order     Status: None   Collection Time: 12/26/18  1:00  PM   Specimen: Nasopharyngeal Swab  Result Value Ref Range   SARS Coronavirus 2 NEGATIVE NEGATIVE    Comment: (NOTE) If result is NEGATIVE SARS-CoV-2 target nucleic acids are NOT DETECTED. The SARS-CoV-2 RNA is generally detectable in upper and lower  respiratory specimens during the acute phase of infection. The lowest  concentration of SARS-CoV-2 viral copies this assay can detect is 250  copies / mL. A negative result does not preclude SARS-CoV-2 infection  and should not be used as the sole basis for treatment or other  patient management decisions.  A negative result may occur with  improper specimen collection / handling, submission of specimen other  than nasopharyngeal swab, presence of viral mutation(s) within the  areas targeted by this assay, and inadequate number of viral copies  (<250 copies / mL). A negative result must be combined with clinical  observations, patient history, and epidemiological information. If result is POSITIVE SARS-CoV-2 target nucleic acids are DETECTED. The SARS-CoV-2 RNA is generally detectable in upper and lower  respiratory specimens dur ing the acute phase of infection.  Positive  results are indicative of active infection with SARS-CoV-2.  Clinical  correlation with patient history and other diagnostic information is  necessary to determine patient infection status.  Positive results do  not rule out bacterial infection or co-infection with other viruses. If result is PRESUMPTIVE POSTIVE SARS-CoV-2 nucleic acids MAY BE PRESENT.   A presumptive positive result was obtained on the submitted specimen  and confirmed on repeat testing.  While 2019 novel coronavirus  (SARS-CoV-2) nucleic acids may be present in the submitted sample  additional confirmatory testing may be necessary for epidemiological  and / or clinical management purposes  to differentiate between  SARS-CoV-2 and other Sarbecovirus currently known to infect humans.  If clinically  indicated additional testing with an alternate test  methodology [phone]) is advised. The SARS-CoV-2 RNA is generally  detectable in upper and lower respiratory sp ecimens during the acute  phase of infection. The expected result is Negative. Fact Sheet for Patients:  StrictlyIdeas.no Fact Sheet for Healthcare Providers: BankingDealers.co.za This test is not yet approved or cleared by the [place] FDA and has been authorized for detection and/or diagnosis of SARS-CoV-2 by FDA under an Emergency Use Authorization (EUA).  This EUA will remain in effect (meaning this test can be used)  for the duration of the COVID-19 declaration under Section 564(b)(1) of the Act, 21 U.S.C. section 360bbb-3(b)(1), unless the authorization is terminated or revoked sooner. Performed at [facility], [address] [place], [place] [address]    Ct Head Wo Contrast  Result Date: 12/26/2018 CLINICAL DATA:  HEADACHE WITH DIZZINESS X2 DAYS, H/O VERTIGO AND MIGRAINESCannot find appropriate structured reason for exam - see REASON FOR EXAM (FREE TEXT) acute headache with neuro deficit EXAM: CT HEAD WITHOUT CONTRAST TECHNIQUE: Contiguous axial images were obtained from the base of the skull through the vertex without intravenous contrast. COMPARISON:  None. FINDINGS: Brain: No acute intracranial hemorrhage. No focal mass lesion. No CT evidence of acute infarction. No midline shift or mass effect. No hydrocephalus. Basilar cisterns are patent. Vascular: No hyperdense vessel or unexpected calcification. Skull: Normal. Negative for fracture or focal lesion. Sinuses/Orbits: Paranasal sinuses and mastoid air cells are clear. Orbits are clear. Other: None. IMPRESSION: Normal head CT. Electronically Signed   By: Genevive BiStewart  Edmunds M.D.   On: 12/26/2018 13:07   Mr Lumbar Spine Wo Contrast  Result Date: 12/26/2018 CLINICAL DATA:  38 year old female with 2 days of back pain,  lower extremity heaviness and decreased sensation. Incontinence. "T10 deficit". EXAM: MRI THORACIC WITHOUT AND WITH CONTRAST MRI LUMBAR SPINE WITHOUT CONTRAST TECHNIQUE: Multiplanar and multiecho pulse sequences of the thoracic and lumbar spine were obtained without and with intravenous contrast. CONTRAST:  10 milliliters Gadavist COMPARISON:  Lumbar MRI 09/04/2018.  Total spine CT 09/04/2018. FINDINGS: MRI THORACIC SPINE FINDINGS Limited cervical spine imaging:  Negative. Thoracic spine segmentation:  Appears to be normal. Alignment:  Stable, normal thoracic kyphosis. Vertebrae: No marrow edema or evidence of acute osseous abnormality. No abnormal enhancement identified. Visualized bone marrow signal is within normal limits. Cord: Spinal cord signal is within normal limits at all visualized levels. No abnormal intradural enhancement. The conus appears normal at T12-L1. Paraspinal and other soft tissues: Negative visible chest and upper abdominal viscera. Negative paraspinal soft tissues aside from confluent subcutaneous edema in the lower thoracic and upper lumbar (series 11, image 8). Disc levels: No significant thoracic disc degeneration. There is intermittent thoracic posterior element degeneration, notable at: T1-T2: Mild to moderate facet hypertrophy.  No stenosis. T2-T3: Mild to moderate facet hypertrophy, no stenosis. T4-T5: Mild to moderate right side facet hypertrophy.  No stenosis. T5-T6: Moderate or severe on the right facet hypertrophy including trace degenerative right facet joint fluid (series 12, image 33). No stenosis. T9-T10: Mild to moderate facet hypertrophy.  No stenosis. MRI LUMBAR SPINE FINDINGS Segmentation:  Normal, the same numbering system used in March. Alignment: Chronic grade 2 anterolisthesis of L5 on S1 is stable. Normal lumbar lordosis elsewhere. Vertebrae: Chronic degenerative endplate changes at L5-S1. Chronic L5 pars fractures, better demonstrated on the March CT. No acute osseous  abnormality identified. Intact visible sacrum and SI joints. Conus medullaris: Extends to the T12-L1 level and appears normal. Normal cauda quantum nerve roots aside from involvement by epidural lipomatosis in the lower lumbar spine. Paraspinal and other soft tissues: Lower thoracic and lumbar subcutaneous edema superimposed on large body habitus. Negative visible abdominal and pelvic viscera. Disc levels: Stable and negative T12-L1 through L3-L4. L4-L5: Negative disc. Mild bilateral facet hypertrophy. Increased and now moderate epidural lipomatosis (series 19, image 5) resulting in new mild spinal stenosis. No foraminal stenosis. L5-S1: Chronic pars fracture with grade 2 anterolisthesis and severe disc space loss. Stable pseudo disc and moderate posterior element hypertrophy. Chronic severe bilateral L5 foraminal  stenosis appears stable. Substantially progressed epidural lipomatosis at this level which now completely effaces CSF from the thecal sac which now has a compressed Trefoil configuration (series 19, image 9). IMPRESSION: 1. Substantially increased lumbar epidural lipomatosis since March at L4-L5 and L5-S1 levels resulting in new spinal stenosis. 2. Otherwise stable MRI appearance of the lumbar spine since 09/04/2018: Chronic L5 pars fractures with grade 2 spondylolisthesis and severe bilateral foraminal stenosis at L5-S1. 3. The thoracic spine is negative except for intermittent facet arthropathy not resulting in stenosis. No thoracic spinal cord abnormality identified. Electronically Signed   By: Odessa FlemingH  Hall M.D.   On: 12/26/2018 16:47   Mr Thoracic Spine W Wo Contrast  Result Date: 12/26/2018 CLINICAL DATA:  38 year old female with 2 days of back pain, lower extremity heaviness and decreased sensation. Incontinence. "T10 deficit". EXAM: MRI THORACIC WITHOUT AND WITH CONTRAST MRI LUMBAR SPINE WITHOUT CONTRAST TECHNIQUE: Multiplanar and multiecho pulse sequences of the thoracic and lumbar spine were  obtained without and with intravenous contrast. CONTRAST:  10 milliliters Gadavist COMPARISON:  Lumbar MRI 09/04/2018.  Total spine CT 09/04/2018. FINDINGS: MRI THORACIC SPINE FINDINGS Limited cervical spine imaging:  Negative. Thoracic spine segmentation:  Appears to be normal. Alignment:  Stable, normal thoracic kyphosis. Vertebrae: No marrow edema or evidence of acute osseous abnormality. No abnormal enhancement identified. Visualized bone marrow signal is within normal limits. Cord: Spinal cord signal is within normal limits at all visualized levels. No abnormal intradural enhancement. The conus appears normal at T12-L1. Paraspinal and other soft tissues: Negative visible chest and upper abdominal viscera. Negative paraspinal soft tissues aside from confluent subcutaneous edema in the lower thoracic and upper lumbar (series 11, image 8). Disc levels: No significant thoracic disc degeneration. There is intermittent thoracic posterior element degeneration, notable at: T1-T2: Mild to moderate facet hypertrophy.  No stenosis. T2-T3: Mild to moderate facet hypertrophy, no stenosis. T4-T5: Mild to moderate right side facet hypertrophy.  No stenosis. T5-T6: Moderate or severe on the right facet hypertrophy including trace degenerative right facet joint fluid (series 12, image 33). No stenosis. T9-T10: Mild to moderate facet hypertrophy.  No stenosis. MRI LUMBAR SPINE FINDINGS Segmentation:  Normal, the same numbering system used in March. Alignment: Chronic grade 2 anterolisthesis of L5 on S1 is stable. Normal lumbar lordosis elsewhere. Vertebrae: Chronic degenerative endplate changes at L5-S1. Chronic L5 pars fractures, better demonstrated on the March CT. No acute osseous abnormality identified. Intact visible sacrum and SI joints. Conus medullaris: Extends to the T12-L1 level and appears normal. Normal cauda quantum nerve roots aside from involvement by epidural lipomatosis in the lower lumbar spine. Paraspinal and  other soft tissues: Lower thoracic and lumbar subcutaneous edema superimposed on large body habitus. Negative visible abdominal and pelvic viscera. Disc levels: Stable and negative T12-L1 through L3-L4. L4-L5: Negative disc. Mild bilateral facet hypertrophy. Increased and now moderate epidural lipomatosis (series 19, image 5) resulting in new mild spinal stenosis. No foraminal stenosis. L5-S1: Chronic pars fracture with grade 2 anterolisthesis and severe disc space loss. Stable pseudo disc and moderate posterior element hypertrophy. Chronic severe bilateral L5 foraminal stenosis appears stable. Substantially progressed epidural lipomatosis at this level which now completely effaces CSF from the thecal sac which now has a compressed Trefoil configuration (series 19, image 9). IMPRESSION: 1. Substantially increased lumbar epidural lipomatosis since March at L4-L5 and L5-S1 levels resulting in new spinal stenosis. 2. Otherwise stable MRI appearance of the lumbar spine since 09/04/2018: Chronic L5 pars fractures with grade 2 spondylolisthesis and  severe bilateral foraminal stenosis at L5-S1. 3. The thoracic spine is negative except for intermittent facet arthropathy not resulting in stenosis. No thoracic spinal cord abnormality identified. Electronically Signed   By: Odessa Fleming M.D.   On: 12/26/2018 16:47   Dg Chest Port 1 View  Result Date: 12/26/2018 CLINICAL DATA:  Patient presents to ed the via [facility] states she was at work and felt like she was going to faint and lowered herself to the floor. For the last several weeks c/o lower back pain states she has tingling and numbness in both legs. EXAM: PORTABLE CHEST 1 VIEW COMPARISON:  12/24/18 FINDINGS: The heart size and mediastinal contours are within normal limits. Both lungs are clear. The visualized skeletal structures are unremarkable. IMPRESSION: No active disease. Electronically Signed   By: Elige Ko   On: 12/26/2018 12:06    Pending Labs Unresulted Labs  (From admission, onward)    Start     Ordered   Signed and Held  CBC  (enoxaparin (LOVENOX)    CrCl >/= 30 ml/min)  Once,   R    Comments: Baseline for enoxaparin therapy IF NOT ALREADY DRAWN.  Notify MD if PLT < 100 K.    Signed and Held   Signed and Held  Creatinine, serum  (enoxaparin (LOVENOX)    CrCl >/= 30 ml/min)  Once,   R    Comments: Baseline for enoxaparin therapy IF NOT ALREADY DRAWN.    Signed and Held   Signed and Held  Creatinine, serum  (enoxaparin (LOVENOX)    CrCl >/= 30 ml/min)  Weekly,   R    Comments: while on enoxaparin therapy    Signed and Held          Vitals/Pain Today's Vitals   12/26/18 1116 12/26/18 1306 12/26/18 1346 12/26/18 1858  BP:   128/79 130/84  Pulse:   (!) 111 (!) 101  Resp:   (!) 23 18  Temp:      TempSrc:      SpO2:   96% 96%  Weight: 127 kg     Height:  (1.626 m)     PainSc: Isolation Precautions No active isolations  Medications Medications  sodium chloride 0.9 % bolus 1,000 mL (1,000 mLs Intravenous New Bag/Given 12/26/18 1307)  HYDROmorphone (DILAUDID) injection 1 mg (1 mg Intravenous Given 12/26/18 1304)  gadobutrol (GADAVIST) 1 MMOL/ML injection 10 mL (10 mLs Intravenous Contrast Given 12/26/18 1600)    Mobility walks High fall risk   Focused Assessments Neuro Assessment Handoff:  Swallow screen pass? n/a         Neuro Assessment:   Neuro Checks:      Last Documented NIHSS Modified Score:   Has TPA been given? No If patient is a Neuro Trauma and patient is going to OR before floor call report to 4N Charge nurse: [phone] or [phone]     R Recommendations: See Admitting Provider Note  Report given to:   Additional Notes:

## 2018-12-26 NOTE — Consult Note (Signed)
Chief Complaint   Chief Complaint  Patient presents with   Near Syncope    HPI   Consult requested by: Dr Pilar PlateBero Reason for consult: lower extremity weakness, back pain  HPI: Carolyn Castro is a 38 y.o. female with history hypothyroidism, asthma and chronic pain who presented to ER with acute BLE weakness. Prior to onset, developed HA and presyncope. She has been evaluated by Neurology and MRI T and L spine were recommended. 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 Also seen noted is epidural lipomatosis which now completely effaces CSF from thecal sac.  T spine significant for facet arthropathy, no spinal stenosis. Neurosurgery consultation requested.    She reports a long standing history of intermittent, mild chronic lower back pain. In March she fell which caused significant lower back and bilateral lower extremity pain. She was admitted for pain control 09/04/2018 with discharge 09/06/2018 and subsequently followed up with Dr Ollen BowlHarkins (pain management) outpt. She underwent lumbar ESI which provided 3-4 weeks of relief, but pain returned. Starting 1-2 weeks ago, pain started to worsen without new injury. She called Dr Ollen BowlHarkins several days ago and was rx gabapentin without relief. She presented today due to severe, uncontrolled LBP and the inability to move BLE. Pain starts midline lower back, radiates to hips and posterolaterally to feet. Pain is constant 10/10 Endorses numbness/tingling from hips to feet. Reports inability to move BLE. Does endorse 1 episode of urinary incontinence and 3 episodes of bowel incontinence over the past several days.    She also noted lightheadedness and headache prior to acute BLE weakness.    Patient Active Problem List   Diagnosis Date Noted   Asthma 12/25/2018   Snoring 12/25/2018   Low back pain 09/04/2018   Hypothyroidism 09/04/2018   Infected incision  07/04/2018   Adnexal mass 06/29/2018   Pelvic pain 06/29/2018    PMH: Past Medical History:  Diagnosis Date   Anxiety    Asthma    Back pain    Hypertension    Hypothyroid     PSH: Past Surgical History:  Procedure Laterality Date   COLONOSCOPY     LAPAROSCOPIC OVARIAN CYSTECTOMY Left 06/29/2018   Procedure: LAPAROSCOPIC OVARIAN CYSTECTOMY;  Surgeon: Gerald Leitzole, Tara, MD;  Location: [facility];  Service: Gynecology;  Laterality: Left;   TONSILLECTOMY     WISDOM TOOTH EXTRACTION      (Not in a hospital admission)   SH: Social History   Tobacco Use   Smoking status: Never Smoker   Smokeless tobacco: Never Used  Substance Use Topics   Alcohol use: Never    Frequency: Never   Drug use: Never    MEDS: Prior to Admission medications   Medication Sig Start Date End Date Taking? Authorizing Provider  acetaminophen (TYLENOL) 500 MG tablet Take 1,000 mg by mouth every 6 (six) hours as needed (for pain).     [provider]  albuterol (ACCUNEB) 0.63 MG/3ML nebulizer solution Take 1 ampule by nebulization every 6 (six) hours as needed for wheezing.    [provider]  albuterol (VENTOLIN HFA) 108 (90 Base) MCG/ACT inhaler Inhale 2 puffs into the lungs every 6 (six) hours as needed for wheezing or shortness of breath.    [provider]  ALPRAZolam Prudy Feeler(XANAX) 0.5 MG tablet Take 1 tablet (0.5 mg total) by mouth 3 (three) times daily as needed for anxiety. 09/27/18   Cottle,  Steva Readyarey G Jr., MD  budesonide-formoterol [facility](SYMBICORT) 160-4.5 MCG/ACT inhaler Inhale 2 puffs into the lungs 2 (two) times daily. 12/25/18   Leslye PeerByrum, Robert S, MD  budesonide-formoterol (SYMBICORT) 160-4.5 MCG/ACT inhaler Inhale 2 puffs into the lungs 2 (two) times daily. 12/25/18   Leslye PeerByrum, Robert S, MD  CRANBERRY PO Take 1 tablet by mouth 2 (two) times daily.     [provider]  dexamethasone (DECADRON) 2 MG tablet Take 1 tablet (2 mg total) by mouth 2 (two)  times daily. 12/16/18   Palumbo, April, MD  Diclofenac Sodium CR 100 MG 24 hr tablet Take 1 tablet (100 mg total) by mouth daily. 12/16/18   Palumbo, April, MD  diphenhydrAMINE (BENADRYL) 25 MG tablet Take 50 mg by mouth as needed for itching or allergies.     [provider]  Docusate Sodium (STOOL SOFTENER) 100 MG capsule Take 100 mg by mouth 2 (two) times daily.    [provider]  fexofenadine (ALLEGRA) 180 MG tablet Take 180 mg by mouth daily.    [provider]  HYDROcodone-acetaminophen (NORCO/VICODIN) 5-325 MG tablet Take 1 tablet by mouth every 6 (six) hours as needed for severe pain. 12/24/18   Zadie RhineWickline, Donald, MD  ibuprofen (ADVIL,MOTRIN) 200 MG tablet Take 800 mg by mouth every 6 (six) hours as needed (for pain).     [provider]  levothyroxine (SYNTHROID, LEVOTHROID) 75 MCG tablet Take 75 mcg by mouth daily before breakfast.    [provider]  lidocaine (LIDODERM) 5 % Place 1 patch onto the skin daily. Remove & Discard patch within 12 hours or as directed by MD Patient not taking: Reported on 12/25/2018 12/16/18   Palumbo, April, MD  mometasone [facility](ASMANEX) 220 MCG/INH inhaler Inhale 2 puffs into the lungs 2 (two) times daily.     [provider]  mometasone (NASONEX) 50 MCG/ACT nasal spray Place 2 sprays into the nose daily.    [provider]  montelukast (SINGULAIR) 10 MG tablet Take 10 mg by mouth every evening.     [provider]  norethindrone-ethinyl estradiol (MICROGESTIN,JUNEL,LOESTRIN) 1-20 MG-MCG tablet Take 1 tablet by mouth daily.    [provider]  omeprazole (PRILOSEC OTC) 20 MG tablet Take 20 mg by mouth 2 (two) times daily.     [provider]  ondansetron (ZOFRAN ODT) 4 MG disintegrating tablet Take 1 tablet (4 mg total) by mouth every 8 (eight) hours as needed for nausea or vomiting. 08/15/18   Petrucelli, Samantha R, PA-C  PARoxetine (PAXIL) 30 MG tablet Take 2 tablets (60 mg  total) by mouth daily. 09/27/18   Lauraine Rinneottle, Carey G Jr., MD  traZODone (DESYREL) 50 MG tablet TAKE 1 TO 2 TABLETS(50 TO 100 MG) BY MOUTH AT BEDTIME 10/15/18   Cottle, Steva Readyarey G Jr., MD    ALLERGY: Allergies  Allergen Reactions   Ciprofloxacin Hcl Diarrhea and Other (See Comments)    Developed C-Diff    Social History   Tobacco Use   Smoking status: Never Smoker   Smokeless tobacco: Never Used  Substance Use Topics   Alcohol use: Never    Frequency: Never     Family History  Problem Relation Age of Onset   Hypertension Mother    Hypertension Father      ROS   Review of Systems  Constitutional: Negative.   HENT: Negative.   Eyes: Negative for blurred vision, double vision and photophobia.  Respiratory: Negative.   Cardiovascular: Negative.   Gastrointestinal: Negative for nausea  and vomiting.  Genitourinary: Negative.   Musculoskeletal: Positive for back pain, falls, joint pain and myalgias. Negative for neck pain.  Skin: Negative.   Neurological: Positive for tingling (from approx T10 down), sensory change (from approx T10 down), focal weakness (BLE) and weakness (BLE). Negative for dizziness, tremors, speech change, seizures, loss of consciousness and headaches.    Exam   Vitals:   12/26/18 1059 12/26/18 1346  BP: (!) 151/97 128/79  Pulse: (!) 111 (!) 111  Resp: (!) 22 (!) 23  Temp: 98 F (36.7 C)   SpO2: 100% 96%   General appearance: obese female, resting on stretcher, NAD Eyes: No scleral injection Cardiovascular: Regular rate and rhythm without murmurs, rubs, gallops. No edema or variciosities. Distal pulses normal. Pulmonary: Effort normal, non-labored breathing Musculoskeletal:     Muscle tone upper extremities: Normal    Muscle tone lower extremities: Normal    Motor exam: Upper Extremities Deltoid Bicep Tricep Grip  Right 5/5 5/5 5/5 5/5  Left 5/5 5/5 5/5 5/5   Lower Extremity IP Quad PF DF EHL  Right 2/5 2/5 3/5 2/5 3/5  Left 2/5 2/5 3/5 2/5  3/5   Neurological Mental Status:    - Patient is awake, alert, oriented to person, place, month, year, and situation    - Patient is able to give a clear and coherent history.    - No signs of aphasia or neglect Cranial Nerves    - II: Visual Fields are full. PERRL    - III/IV/VI: EOMI without ptosis or diploplia.     - V: Facial sensation is grossly normal    - VII: Facial movement is symmetric.     - VIII: hearing is intact to voice    - X: Uvula elevates symmetrically    - XI: Shoulder shrug is symmetric.    - XII: tongue is midline without atrophy or fasciculations.  Sensory: Sensation grossly intact to LT Deep Tendon Reflexes    - 3+ patellar, 2+ achilles, no clonus Plantars   - Toes are downgoing bilaterally. Cerebellar    - FNF normal  Results - Imaging/Labs   Results for orders placed or performed during the hospital encounter of 12/26/18 (from the past 48 hour(s))  CBC     Status: Abnormal   Collection Time: 12/26/18  1:00 PM  Result Value Ref Range   WBC 12.2 (H) 4.0 - 10.5 K/uL   RBC 4.36 3.87 - 5.11 MIL/uL   Hemoglobin 12.7 12.0 - 15.0 g/dL   HCT 16.1 09.6 - 04.5 %   MCV 89.2 80.0 - 100.0 fL   MCH 29.1 26.0 - 34.0 pg   MCHC 32.6 30.0 - 36.0 g/dL   RDW 40.9 81.1 - 91.4 %   Platelets 410 (H) 150 - 400 K/uL   nRBC 0.0 0.0 - 0.2 %    Comment: Performed at [facility], [address], [place], [place] [address]  Comprehensive metabolic panel     Status: Abnormal   Collection Time: 12/26/18  1:00 PM  Result Value Ref Range   Sodium 137 135 - 145 mmol/L   Potassium 3.6 3.5 - 5.1 mmol/L   Chloride 103 98 - 111 mmol/L   CO2 25 22 - 32 mmol/L   Glucose, Bld 77 70 - 99 mg/dL   BUN 14 6 - 20 mg/dL   Creatinine, Ser 6.21 (H) 0.44 - 1.00 mg/dL   Calcium 8.6 (L) 8.9 - 10.3 mg/dL   Total Protein 6.5  6.5 - 8.1 g/dL   Albumin 3.2 (L) 3.5 - 5.0 g/dL   AST 19 15 - 41 U/L   ALT 26 0 - 44 U/L   Alkaline Phosphatase 55 38 - 126 U/L   Total Bilirubin 0.4 0.3 - 1.2  mg/dL   GFR calc non Af Amer >60 >60 mL/min   GFR calc Af Amer >60 >60 mL/min   Anion gap 9 5 - 15    Comment: Performed at [facility], [address] [place], [place] [address]  SARS Coronavirus 2 (CEPHEID - Performed in [facility] hospital lab), Hosp Order     Status: None   Collection Time: 12/26/18  1:00 PM   Specimen: Nasopharyngeal Swab  Result Value Ref Range   SARS Coronavirus 2 NEGATIVE NEGATIVE    Comment: (NOTE) If result is NEGATIVE SARS-CoV-2 target nucleic acids are NOT DETECTED. The SARS-CoV-2 RNA is generally detectable in upper and lower  respiratory specimens during the acute phase of infection. The lowest  concentration of SARS-CoV-2 viral copies this assay can detect is 250  copies / mL. A negative result does not preclude SARS-CoV-2 infection  and should not be used as the sole basis for treatment or other  patient management decisions.  A negative result may occur with  improper specimen collection / handling, submission of specimen other  than nasopharyngeal swab, presence of viral mutation(s) within the  areas targeted by this assay, and inadequate number of viral copies  (<250 copies / mL). A negative result must be combined with clinical  observations, patient history, and epidemiological information. If result is POSITIVE SARS-CoV-2 target nucleic acids are DETECTED. The SARS-CoV-2 RNA is generally detectable in upper and lower  respiratory specimens dur ing the acute phase of infection.  Positive  results are indicative of active infection with SARS-CoV-2.  Clinical  correlation with patient history and other diagnostic information is  necessary to determine patient infection status.  Positive results do  not rule out bacterial infection or co-infection with other viruses. If result is PRESUMPTIVE POSTIVE SARS-CoV-2 nucleic acids MAY BE PRESENT.   A presumptive positive result was obtained on the submitted specimen  and confirmed on repeat  testing.  While 2019 novel coronavirus  (SARS-CoV-2) nucleic acids may be present in the submitted sample  additional confirmatory testing may be necessary for epidemiological  and / or clinical management purposes  to differentiate between  SARS-CoV-2 and other Sarbecovirus currently known to infect humans.  If clinically indicated additional testing with an alternate test  methodology [phone]([id number]) is advised. The SARS-CoV-2 RNA is generally  detectable in upper and lower respiratory sp ecimens during the acute  phase of infection. The expected result is Negative. Fact Sheet for Patients:  BoilerBrush.com.cyhttps://www.fda.gov/media/136312/download Fact Sheet for Healthcare Providers: https://pope.com/https://www.fda.gov/media/136313/download This test is not yet approved or cleared by the [place] FDA and has been authorized for detection and/or diagnosis of SARS-CoV-2 by FDA under an Emergency Use Authorization (EUA).  This EUA will remain in effect (meaning this test can be used) for the duration of the COVID-19 declaration under Section 564(b)(1) of the Act, 21 U.S.C. section 360bbb-3(b)(1), unless the authorization is terminated or revoked sooner. Performed at [facility], [address] [place], [place] [address]     Ct Head Wo Contrast  Result Date: 12/26/2018 CLINICAL DATA:  HEADACHE WITH DIZZINESS X2 DAYS, H/O VERTIGO AND MIGRAINESCannot find appropriate structured reason for exam - see REASON FOR EXAM (FREE TEXT) acute headache  with neuro deficit EXAM: CT HEAD WITHOUT CONTRAST TECHNIQUE: Contiguous axial images were obtained from the base of the skull through the vertex without intravenous contrast. COMPARISON:  None. FINDINGS: Brain: No acute intracranial hemorrhage. No focal mass lesion. No CT evidence of acute infarction. No midline shift or mass effect. No hydrocephalus. Basilar cisterns are patent. Vascular: No hyperdense vessel or unexpected calcification. Skull: Normal. Negative for fracture or  focal lesion. Sinuses/Orbits: Paranasal sinuses and mastoid air cells are clear. Orbits are clear. Other: None. IMPRESSION: Normal head CT. Electronically Signed   By: Suzy Bouchard M.D.   On: 12/26/2018 13:07   Dg Chest Port 1 View  Result Date: 12/26/2018 CLINICAL DATA:  Patient presents to ed the via [facility] states she was at work and felt like she was going to faint and lowered herself to the floor. For the last several weeks c/o lower back pain states she has tingling and numbness in both legs. EXAM: PORTABLE CHEST 1 VIEW COMPARISON:  12/24/18 FINDINGS: The heart size and mediastinal contours are within normal limits. Both lungs are clear. The visualized skeletal structures are unremarkable. IMPRESSION: No active disease. Electronically Signed   By: Kathreen Devoid   On: 12/26/2018 12:06   Impression/Plan   38 y.o. female with chronic lower back and bilateral lower extremity pain likely secondary to grade 2 anterolisthesis of L5 on S1 with resultant foraminal and spinal stenosis. She puts forth a poor effort during examination and exam is inconsistent at times. She is able to move BLE but does appear to be in significant discomfort from this.  She has been to the ED multiple times over the past several months. Given severity of pain and inability to perform ADLs, she will likely need to undergo L5-S1 fusion during this hospital stay. Rec admission under [place] for pain control. May need work up for presyncope, with acute lightheadedness and HA, prior to surgery. Will leave up to [facility]. Could tentatively plan for surgery Friday if cleared medically.  Ferne Reus, PA-C [place] [facility] [employer]

## 2018-12-26 NOTE — Progress Notes (Signed)
  NEUROSURGERY PROGRESS NOTE   Received call from Dr Sedonia Small, [facility], regarding patient. 38 year old with LBP and BLE pain and N/T for several months, now with acute paraplegia and sensory change from approx T10 down. Reportedly had abrupt HA and presyncope prior to deficits.  Imaging pending. Will await imaging.  Ferne Reus, PA-C [place] [facility] [employer]

## 2018-12-26 NOTE — ED Triage Notes (Addendum)
Patient presents to ed the via [facility] states she was at work and felt like she was going to faint and lowered herself to the floor. For the last several weeks c/o lower back pain states she has tingling and numbness in both legs. States she was her on Sunday and was told to follow up with Neuro hasn't seen yet. Patient was given Fentayl 100 mg per ems

## 2018-12-26 NOTE — ED Triage Notes (Signed)
Pt reported to EDP that she  Has had episodes of stool / urine  incont.

## 2018-12-27 ENCOUNTER — Telehealth: Payer: Self-pay | Admitting: *Deleted

## 2018-12-27 ENCOUNTER — Inpatient Hospital Stay (HOSPITAL_COMMUNITY): Payer: 59

## 2018-12-27 ENCOUNTER — Other Ambulatory Visit: Payer: Self-pay

## 2018-12-27 DIAGNOSIS — G4733 Obstructive sleep apnea (adult) (pediatric): Secondary | ICD-10-CM

## 2018-12-27 DIAGNOSIS — R55 Syncope and collapse: Secondary | ICD-10-CM

## 2018-12-27 LAB — ECHOCARDIOGRAM COMPLETE
Height: 64 in
Weight: 4511.49 oz

## 2018-12-27 LAB — TSH: TSH: 0.839 u[IU]/mL (ref 0.350–4.500)

## 2018-12-27 NOTE — Telephone Encounter (Signed)
-----   Message from Joellen Jersey sent at 12/27/2018  2:50 PM EDT ----- [facility] has denied this in lab study pt will have to have a HST and I will need an order

## 2018-12-27 NOTE — Progress Notes (Addendum)
PROGRESS NOTE                                                                                                                                                                                                             Patient Demographics:    Carolyn Castro, is a 38 y.o. female, DOB - [birth year], ZOX:[id number]  Admit date - 12/26/2018   Admitting Physician Eddie NorthNishant Shakeema Lippman, MD  Outpatient Primary MD for the patient is [name], Carol, MD  LOS - 1  Outpatient Specialists: Pain clinic (Dr. Ollen BowlHarkins)  Chief Complaint  Patient presents with   Near Syncope       Brief Narrative 38 year old morbidly obese female with history of asthma, allergies, chronic depression, hypothyroidism L5 and S1 neuroforaminal narrowing found in March this year after she presented with fall and low back pain being managed in the pain clinic as outpatient presented to the ED with progressive low back pain for past several days and 3-4 days of intermittent bowel and urinary incontinence.  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 Admitted for pain management and surgical intervention.   Subjective:   Patient reports that pain is stable on current regimen of Vicodin and morphine and is controlled when she is lying in bed.  Reports lower extremity sensation to be slightly better today.  Has not had bowel movement since admission.   Assessment  & Plan :    Principal Problem:   Acute paraplegia (HCC) Secondary to L5-S1 foraminal stenosis.  Pain control with Vicodin and PRN morphine.  Seen by neurosurgery and given her progressive pain plan on L5-S1 posterior lumbar interbody fusion (PLIF) on 6/26 (tomorrow).  N.p.o. after midnight. Added Decadron.  PT postop.  Active Problems: Hypothyroidism Continue Synthroid.  Check TSH.  Near syncope Reported having dizziness and being off balance last  several days.  No loss of consciousness.  EKG unremarkable.  Stable on telemetry.  Follow 2D echo.  Check TSH.    Obesity, Class III, BMI 40-49.9 (morbid obesity) (HCC) Needs counseling on weight loss in excess.  ?  Obstructive sleep apnea As per her pulmonary during her visit yesterday and planned on split-night sleep study for screening.  History of asthma with allergies Resume home inhaler and Singulair.  Chronic depression Continue Paxil and bedtime trazodone   Code  Status : Full code  Family Communication  : None at bedside  Disposition Plan  : Home pending postop course  Barriers For Discharge : Active symptoms  Consults  : Neurosurgery  Procedures  : MRI thoracic and lumbar spine, 2D echo  DVT Prophylaxis  :  Lovenox -  Lab Results  Component Value Date   PLT 410 (H) 12/26/2018    Antibiotics  :    Anti-infectives (From admission, onward)   None        Objective:   Vitals:   12/27/18 0320 12/27/18 0803 12/27/18 0846 12/27/18 1238  BP: 131/86 (!) 142/105  133/88  Pulse: 97 95 87 94  Resp: 18 17 17 17   Temp: 98.1 F (36.7 C) 97.8 F (36.6 C)  97.7 F (36.5 C)  TempSrc: Oral Oral  Oral  SpO2: 91% 93% 96% 97%  Weight:      Height:        Wt Readings from Last 3 Encounters:  12/26/18 127.9 kg  12/25/18 129.3 kg  12/24/18 118 kg     Intake/Output Summary (Last 24 hours) at 12/27/2018 1453 Last data filed at 12/27/2018 1423 Gross per 24 hour  Intake 3586 ml  Output 2850 ml  Net 736 ml     Physical Exam  Gen: not in distress HEENT: moist mucosa, supple neck Chest: clear b/l, no added sounds CVS: N S1&S2, no murmurs, GI: soft, NT, ND, BS+ Musculoskeletal: warm, no edema CNS: Alert and oriented, increased bilateral knee reflexes, diminished lower extremity sensation    Data Review:    CBC Recent Labs  Lab 12/24/18 0041 12/26/18 1300  WBC 10.8* 12.2*  HGB 12.8 12.7  HCT 39.7 38.9  PLT 399 410*  MCV 90.6 89.2  MCH 29.2 29.1    MCHC 32.2 32.6  RDW 13.2 13.2    Chemistries  Recent Labs  Lab 12/24/18 0041 12/26/18 1300  NA 137 137  K 4.0 3.6  CL 106 103  CO2 17* 25  GLUCOSE 172* 77  BUN 17 14  CREATININE 1.05* 1.05*  CALCIUM 9.2 8.6*  AST 20 19  ALT 24 26  ALKPHOS 60 55  BILITOT 0.3 0.4   ------------------------------------------------------------------------------------------------------------------ No results for input(s): CHOL, HDL, LDLCALC, TRIG, CHOLHDL, LDLDIRECT in the last 72 hours.  No results found for: HGBA1C ------------------------------------------------------------------------------------------------------------------ No results for input(s): TSH, T4TOTAL, T3FREE, THYROIDAB in the last 72 hours.  Invalid input(s): FREET3 ------------------------------------------------------------------------------------------------------------------ No results for input(s): VITAMINB12, FOLATE, FERRITIN, TIBC, IRON, RETICCTPCT in the last 72 hours.  Coagulation profile No results for input(s): INR, PROTIME in the last 168 hours.  No results for input(s): DDIMER in the last 72 hours.  Cardiac Enzymes Recent Labs  Lab 12/24/18 0041  TROPONINI <0.03   ------------------------------------------------------------------------------------------------------------------ No results found for: BNP  Inpatient Medications  Scheduled Meds:  budesonide  1 mg Nebulization BID   dexamethasone  4 mg Oral Q8H   enoxaparin (LOVENOX) injection  40 mg Subcutaneous Q24H   fluticasone  1 spray Each Nare Daily   levothyroxine  75 mcg Oral QAC breakfast   loratadine  10 mg Oral Daily   mometasone-formoterol  2 puff Inhalation BID   montelukast  10 mg Oral QPM   omeprazole  20 mg Oral BID AC   PARoxetine  60 mg Oral Daily   traZODone  50 mg Oral QHS   Continuous Infusions: PRN Meds:.acetaminophen **OR** acetaminophen, albuterol, ALPRAZolam, HYDROcodone-acetaminophen, HYDROmorphone (DILAUDID)  injection, ondansetron **OR** ondansetron (ZOFRAN) IV  Micro Results Recent  Results (from the past 240 hour(s))  SARS Coronavirus 2 (CEPHEID - Performed in [place] hospital lab), Hosp Order     Status: None   Collection Time: 12/26/18  1:00 PM   Specimen: Nasopharyngeal Swab  Result Value Ref Range Status   SARS Coronavirus 2 NEGATIVE NEGATIVE Final    Comment: (NOTE) If result is NEGATIVE SARS-CoV-2 target nucleic acids are NOT DETECTED. The SARS-CoV-2 RNA is generally detectable in upper and lower  respiratory specimens during the acute phase of infection. The lowest  concentration of SARS-CoV-2 viral copies this assay can detect is 250  copies / mL. A negative result does not preclude SARS-CoV-2 infection  and should not be used as the sole basis for treatment or other  patient management decisions.  A negative result may occur with  improper specimen collection / handling, submission of specimen other  than nasopharyngeal swab, presence of viral mutation(s) within the  areas targeted by this assay, and inadequate number of viral copies  (<250 copies / mL). A negative result must be combined with clinical  observations, patient history, and epidemiological information. If result is POSITIVE SARS-CoV-2 target nucleic acids are DETECTED. The SARS-CoV-2 RNA is generally detectable in upper and lower  respiratory specimens dur ing the acute phase of infection.  Positive  results are indicative of active infection with SARS-CoV-2.  Clinical  correlation with patient history and other diagnostic information is  necessary to determine patient infection status.  Positive results do  not rule out bacterial infection or co-infection with other viruses. If result is PRESUMPTIVE POSTIVE SARS-CoV-2 nucleic acids MAY BE PRESENT.   A presumptive positive result was obtained on the submitted specimen  and confirmed on repeat testing.  While 2019 novel coronavirus  (SARS-CoV-2) nucleic  acids may be present in the submitted sample  additional confirmatory testing may be necessary for epidemiological  and / or clinical management purposes  to differentiate between  SARS-CoV-2 and other Sarbecovirus currently known to infect humans.  If clinically indicated additional testing with an alternate test  methodology [phone]) is advised. The SARS-CoV-2 RNA is generally  detectable in upper and lower respiratory sp ecimens during the acute  phase of infection. The expected result is Negative. Fact Sheet for Patients:  StrictlyIdeas.no Fact Sheet for Healthcare Providers: BankingDealers.co.za This test is not yet approved or cleared by the [place] FDA and has been authorized for detection and/or diagnosis of SARS-CoV-2 by FDA under an Emergency Use Authorization (EUA).  This EUA will remain in effect (meaning this test can be used) for the duration of the COVID-19 declaration under Section 564(b)(1) of the Act, 21 U.S.C. section 360bbb-3(b)(1), unless the authorization is terminated or revoked sooner. Performed at [facility], [place] [address] [place], [place] [address]     Radiology Reports Dg Chest 2 View  Result Date: 12/24/2018 CLINICAL DATA:  Chest pain EXAM: CHEST - 2 VIEW COMPARISON:  None. FINDINGS: The heart size and mediastinal contours are within normal limits. Both lungs are clear. The visualized skeletal structures are unremarkable. IMPRESSION: No active cardiopulmonary disease. Electronically Signed   By: Ulyses Jarred M.D.   On: 12/24/2018 01:28   Ct Head Wo Contrast  Result Date: 12/26/2018 CLINICAL DATA:  HEADACHE WITH DIZZINESS X2 DAYS, H/O VERTIGO AND MIGRAINESCannot find appropriate structured reason for exam - see REASON FOR EXAM (FREE TEXT) acute headache with neuro deficit EXAM: CT HEAD WITHOUT CONTRAST TECHNIQUE: Contiguous axial images were obtained from the base of the  skull through the vertex  without intravenous contrast. COMPARISON:  None. FINDINGS: Brain: No acute intracranial hemorrhage. No focal mass lesion. No CT evidence of acute infarction. No midline shift or mass effect. No hydrocephalus. Basilar cisterns are patent. Vascular: No hyperdense vessel or unexpected calcification. Skull: Normal. Negative for fracture or focal lesion. Sinuses/Orbits: Paranasal sinuses and mastoid air cells are clear. Orbits are clear. Other: None. IMPRESSION: Normal head CT. Electronically Signed   By: Genevive Bi M.D.   On: 12/26/2018 13:07   Mr Lumbar Spine Wo Contrast  Result Date: 12/26/2018 CLINICAL DATA:  38 year old female with 2 days of back pain, lower extremity heaviness and decreased sensation. Incontinence. "T10 deficit". EXAM: MRI THORACIC WITHOUT AND WITH CONTRAST MRI LUMBAR SPINE WITHOUT CONTRAST TECHNIQUE: Multiplanar and multiecho pulse sequences of the thoracic and lumbar spine were obtained without and with intravenous contrast. CONTRAST:  10 milliliters Gadavist COMPARISON:  Lumbar MRI 09/04/2018.  Total spine CT 09/04/2018. FINDINGS: MRI THORACIC SPINE FINDINGS Limited cervical spine imaging:  Negative. Thoracic spine segmentation:  Appears to be normal. Alignment:  Stable, normal thoracic kyphosis. Vertebrae: No marrow edema or evidence of acute osseous abnormality. No abnormal enhancement identified. Visualized bone marrow signal is within normal limits. Cord: Spinal cord signal is within normal limits at all visualized levels. No abnormal intradural enhancement. The conus appears normal at T12-L1. Paraspinal and other soft tissues: Negative visible chest and upper abdominal viscera. Negative paraspinal soft tissues aside from confluent subcutaneous edema in the lower thoracic and upper lumbar (series 11, image 8). Disc levels: No significant thoracic disc degeneration. There is intermittent thoracic posterior element degeneration, notable at: T1-T2: Mild to moderate facet hypertrophy.   No stenosis. T2-T3: Mild to moderate facet hypertrophy, no stenosis. T4-T5: Mild to moderate right side facet hypertrophy.  No stenosis. T5-T6: Moderate or severe on the right facet hypertrophy including trace degenerative right facet joint fluid (series 12, image 33). No stenosis. T9-T10: Mild to moderate facet hypertrophy.  No stenosis. MRI LUMBAR SPINE FINDINGS Segmentation:  Normal, the same numbering system used in March. Alignment: Chronic grade 2 anterolisthesis of L5 on S1 is stable. Normal lumbar lordosis elsewhere. Vertebrae: Chronic degenerative endplate changes at L5-S1. Chronic L5 pars fractures, better demonstrated on the March CT. No acute osseous abnormality identified. Intact visible sacrum and SI joints. Conus medullaris: Extends to the T12-L1 level and appears normal. Normal cauda quantum nerve roots aside from involvement by epidural lipomatosis in the lower lumbar spine. Paraspinal and other soft tissues: Lower thoracic and lumbar subcutaneous edema superimposed on large body habitus. Negative visible abdominal and pelvic viscera. Disc levels: Stable and negative T12-L1 through L3-L4. L4-L5: Negative disc. Mild bilateral facet hypertrophy. Increased and now moderate epidural lipomatosis (series 19, image 5) resulting in new mild spinal stenosis. No foraminal stenosis. L5-S1: Chronic pars fracture with grade 2 anterolisthesis and severe disc space loss. Stable pseudo disc and moderate posterior element hypertrophy. Chronic severe bilateral L5 foraminal stenosis appears stable. Substantially progressed epidural lipomatosis at this level which now completely effaces CSF from the thecal sac which now has a compressed Trefoil configuration (series 19, image 9). IMPRESSION: 1. Substantially increased lumbar epidural lipomatosis since March at L4-L5 and L5-S1 levels resulting in new spinal stenosis. 2. Otherwise stable MRI appearance of the lumbar spine since 09/04/2018: Chronic L5 pars fractures with  grade 2 spondylolisthesis and severe bilateral foraminal stenosis at L5-S1. 3. The thoracic spine is negative except for intermittent facet arthropathy not resulting in stenosis. No thoracic spinal cord  abnormality identified. Electronically Signed   By: Odessa Fleming M.D.   On: 12/26/2018 16:47   Mr Thoracic Spine W Wo Contrast  Result Date: 12/26/2018 CLINICAL DATA:  38 year old female with 2 days of back pain, lower extremity heaviness and decreased sensation. Incontinence. "T10 deficit". EXAM: MRI THORACIC WITHOUT AND WITH CONTRAST MRI LUMBAR SPINE WITHOUT CONTRAST TECHNIQUE: Multiplanar and multiecho pulse sequences of the thoracic and lumbar spine were obtained without and with intravenous contrast. CONTRAST:  10 milliliters Gadavist COMPARISON:  Lumbar MRI 09/04/2018.  Total spine CT 09/04/2018. FINDINGS: MRI THORACIC SPINE FINDINGS Limited cervical spine imaging:  Negative. Thoracic spine segmentation:  Appears to be normal. Alignment:  Stable, normal thoracic kyphosis. Vertebrae: No marrow edema or evidence of acute osseous abnormality. No abnormal enhancement identified. Visualized bone marrow signal is within normal limits. Cord: Spinal cord signal is within normal limits at all visualized levels. No abnormal intradural enhancement. The conus appears normal at T12-L1. Paraspinal and other soft tissues: Negative visible chest and upper abdominal viscera. Negative paraspinal soft tissues aside from confluent subcutaneous edema in the lower thoracic and upper lumbar (series 11, image 8). Disc levels: No significant thoracic disc degeneration. There is intermittent thoracic posterior element degeneration, notable at: T1-T2: Mild to moderate facet hypertrophy.  No stenosis. T2-T3: Mild to moderate facet hypertrophy, no stenosis. T4-T5: Mild to moderate right side facet hypertrophy.  No stenosis. T5-T6: Moderate or severe on the right facet hypertrophy including trace degenerative right facet joint fluid (series  12, image 33). No stenosis. T9-T10: Mild to moderate facet hypertrophy.  No stenosis. MRI LUMBAR SPINE FINDINGS Segmentation:  Normal, the same numbering system used in March. Alignment: Chronic grade 2 anterolisthesis of L5 on S1 is stable. Normal lumbar lordosis elsewhere. Vertebrae: Chronic degenerative endplate changes at L5-S1. Chronic L5 pars fractures, better demonstrated on the March CT. No acute osseous abnormality identified. Intact visible sacrum and SI joints. Conus medullaris: Extends to the T12-L1 level and appears normal. Normal cauda quantum nerve roots aside from involvement by epidural lipomatosis in the lower lumbar spine. Paraspinal and other soft tissues: Lower thoracic and lumbar subcutaneous edema superimposed on large body habitus. Negative visible abdominal and pelvic viscera. Disc levels: Stable and negative T12-L1 through L3-L4. L4-L5: Negative disc. Mild bilateral facet hypertrophy. Increased and now moderate epidural lipomatosis (series 19, image 5) resulting in new mild spinal stenosis. No foraminal stenosis. L5-S1: Chronic pars fracture with grade 2 anterolisthesis and severe disc space loss. Stable pseudo disc and moderate posterior element hypertrophy. Chronic severe bilateral L5 foraminal stenosis appears stable. Substantially progressed epidural lipomatosis at this level which now completely effaces CSF from the thecal sac which now has a compressed Trefoil configuration (series 19, image 9). IMPRESSION: 1. Substantially increased lumbar epidural lipomatosis since March at L4-L5 and L5-S1 levels resulting in new spinal stenosis. 2. Otherwise stable MRI appearance of the lumbar spine since 09/04/2018: Chronic L5 pars fractures with grade 2 spondylolisthesis and severe bilateral foraminal stenosis at L5-S1. 3. The thoracic spine is negative except for intermittent facet arthropathy not resulting in stenosis. No thoracic spinal cord abnormality identified. Electronically Signed   By:  Odessa Fleming M.D.   On: 12/26/2018 16:47   Dg Chest Port 1 View  Result Date: 12/26/2018 CLINICAL DATA:  Patient presents to ed the via [facility] states she was at work and felt like she was going to faint and lowered herself to the floor. For the last several weeks c/o lower back pain states she has tingling  and numbness in both legs. EXAM: PORTABLE CHEST 1 VIEW COMPARISON:  12/24/18 FINDINGS: The heart size and mediastinal contours are within normal limits. Both lungs are clear. The visualized skeletal structures are unremarkable. IMPRESSION: No active disease. Electronically Signed   By: Elige KoHetal  Patel   On: 12/26/2018 12:06    Time Spent in minutes  25   Mauro Arps M.D on 12/27/2018 at 2:53 PM  Between 7am to 7pm - Pager - [phone]  After 7pm go to [email or web address] - password [email or web address]  [facility] -  Office  [phone]

## 2018-12-27 NOTE — Progress Notes (Signed)
  NEUROSURGERY PROGRESS NOTE   No issues overnight. Pain is manageable when lying in bed.  EXAM:  BP (!) 142/105 (BP Location: Left Arm)   Pulse 87   Temp 97.8 F (36.6 C) (Oral)   Resp 17   Ht 5\' 4"  (1.626 m)   Wt 127.9 kg   LMP 12/13/2018   SpO2 96%   BMI 48.40 kg/m   Awake, alert, oriented  Speech fluent, appropriate  CN grossly intact  Pain limited BLE weakness  IMAGING: MRI T spin normal, MRI L spine shows grade 2 spondy at L5-S1 with no central stenosis but significant bilateral foraminal stenosis.  IMPRESSION:  38 y.o. female with severe back pain limiting ambulation with multiple prior ED visits for the same. She has attempted reasonable outpatient conservative treatment including a few weeks of improvement with ESI but has had continued progression of pain. Surgical stabilization would therefore be indicated.  PLAN: - Will plan on L5-S1 PLIF tomorrow afternoon pending medical clearance. - NPO p MN  I have reviewed the imaging findings with the patient. We discussed the option for continued conservative mgmt with pain control and outpatient f/u. However, given her previous treatments and multiple ED visits I did offer surgery. Risks of surgery were reviewed in detail including nerve injury leading to leg/foot/bladder dysfunction, bleeding, infection, and CSF leak. General risks of stroke, heart attack, and blood clots were also discussed. We reviewed the expected postop course and recovery. In addition, I told her that there is the unlikely possibility of continued or worsening pain. There is also the risk of accelerated adjacent level disease potentially requiring additional surgery in the future. She appeared to understand our discussion and all her questions today were answered. She indicated willingness to proceed.

## 2018-12-27 NOTE — Progress Notes (Signed)
Pt asking for her Carolyn Castro to come and pray for her an hour before her surgery that is set for tomorrow. Per Unit assistant Director pt. notified that due to current restrictions that visitors are not allowed but can be done via video call. Pt verbalizes understanding.

## 2018-12-27 NOTE — Progress Notes (Signed)
  Echocardiogram 2D Echocardiogram has been performed.  [place] Deputy Arneisha Kincannon 12/27/2018, 10:29 AM

## 2018-12-28 ENCOUNTER — Inpatient Hospital Stay (HOSPITAL_COMMUNITY): Payer: 59

## 2018-12-28 ENCOUNTER — Inpatient Hospital Stay (HOSPITAL_COMMUNITY): Payer: 59 | Admitting: Certified Registered"

## 2018-12-28 ENCOUNTER — Encounter (HOSPITAL_COMMUNITY)
Admission: EM | Disposition: A | Discharge status: Home / Self Care | Payer: Self-pay | Source: Home / Self Care | Attending: Internal Medicine

## 2018-12-28 ENCOUNTER — Encounter (HOSPITAL_COMMUNITY): Payer: Self-pay | Admitting: *Deleted

## 2018-12-28 LAB — ABO/RH: ABO/RH(D): A NEG

## 2018-12-28 LAB — TYPE AND SCREEN
ABO/RH(D): A NEG
Antibody Screen: NEGATIVE

## 2018-12-28 LAB — MRSA PCR SCREENING: MRSA by PCR: NEGATIVE

## 2018-12-28 SURGERY — POSTERIOR LUMBAR FUSION 1 LEVEL
Anesthesia: General | Site: Spine Lumbar

## 2018-12-28 MED ORDER — SCOPOLAMINE 1 MG/3DAYS TD PT72
1.0000 | MEDICATED_PATCH | Freq: Once | TRANSDERMAL | Status: DC
  Administered 2018-12-28: 1.5 mg via TRANSDERMAL
  Filled 2018-12-28: qty 1

## 2018-12-28 MED ORDER — KETAMINE HCL 50 MG/5ML IJ SOSY
PREFILLED_SYRINGE | INTRAMUSCULAR | Status: AC
  Filled 2018-12-28: qty 5

## 2018-12-28 MED ORDER — LIDOCAINE 2% (20 MG/ML) 5 ML SYRINGE
INTRAMUSCULAR | Status: DC | PRN
  Administered 2018-12-28: 100 mg via INTRAVENOUS

## 2018-12-28 MED ORDER — FENTANYL CITRATE (PF) 250 MCG/5ML IJ SOLN
INTRAMUSCULAR | Status: AC
  Filled 2018-12-28: qty 5

## 2018-12-28 MED ORDER — KETAMINE HCL 10 MG/ML IJ SOLN
INTRAMUSCULAR | Status: DC | PRN
  Administered 2018-12-28: 20 mg via INTRAVENOUS
  Administered 2018-12-28: 10 mg via INTRAVENOUS
  Administered 2018-12-28: 20 mg via INTRAVENOUS

## 2018-12-28 MED ORDER — SUFENTANIL CITRATE 50 MCG/ML IV SOLN
INTRAVENOUS | Status: DC | PRN
  Administered 2018-12-28 (×7): 5 ug via INTRAVENOUS

## 2018-12-28 MED ORDER — MIDAZOLAM HCL 2 MG/2ML IJ SOLN: 0.5000 mg | Freq: Once | INTRAMUSCULAR | Status: DC | PRN

## 2018-12-28 MED ORDER — DEXTROSE 5 % IV SOLN
INTRAVENOUS | Status: DC | PRN
  Administered 2018-12-28: 3 g via INTRAVENOUS

## 2018-12-28 MED ORDER — PROPOFOL 10 MG/ML IV BOLUS
INTRAVENOUS | Status: AC
  Filled 2018-12-28: qty 20

## 2018-12-28 MED ORDER — LIDOCAINE-EPINEPHRINE 1 %-1:100000 IJ SOLN
INTRAMUSCULAR | Status: AC
  Filled 2018-12-28: qty 1

## 2018-12-28 MED ORDER — LACTATED RINGERS IV SOLN
INTRAVENOUS | Status: DC
  Administered 2018-12-28 (×2): via INTRAVENOUS

## 2018-12-28 MED ORDER — HYDROMORPHONE HCL 1 MG/ML IJ SOLN: 0.2500 mg | INTRAMUSCULAR | Status: DC | PRN

## 2018-12-28 MED ORDER — SODIUM CHLORIDE 0.9 % IV SOLN
INTRAVENOUS | Status: DC | PRN
  Administered 2018-12-28: 500 mL

## 2018-12-28 MED ORDER — ONDANSETRON HCL 4 MG/2ML IJ SOLN
INTRAMUSCULAR | Status: DC | PRN
  Administered 2018-12-28 (×2): 4 mg via INTRAVENOUS

## 2018-12-28 MED ORDER — HYDROCODONE-ACETAMINOPHEN 5-325 MG PO TABS
ORAL_TABLET | ORAL | Status: AC
  Filled 2018-12-28: qty 1

## 2018-12-28 MED ORDER — DEXAMETHASONE SODIUM PHOSPHATE 10 MG/ML IJ SOLN
INTRAMUSCULAR | Status: DC | PRN
  Administered 2018-12-28: 10 mg via INTRAVENOUS

## 2018-12-28 MED ORDER — FENTANYL CITRATE (PF) 100 MCG/2ML IJ SOLN
INTRAMUSCULAR | Status: DC | PRN
  Administered 2018-12-28 (×5): 50 ug via INTRAVENOUS

## 2018-12-28 MED ORDER — ONDANSETRON HCL 4 MG/2ML IJ SOLN
INTRAMUSCULAR | Status: AC
  Filled 2018-12-28: qty 2

## 2018-12-28 MED ORDER — 0.9 % SODIUM CHLORIDE (POUR BTL) OPTIME
TOPICAL | Status: DC | PRN
  Administered 2018-12-28: 1000 mL

## 2018-12-28 MED ORDER — THROMBIN 5000 UNITS EX SOLR
OROMUCOSAL | Status: DC | PRN
  Administered 2018-12-28: 15:00:00 5 mL

## 2018-12-28 MED ORDER — SUGAMMADEX SODIUM 500 MG/5ML IV SOLN
INTRAVENOUS | Status: AC
  Filled 2018-12-28: qty 5

## 2018-12-28 MED ORDER — SUFENTANIL CITRATE 50 MCG/ML IV SOLN
INTRAVENOUS | Status: AC
  Filled 2018-12-28: qty 1

## 2018-12-28 MED ORDER — ALBUTEROL SULFATE HFA 108 (90 BASE) MCG/ACT IN AERS
INHALATION_SPRAY | RESPIRATORY_TRACT | Status: DC | PRN
  Administered 2018-12-28: 2 via RESPIRATORY_TRACT
  Administered 2018-12-28: 6 via RESPIRATORY_TRACT

## 2018-12-28 MED ORDER — PROPOFOL 10 MG/ML IV BOLUS
INTRAVENOUS | Status: DC | PRN
  Administered 2018-12-28: 200 mg via INTRAVENOUS

## 2018-12-28 MED ORDER — LIDOCAINE-EPINEPHRINE 1 %-1:100000 IJ SOLN
INTRAMUSCULAR | Status: DC | PRN
  Administered 2018-12-28: 10 mL

## 2018-12-28 MED ORDER — ACETAMINOPHEN 500 MG PO TABS
1000.0000 mg | ORAL_TABLET | Freq: Four times a day (QID) | ORAL | Status: AC
  Administered 2018-12-28 – 2018-12-29 (×3): 1000 mg via ORAL
  Filled 2018-12-28 (×3): qty 2

## 2018-12-28 MED ORDER — THROMBIN 5000 UNITS EX SOLR
CUTANEOUS | Status: AC
  Filled 2018-12-28: qty 5000

## 2018-12-28 MED ORDER — PROMETHAZINE HCL 25 MG/ML IJ SOLN: 6.2500 mg | INTRAMUSCULAR | Status: DC | PRN

## 2018-12-28 MED ORDER — SUGAMMADEX SODIUM 200 MG/2ML IV SOLN
INTRAVENOUS | Status: DC | PRN
  Administered 2018-12-28: 300 mg via INTRAVENOUS

## 2018-12-28 MED ORDER — MIDAZOLAM HCL 5 MG/5ML IJ SOLN
INTRAMUSCULAR | Status: DC | PRN
  Administered 2018-12-28: 2 mg via INTRAVENOUS

## 2018-12-28 MED ORDER — CEFAZOLIN SODIUM-DEXTROSE 2-4 GM/100ML-% IV SOLN
2.0000 g | Freq: Three times a day (TID) | INTRAVENOUS | Status: AC
  Administered 2018-12-28 – 2018-12-29 (×2): 2 g via INTRAVENOUS
  Filled 2018-12-28 (×4): qty 100

## 2018-12-28 MED ORDER — BUPIVACAINE HCL (PF) 0.5 % IJ SOLN
INTRAMUSCULAR | Status: AC
  Filled 2018-12-28: qty 30

## 2018-12-28 MED ORDER — CEFAZOLIN SODIUM 1 G IJ SOLR
INTRAMUSCULAR | Status: AC
  Filled 2018-12-28: qty 30

## 2018-12-28 MED ORDER — MEPERIDINE HCL 25 MG/ML IJ SOLN: 6.2500 mg | INTRAMUSCULAR | Status: DC | PRN

## 2018-12-28 MED ORDER — MIDAZOLAM HCL 2 MG/2ML IJ SOLN
INTRAMUSCULAR | Status: AC
  Filled 2018-12-28: qty 2

## 2018-12-28 MED ORDER — ROCURONIUM BROMIDE 10 MG/ML (PF) SYRINGE
PREFILLED_SYRINGE | INTRAVENOUS | Status: AC
  Filled 2018-12-28: qty 10

## 2018-12-28 MED ORDER — DEXMEDETOMIDINE HCL 200 MCG/2ML IV SOLN
INTRAVENOUS | Status: DC | PRN
  Administered 2018-12-28: 4 ug via INTRAVENOUS
  Administered 2018-12-28 (×2): 8 ug via INTRAVENOUS

## 2018-12-28 MED ORDER — ROCURONIUM BROMIDE 100 MG/10ML IV SOLN
INTRAVENOUS | Status: DC | PRN
  Administered 2018-12-28: 70 mg via INTRAVENOUS
  Administered 2018-12-28: 20 mg via INTRAVENOUS
  Administered 2018-12-28: 30 mg via INTRAVENOUS

## 2018-12-28 MED ORDER — BUPIVACAINE HCL (PF) 0.5 % IJ SOLN
INTRAMUSCULAR | Status: DC | PRN
  Administered 2018-12-28: 10 mL

## 2018-12-28 MED ORDER — ACETAMINOPHEN 10 MG/ML IV SOLN
INTRAVENOUS | Status: DC | PRN
  Administered 2018-12-28: 1000 mg via INTRAVENOUS

## 2018-12-28 MED ORDER — ACETAMINOPHEN 10 MG/ML IV SOLN
INTRAVENOUS | Status: AC
  Filled 2018-12-28: qty 100

## 2018-12-28 SURGICAL SUPPLY — 73 items
ADH SKN CLS APL DERMABOND .7 (GAUZE/BANDAGES/DRESSINGS) ×1
APL SKNCLS STERI-STRIP NONHPOA (GAUZE/BANDAGES/DRESSINGS)
BAG DECANTER FOR FLEXI CONT (MISCELLANEOUS) ×2 IMPLANT
BASKET BONE COLLECTION (BASKET) ×2 IMPLANT
BENZOIN TINCTURE PRP APPL 2/3 (GAUZE/BANDAGES/DRESSINGS) IMPLANT
BIT DRILL 3.5 POWEREASE (BIT) ×1 IMPLANT
BLADE CLIPPER SURG (BLADE) IMPLANT
BLADE SURG 11 STRL SS (BLADE) ×2 IMPLANT
BUR MATCHSTICK NEURO 3.0 LAGG (BURR) ×2 IMPLANT
BUR PRECISION FLUTE 5.0 (BURR) ×2 IMPLANT
CANISTER SUCT 3000ML PPV (MISCELLANEOUS) ×2 IMPLANT
CARTRIDGE OIL MAESTRO DRILL (MISCELLANEOUS) ×1 IMPLANT
CONT SPEC 4OZ CLIKSEAL STRL BL (MISCELLANEOUS) ×2 IMPLANT
COVER BACK TABLE 60X90IN (DRAPES) ×2 IMPLANT
COVER WAND RF STERILE (DRAPES) ×2 IMPLANT
DECANTER SPIKE VIAL GLASS SM (MISCELLANEOUS) ×2 IMPLANT
DERMABOND ADVANCED (GAUZE/BANDAGES/DRESSINGS) ×1
DERMABOND ADVANCED .7 DNX12 (GAUZE/BANDAGES/DRESSINGS) ×1 IMPLANT
DEVICE INTERBODY ELEVATE 23X7 (Cage) ×2 IMPLANT
DIFFUSER DRILL AIR PNEUMATIC (MISCELLANEOUS) ×2 IMPLANT
DRAPE C-ARM 42X72 X-RAY (DRAPES) ×2 IMPLANT
DRAPE C-ARMOR (DRAPES) ×2 IMPLANT
DRAPE LAPAROTOMY 100X72X124 (DRAPES) ×2 IMPLANT
DRAPE SURG 17X23 STRL (DRAPES) ×2 IMPLANT
DRSG OPSITE POSTOP 4X8 (GAUZE/BANDAGES/DRESSINGS) ×1 IMPLANT
DURAPREP 26ML APPLICATOR (WOUND CARE) ×2 IMPLANT
ELECT REM PT RETURN 9FT ADLT (ELECTROSURGICAL) ×2
ELECTRODE REM PT RTRN 9FT ADLT (ELECTROSURGICAL) ×1 IMPLANT
GAUZE 4X4 16PLY RFD (DISPOSABLE) IMPLANT
GAUZE SPONGE 4X4 12PLY STRL (GAUZE/BANDAGES/DRESSINGS) IMPLANT
GLOVE BIO SURGEON STRL SZ7.5 (GLOVE) IMPLANT
GLOVE BIOGEL PI IND STRL 7.5 (GLOVE) ×2 IMPLANT
GLOVE BIOGEL PI INDICATOR 7.5 (GLOVE) ×2
GLOVE ECLIPSE 7.0 STRL STRAW (GLOVE) ×4 IMPLANT
GLOVE EXAM NITRILE XL STR (GLOVE) IMPLANT
GOWN STRL REUS W/ TWL LRG LVL3 (GOWN DISPOSABLE) ×4 IMPLANT
GOWN STRL REUS W/ TWL XL LVL3 (GOWN DISPOSABLE) IMPLANT
GOWN STRL REUS W/TWL 2XL LVL3 (GOWN DISPOSABLE) IMPLANT
GOWN STRL REUS W/TWL LRG LVL3 (GOWN DISPOSABLE) ×8
GOWN STRL REUS W/TWL XL LVL3 (GOWN DISPOSABLE)
HEMOSTAT POWDER KIT SURGIFOAM (HEMOSTASIS) ×2 IMPLANT
KIT BASIN OR (CUSTOM PROCEDURE TRAY) ×2 IMPLANT
KIT INFUSE XX SMALL 0.7CC (Orthopedic Implant) ×1 IMPLANT
KIT POSITION SURG JACKSON T1 (MISCELLANEOUS) ×2 IMPLANT
KIT TURNOVER KIT B (KITS) ×2 IMPLANT
MILL MEDIUM DISP (BLADE) ×2 IMPLANT
NDL HYPO 18GX1.5 BLUNT FILL (NEEDLE) IMPLANT
NDL SPNL 18GX3.5 QUINCKE PK (NEEDLE) IMPLANT
NEEDLE HYPO 18GX1.5 BLUNT FILL (NEEDLE) IMPLANT
NEEDLE HYPO 22GX1.5 SAFETY (NEEDLE) ×2 IMPLANT
NEEDLE SPNL 18GX3.5 QUINCKE PK (NEEDLE) IMPLANT
NS IRRIG 1000ML POUR BTL (IV SOLUTION) ×2 IMPLANT
OIL CARTRIDGE MAESTRO DRILL (MISCELLANEOUS) ×2
PACK LAMINECTOMY NEURO (CUSTOM PROCEDURE TRAY) ×2 IMPLANT
PAD ARMBOARD 7.5X6 YLW CONV (MISCELLANEOUS) ×6 IMPLANT
ROD CC 30MM (Rod) ×2 IMPLANT
SCREW 5.5X35MM (Screw) ×4 IMPLANT
SCREW BN 35X5.5XMA NS SPNE (Screw) IMPLANT
SCREW MA THRD TI 5.5X45 (Screw) ×1 IMPLANT
SCREW SET SOLERA (Screw) ×8 IMPLANT
SCREW SET SOLERA TI (Screw) IMPLANT
SCREW SOLERA 6.5X35 (Screw) ×2 IMPLANT
SPONGE LAP 4X18 RFD (DISPOSABLE) IMPLANT
SPONGE SURGIFOAM ABS GEL 100 (HEMOSTASIS) IMPLANT
STRIP CLOSURE SKIN 1/2X4 (GAUZE/BANDAGES/DRESSINGS) IMPLANT
SUT VIC AB 0 CT1 18XCR BRD8 (SUTURE) ×1 IMPLANT
SUT VIC AB 0 CT1 8-18 (SUTURE) ×4
SUT VICRYL 3-0 RB1 18 ABS (SUTURE) ×3 IMPLANT
SYR 3ML LL SCALE MARK (SYRINGE) ×6 IMPLANT
TOWEL GREEN STERILE (TOWEL DISPOSABLE) ×2 IMPLANT
TOWEL GREEN STERILE FF (TOWEL DISPOSABLE) ×2 IMPLANT
TRAY FOLEY MTR SLVR 16FR STAT (SET/KITS/TRAYS/PACK) ×2 IMPLANT
WATER STERILE IRR 1000ML POUR (IV SOLUTION) ×2 IMPLANT

## 2018-12-28 NOTE — Transfer of Care (Signed)
Immediate Anesthesia Transfer of Care Note  Patient: Carolyn Castro  Procedure(s) Performed: LUMBAR FIVE-SACRAL ONE POSTERIOR LUMBAR FUSION 1 LEVEL (N/A Spine Lumbar)  Patient Location: PACU  Anesthesia Type:General  Level of Consciousness: awake, alert  and oriented  Airway & Oxygen Therapy: Patient Spontanous Breathing and Patient connected to face mask oxygen  Post-op Assessment: Report given to RN and Post -op Vital signs reviewed and stable  Post vital signs: Reviewed and stable  Last Vitals:  Vitals Value Taken Time  BP 159/87 12/28/18 1752  Temp 36 C 12/28/18 1750  Pulse 137 12/28/18 1757  Resp 35 12/28/18 1757  SpO2 97 % 12/28/18 1757  Vitals shown include unvalidated device data.  Last Pain:  Vitals:   12/28/18 1750  TempSrc:   PainSc: 9          Complications: No apparent anesthesia complications

## 2018-12-28 NOTE — Op Note (Signed)
NEUROSURGERY OPERATIVE NOTE   PREOP DIAGNOSIS:  1. Lumbago with radiculopathy 2. Spondylolisthesis, grade 2 L5-S1  POSTOP DIAGNOSIS: Same  PROCEDURE: 1. L5 Gill procedure including complete laminectomy with radical facetectomy for decompression of exiting nerve roots, more than would be required for placement of interbody graft 2. Placement of anterior interbody device - [employer] expandable 33mm lordotic graft x2 3. Posterior non-segmental instrumentation using cortical pedicle screws at L5 - S1 4. Interbody arthrodesis, L5-S1 5. Use of locally harvested bone autograft 6. Use of non-structural bone allograft - rhBMP-2  SURGEON: Dr. Consuella Lose, MD  ASSISTANT: Dr. Emelda Brothers, MD  ANESTHESIA: General Endotracheal  EBL: 250cc  SPECIMENS: None  DRAINS: None  COMPLICATIONS: None immediate  CONDITION: Hemodynamically stable to PACU  HISTORY: Carolyn Castro is a 38 y.o. female who presented to the emergency department with acute exacerbation of relatively chronic low back pain.  She presented with pain limited lower extremity weakness preventing her from getting up or walking.  She has been seen on an outpatient basis for low back pain related to a spondylolisthesis with L5 pars defects.  She has previously undergone reasonable medical treatment including an epidural steroid injection which provided several weeks of relief, but return of her pain.  With multiple ER visits because of her low back pain, grade 2 spondylolisthesis, and previous medical treatment, she elected to undergo decompression, stabilization, and fusion during this admission.  The risks and benefits of the surgery were reviewed in detail with the patient.  After all her questions were answered informed consent was obtained and witnessed.  PROCEDURE IN DETAIL: The patient was brought to the operating room via stretcher. After induction of general anesthesia, the patient was positioned on the operative  table in the prone position. All pressure points were meticulously padded. Incision was then marked out and prepped and draped in the usual sterile fashion.  After timeout was conducted, skin was infiltrated with local anesthetic. Skin incision was then made sharply and Bovie electrocautery was used to dissect the subcutaneous tissue until the lumbodorsal fascia was identified and incised. The muscle was then elevated in the subperiosteal plane and the L5 lamina and L5-S1 facet complexes were identified. Self-retaining retractors were then placed. Lateral fluoroscopy was taken with a dissector in the L4-5 and L5-S1 interspace, delimiting the L5 lamina, to confirm our location.  At this point attention was turned to decompression. Complete  L5 laminectomy was completed with a high-speed drill and Kerrison punches.    Once central laminectomy was completed, the inferior articulating processes bilaterally were removed easily due to the pars defect.  There was a significant amount of epidural lipomatosis which did in fact seem to be compressing the thecal sac.  Normal dura was identified.    The S1 pedicle was identified, and the superior articulating process of S1 was partially removed in order to identify the L5-S1 disc space.  The grade 2 spondylolisthesis at L5-S1 was clearly visible.  The L5 pedicle was then identified, and the L5 nerve root was identified coursing just underneath the pedicle.  In this way, the L5 nerve root was traced out into the foramen and unroofed for good decompression.  At this point I was able to freely pass a ball-tipped dissector out the L5-S1 foramen bilaterally, as well as at the S1 foramen bilaterally.  Disc space was then identified and incised bilaterally.  Initially, in order to enter the significantly collapsed disc space multiple osteotomes were used.  Ultimately, #6 and #7  distractors and shavers were inserted into the disc space.  Complete discectomy was then completed  using a combination of curettes and rongeurs.  Endplates were then prepared.  Bone harvested during the decompression was mixed with BMP and packed into the interspace.  Expandable 7 mm lordotic cages were then selected and tapped into place.  These were expanded maximally to 11 mm.  Position was then checked with lateral fluoroscopy.  We did note significant improvement in disc space height and foraminal height.  At this point, the entry points for bilateral L5 and S1 cortical pedicle screws were identified using standard anatomic landmarks and lateral fluoro. Pilot holes were then drilled and tapped to 5.5 x 35 mm at L5 and 5.5 x 35 mm at S1. Screws were then placed in L5 using 5.5 x 35 mm on the right and 5.5 x 45 mm on the left.  S1 screws included 6.5 x 35 mm screws bilaterally. Prebent lordotic rod was then sized and placed into the pedicle screws. Set screws were placed and final tightened. Final AP and lateral fluoroscopic images confirmed good position.  Hemostasis was secured and confirmed with bipolar cautery and morcellized gelfoam with thrombin. The wound was then irrigated with copious amounts of antibiotic saline, then closed in standard fashion using a combination of interrupted 0 and 3-0 Vicryl stitches in the muscular, fascial, and subcutaneous layers. Skin was then closed using standard Dermabond. Sterile dressing was then applied. The patient was then transferred to the stretcher, extubated, and taken to the postanesthesia care unit in stable hemodynamic condition.  At the end of the case all sponge, needle, cottonoid, and instrument counts were correct.

## 2018-12-28 NOTE — Anesthesia Preprocedure Evaluation (Addendum)
Anesthesia Evaluation  Patient identified by MRN, date of birth, ID band Patient awake    Reviewed: Allergy & Precautions, NPO status , Patient's Chart, lab work & pertinent test results  History of Anesthesia Complications Negative for: history of anesthetic complications  Airway Mallampati: III  TM Distance: >3 FB Neck ROM: Full    Dental  (+) Dental Advisory Given   Pulmonary asthma ,  12/26/2018 SARS coronavirus NEG   breath sounds clear to auscultation       Cardiovascular hypertension, Pt. on medications (-) angina Rhythm:Regular Rate:Normal     Neuro/Psych Chronic back pain: narcotics    GI/Hepatic GERD  Medicated and Poorly Controlled,(+)     substance abuse  ,   Endo/Other  Hypothyroidism Morbid obesity  Renal/GU negative Renal ROS     Musculoskeletal  (+) narcotic dependent  Abdominal (+) + obese,   Peds  Hematology negative hematology ROS (+)   Anesthesia Other Findings   Reproductive/Obstetrics OCPs                            Anesthesia Physical Anesthesia Plan  ASA: III  Anesthesia Plan: General   Post-op Pain Management:    Induction: Intravenous  PONV Risk Score and Plan: 4 or greater and Ondansetron, Dexamethasone and Scopolamine patch - Pre-op  Airway Management Planned: Oral ETT  Additional Equipment:   Intra-op Plan:   Post-operative Plan: Extubation in OR  Informed Consent: I have reviewed the patients History and Physical, chart, labs and discussed the procedure including the risks, benefits and alternatives for the proposed anesthesia with the patient or authorized representative who has indicated his/her understanding and acceptance.     Dental advisory given  Plan Discussed with: CRNA and Surgeon  Anesthesia Plan Comments:         Anesthesia Quick Evaluation

## 2018-12-28 NOTE — Progress Notes (Signed)
  NEUROSURGERY PROGRESS NOTE   No issues overnight. Pt has unchanged low-back pain.  EXAM:  BP 138/83 (BP Location: Left Arm)   Pulse 91   Temp 98.4 F (36.9 C) (Oral)   Resp 16   Ht 5\' 4"  (1.626 m)   Wt 127.9 kg   LMP 12/13/2018   SpO2 94%   BMI 48.40 kg/m   Awake, alert, oriented  Speech fluent, appropriate  CN grossly intact  Pain limited BLE weakness  IMPRESSION:  38 y.o. female with grade 2 spondy causing significant back pain unresponsive to reasonable conservative treatment. Has been cleared by medicine.  PLAN: - will proceed with L5-S1 decompression/fusion  I have reviewed the surgery, indications, risks, benefits, and alternatives with the patient. All her questions were answered and consent was obtained.

## 2018-12-28 NOTE — Anesthesia Procedure Notes (Signed)
Procedure Name: Intubation Date/Time: 12/28/2018 2:06 PM Performed by: Gwyndolyn Saxon, CRNA Pre-anesthesia Checklist: Patient identified, Emergency Drugs available, Suction available and Patient being monitored Patient Re-evaluated:Patient Re-evaluated prior to induction Oxygen Delivery Method: Circle system utilized Preoxygenation: Pre-oxygenation with 100% oxygen Induction Type: IV induction Ventilation: Mask ventilation without difficulty Laryngoscope Size: Miller and 2 Grade View: Grade I Tube type: Oral Tube size: 7.0 mm Number of attempts: 1 Airway Equipment and Method: Patient positioned with wedge pillow and Stylet Placement Confirmation: ETT inserted through vocal cords under direct vision,  positive ETCO2 and breath sounds checked- equal and bilateral Secured at: 21 cm Tube secured with: Tape Dental Injury: Teeth and Oropharynx as per pre-operative assessment

## 2018-12-28 NOTE — Progress Notes (Signed)
PROGRESS NOTE                                                                                                                                                                                                             Patient Demographics:    Carolyn Castro, is a 38 y.o. female, DOB - May 11, 1981, VQM:[id number]  Admit date - 12/26/2018   Admitting Physician Louellen Molder, MD  Outpatient Primary MD for the patient is Maurice Small, MD  LOS - 2  Outpatient Specialists: Pain clinic (Dr. Maryjean Ka)  Chief Complaint  Patient presents with   Near Syncope       Brief Narrative 38 year old morbidly obese female with history of asthma, allergies, chronic depression, hypothyroidism L5 and S1 neuroforaminal narrowing found in March this year after she presented with fall and low back pain being managed in the pain clinic as outpatient presented to the ED with progressive low back pain for past several days and 3-4 days of intermittent bowel and urinary incontinence.  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 Admitted for pain management and surgical intervention.   Subjective:   Still having off-and-on pain worsened on trying to move around.  Controlled on current regimen.  No overnight events.   Assessment  & Plan :    Principal Problem:   Acute paraplegia (HCC) Secondary to L5-S1 foraminal stenosis.  Pain control with Vicodin and PRN morphine.  Seen by neurosurgery and given her progressive pain plan on L5-S1 posterior lumbar interbody fusion (PLIF) this afternoon.  N.p.o. Discontinue Decadron after surgery.  PT eval in a.m.  Active Problems: Hypothyroidism Continue Synthroid.  TSH normal.  Near syncope Reported having dizziness and being off balance last several days.  No loss of consciousness.  EKG unremarkable.  Stable on telemetry.  TSH normal.  2D echo  with normal EF and no wall motion abnormality.  DC telemetry.    Obesity, Class III, BMI 40-49.9 (morbid obesity) ([place]) Needs counseling on weight loss in excess.  ?  Obstructive sleep apnea As per her pulmonary during her visit yesterday and planned on split-night sleep study for screening.  History of asthma with allergies Resume home inhaler and Singulair.  Chronic depression Continue Paxil and bedtime trazodone   Code Status : Full code  Family Communication  : None  at bedside.  Will update her father after surgery.  Disposition Plan  : Home pending postop course  Barriers For Discharge : Active symptoms  Consults  : Neurosurgery  Procedures  : MRI thoracic and lumbar spine, 2D echo  DVT Prophylaxis  :  Lovenox -  Lab Results  Component Value Date   PLT 410 (H) 12/26/2018    Antibiotics  :    Anti-infectives (From admission, onward)   None        Objective:   Vitals:   12/28/18 0310 12/28/18 0735 12/28/18 0815 12/28/18 1140  BP: (!) 131/98 (!) 148/95  138/83  Pulse: (!) 101 83  91  Resp: 15 16  16   Temp: (!) 97.5 F (36.4 C) 97.6 F (36.4 C)  98.4 F (36.9 C)  TempSrc: Oral Oral  Oral  SpO2: 97% 96% 96% 94%  Weight:      Height:        Wt Readings from Last 3 Encounters:  12/26/18 127.9 kg  12/25/18 129.3 kg  12/24/18 118 kg     Intake/Output Summary (Last 24 hours) at 12/28/2018 1154 Last data filed at 12/28/2018 [id number] Gross per 24 hour  Intake 1626 ml  Output 1700 ml  Net -74 ml    Physical exam Not in distress HEENT: Moist with, supple neck Chest: Clear bilaterally CVs: Normal S1-S2, no murmurs GI: Soft, nondistended, nontender Musculoskeletal: Warm, no edema CNs: Alert and oriented, increased bilateral knee reflexes with diminished lower extremity sensation (unchanged from yesterday)      Data Review:    CBC Recent Labs  Lab 12/24/18 0041 12/26/18 1300  WBC 10.8* 12.2*  HGB 12.8 12.7  HCT 39.7 38.9  PLT 399 410*    MCV 90.6 89.2  MCH 29.2 29.1  MCHC 32.2 32.6  RDW 13.2 13.2    Chemistries  Recent Labs  Lab 12/24/18 0041 12/26/18 1300  NA 137 137  K 4.0 3.6  CL 106 103  CO2 17* 25  GLUCOSE 172* 77  BUN 17 14  CREATININE 1.05* 1.05*  CALCIUM 9.2 8.6*  AST 20 19  ALT 24 26  ALKPHOS 60 55  BILITOT 0.3 0.4   ------------------------------------------------------------------------------------------------------------------ No results for input(s): CHOL, HDL, LDLCALC, TRIG, CHOLHDL, LDLDIRECT in the last 72 hours.  No results found for: HGBA1C ------------------------------------------------------------------------------------------------------------------ Recent Labs    12/27/18 1522  TSH 0.839   ------------------------------------------------------------------------------------------------------------------ No results for input(s): VITAMINB12, FOLATE, FERRITIN, TIBC, IRON, RETICCTPCT in the last 72 hours.  Coagulation profile No results for input(s): INR, PROTIME in the last 168 hours.  No results for input(s): DDIMER in the last 72 hours.  Cardiac Enzymes Recent Labs  Lab 12/24/18 0041  TROPONINI <0.03   ------------------------------------------------------------------------------------------------------------------ No results found for: BNP  Inpatient Medications  Scheduled Meds:  budesonide  1 mg Nebulization BID   dexamethasone  4 mg Oral Q8H   enoxaparin (LOVENOX) injection  40 mg Subcutaneous Q24H   fluticasone  1 spray Each Nare Daily   levothyroxine  75 mcg Oral QAC breakfast   loratadine  10 mg Oral Daily   mometasone-formoterol  2 puff Inhalation BID   montelukast  10 mg Oral QPM   omeprazole  20 mg Oral BID AC   PARoxetine  60 mg Oral Daily   traZODone  50 mg Oral QHS   Continuous Infusions: PRN Meds:.acetaminophen **OR** acetaminophen, albuterol, ALPRAZolam, HYDROcodone-acetaminophen, HYDROmorphone (DILAUDID) injection, ondansetron **OR**  ondansetron (ZOFRAN) IV  Micro Results Recent Results (from the past 240 hour(s))  SARS Coronavirus 2 (CEPHEID - Performed in [facility] hospital lab), Hosp Order     Status: None   Collection Time: 12/26/18  1:00 PM   Specimen: Nasopharyngeal Swab  Result Value Ref Range Status   SARS Coronavirus 2 NEGATIVE NEGATIVE Final    Comment: (NOTE) If result is NEGATIVE SARS-CoV-2 target nucleic acids are NOT DETECTED. The SARS-CoV-2 RNA is generally detectable in upper and lower  respiratory specimens during the acute phase of infection. The lowest  concentration of SARS-CoV-2 viral copies this assay can detect is 250  copies / mL. A negative result does not preclude SARS-CoV-2 infection  and should not be used as the sole basis for treatment or other  patient management decisions.  A negative result may occur with  improper specimen collection / handling, submission of specimen other  than nasopharyngeal swab, presence of viral mutation(s) within the  areas targeted by this assay, and inadequate number of viral copies  (<250 copies / mL). A negative result must be combined with clinical  observations, patient history, and epidemiological information. If result is POSITIVE SARS-CoV-2 target nucleic acids are DETECTED. The SARS-CoV-2 RNA is generally detectable in upper and lower  respiratory specimens dur ing the acute phase of infection.  Positive  results are indicative of active infection with SARS-CoV-2.  Clinical  correlation with patient history and other diagnostic information is  necessary to determine patient infection status.  Positive results do  not rule out bacterial infection or co-infection with other viruses. If result is PRESUMPTIVE POSTIVE SARS-CoV-2 nucleic acids MAY BE PRESENT.   A presumptive positive result was obtained on the submitted specimen  and confirmed on repeat testing.  While 2019 novel coronavirus  (SARS-CoV-2) nucleic acids may be present in the  submitted sample  additional confirmatory testing may be necessary for epidemiological  and / or clinical management purposes  to differentiate between  SARS-CoV-2 and other Sarbecovirus currently known to infect humans.  If clinically indicated additional testing with an alternate test  methodology [phone]) is advised. The SARS-CoV-2 RNA is generally  detectable in upper and lower respiratory sp ecimens during the acute  phase of infection. The expected result is Negative. Fact Sheet for Patients:  BoilerBrush.com.cy Fact Sheet for Healthcare Providers: https://pope.com/ This test is not yet approved or cleared by the [place] FDA and has been authorized for detection and/or diagnosis of SARS-CoV-2 by FDA under an Emergency Use Authorization (EUA).  This EUA will remain in effect (meaning this test can be used) for the duration of the COVID-19 declaration under Section 564(b)(1) of the Act, 21 U.S.C. section 360bbb-3(b)(1), unless the authorization is terminated or revoked sooner. Performed at [facility], [address], [place], [place] [address]     Radiology Reports Dg Chest 2 View  Result Date: 12/24/2018 CLINICAL DATA:  Chest pain EXAM: CHEST - 2 VIEW COMPARISON:  None. FINDINGS: The heart size and mediastinal contours are within normal limits. Both lungs are clear. The visualized skeletal structures are unremarkable. IMPRESSION: No active cardiopulmonary disease. Electronically Signed   By: Deatra Robinson M.D.   On: 12/24/2018 01:28   Ct Head Wo Contrast  Result Date: 12/26/2018 CLINICAL DATA:  HEADACHE WITH DIZZINESS X2 DAYS, H/O VERTIGO AND MIGRAINESCannot find appropriate structured reason for exam - see REASON FOR EXAM (FREE TEXT) acute headache with neuro deficit EXAM: CT HEAD WITHOUT CONTRAST TECHNIQUE: Contiguous axial images were obtained from the base of the skull through the vertex without intravenous  contrast. COMPARISON:  None. FINDINGS: Brain: No acute intracranial hemorrhage. No focal mass lesion. No CT evidence of acute infarction. No midline shift or mass effect. No hydrocephalus. Basilar cisterns are patent. Vascular: No hyperdense vessel or unexpected calcification. Skull: Normal. Negative for fracture or focal lesion. Sinuses/Orbits: Paranasal sinuses and mastoid air cells are clear. Orbits are clear. Other: None. IMPRESSION: Normal head CT. Electronically Signed   By: Genevive Bi M.D.   On: 12/26/2018 13:07   Mr Lumbar Spine Wo Contrast  Result Date: 12/26/2018 CLINICAL DATA:  38 year old female with 2 days of back pain, lower extremity heaviness and decreased sensation. Incontinence. "T10 deficit". EXAM: MRI THORACIC WITHOUT AND WITH CONTRAST MRI LUMBAR SPINE WITHOUT CONTRAST TECHNIQUE: Multiplanar and multiecho pulse sequences of the thoracic and lumbar spine were obtained without and with intravenous contrast. CONTRAST:  10 milliliters Gadavist COMPARISON:  Lumbar MRI 09/04/2018.  Total spine CT 09/04/2018. FINDINGS: MRI THORACIC SPINE FINDINGS Limited cervical spine imaging:  Negative. Thoracic spine segmentation:  Appears to be normal. Alignment:  Stable, normal thoracic kyphosis. Vertebrae: No marrow edema or evidence of acute osseous abnormality. No abnormal enhancement identified. Visualized bone marrow signal is within normal limits. Cord: Spinal cord signal is within normal limits at all visualized levels. No abnormal intradural enhancement. The conus appears normal at T12-L1. Paraspinal and other soft tissues: Negative visible chest and upper abdominal viscera. Negative paraspinal soft tissues aside from confluent subcutaneous edema in the lower thoracic and upper lumbar (series 11, image 8). Disc levels: No significant thoracic disc degeneration. There is intermittent thoracic posterior element degeneration, notable at: T1-T2: Mild to moderate facet hypertrophy.  No stenosis. T2-T3:  Mild to moderate facet hypertrophy, no stenosis. T4-T5: Mild to moderate right side facet hypertrophy.  No stenosis. T5-T6: Moderate or severe on the right facet hypertrophy including trace degenerative right facet joint fluid (series 12, image 33). No stenosis. T9-T10: Mild to moderate facet hypertrophy.  No stenosis. MRI LUMBAR SPINE FINDINGS Segmentation:  Normal, the same numbering system used in March. Alignment: Chronic grade 2 anterolisthesis of L5 on S1 is stable. Normal lumbar lordosis elsewhere. Vertebrae: Chronic degenerative endplate changes at L5-S1. Chronic L5 pars fractures, better demonstrated on the March CT. No acute osseous abnormality identified. Intact visible sacrum and SI joints. Conus medullaris: Extends to the T12-L1 level and appears normal. Normal cauda quantum nerve roots aside from involvement by epidural lipomatosis in the lower lumbar spine. Paraspinal and other soft tissues: Lower thoracic and lumbar subcutaneous edema superimposed on large body habitus. Negative visible abdominal and pelvic viscera. Disc levels: Stable and negative T12-L1 through L3-L4. L4-L5: Negative disc. Mild bilateral facet hypertrophy. Increased and now moderate epidural lipomatosis (series 19, image 5) resulting in new mild spinal stenosis. No foraminal stenosis. L5-S1: Chronic pars fracture with grade 2 anterolisthesis and severe disc space loss. Stable pseudo disc and moderate posterior element hypertrophy. Chronic severe bilateral L5 foraminal stenosis appears stable. Substantially progressed epidural lipomatosis at this level which now completely effaces CSF from the thecal sac which now has a compressed Trefoil configuration (series 19, image 9). IMPRESSION: 1. Substantially increased lumbar epidural lipomatosis since March at L4-L5 and L5-S1 levels resulting in new spinal stenosis. 2. Otherwise stable MRI appearance of the lumbar spine since 09/04/2018: Chronic L5 pars fractures with grade 2  spondylolisthesis and severe bilateral foraminal stenosis at L5-S1. 3. The thoracic spine is negative except for intermittent facet arthropathy not resulting in stenosis. No thoracic spinal cord abnormality identified. Electronically Signed   By:  Odessa FlemingH  Hall M.D.   On: 12/26/2018 16:47   Mr Thoracic Spine W Wo Contrast  Result Date: 12/26/2018 CLINICAL DATA:  38 year old female with 2 days of back pain, lower extremity heaviness and decreased sensation. Incontinence. "T10 deficit". EXAM: MRI THORACIC WITHOUT AND WITH CONTRAST MRI LUMBAR SPINE WITHOUT CONTRAST TECHNIQUE: Multiplanar and multiecho pulse sequences of the thoracic and lumbar spine were obtained without and with intravenous contrast. CONTRAST:  10 milliliters Gadavist COMPARISON:  Lumbar MRI 09/04/2018.  Total spine CT 09/04/2018. FINDINGS: MRI THORACIC SPINE FINDINGS Limited cervical spine imaging:  Negative. Thoracic spine segmentation:  Appears to be normal. Alignment:  Stable, normal thoracic kyphosis. Vertebrae: No marrow edema or evidence of acute osseous abnormality. No abnormal enhancement identified. Visualized bone marrow signal is within normal limits. Cord: Spinal cord signal is within normal limits at all visualized levels. No abnormal intradural enhancement. The conus appears normal at T12-L1. Paraspinal and other soft tissues: Negative visible chest and upper abdominal viscera. Negative paraspinal soft tissues aside from confluent subcutaneous edema in the lower thoracic and upper lumbar (series 11, image 8). Disc levels: No significant thoracic disc degeneration. There is intermittent thoracic posterior element degeneration, notable at: T1-T2: Mild to moderate facet hypertrophy.  No stenosis. T2-T3: Mild to moderate facet hypertrophy, no stenosis. T4-T5: Mild to moderate right side facet hypertrophy.  No stenosis. T5-T6: Moderate or severe on the right facet hypertrophy including trace degenerative right facet joint fluid (series 12,  image 33). No stenosis. T9-T10: Mild to moderate facet hypertrophy.  No stenosis. MRI LUMBAR SPINE FINDINGS Segmentation:  Normal, the same numbering system used in March. Alignment: Chronic grade 2 anterolisthesis of L5 on S1 is stable. Normal lumbar lordosis elsewhere. Vertebrae: Chronic degenerative endplate changes at L5-S1. Chronic L5 pars fractures, better demonstrated on the March CT. No acute osseous abnormality identified. Intact visible sacrum and SI joints. Conus medullaris: Extends to the T12-L1 level and appears normal. Normal cauda quantum nerve roots aside from involvement by epidural lipomatosis in the lower lumbar spine. Paraspinal and other soft tissues: Lower thoracic and lumbar subcutaneous edema superimposed on large body habitus. Negative visible abdominal and pelvic viscera. Disc levels: Stable and negative T12-L1 through L3-L4. L4-L5: Negative disc. Mild bilateral facet hypertrophy. Increased and now moderate epidural lipomatosis (series 19, image 5) resulting in new mild spinal stenosis. No foraminal stenosis. L5-S1: Chronic pars fracture with grade 2 anterolisthesis and severe disc space loss. Stable pseudo disc and moderate posterior element hypertrophy. Chronic severe bilateral L5 foraminal stenosis appears stable. Substantially progressed epidural lipomatosis at this level which now completely effaces CSF from the thecal sac which now has a compressed Trefoil configuration (series 19, image 9). IMPRESSION: 1. Substantially increased lumbar epidural lipomatosis since March at L4-L5 and L5-S1 levels resulting in new spinal stenosis. 2. Otherwise stable MRI appearance of the lumbar spine since 09/04/2018: Chronic L5 pars fractures with grade 2 spondylolisthesis and severe bilateral foraminal stenosis at L5-S1. 3. The thoracic spine is negative except for intermittent facet arthropathy not resulting in stenosis. No thoracic spinal cord abnormality identified. Electronically Signed   By: Odessa FlemingH   Hall M.D.   On: 12/26/2018 16:47   Dg Chest Port 1 View  Result Date: 12/26/2018 CLINICAL DATA:  Patient presents to ed the via [facility] states she was at work and felt like she was going to faint and lowered herself to the floor. For the last several weeks c/o lower back pain states she has tingling and numbness in both legs. EXAM: PORTABLE  CHEST 1 VIEW COMPARISON:  12/24/18 FINDINGS: The heart size and mediastinal contours are within normal limits. Both lungs are clear. The visualized skeletal structures are unremarkable. IMPRESSION: No active disease. Electronically Signed   By: Elige KoHetal  Patel   On: 12/26/2018 12:06    Time Spent in minutes  25   Shewanda Sharpe M.D on 12/28/2018 at 11:54 AM  Between 7am to 7pm - Pager - [phone]  After 7pm go to [email or web address] - password [email or web address]  [facility] -  Office  [phone]

## 2018-12-29 DIAGNOSIS — M48061 Spinal stenosis, lumbar region without neurogenic claudication: Secondary | ICD-10-CM

## 2018-12-29 LAB — BASIC METABOLIC PANEL
Anion gap: 12 (ref 5–15)
BUN: 14 mg/dL (ref 6–20)
CO2: 23 mmol/L (ref 22–32)
Calcium: 8.4 mg/dL — ABNORMAL LOW (ref 8.9–10.3)
Chloride: 100 mmol/L (ref 98–111)
Creatinine, Ser: 0.91 mg/dL (ref 0.44–1.00)
GFR calc Af Amer: >60 mL/min (ref >60)
GFR calc non Af Amer: >60 mL/min (ref >60)
Glucose, Bld: 121 mg/dL — ABNORMAL HIGH (ref 70–99)
Potassium: 3.8 mmol/L (ref 3.5–5.1)
Sodium: 135 mmol/L (ref 135–145)

## 2018-12-29 LAB — CBC
HCT: 37.6 % (ref 36.0–46.0)
Hemoglobin: 12 g/dL (ref 12.0–15.0)
MCH: 28.5 pg (ref 26.0–34.0)
MCHC: 31.9 g/dL (ref 30.0–36.0)
MCV: 89.3 fL (ref 80.0–100.0)
Platelets: 417 10*3/uL — ABNORMAL HIGH (ref 150–400)
RBC: 4.21 MIL/uL (ref 3.87–5.11)
RDW: 13.5 % (ref 11.5–15.5)
WBC: 14.7 10*3/uL — ABNORMAL HIGH (ref 4.0–10.5)
nRBC: 0 % (ref 0.0–0.2)

## 2018-12-29 LAB — APTT: aPTT: 28 seconds (ref 24–36)

## 2018-12-29 LAB — PROTIME-INR
INR: 1 (ref 0.8–1.2)
Prothrombin Time: 13.5 seconds (ref 11.4–15.2)

## 2018-12-29 MED ORDER — OXYCODONE HCL 5 MG PO TABS
5.0000 mg | ORAL_TABLET | ORAL | Status: DC | PRN
  Administered 2018-12-29 – 2018-12-30 (×6): 5 mg via ORAL
  Filled 2018-12-29 (×6): qty 1

## 2018-12-29 NOTE — Evaluation (Signed)
Occupational Therapy Evaluation Patient Details Name: Carolyn Castro MRN: [id number] DOB: Apr 14, 1981 Today's Date: 12/29/2018    History of Present Illness 38 y.o. female s/p L5-S1 decompression and fusion. PMH includes: HTN, Asthma, Anxiety, hypothyroid    Clinical Impression   Pt is doing quite well POD #1.  She currently requires mod - max A for LB ADLs, without AE, but using AE, she is able to perform at supervision level.  Pt has AE at home.  She requires supervision for functional transfers.  She lives with her parents, and reports she was mod I PTA.   Anticipate excellent progress.     Follow Up Recommendations  No OT follow up;Supervision - Intermittent    Equipment Recommendations  None recommended by OT    Recommendations for Other Services       Precautions / Restrictions Precautions Precautions: Fall;Back Precaution Booklet Issued: Yes (comment) Precaution Comments: pt able to state 3/3 back precautions and demonstrates good awareness of them  Required Braces or Orthoses: Spinal Brace Spinal Brace: Applied in sitting position Restrictions Weight Bearing Restrictions: No      Mobility Bed Mobility Overal bed mobility: Needs Assistance Bed Mobility: Sidelying to Sit;Rolling;Sit to Sidelying Rolling: Supervision Sidelying to sit: Supervision     Sit to sidelying: Supervision General bed mobility comments: Pt requesting to practice bed mobility as she reports difficulty shifting to middle of bed.  practiced bed mobility without rails.  She initially required cues to log roll   Transfers Overall transfer level: Needs assistance Equipment used: None Transfers: Sit to/from UGI CorporationStand;Stand Pivot Transfers Sit to Stand: Supervision Stand pivot transfers: Supervision            Balance Overall balance assessment: Needs assistance   Sitting balance-Leahy Scale: Good       Standing balance-Leahy Scale: Fair                             ADL  either performed or assessed with clinical judgement   ADL Overall ADL's : Needs assistance/impaired Eating/Feeding: Independent   Grooming: Wash/dry hands;Wash/dry face;Oral care;Brushing hair;Supervision/safety;Standing   Upper Body Bathing: Supervision/ safety;Set up;Sitting   Lower Body Bathing: Maximal assistance;Sit to/from stand Lower Body Bathing Details (indicate cue type and reason): Pt has LH sponge and is able to verbalize how to use  Upper Body Dressing : Set up;Sitting   Lower Body Dressing: Maximal assistance;Sit to/from stand Lower Body Dressing Details (indicate cue type and reason): Pt has sock aid and reacher, and is able to verbalize how to use for LB ADLs  Toilet Transfer: Supervision/safety;Ambulation;Comfort height toilet   Toileting- Clothing Manipulation and Hygiene: Moderate assistance;Sit to/from stand Toileting - Clothing Manipulation Details (indicate cue type and reason): pt with difficulty accessing peri area.  She was provided with toileting aid and instructed how to use      Functional mobility during ADLs: Supervision/safety       Vision         Perception     Praxis      Pertinent Vitals/Pain Pain Assessment: Faces Faces Pain Scale: Hurts little more Pain Location: surgical site Pain Descriptors / Indicators: Aching Pain Intervention(s): Monitored during session     Hand Dominance Right   Extremity/Trunk Assessment Upper Extremity Assessment Upper Extremity Assessment: Overall WFL for tasks assessed   Lower Extremity Assessment Lower Extremity Assessment: Defer to PT evaluation       Communication Communication Communication: No difficulties  Cognition Arousal/Alertness: Awake/alert Behavior During Therapy: WFL for tasks assessed/performed Overall Cognitive Status: Within Functional Limits for tasks assessed                                     General Comments  Pt does not yet have her brace.  Reviewed  brace wear schedule with her     Exercises     Shoulder Instructions      Home Living Family/patient expects to be discharged to:: Private residence Living Arrangements: Parent Available Help at Discharge: Family Type of Home: House Home Access: Stairs to enter Technical brewer of Steps: 4 Entrance Stairs-Rails: Right Home Layout: Multi-level;Able to live on main level with bedroom/bathroom     Bathroom Shower/Tub: Curtain;Walk-in shower   Bathroom Toilet: Standard Bathroom Accessibility: Yes   Home Equipment: Shower seat;Adaptive equipment Adaptive Equipment: Reacher;Sock aid;Long-handled sponge Additional Comments: Pt reports she lives with her mother and father       Prior Functioning/Environment Level of Independence: Independent        Comments: low back pain limiting activity tolerance        OT Problem List: Decreased activity tolerance;Decreased knowledge of use of DME or AE;Decreased knowledge of precautions;Obesity;Pain      OT Treatment/Interventions: Self-care/ADL training;DME and/or AE instruction;Therapeutic activities;Patient/family education    OT Goals(Current goals can be found in the care plan section) Acute Rehab OT Goals Patient Stated Goal: To go home and regain indepenence  OT Goal Formulation: With patient Time For Goal Achievement: 01/12/19 Potential to Achieve Goals: Good ADL Goals Additional ADL Goal #1: Pt will be independent with brace wear  OT Frequency: Min 2X/week   Barriers to D/C:            Co-evaluation              AM-PAC OT "6 Clicks" Daily Activity     Outcome Measure Help from another person eating meals?: None Help from another person taking care of personal grooming?: None Help from another person toileting, which includes using toliet, bedpan, or urinal?: A Lot Help from another person bathing (including washing, rinsing, drying)?: A Lot Help from another person to put on and taking off regular  upper body clothing?: None Help from another person to put on and taking off regular lower body clothing?: A Lot 6 Click Score: 18   End of Session Nurse Communication: Mobility status  Activity Tolerance: Patient tolerated treatment well Patient left: in bed;with call bell/phone within reach  OT Visit Diagnosis: Pain Pain - part of body: (back )                Time: 7846-9629 OT Time Calculation (min): 21 min Charges:  OT General Charges $OT Visit: 1 Visit OT Evaluation $OT Eval Moderate Complexity: 1 Mod  Lucille Passy, OTR/L Acute Rehabilitation Services Pager [phone] Office [phone]   Lucille Passy M 12/29/2018, 5:29 PM

## 2018-12-29 NOTE — Progress Notes (Signed)
Warning on screen that pt has reached her tylenol limit  Informed pharmacy, pharmacist stated to call MD to change pain control orders  MD Dhungel aware  Pt understanding

## 2018-12-29 NOTE — Evaluation (Signed)
Physical Therapy Evaluation Patient Details Name: Carolyn Castro MRN: [id number] DOB: [birth year] Today's Date: 12/29/2018   History of Present Illness  38 y.o. female s/p L5-S1 decompression and fusion. PMH includes: HTN, Asthma, Anxiety, hypothyroid   Clinical Impression  Pt admitted with above diagnosis. Pt currently with functional limitations due to the deficits listed below (see PT Problem List). PTA, pt living with parents, works as a Statisticianpre school teacher independent with mobility. Today ambulating unit and stairs without assistance. Reviewed log roll and precautions. No questions or concerns at this time.  Pt will benefit from skilled PT to increase their independence and safety with mobility to allow discharge to the venue listed below.       Follow Up Recommendations No PT follow up    Equipment Recommendations  None recommended by PT    Recommendations for Other Services       Precautions / Restrictions Precautions Precautions: Fall;Back Required Braces or Orthoses: Spinal Brace Spinal Brace: Applied in sitting position Restrictions Weight Bearing Restrictions: No      Mobility  Bed Mobility Overal bed mobility: Modified Independent             General bed mobility comments: reviewed log roll, good form  Transfers Overall transfer level: Modified independent Equipment used: Rolling walker (2 wheeled)                Ambulation/Gait Ambulation/Gait assistance: Modified independent (Device/Increase time) Gait Distance (Feet): 200 Feet   Gait Pattern/deviations: Step-to pattern Gait velocity: decreased   General Gait Details: mod I with gait, no LOB  Stairs Stairs: Yes   Stair Management: Two rails;Step to pattern      Wheelchair Mobility    Modified Rankin (Stroke Patients Only)       Balance Overall balance assessment: Needs assistance   Sitting balance-Leahy Scale: Good       Standing balance-Leahy Scale: Fair                                Pertinent Vitals/Pain Pain Assessment: Faces Faces Pain Scale: Hurts little more Pain Location: surgical site Pain Descriptors / Indicators: Aching Pain Intervention(s): Limited activity within patient's tolerance    Home Living Family/patient expects to be discharged to:: Private residence Living Arrangements: Parent Available Help at Discharge: Family Type of Home: House Home Access: Stairs to enter Entrance Stairs-Rails: Right Entrance Stairs-Number of Steps: 4 Home Layout: Multi-level;Able to live on main level with bedroom/bathroom Home Equipment: None      Prior Function Level of Independence: Independent         Comments: low back pain limiting activity tolerance     Hand Dominance   Dominant Hand: Right    Extremity/Trunk Assessment   Upper Extremity Assessment Upper Extremity Assessment: Defer to OT evaluation    Lower Extremity Assessment Lower Extremity Assessment: Overall WFL for tasks assessed(weakness 4/5)       Communication   Communication: No difficulties  Cognition   Behavior During Therapy: WFL for tasks assessed/performed Overall Cognitive Status: Within Functional Limits for tasks assessed                                        General Comments      Exercises     Assessment/Plan    PT Assessment Patent does not need any further  PT services  PT Problem List         PT Treatment Interventions      PT Goals (Current goals can be found in the Care Plan section)  Acute Rehab PT Goals Patient Stated Goal: go home asap PT Goal Formulation: With patient    Frequency     Barriers to discharge        Co-evaluation               AM-PAC PT "6 Clicks" Mobility  Outcome Measure Help needed turning from your back to your side while in a flat bed without using bedrails?: None Help needed moving from lying on your back to sitting on the side of a flat bed without using  bedrails?: None Help needed moving to and from a bed to a chair (including a wheelchair)?: None Help needed standing up from a chair using your arms (e.g., wheelchair or bedside chair)?: None Help needed to walk in hospital room?: None Help needed climbing 3-5 steps with a railing? : None 6 Click Score: 24    End of Session Equipment Utilized During Treatment: Gait belt Activity Tolerance: Patient tolerated treatment well Patient left: in bed Nurse Communication: Mobility status PT Visit Diagnosis: Unsteadiness on feet (R26.81)    Time: 6314-9702 PT Time Calculation (min) (ACUTE ONLY): 26 min   Charges:   PT Evaluation $PT Eval Moderate Complexity: 1 Mod PT Treatments $Gait Training: 8-22 mins        Reinaldo Berber, PT, DPT Acute Rehabilitation Services Pager: [phone] Office: [place] 12/29/2018, 2:14 PM

## 2018-12-29 NOTE — Progress Notes (Signed)
Orthopedic Tech Progress Note Patient Details:  Carolyn Castro 1980/10/31 [id number]  Patient ID: [name], female   DOB: 04/30/1981, 38 y.o.   MRN: [id number]   [name] [employer] for Aspen collar. 12/29/2018, 2:48 PM

## 2018-12-29 NOTE — Progress Notes (Signed)
Neurosurgery Service Progress Note  Subjective: No acute events overnight, some back pain, no radicular Sx, ambulated well, tolerating regular diet   Objective: Vitals:   12/29/18 1615 12/29/18 1647 12/29/18 1929 12/29/18 2104  BP: (!) 214/119 129/86 (!) 186/106   Pulse: 97 92 98   Resp: 16  18   Temp:   97.7 F (36.5 C)   TempSrc:   Oral   SpO2: 95%  100% 95%  Weight:      Height:       Temp (24hrs), Avg:98.4 F (36.9 C), Min:97.6 F (36.4 C), Max:100.4 F (38 C)  CBC Latest Ref Rng & Units 12/29/2018 12/26/2018 12/24/2018  WBC 4.0 - 10.5 K/uL 14.7(H) 12.2(H) 10.8(H)  Hemoglobin 12.0 - 15.0 g/dL 12.0 12.7 12.8  Hematocrit 36.0 - 46.0 % 37.6 38.9 39.7  Platelets 150 - 400 K/uL 417(H) 410(H) 399   BMP Latest Ref Rng & Units 12/29/2018 12/26/2018 12/24/2018  Glucose 70 - 99 mg/dL 121(H) 77 172(H)  BUN 6 - 20 mg/dL 14 14 17   Creatinine 0.44 - 1.00 mg/dL 0.91 1.05(H) 1.05(H)  Sodium 135 - 145 mmol/L 135 137 137  Potassium 3.5 - 5.1 mmol/L 3.8 3.6 4.0  Chloride 98 - 111 mmol/L 100 103 106  CO2 22 - 32 mmol/L 23 25 17(L)  Calcium 8.9 - 10.3 mg/dL 8.4(L) 8.6(L) 9.2    Intake/Output Summary (Last 24 hours) at 12/29/2018 2324 Last data filed at 12/29/2018 1700 Gross per 24 hour  Intake 970 ml  Output -  Net 970 ml    Current Facility-Administered Medications:  .  acetaminophen (TYLENOL) tablet 650 mg, 650 mg, Oral, Q6H PRN **OR** acetaminophen (TYLENOL) suppository 650 mg, 650 mg, Rectal, Q6H PRN, Dhungel, Nishant, MD .  albuterol (PROVENTIL) (2.5 MG/3ML) 0.083% nebulizer solution 3 mL, 3 mL, Nebulization, Q6H PRN, Dhungel, Nishant, MD .  ALPRAZolam Duanne Moron) tablet 0.5 mg, 0.5 mg, Oral, TID PRN, Dhungel, Nishant, MD, 0.5 mg at 12/29/18 2119 .  budesonide (PULMICORT) nebulizer solution 1 mg, 1 mg, Nebulization, BID, Dhungel, Nishant, MD, 1 mg at 12/29/18 2102 .  enoxaparin (LOVENOX) injection 40 mg, 40 mg, Subcutaneous, Q24H, Dhungel, Nishant, MD, 40 mg at 12/29/18 2119 .   fluticasone (FLONASE) 50 MCG/ACT nasal spray 1 spray, 1 spray, Each Nare, Daily, Dhungel, Nishant, MD, 1 spray at 12/29/18 1127 .  HYDROmorphone (DILAUDID) injection 1 mg, 1 mg, Intravenous, Q3H PRN, Dhungel, Nishant, MD, 1 mg at 12/29/18 1930 .  lactated ringers infusion, , Intravenous, Continuous, Annye Asa, MD, Last Rate: 10 mL/hr at 12/29/18 1400 .  levothyroxine (SYNTHROID) tablet 75 mcg, 75 mcg, Oral, QAC breakfast, Dhungel, Nishant, MD, 75 mcg at 12/29/18 0520 .  loratadine (CLARITIN) tablet 10 mg, 10 mg, Oral, Daily, Dhungel, Nishant, MD, 10 mg at 12/29/18 0803 .  mometasone-formoterol (DULERA) 200-5 MCG/ACT inhaler 2 puff, 2 puff, Inhalation, BID, Dhungel, Nishant, MD, 2 puff at 12/29/18 2106 .  montelukast (SINGULAIR) tablet 10 mg, 10 mg, Oral, QPM, Dhungel, Nishant, MD, 10 mg at 12/29/18 1617 .  omeprazole (PRILOSEC) capsule 20 mg, 20 mg, Oral, BID AC, Dhungel, Nishant, MD, 20 mg at 12/29/18 1617 .  ondansetron (ZOFRAN) tablet 4 mg, 4 mg, Oral, Q6H PRN **OR** ondansetron (ZOFRAN) injection 4 mg, 4 mg, Intravenous, Q6H PRN, Dhungel, Nishant, MD .  oxyCODONE (Oxy IR/ROXICODONE) immediate release tablet 5 mg, 5 mg, Oral, Q4H PRN, Dhungel, Nishant, MD, 5 mg at 12/29/18 2119 .  PARoxetine (PAXIL) tablet 60 mg, 60 mg, Oral, Daily, Dhungel, Nishant, MD,  60 mg at 12/29/18 0803 .  scopolamine (TRANSDERM-SCOP) 1 MG/3DAYS 1.5 mg, 1 patch, Transdermal, Once, Jairo BenJackson, Carswell, MD, 1.5 mg at 12/28/18 1334 .  traZODone (DESYREL) tablet 50 mg, 50 mg, Oral, QHS, Dhungel, Nishant, MD, 50 mg at 12/29/18 2119   Physical Exam: AOx3, PERRL, EOMI, FS, Strength 5/5 x4, SILTx4, no drift Incision c/d/i  Assessment & Plan: 38 y.o. woman s/p L5-S1 Gill procedure & PLIF, recovering well.  -from a neurosurgical perspective, should be able to be discharged tomorrow -will need to f/u with Dr. Conchita ParisNundkumar in 2 weeks in clinic, she can call [phone] to make her follow up appointment  Carolyn Castro   12/29/18 11:24 PM

## 2018-12-29 NOTE — Progress Notes (Signed)
Lumbar brace delivered at bedside

## 2018-12-29 NOTE — Progress Notes (Signed)
PROGRESS NOTE                                                                                                                                                                                                             Patient Demographics:    Carolyn Castro, is a 38 y.o. female, DOB - [birth year], VZC:[id number]  Admit date - 12/26/2018   Admitting Physician Eddie NorthNishant Tema Alire, MD  Outpatient Primary MD for the patient is [name], Carol, MD  LOS - 3  Outpatient Specialists: Pain clinic (Dr. Ollen BowlHarkins)  Chief Complaint  Patient presents with   Near Syncope       Brief Narrative [age] year old morbidly obese female with history of asthma, allergies, chronic depression, hypothyroidism L5 and S1 neuroforaminal narrowing found in March this year after she presented with fall and low back pain being managed in the pain clinic as outpatient presented to the ED with progressive low back pain for past several days and 3-4 days of intermittent bowel and urinary incontinence.  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 Admitted for pain management and surgical intervention.   Subjective:   Pain much better controlled and patient was  able to ambulate to the bathroom with a walker.  Foley removed and no further urinary incontinence.   Assessment  & Plan :    Principal Problem:   Acute paraplegia (HCC) Secondary to L5-S1 foraminal stenosis.  Patient underwent L5 complete laminectomy with placement of anterior interbody device.  Tolerated well.  Pain much better controlled and able to ambulate with assistance and walker this morning.  Continue PRN oxycodone.  Low-dose IV morphine for severe pain.  No further incontinence.  Discontinue Decadron. PT evaluation.  Further recommendations per neurosurgery.  Active Problems: Hypothyroidism Continue Synthroid.  TSH normal.  Near  syncope Reported having dizziness and being off balance last several days.  No loss of consciousness.  EKG unremarkable.  TSH normal.  2D echo with normal EF and no wall motion abnormality.  Stable on telemetry and now discontinued.    Obesity, Class III, BMI 40-49.9 (morbid obesity) (HCC) Needs counseling on weight loss in excess.  ?  Obstructive sleep apnea As per her pulmonary during her visit yesterday and planned on split-night sleep study for screening.  History of asthma with allergies Resume home inhaler  and Singulair.  Chronic depression Continue Paxil and bedtime trazodone   Code Status : Full code  Family Communication  : None at bedside.  Updated parents on the phone.  Disposition Plan  : Pending PT evaluation, possibly home in the next 24-48 hours.  Barriers For Discharge : Active symptoms  Consults  : Neurosurgery  Procedures  : MRI thoracic and lumbar spine, 2D echo, lumbar laminectomy  DVT Prophylaxis  :  Lovenox -  Lab Results  Component Value Date   PLT 417 (H) 12/29/2018    Antibiotics  :    Anti-infectives (From admission, onward)   Start     Dose/Rate Route Frequency Ordered Stop   12/28/18 1930  ceFAZolin (ANCEF) IVPB 2g/100 mL premix     2 g 200 mL/hr over 30 Minutes Intravenous Every 8 hours 12/28/18 1850 12/29/18 0629   12/28/18 1501  bacitracin 50,000 Units in sodium chloride 0.9 % 500 mL irrigation  Status:  Discontinued       As needed 12/28/18 1501 12/28/18 1749        Objective:   Vitals:   12/29/18 0400 12/29/18 0722 12/29/18 0834 12/29/18 1222  BP: (!) 162/98 (!) 158/107  (!) 185/122  Pulse: 97 (!) 107  (!) 111  Resp: 16   16  Temp: 98 F (36.7 C) (!) 100.4 F (38 C)  98.3 F (36.8 C)  TempSrc: Oral Oral  Oral  SpO2: 93%  95% 98%  Weight:      Height:        Wt Readings from Last 3 Encounters:  12/28/18 127.9 kg  12/25/18 129.3 kg  12/24/18 118 kg     Intake/Output Summary (Last 24 hours) at 12/29/2018 1339 Last  data filed at 12/29/2018 0930 Gross per 24 hour  Intake 1535 ml  Output 405 ml  Net 1130 ml   Physical exam Not in distress, pleasant HEENT: Moist mucosa, supple neck Chest: Clear CVs: Normal S1-S2 GI: Soft, nondistended, nontender musculoskeletal: Warm, no edema, dressing over the back is clean CNS: Alert and oriented, improved strength and sensation over bilateral lower extremity      Data Review:    CBC Recent Labs  Lab 12/24/18 0041 12/26/18 1300 12/29/18 0349  WBC 10.8* 12.2* 14.7*  HGB 12.8 12.7 12.0  HCT 39.7 38.9 37.6  PLT 399 410* 417*  MCV 90.6 89.2 89.3  MCH 29.2 29.1 28.5  MCHC 32.2 32.6 31.9  RDW 13.2 13.2 13.5    Chemistries  Recent Labs  Lab 12/24/18 0041 12/26/18 1300 12/29/18 0349  NA 137 137 135  K 4.0 3.6 3.8  CL 106 103 100  CO2 17* 25 23  GLUCOSE 172* 77 121*  BUN 17 14 14   CREATININE 1.05* 1.05* 0.91  CALCIUM 9.2 8.6* 8.4*  AST 20 19  --   ALT 24 26  --   ALKPHOS 60 55  --   BILITOT 0.3 0.4  --    ------------------------------------------------------------------------------------------------------------------ No results for input(s): CHOL, HDL, LDLCALC, TRIG, CHOLHDL, LDLDIRECT in the last 72 hours.  No results found for: HGBA1C ------------------------------------------------------------------------------------------------------------------ Recent Labs    12/27/18 1522  TSH 0.839   ------------------------------------------------------------------------------------------------------------------ No results for input(s): VITAMINB12, FOLATE, FERRITIN, TIBC, IRON, RETICCTPCT in the last 72 hours.  Coagulation profile Recent Labs  Lab 12/29/18 0349  INR 1.0    No results for input(s): DDIMER in the last 72 hours.  Cardiac Enzymes Recent Labs  Lab 12/24/18 0041  TROPONINI <0.03   ------------------------------------------------------------------------------------------------------------------  No results found for:  BNP  Inpatient Medications  Scheduled Meds:  acetaminophen  1,000 mg Oral Q6H   budesonide  1 mg Nebulization BID   enoxaparin (LOVENOX) injection  40 mg Subcutaneous Q24H   fluticasone  1 spray Each Nare Daily   levothyroxine  75 mcg Oral QAC breakfast   loratadine  10 mg Oral Daily   mometasone-formoterol  2 puff Inhalation BID   montelukast  10 mg Oral QPM   omeprazole  20 mg Oral BID AC   PARoxetine  60 mg Oral Daily   scopolamine  1 patch Transdermal Once   traZODone  50 mg Oral QHS   Continuous Infusions:  lactated ringers 10 mL/hr at 12/28/18 1330   PRN Meds:.acetaminophen **OR** acetaminophen, albuterol, ALPRAZolam, HYDROmorphone (DILAUDID) injection, ondansetron **OR** ondansetron (ZOFRAN) IV, oxyCODONE  Micro Results Recent Results (from the past 240 hour(s))  SARS Coronavirus 2 (CEPHEID - Performed in [facility] hospital lab), Hosp Order     Status: None   Collection Time: 12/26/18  1:00 PM   Specimen: Nasopharyngeal Swab  Result Value Ref Range Status   SARS Coronavirus 2 NEGATIVE NEGATIVE Final    Comment: (NOTE) If result is NEGATIVE SARS-CoV-2 target nucleic acids are NOT DETECTED. The SARS-CoV-2 RNA is generally detectable in upper and lower  respiratory specimens during the acute phase of infection. The lowest  concentration of SARS-CoV-2 viral copies this assay can detect is 250  copies / mL. A negative result does not preclude SARS-CoV-2 infection  and should not be used as the sole basis for treatment or other  patient management decisions.  A negative result may occur with  improper specimen collection / handling, submission of specimen other  than nasopharyngeal swab, presence of viral mutation(s) within the  areas targeted by this assay, and inadequate number of viral copies  (<250 copies / mL). A negative result must be combined with clinical  observations, patient history, and epidemiological information. If result is  POSITIVE SARS-CoV-2 target nucleic acids are DETECTED. The SARS-CoV-2 RNA is generally detectable in upper and lower  respiratory specimens dur ing the acute phase of infection.  Positive  results are indicative of active infection with SARS-CoV-2.  Clinical  correlation with patient history and other diagnostic information is  necessary to determine patient infection status.  Positive results do  not rule out bacterial infection or co-infection with other viruses. If result is PRESUMPTIVE POSTIVE SARS-CoV-2 nucleic acids MAY BE PRESENT.   A presumptive positive result was obtained on the submitted specimen  and confirmed on repeat testing.  While 2019 novel coronavirus  (SARS-CoV-2) nucleic acids may be present in the submitted sample  additional confirmatory testing may be necessary for epidemiological  and / or clinical management purposes  to differentiate between  SARS-CoV-2 and other Sarbecovirus currently known to infect humans.  If clinically indicated additional testing with an alternate test  methodology [phone]) is advised. The SARS-CoV-2 RNA is generally  detectable in upper and lower respiratory sp ecimens during the acute  phase of infection. The expected result is Negative. Fact Sheet for Patients:  BoilerBrush.com.cy Fact Sheet for Healthcare Providers: https://pope.com/ This test is not yet approved or cleared by the [place] FDA and has been authorized for detection and/or diagnosis of SARS-CoV-2 by FDA under an Emergency Use Authorization (EUA).  This EUA will remain in effect (meaning this test can be used) for the duration of the COVID-19 declaration under Section 564(b)(1) of the Act, 21 U.S.C. section  360bbb-3(b)(1), unless the authorization is terminated or revoked sooner. Performed at [facility], [place] [address] [place], [place] [address]   MRSA PCR Screening     Status: None   Collection Time:  12/28/18 12:54 PM   Specimen: Nasopharyngeal  Result Value Ref Range Status   MRSA by PCR NEGATIVE NEGATIVE Final    Comment:        The GeneXpert MRSA Assay (FDA approved for NASAL specimens only), is one component of a comprehensive MRSA colonization surveillance program. It is not intended to diagnose MRSA infection nor to guide or monitor treatment for MRSA infections. Performed at [facility], [place] [address], [place], [place] [address]     Radiology Reports Dg Chest 2 View  Result Date: 12/24/2018 CLINICAL DATA:  Chest pain EXAM: CHEST - 2 VIEW COMPARISON:  None. FINDINGS: The heart size and mediastinal contours are within normal limits. Both lungs are clear. The visualized skeletal structures are unremarkable. IMPRESSION: No active cardiopulmonary disease. Electronically Signed   By: Ulyses Jarred M.D.   On: 12/24/2018 01:28   Dg Lumbar Spine 2-3 Views  Result Date: 12/28/2018 CLINICAL DATA:  L5-S1 PLIF EXAM: LUMBAR SPINE - 2-3 VIEW; DG C-ARM 61-120 MIN COMPARISON:  MRI 12/26/2018 FINDINGS: Two low resolution intraoperative spot views of the lumbar spine. Total fluoroscopy time was 1 minutes 22 seconds. The images demonstrate posterior rod and fixating screws at L5-S1 with placement of interbody device. IMPRESSION: Intraoperative fluoroscopic assistance provided during lumbar spine surgery Electronically Signed   By: Donavan Foil M.D.   On: 12/28/2018 19:55   Ct Head Wo Contrast  Result Date: 12/26/2018 CLINICAL DATA:  HEADACHE WITH DIZZINESS X2 DAYS, H/O VERTIGO AND MIGRAINESCannot find appropriate structured reason for exam - see REASON FOR EXAM (FREE TEXT) acute headache with neuro deficit EXAM: CT HEAD WITHOUT CONTRAST TECHNIQUE: Contiguous axial images were obtained from the base of the skull through the vertex without intravenous contrast. COMPARISON:  None. FINDINGS: Brain: No acute intracranial hemorrhage. No focal mass lesion. No CT evidence of acute infarction. No  midline shift or mass effect. No hydrocephalus. Basilar cisterns are patent. Vascular: No hyperdense vessel or unexpected calcification. Skull: Normal. Negative for fracture or focal lesion. Sinuses/Orbits: Paranasal sinuses and mastoid air cells are clear. Orbits are clear. Other: None. IMPRESSION: Normal head CT. Electronically Signed   By: Suzy Bouchard M.D.   On: 12/26/2018 13:07   Mr Lumbar Spine Wo Contrast  Result Date: 12/26/2018 CLINICAL DATA:  38 year old female with 2 days of back pain, lower extremity heaviness and decreased sensation. Incontinence. "T10 deficit". EXAM: MRI THORACIC WITHOUT AND WITH CONTRAST MRI LUMBAR SPINE WITHOUT CONTRAST TECHNIQUE: Multiplanar and multiecho pulse sequences of the thoracic and lumbar spine were obtained without and with intravenous contrast. CONTRAST:  10 milliliters Gadavist COMPARISON:  Lumbar MRI 09/04/2018.  Total spine CT 09/04/2018. FINDINGS: MRI THORACIC SPINE FINDINGS Limited cervical spine imaging:  Negative. Thoracic spine segmentation:  Appears to be normal. Alignment:  Stable, normal thoracic kyphosis. Vertebrae: No marrow edema or evidence of acute osseous abnormality. No abnormal enhancement identified. Visualized bone marrow signal is within normal limits. Cord: Spinal cord signal is within normal limits at all visualized levels. No abnormal intradural enhancement. The conus appears normal at T12-L1. Paraspinal and other soft tissues: Negative visible chest and upper abdominal viscera. Negative paraspinal soft tissues aside from confluent subcutaneous edema in the lower thoracic and upper lumbar (series 11, image 8). Disc levels: No significant thoracic disc  degeneration. There is intermittent thoracic posterior element degeneration, notable at: T1-T2: Mild to moderate facet hypertrophy.  No stenosis. T2-T3: Mild to moderate facet hypertrophy, no stenosis. T4-T5: Mild to moderate right side facet hypertrophy.  No stenosis. T5-T6: Moderate or  severe on the right facet hypertrophy including trace degenerative right facet joint fluid (series 12, image 33). No stenosis. T9-T10: Mild to moderate facet hypertrophy.  No stenosis. MRI LUMBAR SPINE FINDINGS Segmentation:  Normal, the same numbering system used in March. Alignment: Chronic grade 2 anterolisthesis of L5 on S1 is stable. Normal lumbar lordosis elsewhere. Vertebrae: Chronic degenerative endplate changes at L5-S1. Chronic L5 pars fractures, better demonstrated on the March CT. No acute osseous abnormality identified. Intact visible sacrum and SI joints. Conus medullaris: Extends to the T12-L1 level and appears normal. Normal cauda quantum nerve roots aside from involvement by epidural lipomatosis in the lower lumbar spine. Paraspinal and other soft tissues: Lower thoracic and lumbar subcutaneous edema superimposed on large body habitus. Negative visible abdominal and pelvic viscera. Disc levels: Stable and negative T12-L1 through L3-L4. L4-L5: Negative disc. Mild bilateral facet hypertrophy. Increased and now moderate epidural lipomatosis (series 19, image 5) resulting in new mild spinal stenosis. No foraminal stenosis. L5-S1: Chronic pars fracture with grade 2 anterolisthesis and severe disc space loss. Stable pseudo disc and moderate posterior element hypertrophy. Chronic severe bilateral L5 foraminal stenosis appears stable. Substantially progressed epidural lipomatosis at this level which now completely effaces CSF from the thecal sac which now has a compressed Trefoil configuration (series 19, image 9). IMPRESSION: 1. Substantially increased lumbar epidural lipomatosis since March at L4-L5 and L5-S1 levels resulting in new spinal stenosis. 2. Otherwise stable MRI appearance of the lumbar spine since 09/04/2018: Chronic L5 pars fractures with grade 2 spondylolisthesis and severe bilateral foraminal stenosis at L5-S1. 3. The thoracic spine is negative except for intermittent facet arthropathy not  resulting in stenosis. No thoracic spinal cord abnormality identified. Electronically Signed   By: Odessa FlemingH  Hall M.D.   On: 12/26/2018 16:47   Mr Thoracic Spine W Wo Contrast  Result Date: 12/26/2018 CLINICAL DATA:  38 year old female with 2 days of back pain, lower extremity heaviness and decreased sensation. Incontinence. "T10 deficit". EXAM: MRI THORACIC WITHOUT AND WITH CONTRAST MRI LUMBAR SPINE WITHOUT CONTRAST TECHNIQUE: Multiplanar and multiecho pulse sequences of the thoracic and lumbar spine were obtained without and with intravenous contrast. CONTRAST:  10 milliliters Gadavist COMPARISON:  Lumbar MRI 09/04/2018.  Total spine CT 09/04/2018. FINDINGS: MRI THORACIC SPINE FINDINGS Limited cervical spine imaging:  Negative. Thoracic spine segmentation:  Appears to be normal. Alignment:  Stable, normal thoracic kyphosis. Vertebrae: No marrow edema or evidence of acute osseous abnormality. No abnormal enhancement identified. Visualized bone marrow signal is within normal limits. Cord: Spinal cord signal is within normal limits at all visualized levels. No abnormal intradural enhancement. The conus appears normal at T12-L1. Paraspinal and other soft tissues: Negative visible chest and upper abdominal viscera. Negative paraspinal soft tissues aside from confluent subcutaneous edema in the lower thoracic and upper lumbar (series 11, image 8). Disc levels: No significant thoracic disc degeneration. There is intermittent thoracic posterior element degeneration, notable at: T1-T2: Mild to moderate facet hypertrophy.  No stenosis. T2-T3: Mild to moderate facet hypertrophy, no stenosis. T4-T5: Mild to moderate right side facet hypertrophy.  No stenosis. T5-T6: Moderate or severe on the right facet hypertrophy including trace degenerative right facet joint fluid (series 12, image 33). No stenosis. T9-T10: Mild to moderate facet hypertrophy.  No stenosis.  MRI LUMBAR SPINE FINDINGS Segmentation:  Normal, the same numbering  system used in March. Alignment: Chronic grade 2 anterolisthesis of L5 on S1 is stable. Normal lumbar lordosis elsewhere. Vertebrae: Chronic degenerative endplate changes at L5-S1. Chronic L5 pars fractures, better demonstrated on the March CT. No acute osseous abnormality identified. Intact visible sacrum and SI joints. Conus medullaris: Extends to the T12-L1 level and appears normal. Normal cauda quantum nerve roots aside from involvement by epidural lipomatosis in the lower lumbar spine. Paraspinal and other soft tissues: Lower thoracic and lumbar subcutaneous edema superimposed on large body habitus. Negative visible abdominal and pelvic viscera. Disc levels: Stable and negative T12-L1 through L3-L4. L4-L5: Negative disc. Mild bilateral facet hypertrophy. Increased and now moderate epidural lipomatosis (series 19, image 5) resulting in new mild spinal stenosis. No foraminal stenosis. L5-S1: Chronic pars fracture with grade 2 anterolisthesis and severe disc space loss. Stable pseudo disc and moderate posterior element hypertrophy. Chronic severe bilateral L5 foraminal stenosis appears stable. Substantially progressed epidural lipomatosis at this level which now completely effaces CSF from the thecal sac which now has a compressed Trefoil configuration (series 19, image 9). IMPRESSION: 1. Substantially increased lumbar epidural lipomatosis since March at L4-L5 and L5-S1 levels resulting in new spinal stenosis. 2. Otherwise stable MRI appearance of the lumbar spine since 09/04/2018: Chronic L5 pars fractures with grade 2 spondylolisthesis and severe bilateral foraminal stenosis at L5-S1. 3. The thoracic spine is negative except for intermittent facet arthropathy not resulting in stenosis. No thoracic spinal cord abnormality identified. Electronically Signed   By: Odessa Fleming M.D.   On: 12/26/2018 16:47   Dg Chest Port 1 View  Result Date: 12/26/2018 CLINICAL DATA:  Patient presents to ed the via [facility] states she  was at work and felt like she was going to faint and lowered herself to the floor. For the last several weeks c/o lower back pain states she has tingling and numbness in both legs. EXAM: PORTABLE CHEST 1 VIEW COMPARISON:  12/24/18 FINDINGS: The heart size and mediastinal contours are within normal limits. Both lungs are clear. The visualized skeletal structures are unremarkable. IMPRESSION: No active disease. Electronically Signed   By: Elige Ko   On: 12/26/2018 12:06   Dg C-arm 1-60 Min  Result Date: 12/28/2018 CLINICAL DATA:  L5-S1 PLIF EXAM: LUMBAR SPINE - 2-3 VIEW; DG C-ARM 61-120 MIN COMPARISON:  MRI 12/26/2018 FINDINGS: Two low resolution intraoperative spot views of the lumbar spine. Total fluoroscopy time was 1 minutes 22 seconds. The images demonstrate posterior rod and fixating screws at L5-S1 with placement of interbody device. IMPRESSION: Intraoperative fluoroscopic assistance provided during lumbar spine surgery Electronically Signed   By: Jasmine Pang M.D.   On: 12/28/2018 19:55    Time Spent in minutes  25   Bannon Giammarco M.D on 12/29/2018 at 1:39 PM  Between 7am to 7pm - Pager - [phone]  After 7pm go to [email or web address] - password [facility]  [facility] -  Office  [phone]

## 2018-12-29 NOTE — Progress Notes (Signed)
Overnight, pt pain well controlled with PRN pain meds, pt diet advanced to regular. Foley catheter removed and pt voiding successfully ambulating into bathroom X 2. Honeycomb dressing clean, dry, intact. Will continue to monitor.

## 2018-12-30 DIAGNOSIS — I10 Essential (primary) hypertension: Secondary | ICD-10-CM

## 2018-12-30 MED ORDER — SENNA 8.6 MG PO TABS: 1.0000 | ORAL_TABLET | Freq: Every day | ORAL | 0 refills | Status: DC | PRN

## 2018-12-30 MED ORDER — SENNA 8.6 MG PO TABS
1.0000 | ORAL_TABLET | Freq: Every morning | ORAL | Status: DC
  Administered 2018-12-30: 8.6 mg via ORAL
  Filled 2018-12-30: qty 1

## 2018-12-30 MED ORDER — DOCUSATE SODIUM 100 MG PO CAPS
100.0000 mg | ORAL_CAPSULE | Freq: Every day | ORAL | Status: DC
  Administered 2018-12-30: 100 mg via ORAL
  Filled 2018-12-30: qty 1

## 2018-12-30 MED ORDER — OXYCODONE HCL 5 MG PO TABS: 5.0000 mg | ORAL_TABLET | Freq: Four times a day (QID) | ORAL | 0 refills | Status: DC | PRN

## 2018-12-30 NOTE — Progress Notes (Signed)
Occupational Therapy Treatment Patient Details Name: Carolyn Castro MRN: [id number] DOB: Nov 03, 1980 Today's Date: 12/30/2018    History of present illness 38 y.o. female s/p L5-S1 decompression and fusion. PMH includes: HTN, Asthma, Anxiety, hypothyroid    OT comments  Pt is making excellent progress.  She is mod I for functional transfers, and is able to verbalize how to use AE for LB ADLs.  She is eager to discharge home.   Follow Up Recommendations  No OT follow up;Supervision - Intermittent    Equipment Recommendations  None recommended by OT    Recommendations for Other Services      Precautions / Restrictions Precautions Precautions: Fall;Back Precaution Booklet Issued: Yes (comment) Required Braces or Orthoses: Spinal Brace Spinal Brace: Applied in sitting position Restrictions Weight Bearing Restrictions: No       Mobility Bed Mobility Overal bed mobility: Modified Independent                Transfers Overall transfer level: Modified independent                    Balance Overall balance assessment: Needs assistance   Sitting balance-Leahy Scale: Good       Standing balance-Leahy Scale: Fair                             ADL either performed or assessed with clinical judgement   ADL Overall ADL's : Needs assistance/impaired                     Lower Body Dressing: Moderate assistance;Sit to/from stand Lower Body Dressing Details (indicate cue type and reason): Pt able to verbalize how to use AE for LB ADLs  Toilet Transfer: Modified Independent;Ambulation;Comfort height toilet   Toileting- Clothing Manipulation and Hygiene: Modified independent Toileting - Clothing Manipulation Details (indicate cue type and reason): Pt reports she has been using toileting aid with good success      Functional mobility during ADLs: Modified independent       Vision       Perception     Praxis      Cognition  Arousal/Alertness: Awake/alert Behavior During Therapy: WFL for tasks assessed/performed Overall Cognitive Status: Within Functional Limits for tasks assessed                                          Exercises     Shoulder Instructions       General Comments Pt able to don brace mod I.  all questions answered     Pertinent Vitals/ Pain       Pain Assessment: 0-10 Pain Score: 5  Pain Location: surgical site Pain Descriptors / Indicators: Aching Pain Intervention(s): Monitored during session  Home Living                                          Prior Functioning/Environment              Frequency           Progress Toward Goals  OT Goals(current goals can now be found in the care plan section)  Progress towards OT goals: Progressing toward goals     Plan Discharge plan remains appropriate  Co-evaluation                 AM-PAC OT "6 Clicks" Daily Activity     Outcome Measure   Help from another person eating meals?: None Help from another person taking care of personal grooming?: None Help from another person toileting, which includes using toliet, bedpan, or urinal?: None Help from another person bathing (including washing, rinsing, drying)?: A Little Help from another person to put on and taking off regular upper body clothing?: None Help from another person to put on and taking off regular lower body clothing?: A Lot 6 Click Score: 21    End of Session Equipment Utilized During Treatment: Back brace;Rolling walker  OT Visit Diagnosis: Pain Pain - part of body: (back )   Activity Tolerance Patient tolerated treatment well   Patient Left in bed;with call bell/phone within reach   Nurse Communication Mobility status        Time: 7829-56211033-1046 OT Time Calculation (min): 13 min  Charges: OT General Charges $OT Visit: 1 Visit OT Treatments $Self Care/Home Management : 8-22 mins  Carolyn HawkingWendi Atiana Castro,  OTR/L Acute Rehabilitation Services Pager [phone] Office [phone]    Carolyn Castro, Carolyn Castro 12/30/2018, 11:28 AM

## 2018-12-30 NOTE — Discharge Instructions (Signed)
Discharge Instructions  Slowly increase your activity back to normal. Avoid lifting greater than 20 pounds until discussed with Carolyn Castro at follow up.  Your incision is closed with staples. We will remove these in clinic in 2 weeks at your follow up appointment.   Okay to shower on the day of discharge. Use regular soap and water and try to be gentle when cleaning your incision. No need for a dressing on your incision. In the first few days after surgery, there may be some bloody drainage from your wound. If this happens, you can cover your incision with a gauze dressing to prevent it from staining your clothes or bed linens. If your incision begins to itch, rub some bacitracin or neosporin ointment on it instead of scratching it.  Follow up with Carolyn Castro in 2 weeks after discharge. If you do not already have a discharge appointment, please call his office at [phone] to schedule a follow up appointment. If you have any concerns or questions, please call the office and let us know.

## 2018-12-30 NOTE — Plan of Care (Signed)
Patient stable, discussed POC with patient, agreeable with plan for D/C today, denies question/concerns at this time.  

## 2018-12-30 NOTE — Discharge Summary (Signed)
Physician Discharge Summary  Carolyn Castro ZOX:[id number] DOB: 05/13/1981 DOA: 12/26/2018  PCP: Shirlean Mylar, MD  Admit date: 12/26/2018 Discharge date: 12/30/2018  Admitted From: Home Disposition: Home  Recommendations for Outpatient Follow-up:  1. Follow up with neurosurgery (Dr. Conchita Paris) in 2 weeks  Home Health: None Equipment/Devices: None  Discharge Condition: Fair CODE STATUS: Full code Diet recommendation: Regular    Discharge Diagnoses:  Principal Problem:   Acute paraplegia (HCC)  Active Problems:   Low back pain   Hypothyroidism   Asthma   Lumbar stenosis with neurogenic claudication   Obesity, Class III, BMI 40-49.9 (morbid obesity) (HCC)   Essential hypertension   Near syncope   Lumbar stenosis  Brief narrative/HPI 38 year old morbidly obese female with history of asthma, allergies, chronic depression, hypothyroidism L5 and S1 neuroforaminal narrowing found in March this year after she presented with fall and low back pain being managed in the pain clinic as outpatient presented to the ED with progressive low back pain for past several days and 3-4 days of intermittent bowel and urinary incontinence.  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 Admitted for pain management and surgical intervention.  Hospital course Principal Problem:   Acute paraplegia (HCC) Secondary to L5-S1 foraminal stenosis.  Patient underwent L5 complete laminectomy with placement of anterior interbody device.  Tolerated well.   Pain much better controlled, incontinence has resolved and patient able to ambulate independently once assisted out of bed to chair. No follow-up needed per PT.  Stable for discharge per neurosurgery and patient will follow up with Dr. Conchita Paris in 2 weeks. will prescribe a short course of oxycodone as needed for pain.  Active Problems: Hypothyroidism Continue Synthroid.  TSH  normal.  Near syncope Reported having dizziness and being off balance last several days.  No loss of consciousness.  EKG unremarkable.  TSH normal.  2D echo with normal EF and no wall motion abnormality.  Stable on telemetry.  No issues during hospital stay  Obesity, Class III, BMI 40-49.9 (morbid obesity) (HCC) Needs counseling on weight loss and exercise as outpatient.  ? Obstructive sleep apnea As per her pulmonary during her visit last week.   planned on split-night sleep study for screening.  History of asthma with allergies Resume home inhaler and Singulair.  Chronic depression Continue Paxil    Family Communication  : None at bedside.  Updated parents on the phone.  Disposition Plan  :  Home  Consults  : Neurosurgery  Procedures  : MRI thoracic and lumbar spine, 2D echo, lumbar laminectomy  Discharge Instructions   Allergies as of 12/30/2018      Reactions   Ciprofloxacin Hcl Diarrhea, Other (See Comments)   Developed C-Diff      Medication List    TAKE these medications   acetaminophen 500 MG tablet Commonly known as: TYLENOL Take 1,000 mg by mouth every 6 (six) hours as needed (for pain).   ALPRAZolam 0.5 MG tablet Commonly known as: Xanax Take 1 tablet (0.5 mg total) by mouth 3 (three) times daily as needed for anxiety. What changed: when to take this   amLODipine 10 MG tablet Commonly known as: NORVASC Take 10 mg by mouth daily.   cholecalciferol 25 MCG (1000 UT) tablet Commonly known as: VITAMIN D3 Take 1,000 Units by mouth daily.   CRANBERRY PO Take 1 tablet by mouth 2 (two) times daily.   diphenhydrAMINE 25 MG tablet Commonly known as: BENADRYL Take 50  mg by mouth as needed for itching or allergies.   fexofenadine 180 MG tablet Commonly known as: ALLEGRA Take 180 mg by mouth daily.   ibuprofen 200 MG tablet Commonly known as: ADVIL Take 800 mg by mouth every 6 (six) hours as needed (for pain).   levothyroxine 75 MCG  tablet Commonly known as: SYNTHROID Take 75 mcg by mouth daily before breakfast.   mometasone 220 MCG/INH inhaler Commonly known as: ASMANEX Inhale 2 puffs into the lungs 2 (two) times daily.   mometasone 50 MCG/ACT nasal spray Commonly known as: NASONEX Place 2 sprays into the nose daily.   montelukast 10 MG tablet Commonly known as: SINGULAIR Take 10 mg by mouth every evening.   norethindrone-ethinyl estradiol 1-20 MG-MCG tablet Commonly known as: LOESTRIN Take 1 tablet by mouth daily.   omeprazole 20 MG tablet Commonly known as: PRILOSEC OTC Take 20 mg by mouth 2 (two) times daily.   oxyCODONE 5 MG immediate release tablet Commonly known as: Oxy IR/ROXICODONE Take 1 tablet (5 mg total) by mouth every 6 (six) hours as needed for moderate pain or severe pain.   PARoxetine 30 MG tablet Commonly known as: PAXIL Take 2 tablets (60 mg total) by mouth daily.   PROBIOTIC DAILY PO Take 1 tablet by mouth daily.   senna 8.6 MG Tabs tablet Commonly known as: SENOKOT Take 1 tablet (8.6 mg total) by mouth daily as needed for mild constipation or moderate constipation.   Stool Softener 100 MG capsule Generic drug: Docusate Sodium Take 100 mg by mouth 2 (two) times daily.   Ventolin HFA 108 (90 Base) MCG/ACT inhaler Generic drug: albuterol Inhale 2 puffs into the lungs every 6 (six) hours as needed for wheezing or shortness of breath.      Follow-up Information    Lisbeth Renshaw, MD. Schedule an appointment as soon as possible for a visit in 2 week(s).   Specialty: Neurosurgery Contact information: [address] [place] [address] [phone]        Shirlean Mylar, MD. Schedule an appointment as soon as possible for a visit in 1 week(s).   Specialty: Family Medicine Contact information: [address] [place] [address] [phone]          Allergies  Allergen Reactions  . Ciprofloxacin Hcl Diarrhea and Other  (See Comments)    Developed C-Diff        Procedures/Studies: Dg Chest 2 View  Result Date: 12/24/2018 CLINICAL DATA:  Chest pain EXAM: CHEST - 2 VIEW COMPARISON:  None. FINDINGS: The heart size and mediastinal contours are within normal limits. Both lungs are clear. The visualized skeletal structures are unremarkable. IMPRESSION: No active cardiopulmonary disease. Electronically Signed   By: Deatra Robinson M.D.   On: 12/24/2018 01:28   Dg Lumbar Spine 2-3 Views  Result Date: 12/28/2018 CLINICAL DATA:  L5-S1 PLIF EXAM: LUMBAR SPINE - 2-3 VIEW; DG C-ARM 61-120 MIN COMPARISON:  MRI 12/26/2018 FINDINGS: Two low resolution intraoperative spot views of the lumbar spine. Total fluoroscopy time was 1 minutes 22 seconds. The images demonstrate posterior rod and fixating screws at L5-S1 with placement of interbody device. IMPRESSION: Intraoperative fluoroscopic assistance provided during lumbar spine surgery Electronically Signed   By: Jasmine Pang M.D.   On: 12/28/2018 19:55   Ct Head Wo Contrast  Result Date: 12/26/2018 CLINICAL DATA:  HEADACHE WITH DIZZINESS X2 DAYS, H/O VERTIGO AND MIGRAINESCannot find appropriate structured reason for exam - see REASON FOR EXAM (FREE  TEXT) acute headache with neuro deficit EXAM: CT HEAD WITHOUT CONTRAST TECHNIQUE: Contiguous axial images were obtained from the base of the skull through the vertex without intravenous contrast. COMPARISON:  None. FINDINGS: Brain: No acute intracranial hemorrhage. No focal mass lesion. No CT evidence of acute infarction. No midline shift or mass effect. No hydrocephalus. Basilar cisterns are patent. Vascular: No hyperdense vessel or unexpected calcification. Skull: Normal. Negative for fracture or focal lesion. Sinuses/Orbits: Paranasal sinuses and mastoid air cells are clear. Orbits are clear. Other: None. IMPRESSION: Normal head CT. Electronically Signed   By: Genevive Bi M.D.   On: 12/26/2018 13:07   Mr Lumbar Spine Wo  Contrast  Result Date: 12/26/2018 CLINICAL DATA:  38 year old female with 2 days of back pain, lower extremity heaviness and decreased sensation. Incontinence. "T10 deficit". EXAM: MRI THORACIC WITHOUT AND WITH CONTRAST MRI LUMBAR SPINE WITHOUT CONTRAST TECHNIQUE: Multiplanar and multiecho pulse sequences of the thoracic and lumbar spine were obtained without and with intravenous contrast. CONTRAST:  10 milliliters Gadavist COMPARISON:  Lumbar MRI 09/04/2018.  Total spine CT 09/04/2018. FINDINGS: MRI THORACIC SPINE FINDINGS Limited cervical spine imaging:  Negative. Thoracic spine segmentation:  Appears to be normal. Alignment:  Stable, normal thoracic kyphosis. Vertebrae: No marrow edema or evidence of acute osseous abnormality. No abnormal enhancement identified. Visualized bone marrow signal is within normal limits. Cord: Spinal cord signal is within normal limits at all visualized levels. No abnormal intradural enhancement. The conus appears normal at T12-L1. Paraspinal and other soft tissues: Negative visible chest and upper abdominal viscera. Negative paraspinal soft tissues aside from confluent subcutaneous edema in the lower thoracic and upper lumbar (series 11, image 8). Disc levels: No significant thoracic disc degeneration. There is intermittent thoracic posterior element degeneration, notable at: T1-T2: Mild to moderate facet hypertrophy.  No stenosis. T2-T3: Mild to moderate facet hypertrophy, no stenosis. T4-T5: Mild to moderate right side facet hypertrophy.  No stenosis. T5-T6: Moderate or severe on the right facet hypertrophy including trace degenerative right facet joint fluid (series 12, image 33). No stenosis. T9-T10: Mild to moderate facet hypertrophy.  No stenosis. MRI LUMBAR SPINE FINDINGS Segmentation:  Normal, the same numbering system used in March. Alignment: Chronic grade 2 anterolisthesis of L5 on S1 is stable. Normal lumbar lordosis elsewhere. Vertebrae: Chronic degenerative endplate  changes at L5-S1. Chronic L5 pars fractures, better demonstrated on the March CT. No acute osseous abnormality identified. Intact visible sacrum and SI joints. Conus medullaris: Extends to the T12-L1 level and appears normal. Normal cauda quantum nerve roots aside from involvement by epidural lipomatosis in the lower lumbar spine. Paraspinal and other soft tissues: Lower thoracic and lumbar subcutaneous edema superimposed on large body habitus. Negative visible abdominal and pelvic viscera. Disc levels: Stable and negative T12-L1 through L3-L4. L4-L5: Negative disc. Mild bilateral facet hypertrophy. Increased and now moderate epidural lipomatosis (series 19, image 5) resulting in new mild spinal stenosis. No foraminal stenosis. L5-S1: Chronic pars fracture with grade 2 anterolisthesis and severe disc space loss. Stable pseudo disc and moderate posterior element hypertrophy. Chronic severe bilateral L5 foraminal stenosis appears stable. Substantially progressed epidural lipomatosis at this level which now completely effaces CSF from the thecal sac which now has a compressed Trefoil configuration (series 19, image 9). IMPRESSION: 1. Substantially increased lumbar epidural lipomatosis since March at L4-L5 and L5-S1 levels resulting in new spinal stenosis. 2. Otherwise stable MRI appearance of the lumbar spine since 09/04/2018: Chronic L5 pars fractures with grade 2 spondylolisthesis and severe bilateral foraminal stenosis  at L5-S1. 3. The thoracic spine is negative except for intermittent facet arthropathy not resulting in stenosis. No thoracic spinal cord abnormality identified. Electronically Signed   By: Odessa FlemingH  Hall M.D.   On: 12/26/2018 16:47   Mr Thoracic Spine W Wo Contrast  Result Date: 12/26/2018 CLINICAL DATA:  38 year old female with 2 days of back pain, lower extremity heaviness and decreased sensation. Incontinence. "T10 deficit". EXAM: MRI THORACIC WITHOUT AND WITH CONTRAST MRI LUMBAR SPINE WITHOUT  CONTRAST TECHNIQUE: Multiplanar and multiecho pulse sequences of the thoracic and lumbar spine were obtained without and with intravenous contrast. CONTRAST:  10 milliliters Gadavist COMPARISON:  Lumbar MRI 09/04/2018.  Total spine CT 09/04/2018. FINDINGS: MRI THORACIC SPINE FINDINGS Limited cervical spine imaging:  Negative. Thoracic spine segmentation:  Appears to be normal. Alignment:  Stable, normal thoracic kyphosis. Vertebrae: No marrow edema or evidence of acute osseous abnormality. No abnormal enhancement identified. Visualized bone marrow signal is within normal limits. Cord: Spinal cord signal is within normal limits at all visualized levels. No abnormal intradural enhancement. The conus appears normal at T12-L1. Paraspinal and other soft tissues: Negative visible chest and upper abdominal viscera. Negative paraspinal soft tissues aside from confluent subcutaneous edema in the lower thoracic and upper lumbar (series 11, image 8). Disc levels: No significant thoracic disc degeneration. There is intermittent thoracic posterior element degeneration, notable at: T1-T2: Mild to moderate facet hypertrophy.  No stenosis. T2-T3: Mild to moderate facet hypertrophy, no stenosis. T4-T5: Mild to moderate right side facet hypertrophy.  No stenosis. T5-T6: Moderate or severe on the right facet hypertrophy including trace degenerative right facet joint fluid (series 12, image 33). No stenosis. T9-T10: Mild to moderate facet hypertrophy.  No stenosis. MRI LUMBAR SPINE FINDINGS Segmentation:  Normal, the same numbering system used in March. Alignment: Chronic grade 2 anterolisthesis of L5 on S1 is stable. Normal lumbar lordosis elsewhere. Vertebrae: Chronic degenerative endplate changes at L5-S1. Chronic L5 pars fractures, better demonstrated on the March CT. No acute osseous abnormality identified. Intact visible sacrum and SI joints. Conus medullaris: Extends to the T12-L1 level and appears normal. Normal cauda quantum  nerve roots aside from involvement by epidural lipomatosis in the lower lumbar spine. Paraspinal and other soft tissues: Lower thoracic and lumbar subcutaneous edema superimposed on large body habitus. Negative visible abdominal and pelvic viscera. Disc levels: Stable and negative T12-L1 through L3-L4. L4-L5: Negative disc. Mild bilateral facet hypertrophy. Increased and now moderate epidural lipomatosis (series 19, image 5) resulting in new mild spinal stenosis. No foraminal stenosis. L5-S1: Chronic pars fracture with grade 2 anterolisthesis and severe disc space loss. Stable pseudo disc and moderate posterior element hypertrophy. Chronic severe bilateral L5 foraminal stenosis appears stable. Substantially progressed epidural lipomatosis at this level which now completely effaces CSF from the thecal sac which now has a compressed Trefoil configuration (series 19, image 9). IMPRESSION: 1. Substantially increased lumbar epidural lipomatosis since March at L4-L5 and L5-S1 levels resulting in new spinal stenosis. 2. Otherwise stable MRI appearance of the lumbar spine since 09/04/2018: Chronic L5 pars fractures with grade 2 spondylolisthesis and severe bilateral foraminal stenosis at L5-S1. 3. The thoracic spine is negative except for intermittent facet arthropathy not resulting in stenosis. No thoracic spinal cord abnormality identified. Electronically Signed   By: Odessa FlemingH  Hall M.D.   On: 12/26/2018 16:47   Dg Chest Port 1 View  Result Date: 12/26/2018 CLINICAL DATA:  Patient presents to ed the via [facility] states she was at work and felt like she was going  to faint and lowered herself to the floor. For the last several weeks c/o lower back pain states she has tingling and numbness in both legs. EXAM: PORTABLE CHEST 1 VIEW COMPARISON:  12/24/18 FINDINGS: The heart size and mediastinal contours are within normal limits. Both lungs are clear. The visualized skeletal structures are unremarkable. IMPRESSION: No active disease.  Electronically Signed   By: Elige KoHetal  Patel   On: 12/26/2018 12:06   Dg C-arm 1-60 Min  Result Date: 12/28/2018 CLINICAL DATA:  L5-S1 PLIF EXAM: LUMBAR SPINE - 2-3 VIEW; DG C-ARM 61-120 MIN COMPARISON:  MRI 12/26/2018 FINDINGS: Two low resolution intraoperative spot views of the lumbar spine. Total fluoroscopy time was 1 minutes 22 seconds. The images demonstrate posterior rod and fixating screws at L5-S1 with placement of interbody device. IMPRESSION: Intraoperative fluoroscopic assistance provided during lumbar spine surgery Electronically Signed   By: Jasmine PangKim  Fujinaga M.D.   On: 12/28/2018 19:55      Subjective: Continues to feel better.  Able to ambulate to the bathroom.  Had bowel movement today.  No incontinence.  Pain much better controlled  Discharge Exam: Vitals:   12/30/18 0735 12/30/18 0816  BP: (!) 163/109   Pulse: 92   Resp: 16   Temp: 98.7 F (37.1 C)   SpO2: 99% 99%   Vitals:   12/29/18 2356 12/30/18 0444 12/30/18 0735 12/30/18 0816  BP: (!) 143/89 (!) 151/99 (!) 163/109   Pulse: 96 96 92   Resp: 18 18 16    Temp: 98 F (36.7 C) 98.2 F (36.8 C) 98.7 F (37.1 C)   TempSrc: Oral  Oral   SpO2: 98% 99% 99% 99%  Weight:      Height:        General: Middle-aged morbidly obese female not in distress HEENT: Moist mucosa, supple neck Chest: Clear bilaterally CVs: Normal S1-S2 GI: Soft, nondistended, nontender Musculoskeletal: Warm, no edema, clean dressing over the lower back CNS: Alert and oriented, normal motor tone power and reflex in lower extremity, normal sensation     The results of significant diagnostics from this hospitalization (including imaging, microbiology, ancillary and laboratory) are listed below for reference.     Microbiology: Recent Results (from the past 240 hour(s))  SARS Coronavirus 2 (CEPHEID - Performed [facility]), Hosp Order     Status: None   Collection Time: 12/26/18  1:00 PM   Specimen: Nasopharyngeal Swab  Result  Value Ref Range Status   SARS Coronavirus 2 NEGATIVE NEGATIVE Final    Comment: (NOTE) If result is NEGATIVE SARS-CoV-2 target nucleic acids are NOT DETECTED. The SARS-CoV-2 RNA is generally detectable in upper and lower  respiratory specimens during the acute phase of infection. The lowest  concentration of SARS-CoV-2 viral copies this assay can detect is 250  copies / mL. A negative result does not preclude SARS-CoV-2 infection  and should not be used as the sole basis for treatment or other  patient management decisions.  A negative result may occur with  improper specimen collection / handling, submission of specimen other  than nasopharyngeal swab, presence of viral mutation(s) within the  areas targeted by this assay, and inadequate number of viral copies  (<250 copies / mL). A negative result must be combined with clinical  observations, patient history, and epidemiological information. If result is POSITIVE SARS-CoV-2 target nucleic acids are DETECTED. The SARS-CoV-2 RNA is generally detectable in upper and lower  respiratory specimens dur ing the acute phase of infection.  Positive  results are indicative of active infection with SARS-CoV-2.  Clinical  correlation with patient history and other diagnostic information is  necessary to determine patient infection status.  Positive results do  not rule out bacterial infection or co-infection with other viruses. If result is PRESUMPTIVE POSTIVE SARS-CoV-2 nucleic acids MAY BE PRESENT.   A presumptive positive result was obtained on the submitted specimen  and confirmed on repeat testing.  While 2019 novel coronavirus  (SARS-CoV-2) nucleic acids may be present in the submitted sample  additional confirmatory testing may be necessary for epidemiological  and / or clinical management purposes  to differentiate between  SARS-CoV-2 and other Sarbecovirus currently known to infect humans.  If clinically indicated additional testing  with an alternate test  methodology [phone]) is advised. The SARS-CoV-2 RNA is generally  detectable in upper and lower respiratory sp ecimens during the acute  phase of infection. The expected result is Negative. Fact Sheet for Patients:  StrictlyIdeas.no Fact Sheet for Healthcare Providers: BankingDealers.co.za This test is not yet approved or cleared by the [place] FDA and has been authorized for detection and/or diagnosis of SARS-CoV-2 by FDA under an Emergency Use Authorization (EUA).  This EUA will remain in effect (meaning this test can be used) for the duration of the COVID-19 declaration under Section 564(b)(1) of the Act, 21 U.S.C. section 360bbb-3(b)(1), unless the authorization is terminated or revoked sooner. Performed at [facility], [place] [address], [place], [place] [address]   MRSA PCR Screening     Status: None   Collection Time: 12/28/18 12:54 PM   Specimen: Nasopharyngeal  Result Value Ref Range Status   MRSA by PCR NEGATIVE NEGATIVE Final    Comment:        The GeneXpert MRSA Assay (FDA approved for NASAL specimens only), is one component of a comprehensive MRSA colonization surveillance program. It is not intended to diagnose MRSA infection nor to guide or monitor treatment for MRSA infections. Performed at [facility], [place] [address], [place], [place] [address]      Labs: BNP (last 3 results) No results for input(s): BNP in the last 8760 hours. Basic Metabolic Panel: Recent Labs  Lab 12/24/18 0041 12/26/18 1300 12/29/18 0349  NA 137 137 135  K 4.0 3.6 3.8  CL 106 103 100  CO2 17* 25 23  GLUCOSE 172* 77 121*  BUN 17 14 14   CREATININE 1.05* 1.05* 0.91  CALCIUM 9.2 8.6* 8.4*   Liver Function Tests: Recent Labs  Lab 12/24/18 0041 12/26/18 1300  AST 20 19  ALT 24 26  ALKPHOS 60 55  BILITOT 0.3 0.4  PROT 6.6 6.5  ALBUMIN 3.4* 3.2*   Recent Labs  Lab 12/24/18 0041   LIPASE 34   No results for input(s): AMMONIA in the last 168 hours. CBC: Recent Labs  Lab 12/24/18 0041 12/26/18 1300 12/29/18 0349  WBC 10.8* 12.2* 14.7*  HGB 12.8 12.7 12.0  HCT 39.7 38.9 37.6  MCV 90.6 89.2 89.3  PLT 399 410* 417*   Cardiac Enzymes: Recent Labs  Lab 12/24/18 0041  TROPONINI <0.03   BNP: Invalid input(s): POCBNP CBG: No results for input(s): GLUCAP in the last 168 hours. D-Dimer No results for input(s): DDIMER in the last 72 hours. Hgb A1c No results for input(s): HGBA1C in the last 72 hours. Lipid Profile No results for input(s): CHOL, HDL, LDLCALC, TRIG, CHOLHDL, LDLDIRECT in the last 72 hours. Thyroid function studies Recent Labs    12/27/18 1522  TSH 0.839   Anemia work up No results for input(s): VITAMINB12, FOLATE, FERRITIN, TIBC, IRON, RETICCTPCT in the last 72 hours. Urinalysis    Component Value Date/Time   COLORURINE STRAW (A) 12/24/2018 0423   APPEARANCEUR CLEAR 12/24/2018 0423   LABSPEC 1.013 12/24/2018 0423   PHURINE 5.0 12/24/2018 0423   GLUCOSEU NEGATIVE 12/24/2018 0423   HGBUR NEGATIVE 12/24/2018 0423   BILIRUBINUR NEGATIVE 12/24/2018 0423   KETONESUR NEGATIVE 12/24/2018 0423   PROTEINUR NEGATIVE 12/24/2018 0423   NITRITE NEGATIVE 12/24/2018 0423   LEUKOCYTESUR NEGATIVE 12/24/2018 0423   Sepsis Labs Invalid input(s): PROCALCITONIN,  WBC,  LACTICIDVEN Microbiology Recent Results (from the past 240 hour(s))  SARS Coronavirus 2 (CEPHEID - Performed in [facility] lab), Hosp Order     Status: None   Collection Time: 12/26/18  1:00 PM   Specimen: Nasopharyngeal Swab  Result Value Ref Range Status   SARS Coronavirus 2 NEGATIVE NEGATIVE Final    Comment: (NOTE) If result is NEGATIVE SARS-CoV-2 target nucleic acids are NOT DETECTED. The SARS-CoV-2 RNA is generally detectable in upper and lower  respiratory specimens during the acute phase of infection. The lowest  concentration of SARS-CoV-2 viral copies this  assay can detect is 250  copies / mL. A negative result does not preclude SARS-CoV-2 infection  and should not be used as the sole basis for treatment or other  patient management decisions.  A negative result may occur with  improper specimen collection / handling, submission of specimen other  than nasopharyngeal swab, presence of viral mutation(s) within the  areas targeted by this assay, and inadequate number of viral copies  (<250 copies / mL). A negative result must be combined with clinical  observations, patient history, and epidemiological information. If result is POSITIVE SARS-CoV-2 target nucleic acids are DETECTED. The SARS-CoV-2 RNA is generally detectable in upper and lower  respiratory specimens dur ing the acute phase of infection.  Positive  results are indicative of active infection with SARS-CoV-2.  Clinical  correlation with patient history and other diagnostic information is  necessary to determine patient infection status.  Positive results do  not rule out bacterial infection or co-infection with other viruses. If result is PRESUMPTIVE POSTIVE SARS-CoV-2 nucleic acids MAY BE PRESENT.   A presumptive positive result was obtained on the submitted specimen  and confirmed on repeat testing.  While 2019 novel coronavirus  (SARS-CoV-2) nucleic acids may be present in the submitted sample  additional confirmatory testing may be necessary for epidemiological  and / or clinical management purposes  to differentiate between  SARS-CoV-2 and other Sarbecovirus currently known to infect humans.  If clinically indicated additional testing with an alternate test  methodology [id number]) is advised. The SARS-CoV-2 RNA is generally  detectable in upper and lower respiratory sp ecimens during the acute  phase of infection. The expected result is Negative. Fact Sheet for Patients:  BoilerBrush.com.cyhttps://www.fda.gov/media/136312/download Fact Sheet for Healthcare  Providers: https://pope.com/https://www.fda.gov/media/136313/download This test is not yet approved or cleared by the [place] FDA and has been authorized for detection and/or diagnosis of SARS-CoV-2 by FDA under an Emergency Use Authorization (EUA).  This EUA will remain in effect (meaning this test can be used) for the duration of the COVID-19 declaration under Section 564(b)(1) of the Act, 21 U.S.C. section 360bbb-3(b)(1), unless the authorization is terminated or revoked sooner. Performed at [facility], [address] [place], [place] [address]   MRSA PCR Screening     Status: None  Collection Time: 12/28/18 12:54 PM   Specimen: Nasopharyngeal  Result Value Ref Range Status   MRSA by PCR NEGATIVE NEGATIVE Final    Comment:        The GeneXpert MRSA Assay (FDA approved for NASAL specimens only), is one component of a comprehensive MRSA colonization surveillance program. It is not intended to diagnose MRSA infection nor to guide or monitor treatment for MRSA infections. Performed at [facility], [address], [place], [place] [address]      Time coordinating discharge: 35 minutes  SIGNED:   Eddie North, MD  [facility] 12/30/2018, 11:04 AM Pager   If 7PM-7AM, please contact night-coverage [email or web address] Password [email or web address]

## 2018-12-30 NOTE — Progress Notes (Signed)
D/C instructions provided to patient, denies questions/concerns at this time. Patient transported to front entrance via WC, tol well. 

## 2018-12-30 NOTE — Progress Notes (Signed)
Neurosurgery Service Progress Note  Subjective: No acute events overnight, no new complaints  Objective: Vitals:   12/30/18 0444 12/30/18 0735 12/30/18 0816 12/30/18 1141  BP: (!) 151/99 (!) 163/109  (!) 156/97  Pulse: 96 92  (!) 111  Resp: 18 16  16   Temp: 98.2 F (36.8 C) 98.7 F (37.1 C)  98.7 F (37.1 C)  TempSrc:  Oral  Oral  SpO2: 99% 99% 99% 97%  Weight:      Height:       Temp (24hrs), Avg:98.3 F (36.8 C), Min:97.7 F (36.5 C), Max:98.7 F (37.1 C)  CBC Latest Ref Rng & Units 12/29/2018 12/26/2018 12/24/2018  WBC 4.0 - 10.5 K/uL 14.7(H) 12.2(H) 10.8(H)  Hemoglobin 12.0 - 15.0 g/dL 12.0 12.7 12.8  Hematocrit 36.0 - 46.0 % 37.6 38.9 39.7  Platelets 150 - 400 K/uL 417(H) 410(H) 399   BMP Latest Ref Rng & Units 12/29/2018 12/26/2018 12/24/2018  Glucose 70 - 99 mg/dL 121(H) 77 172(H)  BUN 6 - 20 mg/dL 14 14 17   Creatinine 0.44 - 1.00 mg/dL 0.91 1.05(H) 1.05(H)  Sodium 135 - 145 mmol/L 135 137 137  Potassium 3.5 - 5.1 mmol/L 3.8 3.6 4.0  Chloride 98 - 111 mmol/L 100 103 106  CO2 22 - 32 mmol/L 23 25 17(L)  Calcium 8.9 - 10.3 mg/dL 8.4(L) 8.6(L) 9.2    Intake/Output Summary (Last 24 hours) at 12/30/2018 1235 Last data filed at 12/29/2018 1700 Gross per 24 hour  Intake 75 ml  Output -  Net 75 ml    Current Facility-Administered Medications:  .  acetaminophen (TYLENOL) tablet 650 mg, 650 mg, Oral, Q6H PRN **OR** acetaminophen (TYLENOL) suppository 650 mg, 650 mg, Rectal, Q6H PRN, Dhungel, Nishant, MD .  albuterol (PROVENTIL) (2.5 MG/3ML) 0.083% nebulizer solution 3 mL, 3 mL, Nebulization, Q6H PRN, Dhungel, Nishant, MD .  ALPRAZolam Duanne Moron) tablet 0.5 mg, 0.5 mg, Oral, TID PRN, Dhungel, Nishant, MD, 0.5 mg at 12/29/18 2119 .  budesonide (PULMICORT) nebulizer solution 1 mg, 1 mg, Nebulization, BID, Dhungel, Nishant, MD, 1 mg at 12/30/18 0816 .  docusate sodium (COLACE) capsule 100 mg, 100 mg, Oral, Daily, Dhungel, Nishant, MD, 100 mg at 12/30/18 0848 .  enoxaparin  (LOVENOX) injection 40 mg, 40 mg, Subcutaneous, Q24H, Dhungel, Nishant, MD, 40 mg at 12/29/18 2119 .  fluticasone (FLONASE) 50 MCG/ACT nasal spray 1 spray, 1 spray, Each Nare, Daily, Dhungel, Nishant, MD, 1 spray at 12/30/18 0848 .  HYDROmorphone (DILAUDID) injection 1 mg, 1 mg, Intravenous, Q3H PRN, Dhungel, Nishant, MD, 1 mg at 12/30/18 0942 .  lactated ringers infusion, , Intravenous, Continuous, Annye Asa, MD, Last Rate: 10 mL/hr at 12/29/18 1400 .  levothyroxine (SYNTHROID) tablet 75 mcg, 75 mcg, Oral, QAC breakfast, Dhungel, Nishant, MD, 75 mcg at 12/30/18 0437 .  loratadine (CLARITIN) tablet 10 mg, 10 mg, Oral, Daily, Dhungel, Nishant, MD, 10 mg at 12/30/18 0847 .  mometasone-formoterol (DULERA) 200-5 MCG/ACT inhaler 2 puff, 2 puff, Inhalation, BID, Dhungel, Nishant, MD, 2 puff at 12/30/18 0816 .  montelukast (SINGULAIR) tablet 10 mg, 10 mg, Oral, QPM, Dhungel, Nishant, MD, 10 mg at 12/29/18 1617 .  omeprazole (PRILOSEC) capsule 20 mg, 20 mg, Oral, BID AC, Dhungel, Nishant, MD, 20 mg at 12/30/18 0847 .  ondansetron (ZOFRAN) tablet 4 mg, 4 mg, Oral, Q6H PRN **OR** ondansetron (ZOFRAN) injection 4 mg, 4 mg, Intravenous, Q6H PRN, Dhungel, Nishant, MD .  oxyCODONE (Oxy IR/ROXICODONE) immediate release tablet 5 mg, 5 mg, Oral, Q4H PRN, Dhungel, Nishant, MD,  5 mg at 12/30/18 0847 .  PARoxetine (PAXIL) tablet 60 mg, 60 mg, Oral, Daily, Dhungel, Nishant, MD, 60 mg at 12/30/18 0849 .  scopolamine (TRANSDERM-SCOP) 1 MG/3DAYS 1.5 mg, 1 patch, Transdermal, Once, Jairo BenJackson, Carswell, MD, 1.5 mg at 12/28/18 1334 .  senna (SENOKOT) tablet 8.6 mg, 1 tablet, Oral, q morning - 10a, Dhungel, Nishant, MD, 8.6 mg at 12/30/18 0848 .  traZODone (DESYREL) tablet 50 mg, 50 mg, Oral, QHS, Dhungel, Nishant, MD, 50 mg at 12/29/18 2119   Physical Exam: AOx3, PERRL, EOMI, FS, Strength 5/5 x4, SILTx4, no drift Incision c/d/i  Assessment & Plan: 38 y.o. woman s/p L5-S1 Gill procedure & PLIF, recovering  well.  -discharge home today, will place wound care / follow up / etc instructions in her discharge information in Epic  Copiaguehomas A Keandria Berrocal  12/30/18 12:35 PM

## 2018-12-31 NOTE — Anesthesia Postprocedure Evaluation (Signed)
Anesthesia Post Note  Patient: Carolyn Castro  Procedure(s) Performed: LUMBAR FIVE-SACRAL ONE POSTERIOR LUMBAR FUSION 1 LEVEL (N/A Spine Lumbar)     Patient location during evaluation: PACU Anesthesia Type: General Level of consciousness: awake and alert, patient cooperative and oriented Pain management: pain level controlled (pain improving) Vital Signs Assessment: post-procedure vital signs reviewed and stable Respiratory status: spontaneous breathing, nonlabored ventilation, respiratory function stable and patient connected to nasal cannula oxygen Cardiovascular status: blood pressure returned to baseline and stable Postop Assessment: no apparent nausea or vomiting Anesthetic complications: no Comments: *Late entry, pt eval in PACU    Last Vitals:  Vitals:   12/30/18 0816 12/30/18 1141  BP:  (!) 156/97  Pulse:  (!) 111  Resp:  16  Temp:  37.1 C  SpO2: 99% 97%    Last Pain:  Vitals:   12/30/18 1300  TempSrc:   PainSc: 4                  [name],E. Rollin Kotowski

## 2019-01-01 ENCOUNTER — Other Ambulatory Visit: Payer: Self-pay | Admitting: Anesthesiology

## 2019-01-01 DIAGNOSIS — M4317 Spondylolisthesis, lumbosacral region: Secondary | ICD-10-CM

## 2019-01-02 ENCOUNTER — Emergency Department (HOSPITAL_COMMUNITY): Payer: 59

## 2019-01-02 ENCOUNTER — Telehealth: Payer: Self-pay | Admitting: Emergency Medicine

## 2019-01-02 ENCOUNTER — Other Ambulatory Visit: Payer: Self-pay

## 2019-01-02 ENCOUNTER — Encounter (HOSPITAL_COMMUNITY): Payer: Self-pay | Admitting: Emergency Medicine

## 2019-01-02 ENCOUNTER — Inpatient Hospital Stay (HOSPITAL_COMMUNITY)
Admission: EM | Admit: 2019-01-02 | Discharge: 2019-01-04 | DRG: 194 | Disposition: A | Discharge status: Home / Self Care | Payer: 59 | Attending: Internal Medicine | Admitting: Internal Medicine

## 2019-01-02 DIAGNOSIS — M5441 Lumbago with sciatica, right side: Secondary | ICD-10-CM

## 2019-01-02 DIAGNOSIS — J189 Pneumonia, unspecified organism: Secondary | ICD-10-CM | POA: Diagnosis present

## 2019-01-02 DIAGNOSIS — Y95 Nosocomial condition: Secondary | ICD-10-CM | POA: Diagnosis present

## 2019-01-02 DIAGNOSIS — R739 Hyperglycemia, unspecified: Secondary | ICD-10-CM

## 2019-01-02 DIAGNOSIS — R0602 Shortness of breath: Secondary | ICD-10-CM

## 2019-01-02 DIAGNOSIS — I10 Essential (primary) hypertension: Secondary | ICD-10-CM | POA: Diagnosis present

## 2019-01-02 DIAGNOSIS — E669 Obesity, unspecified: Secondary | ICD-10-CM | POA: Diagnosis present

## 2019-01-02 DIAGNOSIS — D509 Iron deficiency anemia, unspecified: Secondary | ICD-10-CM | POA: Diagnosis present

## 2019-01-02 DIAGNOSIS — M545 Low back pain, unspecified: Secondary | ICD-10-CM | POA: Diagnosis present

## 2019-01-02 DIAGNOSIS — K219 Gastro-esophageal reflux disease without esophagitis: Secondary | ICD-10-CM | POA: Diagnosis not present

## 2019-01-02 DIAGNOSIS — J181 Lobar pneumonia, unspecified organism: Secondary | ICD-10-CM | POA: Diagnosis present

## 2019-01-02 DIAGNOSIS — F419 Anxiety disorder, unspecified: Secondary | ICD-10-CM | POA: Diagnosis present

## 2019-01-02 DIAGNOSIS — M48062 Spinal stenosis, lumbar region with neurogenic claudication: Secondary | ICD-10-CM | POA: Diagnosis present

## 2019-01-02 DIAGNOSIS — Z79890 Hormone replacement therapy (postmenopausal): Secondary | ICD-10-CM

## 2019-01-02 DIAGNOSIS — R0683 Snoring: Secondary | ICD-10-CM

## 2019-01-02 DIAGNOSIS — Z8249 Family history of ischemic heart disease and other diseases of the circulatory system: Secondary | ICD-10-CM

## 2019-01-02 DIAGNOSIS — Z20828 Contact with and (suspected) exposure to other viral communicable diseases: Secondary | ICD-10-CM | POA: Diagnosis present

## 2019-01-02 DIAGNOSIS — G4733 Obstructive sleep apnea (adult) (pediatric): Secondary | ICD-10-CM | POA: Diagnosis present

## 2019-01-02 DIAGNOSIS — Z793 Long term (current) use of hormonal contraceptives: Secondary | ICD-10-CM

## 2019-01-02 DIAGNOSIS — Z981 Arthrodesis status: Secondary | ICD-10-CM

## 2019-01-02 DIAGNOSIS — J4540 Moderate persistent asthma, uncomplicated: Secondary | ICD-10-CM

## 2019-01-02 DIAGNOSIS — Z6841 Body Mass Index (BMI) 40.0 and over, adult: Secondary | ICD-10-CM

## 2019-01-02 DIAGNOSIS — M5442 Lumbago with sciatica, left side: Secondary | ICD-10-CM

## 2019-01-02 DIAGNOSIS — D649 Anemia, unspecified: Secondary | ICD-10-CM

## 2019-01-02 DIAGNOSIS — E039 Hypothyroidism, unspecified: Secondary | ICD-10-CM | POA: Diagnosis present

## 2019-01-02 DIAGNOSIS — J45909 Unspecified asthma, uncomplicated: Secondary | ICD-10-CM | POA: Diagnosis present

## 2019-01-02 DIAGNOSIS — F329 Major depressive disorder, single episode, unspecified: Secondary | ICD-10-CM | POA: Diagnosis present

## 2019-01-02 LAB — CBC WITH DIFFERENTIAL/PLATELET
Abs Immature Granulocytes: 0.12 10*3/uL — ABNORMAL HIGH (ref 0.00–0.07)
Basophils Absolute: 0 10*3/uL (ref 0.0–0.1)
Basophils Relative: 0 %
Eosinophils Absolute: 0.1 10*3/uL (ref 0.0–0.5)
Eosinophils Relative: 1 %
HCT: 34.4 % — ABNORMAL LOW (ref 36.0–46.0)
Hemoglobin: 10.7 g/dL — ABNORMAL LOW (ref 12.0–15.0)
Immature Granulocytes: 1 %
Lymphocytes Relative: 32 %
Lymphs Abs: 3.3 10*3/uL (ref 0.7–4.0)
MCH: 29.2 pg (ref 26.0–34.0)
MCHC: 31.1 g/dL (ref 30.0–36.0)
MCV: 93.7 fL (ref 80.0–100.0)
Monocytes Absolute: 0.8 10*3/uL (ref 0.1–1.0)
Monocytes Relative: 8 %
Neutro Abs: 5.9 10*3/uL (ref 1.7–7.7)
Neutrophils Relative %: 58 %
Platelets: 341 10*3/uL (ref 150–400)
RBC: 3.67 MIL/uL — ABNORMAL LOW (ref 3.87–5.11)
RDW: 13.8 % (ref 11.5–15.5)
WBC: 10.3 10*3/uL (ref 4.0–10.5)
nRBC: 0 % (ref 0.0–0.2)

## 2019-01-02 LAB — TROPONIN I (HIGH SENSITIVITY)
Troponin I (High Sensitivity): 5 ng/L (ref <18)
Troponin I (High Sensitivity): 4 ng/L (ref <18)
Troponin I (High Sensitivity): 4 ng/L (ref <18)

## 2019-01-02 LAB — INFLUENZA PANEL BY PCR (TYPE A & B)
Influenza A By PCR: NEGATIVE
Influenza B By PCR: NEGATIVE

## 2019-01-02 LAB — BASIC METABOLIC PANEL
Anion gap: 6 (ref 5–15)
BUN: 13 mg/dL (ref 6–20)
CO2: 23 mmol/L (ref 22–32)
Calcium: 8.5 mg/dL — ABNORMAL LOW (ref 8.9–10.3)
Chloride: 108 mmol/L (ref 98–111)
Creatinine, Ser: 0.73 mg/dL (ref 0.44–1.00)
GFR calc Af Amer: >60 mL/min (ref >60)
GFR calc non Af Amer: >60 mL/min (ref >60)
Glucose, Bld: 103 mg/dL — ABNORMAL HIGH (ref 70–99)
Potassium: 3.8 mmol/L (ref 3.5–5.1)
Sodium: 137 mmol/L (ref 135–145)

## 2019-01-02 LAB — MRSA PCR SCREENING: MRSA by PCR: NEGATIVE

## 2019-01-02 LAB — SARS CORONAVIRUS 2 BY RT PCR (HOSPITAL ORDER, PERFORMED IN ~~LOC~~ HOSPITAL LAB): SARS Coronavirus 2: NEGATIVE

## 2019-01-02 LAB — HCG, QUANTITATIVE, PREGNANCY: hCG, Beta Chain, Quant, S: 1 m[IU]/mL (ref <5)

## 2019-01-02 LAB — BRAIN NATRIURETIC PEPTIDE: B Natriuretic Peptide: 18.3 pg/mL (ref 0.0–100.0)

## 2019-01-02 LAB — STREP PNEUMONIAE URINARY ANTIGEN: Strep Pneumo Urinary Antigen: NEGATIVE

## 2019-01-02 MED ORDER — OXYCODONE HCL 5 MG PO TABS
5.0000 mg | ORAL_TABLET | Freq: Four times a day (QID) | ORAL | Status: DC | PRN
  Administered 2019-01-02 – 2019-01-04 (×7): 5 mg via ORAL
  Filled 2019-01-02 (×7): qty 1

## 2019-01-02 MED ORDER — SODIUM CHLORIDE 0.9 % IV SOLN
INTRAVENOUS | Status: DC
  Administered 2019-01-02 (×2): via INTRAVENOUS

## 2019-01-02 MED ORDER — ENOXAPARIN SODIUM 60 MG/0.6ML ~~LOC~~ SOLN
60.0000 mg | SUBCUTANEOUS | Status: DC
  Administered 2019-01-02 – 2019-01-03 (×2): 60 mg via SUBCUTANEOUS
  Filled 2019-01-02 (×2): qty 0.6

## 2019-01-02 MED ORDER — ACETAMINOPHEN 500 MG PO TABS
1000.0000 mg | ORAL_TABLET | Freq: Four times a day (QID) | ORAL | Status: DC | PRN
  Administered 2019-01-02 – 2019-01-03 (×2): 1000 mg via ORAL
  Filled 2019-01-02 (×2): qty 2

## 2019-01-02 MED ORDER — AMLODIPINE BESYLATE 10 MG PO TABS
10.0000 mg | ORAL_TABLET | Freq: Every day | ORAL | Status: DC
  Administered 2019-01-02 – 2019-01-04 (×3): 10 mg via ORAL
  Filled 2019-01-02 (×3): qty 1

## 2019-01-02 MED ORDER — ZOLPIDEM TARTRATE 5 MG PO TABS
5.0000 mg | ORAL_TABLET | Freq: Every evening | ORAL | Status: DC | PRN
  Administered 2019-01-02 – 2019-01-03 (×2): 5 mg via ORAL
  Filled 2019-01-02 (×3): qty 1

## 2019-01-02 MED ORDER — IPRATROPIUM BROMIDE 0.02 % IN SOLN
0.5000 mg | Freq: Three times a day (TID) | RESPIRATORY_TRACT | Status: DC
  Administered 2019-01-03 – 2019-01-04 (×4): 0.5 mg via RESPIRATORY_TRACT
  Filled 2019-01-02 (×4): qty 2.5

## 2019-01-02 MED ORDER — OMEPRAZOLE MAGNESIUM 20 MG PO TBEC: 20.0000 mg | DELAYED_RELEASE_TABLET | Freq: Two times a day (BID) | ORAL | Status: DC

## 2019-01-02 MED ORDER — BUDESONIDE 0.25 MG/2ML IN SUSP
0.2500 mg | Freq: Two times a day (BID) | RESPIRATORY_TRACT | Status: DC
  Administered 2019-01-02 – 2019-01-04 (×4): 0.25 mg via RESPIRATORY_TRACT
  Filled 2019-01-02 (×4): qty 2

## 2019-01-02 MED ORDER — DOCUSATE SODIUM 100 MG PO CAPS
100.0000 mg | ORAL_CAPSULE | Freq: Two times a day (BID) | ORAL | Status: DC
  Administered 2019-01-02 – 2019-01-03 (×3): 100 mg via ORAL
  Filled 2019-01-02 (×3): qty 1

## 2019-01-02 MED ORDER — LEVALBUTEROL HCL 0.63 MG/3ML IN NEBU
0.6300 mg | INHALATION_SOLUTION | Freq: Four times a day (QID) | RESPIRATORY_TRACT | Status: DC
  Administered 2019-01-02 (×3): 0.63 mg via RESPIRATORY_TRACT
  Filled 2019-01-02 (×3): qty 3

## 2019-01-02 MED ORDER — LORATADINE 10 MG PO TABS
10.0000 mg | ORAL_TABLET | Freq: Every day | ORAL | Status: DC
  Administered 2019-01-02 – 2019-01-04 (×3): 10 mg via ORAL
  Filled 2019-01-02 (×3): qty 1

## 2019-01-02 MED ORDER — GUAIFENESIN ER 600 MG PO TB12
1200.0000 mg | ORAL_TABLET | Freq: Two times a day (BID) | ORAL | Status: DC
  Administered 2019-01-02 – 2019-01-04 (×5): 1200 mg via ORAL
  Filled 2019-01-02 (×5): qty 2

## 2019-01-02 MED ORDER — VANCOMYCIN HCL 10 G IV SOLR
1250.0000 mg | Freq: Two times a day (BID) | INTRAVENOUS | Status: DC
  Administered 2019-01-02 – 2019-01-03 (×2): 1250 mg via INTRAVENOUS
  Filled 2019-01-02 (×2): qty 1250

## 2019-01-02 MED ORDER — IPRATROPIUM BROMIDE 0.02 % IN SOLN
0.5000 mg | Freq: Four times a day (QID) | RESPIRATORY_TRACT | Status: DC
  Administered 2019-01-02 (×3): 0.5 mg via RESPIRATORY_TRACT
  Filled 2019-01-02 (×3): qty 2.5

## 2019-01-02 MED ORDER — VITAMIN D 25 MCG (1000 UNIT) PO TABS
1000.0000 [IU] | ORAL_TABLET | Freq: Every day | ORAL | Status: DC
  Administered 2019-01-02 – 2019-01-04 (×3): 1000 [IU] via ORAL
  Filled 2019-01-02 (×3): qty 1

## 2019-01-02 MED ORDER — LEVALBUTEROL HCL 0.63 MG/3ML IN NEBU
0.6300 mg | INHALATION_SOLUTION | Freq: Three times a day (TID) | RESPIRATORY_TRACT | Status: DC
  Administered 2019-01-03 – 2019-01-04 (×4): 0.63 mg via RESPIRATORY_TRACT
  Filled 2019-01-02 (×4): qty 3

## 2019-01-02 MED ORDER — VANCOMYCIN HCL 10 G IV SOLR
2000.0000 mg | Freq: Once | INTRAVENOUS | Status: AC
  Administered 2019-01-02: 09:00:00 2000 mg via INTRAVENOUS
  Filled 2019-01-02: qty 2000

## 2019-01-02 MED ORDER — LEVOTHYROXINE SODIUM 50 MCG PO TABS
75.0000 ug | ORAL_TABLET | Freq: Every day | ORAL | Status: DC
  Administered 2019-01-03 – 2019-01-04 (×2): 75 ug via ORAL
  Filled 2019-01-02 (×2): qty 1

## 2019-01-02 MED ORDER — MOMETASONE FUROATE 220 MCG/INH IN AEPB: 2.0000 | INHALATION_SPRAY | Freq: Two times a day (BID) | RESPIRATORY_TRACT | Status: DC

## 2019-01-02 MED ORDER — PANTOPRAZOLE SODIUM 40 MG PO TBEC
40.0000 mg | DELAYED_RELEASE_TABLET | Freq: Two times a day (BID) | ORAL | Status: DC
  Administered 2019-01-02 – 2019-01-04 (×5): 40 mg via ORAL
  Filled 2019-01-02 (×5): qty 1

## 2019-01-02 MED ORDER — SODIUM CHLORIDE 0.9 % IV SOLN
2.0000 g | Freq: Once | INTRAVENOUS | Status: AC
  Administered 2019-01-02: 07:00:00 2 g via INTRAVENOUS
  Filled 2019-01-02: qty 2

## 2019-01-02 MED ORDER — ONDANSETRON HCL 4 MG PO TABS: 4.0000 mg | ORAL_TABLET | Freq: Four times a day (QID) | ORAL | Status: DC | PRN

## 2019-01-02 MED ORDER — PAROXETINE HCL 20 MG PO TABS
60.0000 mg | ORAL_TABLET | Freq: Every day | ORAL | Status: DC
  Administered 2019-01-02 – 2019-01-04 (×3): 60 mg via ORAL
  Filled 2019-01-02 (×3): qty 3

## 2019-01-02 MED ORDER — IPRATROPIUM-ALBUTEROL 0.5-2.5 (3) MG/3ML IN SOLN: 3.0000 mL | Freq: Four times a day (QID) | RESPIRATORY_TRACT | Status: DC

## 2019-01-02 MED ORDER — RISAQUAD PO CAPS
1.0000 | ORAL_CAPSULE | Freq: Every day | ORAL | Status: DC
  Administered 2019-01-02 – 2019-01-04 (×3): 1 via ORAL
  Filled 2019-01-02 (×3): qty 1

## 2019-01-02 MED ORDER — MONTELUKAST SODIUM 10 MG PO TABS
10.0000 mg | ORAL_TABLET | Freq: Every evening | ORAL | Status: DC
  Administered 2019-01-02 – 2019-01-03 (×2): 10 mg via ORAL
  Filled 2019-01-02 (×2): qty 1

## 2019-01-02 MED ORDER — ALPRAZOLAM 0.5 MG PO TABS
0.5000 mg | ORAL_TABLET | Freq: Three times a day (TID) | ORAL | Status: DC | PRN
  Administered 2019-01-02 – 2019-01-03 (×2): 0.5 mg via ORAL
  Filled 2019-01-02 (×2): qty 1

## 2019-01-02 MED ORDER — ONDANSETRON HCL 4 MG/2ML IJ SOLN: 4.0000 mg | Freq: Four times a day (QID) | INTRAMUSCULAR | Status: DC | PRN

## 2019-01-02 MED ORDER — ALBUTEROL SULFATE (2.5 MG/3ML) 0.083% IN NEBU: 2.5000 mg | INHALATION_SOLUTION | Freq: Four times a day (QID) | RESPIRATORY_TRACT | Status: DC | PRN

## 2019-01-02 MED ORDER — SODIUM CHLORIDE 0.9 % IV SOLN
2.0000 g | Freq: Three times a day (TID) | INTRAVENOUS | Status: DC
  Administered 2019-01-02 – 2019-01-03 (×3): 2 g via INTRAVENOUS
  Filled 2019-01-02 (×3): qty 2

## 2019-01-02 MED ORDER — IPRATROPIUM-ALBUTEROL 20-100 MCG/ACT IN AERS: 1.0000 | INHALATION_SPRAY | Freq: Four times a day (QID) | RESPIRATORY_TRACT | Status: DC

## 2019-01-02 MED ORDER — IOHEXOL 350 MG/ML SOLN
100.0000 mL | Freq: Once | INTRAVENOUS | Status: AC | PRN
  Administered 2019-01-02: 100 mL via INTRAVENOUS

## 2019-01-02 MED ORDER — SODIUM CHLORIDE 0.9 % IV SOLN
500.0000 mg | Freq: Once | INTRAVENOUS | Status: AC
  Administered 2019-01-02: 08:00:00 500 mg via INTRAVENOUS
  Filled 2019-01-02: qty 500

## 2019-01-02 MED ORDER — SODIUM CHLORIDE (PF) 0.9 % IJ SOLN
INTRAMUSCULAR | Status: AC
  Filled 2019-01-02: qty 50

## 2019-01-02 MED ORDER — FLUTICASONE PROPIONATE 50 MCG/ACT NA SUSP
1.0000 | Freq: Every day | NASAL | Status: DC
  Filled 2019-01-02: qty 16

## 2019-01-02 MED ORDER — VANCOMYCIN HCL IN DEXTROSE 1-5 GM/200ML-% IV SOLN: 1000.0000 mg | Freq: Once | INTRAVENOUS | Status: DC

## 2019-01-02 MED ORDER — POLYETHYLENE GLYCOL 3350 17 G PO PACK
17.0000 g | PACK | Freq: Every day | ORAL | Status: DC | PRN
  Filled 2019-01-02: qty 1

## 2019-01-02 NOTE — Progress Notes (Signed)
A consult was received from an ED physician for Vancomycin, cefepime per pharmacy dosing.  The patient's profile has been reviewed for ht/wt/allergies/indication/available labs.   A one time order has been placed for Vancomycin 2gm iv x1, and Cefepime 2gm iv x1.  Further antibiotics/pharmacy consults should be ordered by admitting physician if indicated.                       Thank you, Nani Skillern Crowford 01/02/2019  6:48 AM

## 2019-01-02 NOTE — ED Triage Notes (Signed)
[facility] transported pt to [facility] ED and reports the following:  Pt called called EMS because SOB (3 days) and cough (2 days). Worsens at night. When she lies down at night said lungs feel tight.   Hx HTN, asthma

## 2019-01-02 NOTE — ED Notes (Signed)
Attempted to call report at assigned time. Primary RN Museum/gallery conservator had just given patient medication and asked that this RN call back in 5 minutes for report.

## 2019-01-02 NOTE — ED Notes (Signed)
Called respiratory to check peak flow

## 2019-01-02 NOTE — Progress Notes (Signed)
Pharmacy Antibiotic Note  Carolyn Castro is a 38 y.o. female admitted on 01/02/2019 with pneumonia.  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  Antibiotics already ordered as one-time doses in the ED were cefepime 2 grams IV (given), azithromycin 500 mg IV (given), vancomycin 2 grams IV (dose in progress)  Plan: Cefepime 2 grams IV q8h Vancomycin - 2 gram IV loading dose (in progress), then 1250 mg IV q12h. Goal AUC 400-550 Expected AUC: 532 SCr used: 0.8  Height: 5\' 4"  (162.6 cm) Weight: 280 lb (127 kg) IBW/kg (Calculated) : 54.7  Temp (24hrs), Avg:98.4 F (36.9 C), Min:98.4 F (36.9 C), Max:98.4 F (36.9 C)  Recent Labs  Lab 12/26/18 1300 12/29/18 0349 01/02/19 0329  WBC 12.2* 14.7* 10.3  CREATININE 1.05* 0.91 0.73    Estimated Creatinine Clearance: 128.3 mL/min (by C-G formula based on SCr of 0.73 mg/dL).    Allergies  Allergen Reactions  . Ciprofloxacin Hcl Diarrhea and Other (See Comments)    Developed C-Diff    Antimicrobials this admission: 7/1 azithro x 1 dose 7/1 cefepime >> 7/1 vanco >>   Dose adjustments this admission: n/a  Microbiology results: 7/1 SARS CoV 2: negative  Thank you for allowing pharmacy to be a part of this patient's care.  Clayburn Pert, PharmD, BCPS [phone] 01/02/2019  9:00 AM

## 2019-01-02 NOTE — H&P (Signed)
History and Physical    ODA PLACKE SLP:[id number] DOB: May 26, 1981 DOA: 01/02/2019  PCP: Maurice Small, MD   Patient coming from: Home  Chief Complaint: Shortness of breath, cough, chest tightness  HPI: Carolyn Castro is a 38 y.o. 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  Patient states that is progressively gotten worse and she has had wheezing and a nonproductive cough and has been taking albuterol with minor relief.  States that her symptoms are worse when she lays flat and doing describes chest pain is worsened with deep inspiration and coughing.  States that she is unable to bring up any sputum and states that because symptoms progressively gotten worse she presented to the emergency room for further evaluation.  She denies any nausea, vomiting, but does describe some lightheadedness after coughing.  States that she also has some abdominal pain from coughing as well.  Denies any leg swelling and states that her legs are getting better since the procedure.  Denies any diarrhea or constipation at this time.  No other concerns or complaints and [facility] was called to admit this patient for H CAP pneumonia as patient was recently hospitalized for over 48 hours last week secondary to her acute lumbar stenosis with neurogenic claudication.  ED Course: She had basic blood work done and a CT of the chest which showed a pneumonia in the left lobe and right upper lobes.  Was given broad-spectrum antibiotics with azithromycin, cefepime, and vancomycin.  Coronavirus testing was ordered but still  Pending.  Review of Systems: As per HPI otherwise 10 point review of systems negative.   Past Medical History:  Diagnosis Date   Anxiety    Asthma    Back pain    Hypertension    Hypothyroid    Past Surgical History:  Procedure Laterality Date   BACK SURGERY     COLONOSCOPY     LAPAROSCOPIC OVARIAN CYSTECTOMY Left 06/29/2018   Procedure: LAPAROSCOPIC OVARIAN CYSTECTOMY;  Surgeon: Christophe Louis, MD;  Location: [facility];  Service: Gynecology;  Laterality: Left;   TONSILLECTOMY     WISDOM TOOTH EXTRACTION     SOCIAL HISTORY  reports that she has never smoked. She has never used smokeless tobacco. She reports that she does not drink alcohol or use drugs.  Allergies  Allergen Reactions   Ciprofloxacin Hcl Diarrhea and Other (See Comments)    Developed C-Diff   Family History  Problem Relation Age of Onset   Hypertension Mother    Hypertension Father    Prior to Admission medications   Medication Sig Start Date End Date Taking? Authorizing Provider  acetaminophen (TYLENOL) 500 MG tablet Take 1,000 mg by mouth every 6 (six) hours as needed (for pain).    Yes [provider]  albuterol (VENTOLIN HFA) 108 (90 Base) MCG/ACT inhaler Inhale 2 puffs into the lungs every 6 (six) hours as needed for wheezing or shortness of breath.   Yes [provider]  ALPRAZolam (XANAX) 0.5 MG tablet Take 1 tablet (0.5 mg total) by mouth 3 (three) times daily as needed for anxiety. Patient taking differently: Take 0.5 mg  by mouth 2 (two) times daily as needed for anxiety.  09/27/18  Yes Cottle, Billey Co., MD  amLODipine (NORVASC) 10 MG tablet Take 10 mg by mouth daily.   Yes [provider]  cholecalciferol (VITAMIN D3) 25 MCG (1000 UT) tablet Take 1,000 Units by mouth daily.   Yes [provider]  CRANBERRY PO Take 1 tablet by mouth 2 (two) times daily.    Yes [provider]  diphenhydrAMINE (BENADRYL) 25 MG tablet Take 50 mg by  mouth as needed for itching or allergies.    Yes [provider]  Docusate Sodium (STOOL SOFTENER) 100 MG capsule Take 100 mg by mouth 2 (two) times daily.   Yes [provider]  fexofenadine (ALLEGRA) 180 MG tablet Take 180 mg by mouth daily.   Yes [provider]  ibuprofen (ADVIL,MOTRIN) 200 MG tablet Take 800 mg by mouth every 6 (six) hours as needed (for pain).    Yes [provider]  levothyroxine (SYNTHROID, LEVOTHROID) 75 MCG tablet Take 75 mcg by mouth daily before breakfast.   Yes [provider]  mometasone (ASMANEX) 220 MCG/INH inhaler Inhale 2 puffs into the lungs 2 (two) times daily.    Yes [provider]  mometasone (NASONEX) 50 MCG/ACT nasal spray Place 2 sprays into the nose daily.   Yes [provider]  montelukast (SINGULAIR) 10 MG tablet Take 10 mg by mouth every evening.    Yes [provider]  norethindrone-ethinyl estradiol (MICROGESTIN,JUNEL,LOESTRIN) 1-20 MG-MCG tablet Take 1 tablet by mouth daily.   Yes [provider]  omeprazole (PRILOSEC OTC) 20 MG tablet Take 20 mg by mouth 2 (two) times daily.    Yes [provider]  oxyCODONE (OXY IR/ROXICODONE) 5 MG immediate release tablet Take 1 tablet (5 mg total) by mouth every 6 (six) hours as needed for moderate pain or severe pain. 12/30/18  Yes Dhungel, Nishant, MD  PARoxetine (PAXIL) 30 MG tablet Take 2 tablets (60 mg total) by mouth daily. 09/27/18  Yes Cottle, Billey Co., MD  Probiotic Product (PROBIOTIC DAILY PO) Take 1 tablet by mouth daily.   Yes [provider]  senna (SENOKOT) 8.6 MG TABS tablet Take 1 tablet (8.6 mg total) by mouth daily as needed for mild constipation or moderate constipation. 12/30/18  Yes Dhungel, Flonnie Overman, MD   Physical Exam: Vitals:   01/02/19 0530 01/02/19 0630 01/02/19 0700 01/02/19 0800  BP: (!) 173/105 124/71 125/89 (!) 146/103  Pulse: (!) 108 (!) 104 (!) 103 (!) 116  Resp: 18 (!) 23 16 (!) 27   Temp:      TempSrc:      SpO2: 97% 97% 97% 98%  Weight:      Height:       Constitutional: WN/WD morbidly obese Caucasian female currently NAD and appears calm but uncomfortable laying flat Eyes: Lids and conjunctivae normal, sclerae anicteric  ENMT: External Ears, Nose appear normal. Grossly normal hearing.  Neck: Appears normal, supple, no cervical masses, normal ROM, no appreciable thyromegaly; Difficult to assess JVD Respiratory: Diminished to auscultation bilaterally, no wheezing, rales, rhonchi or crackles. Normal respiratory effort and patient is not tachypenic. No accessory muscle use.  Cardiovascular: Tachycardic Rate, no murmurs / rubs / gallops. S1 and S2 auscultated. Trace extremity edema. Abdomen: Soft,Tender to palpate, Distended 2/2 body habitus. No masses palpated. No appreciable hepatosplenomegaly. Bowel sounds positive x4.  GU: Deferred. Musculoskeletal: No clubbing / cyanosis of digits/nails. No joint deformity upper and lower extremities.  Skin: No rashes, lesions, ulcers on a limited skin evaluation. No induration; Warm and dry.  Neurologic: CN 2-12 grossly intact with no focal deficits. Strength 4/5 in LE. Romberg sign and cerebellar reflexes not assessed.  Psychiatric: Normal judgment and insight. Alert and oriented x 3. Slightly anxious mood and appropriate affect.   Labs on Admission: I have personally reviewed following labs and imaging studies  CBC: Recent Labs  Lab 12/26/18 1300 12/29/18 0349 01/02/19 0329  WBC 12.2* 14.7* 10.3  NEUTROABS  --   --  5.9  HGB 12.7 12.0 10.7*  HCT 38.9 37.6 34.4*  MCV 89.2 89.3 93.7  PLT 410* 417* 696   Basic Metabolic Panel: Recent Labs  Lab 12/26/18 1300 12/29/18 0349 01/02/19 0329  NA 137 135 137  K 3.6 3.8 3.8  CL 103 100 108  CO2 '25 23 23  ' GLUCOSE 77 121* 103*  BUN '14 14 13  ' CREATININE 1.05* 0.91 0.73  CALCIUM 8.6* 8.4* 8.5*   GFR: Estimated Creatinine Clearance: 128.3 mL/min (by C-G formula based on  SCr of 0.73 mg/dL). Liver Function Tests: Recent Labs  Lab 12/26/18 1300  AST 19  ALT 26  ALKPHOS 55  BILITOT 0.4  PROT 6.5  ALBUMIN 3.2*   No results for input(s): LIPASE, AMYLASE in the last 168 hours. No results for input(s): AMMONIA in the last 168 hours. Coagulation Profile: Recent Labs  Lab 12/29/18 0349  INR 1.0   Cardiac Enzymes: No results for input(s): CKTOTAL, CKMB, CKMBINDEX, TROPONINI in the last 168 hours. BNP (last 3 results) No results for input(s): PROBNP in the last 8760 hours. HbA1C: No results for input(s): HGBA1C in the last 72 hours. CBG: No results for input(s): GLUCAP in the last 168 hours. Lipid Profile: No results for input(s): CHOL, HDL, LDLCALC, TRIG, CHOLHDL, LDLDIRECT in the last 72 hours. Thyroid Function Tests: No results for input(s): TSH, T4TOTAL, FREET4, T3FREE, THYROIDAB in the last 72 hours. Anemia Panel: No results for input(s): VITAMINB12, FOLATE, FERRITIN, TIBC, IRON, RETICCTPCT in the last 72 hours. Urine analysis:    Component Value Date/Time   COLORURINE STRAW (A) 12/24/2018 0423   APPEARANCEUR CLEAR 12/24/2018 0423   LABSPEC 1.013 12/24/2018 0423   PHURINE 5.0 12/24/2018 0423   GLUCOSEU NEGATIVE 12/24/2018 0423   HGBUR NEGATIVE 12/24/2018 0423   BILIRUBINUR NEGATIVE 12/24/2018 0423   KETONESUR NEGATIVE 12/24/2018 0423   PROTEINUR NEGATIVE 12/24/2018 0423   NITRITE NEGATIVE 12/24/2018 0423   LEUKOCYTESUR NEGATIVE 12/24/2018 0423   Sepsis Labs: !!!!!!!!!!!!!!!!!!!!!!!!!!!!!!!!!!!!!!!!!!!! '@LABRCNTIP' (procalcitonin:4,lacticidven:4) ) Recent Results (from the past 240 hour(s))  SARS Coronavirus 2 (CEPHEID - Performed in [facility] hospital lab), Hosp Order     Status: None   Collection Time: 12/26/18  1:00 PM   Specimen: Nasopharyngeal Swab  Result Value Ref Range Status   SARS Coronavirus 2 NEGATIVE NEGATIVE Final    Comment: (NOTE) If result is NEGATIVE SARS-CoV-2 target nucleic acids are NOT DETECTED. The  SARS-CoV-2 RNA is generally detectable in upper and lower  respiratory specimens during the acute phase of infection. The lowest  concentration of SARS-CoV-2 viral copies this assay can detect is 250  copies / mL. A negative result does not preclude SARS-CoV-2 infection  and should not be used as the sole basis for treatment or other  patient management decisions.  A negative result may occur with  improper specimen collection / handling, submission of specimen other  than nasopharyngeal swab, presence of viral mutation(s) within the  areas targeted by this assay,  and inadequate number of viral copies  (<250 copies / mL). A negative result must be combined with clinical  observations, patient history, and epidemiological information. If result is POSITIVE SARS-CoV-2 target nucleic acids are DETECTED. The SARS-CoV-2 RNA is generally detectable in upper and lower  respiratory specimens dur ing the acute phase of infection.  Positive  results are indicative of active infection with SARS-CoV-2.  Clinical  correlation with patient history and other diagnostic information is  necessary to determine patient infection status.  Positive results do  not rule out bacterial infection or co-infection with other viruses. If result is PRESUMPTIVE POSTIVE SARS-CoV-2 nucleic acids MAY BE PRESENT.   A presumptive positive result was obtained on the submitted specimen  and confirmed on repeat testing.  While 2019 novel coronavirus  (SARS-CoV-2) nucleic acids may be present in the submitted sample  additional confirmatory testing may be necessary for epidemiological  and / or clinical management purposes  to differentiate between  SARS-CoV-2 and other Sarbecovirus currently known to infect humans.  If clinically indicated additional testing with an alternate test  methodology [phone]) is advised. The SARS-CoV-2 RNA is generally  detectable in upper and lower respiratory sp ecimens during the acute    phase of infection. The expected result is Negative. Fact Sheet for Patients:  StrictlyIdeas.no Fact Sheet for Healthcare Providers: BankingDealers.co.za This test is not yet approved or cleared by the [place] FDA and has been authorized for detection and/or diagnosis of SARS-CoV-2 by FDA under an Emergency Use Authorization (EUA).  This EUA will remain in effect (meaning this test can be used) for the duration of the COVID-19 declaration under Section 564(b)(1) of the Act, 21 U.S.C. section 360bbb-3(b)(1), unless the authorization is terminated or revoked sooner. Performed at [facility], [place] [address], [place], [place] [address]   MRSA PCR Screening     Status: None   Collection Time: 12/28/18 12:54 PM   Specimen: Nasopharyngeal  Result Value Ref Range Status   MRSA by PCR NEGATIVE NEGATIVE Final    Comment:        The GeneXpert MRSA Assay (FDA approved for NASAL specimens only), is one component of a comprehensive MRSA colonization surveillance program. It is not intended to diagnose MRSA infection nor to guide or monitor treatment for MRSA infections. Performed at [facility], [place] [address] [place], [place] [address]   SARS Coronavirus 2 (CEPHEID- Performed in [facility] hospital lab), Hosp Order     Status: None   Collection Time: 01/02/19  6:54 AM   Specimen: Nasopharyngeal Swab  Result Value Ref Range Status   SARS Coronavirus 2 NEGATIVE NEGATIVE Final    Comment: (NOTE) If result is NEGATIVE SARS-CoV-2 target nucleic acids are NOT DETECTED. The SARS-CoV-2 RNA is generally detectable in upper and lower  respiratory specimens during the acute phase of infection. The lowest  concentration of SARS-CoV-2 viral copies this assay can detect is 250  copies / mL. A negative result does not preclude SARS-CoV-2 infection  and should not be used as the sole basis for treatment or other  patient management  decisions.  A negative result may occur with  improper specimen collection / handling, submission of specimen other  than nasopharyngeal swab, presence of viral mutation(s) within the  areas targeted by this assay, and inadequate number of viral copies  (<250 copies / mL). A negative result must be combined with clinical  observations, patient history, and epidemiological information. If result is POSITIVE  SARS-CoV-2 target nucleic acids are DETECTED. The SARS-CoV-2 RNA is generally detectable in upper and lower  respiratory specimens dur ing the acute phase of infection.  Positive  results are indicative of active infection with SARS-CoV-2.  Clinical  correlation with patient history and other diagnostic information is  necessary to determine patient infection status.  Positive results do  not rule out bacterial infection or co-infection with other viruses. If result is PRESUMPTIVE POSTIVE SARS-CoV-2 nucleic acids MAY BE PRESENT.   A presumptive positive result was obtained on the submitted specimen  and confirmed on repeat testing.  While 2019 novel coronavirus  (SARS-CoV-2) nucleic acids may be present in the submitted sample  additional confirmatory testing may be necessary for epidemiological  and / or clinical management purposes  to differentiate between  SARS-CoV-2 and other Sarbecovirus currently known to infect humans.  If clinically indicated additional testing with an alternate test  methodology [phone]) is advised. The SARS-CoV-2 RNA is generally  detectable in upper and lower respiratory sp ecimens during the acute  phase of infection. The expected result is Negative. Fact Sheet for Patients:  StrictlyIdeas.no Fact Sheet for Healthcare Providers: BankingDealers.co.za This test is not yet approved or cleared by the [place] FDA and has been authorized for detection and/or diagnosis of SARS-CoV-2 by FDA under an  Emergency Use Authorization (EUA).  This EUA will remain in effect (meaning this test can be used) for the duration of the COVID-19 declaration under Section 564(b)(1) of the Act, 21 U.S.C. section 360bbb-3(b)(1), unless the authorization is terminated or revoked sooner. Performed at [facility], [place] [address], [place], [place] [address]     Radiological Exams on Admission: Ct Angio Chest Pe W And/or Wo Contrast  Result Date: 01/02/2019 CLINICAL DATA:  Shortness of breath and cough EXAM: CT ANGIOGRAPHY CHEST WITH CONTRAST TECHNIQUE: Multidetector CT imaging of the chest was performed using the standard protocol during bolus administration of intravenous contrast. Multiplanar CT image reconstructions and MIPs were obtained to evaluate the vascular anatomy. CONTRAST:  162m OMNIPAQUE IOHEXOL 350 MG/ML SOLN COMPARISON:  None. FINDINGS: Cardiovascular: Normal heart size. No pericardial effusion. No pulmonary artery filling defect or other vascular abnormality Mediastinum/Nodes: Negative for adenopathy or mass Lungs/Pleura: Clustered airspace opacity in the left lower lobe and in the right upper lobe superimposed on low lung volumes with atelectasis. The opacities are somewhat segmental in distribution and not generalized as usually seen with viral pneumonia. No edema, effusion, or pneumothorax. Upper Abdomen: Negative Musculoskeletal: No acute or aggressive finding Review of the MIP images confirms the above findings. IMPRESSION: 1. Mild pneumonia seen in the left lower and right upper lobes. The left lower lobe opacity has a segmental appearance, favor bacterial pneumonia although coronavirus testing should be considered. 2. Negative for pulmonary embolism. Electronically Signed   By: JMonte FantasiaM.D.   On: 01/02/2019 06:27   Dg Chest Port 1 View  Result Date: 01/02/2019 CLINICAL DATA:  Cough, shortness of breath EXAM: PORTABLE CHEST 1 VIEW COMPARISON:  12/26/2018 FINDINGS: Low  lung volumes. Mild cardiomegaly and vascular congestion. No confluent opacities or effusions. No acute bony abnormality. IMPRESSION: Low lung volumes.  Cardiomegaly, vascular congestion Electronically Signed   By: KRolm BaptiseM.D.   On: 01/02/2019 04:02    EKG: Independently reviewed.  Showed sinus tachycardia rate of 115 with a QTC of 447.  There is no evidence of ST elevation on my interpretation  Assessment/Plan Active Problems:   Low back pain  Hypothyroidism   Asthma   Snoring   Lumbar stenosis with neurogenic claudication   Obesity, Class III, BMI 40-49.9 (morbid obesity) (HCC)   Essential hypertension   HCAP (healthcare-associated pneumonia)   Normocytic anemia   Hyperglycemia   GERD (gastroesophageal reflux disease)  HCAP -Placed in observation telemetry -Patient not hypoxic requiring any supplemental oxygen via nasal cannula -Recently hospitalized and had back surgery and was hospitalized from Wednesday to Sunday and discharged on Sunday last week -Met Sirs criteria and was tachycardic and tachypneic but is afebrile and has no leukocytosis -Started on broad-spectrum antibiotics with IV cefepime and IV vancomycin and will continue for now; will check MRSA PCR screen and discontinue vancomycin if negative -CTA of the chest done and showed a mild pneumonia seen in the left lower lobe and right upper lobes and the left lower lobe opacity is a segmental appearance favoring bacterial pneumonia although stated that coronavirus testing should be considered which is still pending; CTA was also negative for pulmonary embolus -Placed on droplet and contact precautions for now until coronavirus testing results -Started IV fluid hydration with normal saline rate of 75 mL's per hour for 1 day and will repeat chest x-ray in the a.m. -Currently since coronavirus testing is still pending we will avoid nebulized breathing treatments and will place the patient on albuterol inhaler as needed as  well as Combivent inhaler scheduled -Start guaifenesin 1200 mg p.o. 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  -Tolerated procedure well previously and will continue home pain medication regimen with acetaminophen thousand grams p.o. every 6 as needed as well as oxycodone 1 tab 5 mg every 6 as needed for moderate to severe pain  -We will obtain PT and OT evaluation -Sees Dr. Kathyrn Sheriff in 2 weeks  Hypertension -Continue with amlodipine 10 mg p.o. daily  Morbid Obesity -Estimated body mass index is 48.06 kg/m as calculated from the following:   Height as of this encounter: '5\' 4"'  (1.626 m).   Weight as of this encounter: 127 kg. -Weight Loss and Dietary Counseling given  Needs counseling on weight loss and exercise as outpatient.  ? Obstructive sleep apnea -As per her pulmonary during her visit a few weeks.    -Planned on split-night sleep study for screening as an outpatient.  History of asthma with allergies -Resume Home inhalers with albuterol 2 puffs every 6 PRN for wheezing or shortness of breath, mometasone inhaler 2 puffs twice daily, as well as mometasone substitution with fluticasone spray in each nare daily as well as Singulair 10 mg p.o. nightly and Fexofenadine substitution with loratadine  Depression and Anxiety  -Continue with peroxide teen 60 mg p.o. daily along with alprazolam 0.5 mg 3 times daily as needed for anxiety  Normocytic Anemia -Hemoglobin/hematocrit admission was 10.7/34.4 -Check anemia panel in the a.m. -Continue monitor for signs and symptoms of bleeding; currently no overt bleeding noted -Continue monitor and repeat CBC in  the a.m.  Hypothyroidism -Check TSH -Continue with levothyroxine 75 mcg p.o. daily before breakfast  GERD -Continue with omeprazole 20 mg p.o. twice daily substitution with pantoprazole  Hyperglycemia -Patient's blood sugar on admission was 103 and a few days ago discharge was 121 -Check hemoglobin A1c in the  a.m. -Continue monitor and trend blood sugars carefully and if consistently elevated will place on sensitive NovoLog sliding scale insulin before meals  DVT prophylaxis: Enoxaparin 40 mg sq q24h Code Status: FULL CODE Family Communication: No family present at bedside  Disposition Plan: Anticipate D/C Home in the next 24-48 hours after clinical improvement and evaluation by PT/OT Consults called: None Admission status: Obs Telemetry   Severity of Illness: The appropriate patient status for this patient is OBSERVATION. Observation status is judged to be reasonable and necessary in order to provide the required intensity of service to ensure the patient's safety. The patient's presenting symptoms, physical exam findings, and initial radiographic and laboratory data in the context of their medical condition is felt to place them at decreased risk for further clinical deterioration. Furthermore, it is anticipated that the patient will be medically stable for discharge from the hospital within 2 midnights of admission. The following factors support the patient status of observation.   " The patient's presenting symptoms include SOB and Chest Tightness with cough. " The physical exam findings include Diminished breath sounds. " The initial radiographic and laboratory data are concerning for a Pneumonia.  Kerney Elbe, D.O. [facility] is on [place]  If 7PM-7AM, please contact night-coverage [email or web address] Password [email or web address]  01/02/2019, 9:04 AM

## 2019-01-02 NOTE — ED Provider Notes (Signed)
[facility] DEPT Provider Note   CSN: [id number] Arrival date & time: 01/02/19  0205    History   Chief Complaint Chief Complaint  Patient presents with  . Shortness of Breath  . Cough    HPI Carolyn Castro is a 38 y.o. female.     HPI  38 year old female comes in a chief complaint of shortness of breath and cough.  Patient has history of asthma and a recent emergent lumbar spine surgery for cord compression.  Patient reports that over the past 3 to 4 days she has been having some chest discomfort, cough and shortness of breath.  Her chest discomfort is described as tightness.  She has wheezing and a nonproductive cough.  Patient has been taking albuterol with transient relief.  She notes that her symptoms are worse when she is laying flat.  Chest pain is worse with deep inspiration.  Patient has no history of PE, DVT but she does take birth control pills.  She denies any heavy smoking, drinking.  Review of system is negative for any fevers or chills.  Past Medical History:  Diagnosis Date  . Anxiety   . Asthma   . Back pain   . Hypertension   . Hypothyroid     Patient Active Problem List   Diagnosis Date Noted  . Acute paraplegia ([place]) 12/26/2018  . Lumbar stenosis with neurogenic claudication 12/26/2018  . Obesity, Class III, BMI 40-49.9 (morbid obesity) ([place]) 12/26/2018  . Essential hypertension 12/26/2018  . Near syncope 12/26/2018  . Lumbar stenosis 12/26/2018  . Asthma 12/25/2018  . Snoring 12/25/2018  . Low back pain 09/04/2018  . Hypothyroidism 09/04/2018  . Infected incision 07/04/2018  . Adnexal mass 06/29/2018  . Pelvic pain 06/29/2018    Past Surgical History:  Procedure Laterality Date  . BACK SURGERY    . COLONOSCOPY    . LAPAROSCOPIC OVARIAN CYSTECTOMY Left 06/29/2018   Procedure: LAPAROSCOPIC OVARIAN CYSTECTOMY;  Surgeon: Christophe Louis, MD;  Location: [facility];  Service: Gynecology;  Laterality:  Left;  . TONSILLECTOMY    . WISDOM TOOTH EXTRACTION       OB History    Gravida  0   Para  0   Term  0   Preterm  0   AB  0   Living  0     SAB  0   TAB  0   Ectopic  0   Multiple  0   Live Births  0            Home Medications    Prior to Admission medications   Medication Sig Start Date End Date Taking? Authorizing Provider  acetaminophen (TYLENOL) 500 MG tablet Take 1,000 mg by mouth every 6 (six) hours as needed (for pain).    Yes [provider]  albuterol (VENTOLIN HFA) 108 (90 Base) MCG/ACT inhaler Inhale 2 puffs into the lungs every 6 (six) hours as needed for wheezing or shortness of breath.   Yes [provider]  ALPRAZolam (XANAX) 0.5 MG tablet Take 1 tablet (0.5 mg total) by mouth 3 (three) times daily as needed for anxiety. Patient taking differently: Take 0.5 mg by mouth 2 (two) times daily as needed for anxiety.  09/27/18  Yes Cottle, Billey Co., MD  amLODipine (NORVASC) 10 MG tablet Take 10 mg by mouth daily.   Yes [provider]  cholecalciferol (VITAMIN D3) 25 MCG (1000 UT) tablet Take 1,000 Units by mouth  daily.   Yes [provider]  CRANBERRY PO Take 1 tablet by mouth 2 (two) times daily.    Yes [provider]  diphenhydrAMINE (BENADRYL) 25 MG tablet Take 50 mg by mouth as needed for itching or allergies.    Yes [provider]  Docusate Sodium (STOOL SOFTENER) 100 MG capsule Take 100 mg by mouth 2 (two) times daily.   Yes [provider]  fexofenadine (ALLEGRA) 180 MG tablet Take 180 mg by mouth daily.   Yes [provider]  ibuprofen (ADVIL,MOTRIN) 200 MG tablet Take 800 mg by mouth every 6 (six) hours as needed (for pain).    Yes [provider]  levothyroxine (SYNTHROID, LEVOTHROID) 75 MCG tablet Take 75 mcg by mouth daily before breakfast.   Yes [provider]  mometasone (ASMANEX) 220 MCG/INH inhaler Inhale 2 puffs into the lungs 2 (two) times  daily.    Yes [provider]  mometasone (NASONEX) 50 MCG/ACT nasal spray Place 2 sprays into the nose daily.   Yes [provider]  montelukast (SINGULAIR) 10 MG tablet Take 10 mg by mouth every evening.    Yes [provider]  norethindrone-ethinyl estradiol (MICROGESTIN,JUNEL,LOESTRIN) 1-20 MG-MCG tablet Take 1 tablet by mouth daily.   Yes [provider]  omeprazole (PRILOSEC OTC) 20 MG tablet Take 20 mg by mouth 2 (two) times daily.    Yes [provider]  oxyCODONE (OXY IR/ROXICODONE) 5 MG immediate release tablet Take 1 tablet (5 mg total) by mouth every 6 (six) hours as needed for moderate pain or severe pain. 12/30/18  Yes Dhungel, Nishant, MD  PARoxetine (PAXIL) 30 MG tablet Take 2 tablets (60 mg total) by mouth daily. 09/27/18  Yes Cottle, Steva Readyarey G Jr., MD  Probiotic Product (PROBIOTIC DAILY PO) Take 1 tablet by mouth daily.   Yes [provider]  senna (SENOKOT) 8.6 MG TABS tablet Take 1 tablet (8.6 mg total) by mouth daily as needed for mild constipation or moderate constipation. 12/30/18  Yes Dhungel, Theda BelfastNishant, MD    Family History Family History  Problem Relation Age of Onset  . Hypertension Mother   . Hypertension Father     Social History Social History   Tobacco Use  . Smoking status: Never Smoker  . Smokeless tobacco: Never Used  Substance Use Topics  . Alcohol use: Never    Frequency: Never  . Drug use: Never     Allergies   Ciprofloxacin hcl   Review of Systems Review of Systems  Constitutional: Positive for activity change.  Respiratory: Positive for cough and shortness of breath.   Cardiovascular: Positive for chest pain.  Gastrointestinal: Negative for nausea and vomiting.  Allergic/Immunologic: Negative for immunocompromised state.  Hematological: Does not bruise/bleed easily.  All other systems reviewed and are negative.    Physical Exam Updated Vital Signs BP 124/71   Pulse (!) 104   Temp  98.4 F (36.9 C) (Oral)   Resp (!) 23   Ht 5\' 4"  (1.626 m)   Wt 127 kg   LMP 12/13/2018 Comment: negative HCG quantitative 01/02/19  SpO2 97%   BMI 48.06 kg/m   Physical Exam Vitals signs and nursing note reviewed.  Constitutional:      Appearance: She is well-developed.  HENT:     Head: Normocephalic and atraumatic.  Neck:     Musculoskeletal: Normal range of motion and neck supple.  Cardiovascular:     Rate and Rhythm: Tachycardia present.  Pulmonary:  Effort: Pulmonary effort is normal.     Breath sounds: No decreased breath sounds, wheezing, rhonchi or rales.  Abdominal:     General: Bowel sounds are normal.  Musculoskeletal:     Right lower leg: She exhibits no tenderness. Edema present.     Left lower leg: She exhibits no tenderness. Edema present.     Comments: Bilateral 1+ pitting edema  Skin:    General: Skin is warm and dry.  Neurological:     Mental Status: She is alert and oriented to person, place, and time.      ED Treatments / Results  Labs (all labs ordered are listed, but only abnormal results are displayed) Labs Reviewed  BASIC METABOLIC PANEL - Abnormal; Notable for the following components:      Result Value   Glucose, Bld 103 (*)    Calcium 8.5 (*)    All other components within normal limits  CBC WITH DIFFERENTIAL/PLATELET - Abnormal; Notable for the following components:   RBC 3.67 (*)    Hemoglobin 10.7 (*)    HCT 34.4 (*)    Abs Immature Granulocytes 0.12 (*)    All other components within normal limits  SARS CORONAVIRUS 2 (HOSPITAL ORDER, PERFORMED IN [facility])  BRAIN NATRIURETIC PEPTIDE  TROPONIN I (HIGH SENSITIVITY)  HCG, QUANTITATIVE, PREGNANCY  I-STAT BETA HCG BLOOD, ED ([facility], [facility], [facility] ONLY)    EKG EKG Interpretation  Date/Time:  Wednesday January 02 2019 02:40:35 EDT Ventricular Rate:  115 PR Interval:    QRS Duration: 89 QT Interval:  323 QTC Calculation: 447 R Axis:   5 Text Interpretation:  Sinus  tachycardia Low voltage, precordial leads No acute changes s1q3 without t wave inversion Confirmed by Derwood KaplanNanavati, Pradeep Beaubrun [phone]([id number]) on 01/02/2019 3:31:14 AM   Radiology Ct Angio Chest Pe W And/or Wo Contrast  Result Date: 01/02/2019 CLINICAL DATA:  Shortness of breath and cough EXAM: CT ANGIOGRAPHY CHEST WITH CONTRAST TECHNIQUE: Multidetector CT imaging of the chest was performed using the standard protocol during bolus administration of intravenous contrast. Multiplanar CT image reconstructions and MIPs were obtained to evaluate the vascular anatomy. CONTRAST:  100mL OMNIPAQUE IOHEXOL 350 MG/ML SOLN COMPARISON:  None. FINDINGS: Cardiovascular: Normal heart size. No pericardial effusion. No pulmonary artery filling defect or other vascular abnormality Mediastinum/Nodes: Negative for adenopathy or mass Lungs/Pleura: Clustered airspace opacity in the left lower lobe and in the right upper lobe superimposed on low lung volumes with atelectasis. The opacities are somewhat segmental in distribution and not generalized as usually seen with viral pneumonia. No edema, effusion, or pneumothorax. Upper Abdomen: Negative Musculoskeletal: No acute or aggressive finding Review of the MIP images confirms the above findings. IMPRESSION: 1. Mild pneumonia seen in the left lower and right upper lobes. The left lower lobe opacity has a segmental appearance, favor bacterial pneumonia although coronavirus testing should be considered. 2. Negative for pulmonary embolism. Electronically Signed   By: Marnee SpringJonathon  Watts M.D.   On: 01/02/2019 06:27   Dg Chest Port 1 View  Result Date: 01/02/2019 CLINICAL DATA:  Cough, shortness of breath EXAM: PORTABLE CHEST 1 VIEW COMPARISON:  12/26/2018 FINDINGS: Low lung volumes. Mild cardiomegaly and vascular congestion. No confluent opacities or effusions. No acute bony abnormality. IMPRESSION: Low lung volumes.  Cardiomegaly, vascular congestion Electronically Signed   By: Charlett NoseKevin  Dover M.D.   On:  01/02/2019 04:02    Procedures Procedures (including critical care time)  Medications Ordered in ED Medications  sodium chloride (PF) 0.9 % injection (  has no administration in time range)  ceFEPIme (MAXIPIME) 2 g in sodium chloride 0.9 % 100 mL IVPB (2 g Intravenous New Bag/Given 01/02/19 0706)  azithromycin (ZITHROMAX) 500 mg in sodium chloride 0.9 % 250 mL IVPB (has no administration in time range)  vancomycin (VANCOCIN) 2,000 mg in sodium chloride 0.9 % 500 mL IVPB (has no administration in time range)  iohexol (OMNIPAQUE) 350 MG/ML injection 100 mL (100 mLs Intravenous Contrast Given 01/02/19 0556)     Initial Impression / Assessment and Plan / ED Course  I have reviewed the triage vital signs and the nursing notes.  Pertinent labs & imaging results that were available during my care of the patient were reviewed by me and considered in my medical decision making (see chart for details).  Clinical Course as of Jan 01 706  Wed Jan 02, 2019  0706 Labs are overall reassuring.  CT Angio ordered because of the tachycardia, and the results are showing a pneumonia. Given that patient had recent admission for few days because of her spine surgery and she has underlying asthma, she is at slight he high risk of having MDR pneumonia. She is clinically not septic but we will admit her for optimization.  CT Angio Chest PE W and/or Wo Contrast [AN]    Clinical Course User Index [AN] Derwood KaplanNanavati, Myosha Cuadras, MD       38 year old female comes in a chief complaint of shortness of breath, cough and chest tightness.  She has some pleuritic component to her symptoms.  Cough is mostly dry.  No COVID-19 exposures.  She has no history of PE, but she is postop from recent spine surgery and also takes birth control pill.  Differential diagnosis includes PE, ACS, CHF, pneumonia, atelectasis.  Clinically does not seem like asthma exacerbation to me.  Plan is to get basic labs and chest x-ray.  If the x-rays are  reassuring then we will proceed with CT angios.  Troponin ordered because of the leg swelling and possibility of new CHF and completed ACS.  Final Clinical Impressions(s) / ED Diagnoses   Final diagnoses:  HCAP (healthcare-associated pneumonia)    ED Discharge Orders    None       Derwood KaplanNanavati, Dajion [facility], MD 01/02/19 [phone]

## 2019-01-02 NOTE — Plan of Care (Signed)
Pt is out of bed 1 assist. Gait steady

## 2019-01-02 NOTE — ED Notes (Signed)
Bed: PF79 Expected date:  Expected time:  Means of arrival:  Comments: 38 yr old short of breath

## 2019-01-02 NOTE — Progress Notes (Signed)
Attempted peak flow x3.  Pt was sleepy and unable to give good effort.  RN aware, present in room.

## 2019-01-02 NOTE — ED Notes (Signed)
ED TO INPATIENT HANDOFF REPORT  Name/Age/Gender Carolyn CheshireKaren M Castro 38 y.o. female  Code Status Code Status History    Date Active Date Inactive Code Status Order ID Comments User Context   12/26/2018 2020 12/30/2018 1745 Full Code [id number]  Eddie Northhungel, Nishant, MD Inpatient   09/04/2018 2149 09/06/2018 1711 Full Code [id number]  Eduard ClosKakrakandy, Arshad N, MD ED   07/04/2018 0152 07/05/2018 1819 Full Code [id number]  Hartsog, Marry Guanarol A, CNM Inpatient   Advance Care Planning Activity      Home/SNF/Other Home  Chief Complaint SOB and Cough for 3 days  Level of Care/Admitting Diagnosis ED Disposition    ED Disposition Condition Comment   Admit  Hospital Area: [facility] [[id number]]  Level of Care: Telemetry [5]  Admit to tele based on following criteria: Monitor QTC interval  Covid Evaluation: Person Under Investigation (PUI)  Diagnosis: HCAP (healthcare-associated pneumonia) [[id number]][[id number]]  Admitting Physician: Marguerita MerlesSHEIKH, OMAIR LATIF [[id number]]  Attending Physician: Marguerita MerlesSHEIKH, OMAIR LATIF [[id number]]  PT Class (Do Not Modify): Observation [104]  PT Acc Code (Do Not Modify): Observation [10022]       Medical History Past Medical History:  Diagnosis Date  . Anxiety   . Asthma   . Back pain   . Hypertension   . Hypothyroid     Allergies Allergies  Allergen Reactions  . Ciprofloxacin Hcl Diarrhea and Other (See Comments)    Developed C-Diff    IV Location/Drains/Wounds Patient Lines/Drains/Airways Status   Active Line/Drains/Airways    Name:   Placement date:   Placement time:   Site:   Days:   Peripheral IV 01/02/19 Right Antecubital   01/02/19    0311    Antecubital   less than 1   Incision (Closed) 12/28/18 Back   12/28/18    1745     5          Labs/Imaging Results for orders placed or performed during the hospital encounter of 01/02/19 (from the past 48 hour(s))  Basic metabolic panel     Status: Abnormal   Collection Time: 01/02/19  3:29 AM  Result Value Ref Range    Sodium 137 135 - 145 mmol/L   Potassium 3.8 3.5 - 5.1 mmol/L   Chloride 108 98 - 111 mmol/L   CO2 23 22 - 32 mmol/L   Glucose, Bld 103 (H) 70 - 99 mg/dL   BUN 13 6 - 20 mg/dL   Creatinine, Ser 2.720.73 0.44 - 1.00 mg/dL   Calcium 8.5 (L) 8.9 - 10.3 mg/dL   GFR calc non Af Amer >60 >60 mL/min   GFR calc Af Amer >60 >60 mL/min   Anion gap 6 5 - 15    Comment: Performed at [facility], [address] [place], [place] [id number]  CBC with Differential/Platelet     Status: Abnormal   Collection Time: 01/02/19  3:29 AM  Result Value Ref Range   WBC 10.3 4.0 - 10.5 K/uL   RBC 3.67 (L) 3.87 - 5.11 MIL/uL   Hemoglobin 10.7 (L) 12.0 - 15.0 g/dL   HCT 40.334.4 (L) 47.436.0 - 25.946.0 %   MCV 93.7 80.0 - 100.0 fL   MCH 29.2 26.0 - 34.0 pg   MCHC 31.1 30.0 - 36.0 g/dL   RDW 56.313.8 87.511.5 - 64.315.5 %   Platelets 341 150 - 400 K/uL   nRBC 0.0 0.0 - 0.2 %   Neutrophils Relative % 58 %   Neutro Abs 5.9 1.7 - 7.7 K/uL  Lymphocytes Relative 32 %   Lymphs Abs 3.3 0.7 - 4.0 K/uL   Monocytes Relative 8 %   Monocytes Absolute 0.8 0.1 - 1.0 K/uL   Eosinophils Relative 1 %   Eosinophils Absolute 0.1 0.0 - 0.5 K/uL   Basophils Relative 0 %   Basophils Absolute 0.0 0.0 - 0.1 K/uL   Immature Granulocytes 1 %   Abs Immature Granulocytes 0.12 (H) 0.00 - 0.07 K/uL    Comment: Performed at [facility], [address] [place], [place] [address]  Brain natriuretic peptide     Status: None   Collection Time: 01/02/19  3:29 AM  Result Value Ref Range   B Natriuretic Peptide 18.3 0.0 - 100.0 pg/mL    Comment: Performed at [facility], [address], [place], [place] [address]  Troponin I (High Sensitivity)     Status: None   Collection Time: 01/02/19  3:29 AM  Result Value Ref Range   Troponin I (High Sensitivity) 5 <18 ng/L    Comment: (NOTE) Elevated high sensitivity troponin I (hsTnI) values and significant  changes across serial measurements may suggest ACS but  many other  chronic and acute conditions are known to elevate hsTnI results.  Refer to the "Links" section for chest pain algorithms and additional  guidance. Performed at [facility], [address], [place], [place] [address]   hCG, quantitative, pregnancy     Status: None   Collection Time: 01/02/19  3:29 AM  Result Value Ref Range   hCG, Beta Chain, Quant, S 1 <5 mIU/mL    Comment:          GEST. AGE      CONC.  (mIU/mL)   <=1 WEEK        5 - 50     2 WEEKS       50 - 500     3 WEEKS       100 - 10,000     4 WEEKS     1,000 - 30,000     5 WEEKS     3,500 - 115,000   6-8 WEEKS     12,000 - 270,000    12 WEEKS     15,000 - 220,000        FEMALE AND NON-PREGNANT FEMALE:     LESS THAN 5 mIU/mL Performed at [facility], [address] [place], [place] [address]   SARS Coronavirus 2 (CEPHEID- Performed in [facility] hospital lab), Hosp Order     Status: None   Collection Time: 01/02/19  6:54 AM   Specimen: Nasopharyngeal Swab  Result Value Ref Range   SARS Coronavirus 2 NEGATIVE NEGATIVE    Comment: (NOTE) If result is NEGATIVE SARS-CoV-2 target nucleic acids are NOT DETECTED. The SARS-CoV-2 RNA is generally detectable in upper and lower  respiratory specimens during the acute phase of infection. The lowest  concentration of SARS-CoV-2 viral copies this assay can detect is 250  copies / mL. A negative result does not preclude SARS-CoV-2 infection  and should not be used as the sole basis for treatment or other  patient management decisions.  A negative result may occur with  improper specimen collection / handling, submission of specimen other  than nasopharyngeal swab, presence of viral mutation(s) within the  areas targeted by this assay, and inadequate number of viral copies  (<250 copies / mL). A negative result must be combined with clinical  observations, patient  history, and epidemiological information. If result is  POSITIVE SARS-CoV-2 target nucleic acids are DETECTED. The SARS-CoV-2 RNA is generally detectable in upper and lower  respiratory specimens dur ing the acute phase of infection.  Positive  results are indicative of active infection with SARS-CoV-2.  Clinical  correlation with patient history and other diagnostic information is  necessary to determine patient infection status.  Positive results do  not rule out bacterial infection or co-infection with other viruses. If result is PRESUMPTIVE POSTIVE SARS-CoV-2 nucleic acids MAY BE PRESENT.   A presumptive positive result was obtained on the submitted specimen  and confirmed on repeat testing.  While 2019 novel coronavirus  (SARS-CoV-2) nucleic acids may be present in the submitted sample  additional confirmatory testing may be necessary for epidemiological  and / or clinical management purposes  to differentiate between  SARS-CoV-2 and other Sarbecovirus currently known to infect humans.  If clinically indicated additional testing with an alternate test  methodology [phone]) is advised. The SARS-CoV-2 RNA is generally  detectable in upper and lower respiratory sp ecimens during the acute  phase of infection. The expected result is Negative. Fact Sheet for Patients:  StrictlyIdeas.no Fact Sheet for Healthcare Providers: BankingDealers.co.za This test is not yet approved or cleared by the [place] FDA and has been authorized for detection and/or diagnosis of SARS-CoV-2 by FDA under an Emergency Use Authorization (EUA).  This EUA will remain in effect (meaning this test can be used) for the duration of the COVID-19 declaration under Section 564(b)(1) of the Act, 21 U.S.C. section 360bbb-3(b)(1), unless the authorization is terminated or revoked sooner. Performed at [facility], [place] [place], [place] [address]    Ct Angio Chest Pe W And/or Wo  Contrast  Result Date: 01/02/2019 CLINICAL DATA:  Shortness of breath and cough EXAM: CT ANGIOGRAPHY CHEST WITH CONTRAST TECHNIQUE: Multidetector CT imaging of the chest was performed using the standard protocol during bolus administration of intravenous contrast. Multiplanar CT image reconstructions and MIPs were obtained to evaluate the vascular anatomy. CONTRAST:  193mL OMNIPAQUE IOHEXOL 350 MG/ML SOLN COMPARISON:  None. FINDINGS: Cardiovascular: Normal heart size. No pericardial effusion. No pulmonary artery filling defect or other vascular abnormality Mediastinum/Nodes: Negative for adenopathy or mass Lungs/Pleura: Clustered airspace opacity in the left lower lobe and in the right upper lobe superimposed on low lung volumes with atelectasis. The opacities are somewhat segmental in distribution and not generalized as usually seen with viral pneumonia. No edema, effusion, or pneumothorax. Upper Abdomen: Negative Musculoskeletal: No acute or aggressive finding Review of the MIP images confirms the above findings. IMPRESSION: 1. Mild pneumonia seen in the left lower and right upper lobes. The left lower lobe opacity has a segmental appearance, favor bacterial pneumonia although coronavirus testing should be considered. 2. Negative for pulmonary embolism. Electronically Signed   By: Monte Fantasia M.D.   On: 01/02/2019 06:27   Dg Chest Port 1 View  Result Date: 01/02/2019 CLINICAL DATA:  Cough, shortness of breath EXAM: PORTABLE CHEST 1 VIEW COMPARISON:  12/26/2018 FINDINGS: Low lung volumes. Mild cardiomegaly and vascular congestion. No confluent opacities or effusions. No acute bony abnormality. IMPRESSION: Low lung volumes.  Cardiomegaly, vascular congestion Electronically Signed   By: Rolm Baptise M.D.   On: 01/02/2019 04:02    Pending Labs Unresulted Labs (From admission, onward)    Start     Ordered   01/03/19 0500  Vitamin B12  (Anemia Panel (PNL))  Tomorrow morning,   R  01/02/19 0850    01/03/19 0500  Folate  (Anemia Panel (PNL))  Tomorrow morning,   R     01/02/19 0850   01/03/19 0500  Iron and TIBC  (Anemia Panel (PNL))  Tomorrow morning,   R     01/02/19 0850   01/03/19 0500  Ferritin  (Anemia Panel (PNL))  Tomorrow morning,   R     01/02/19 0850   01/03/19 0500  Reticulocytes  (Anemia Panel (PNL))  Tomorrow morning,   R     01/02/19 0850   01/02/19 0842  Troponin I (High Sensitivity)  STAT Now then every 2 hours,   R (with STAT occurrences)    Question Answer Comment  Indication Other   Specify indication Pleuritic CP      01/02/19 0841   Signed and Held  HIV antibody (Routine Screening)  Once,   R     Signed and Held   Signed and Held  Comprehensive metabolic panel  Tomorrow morning,   R     Signed and Held   Signed and Held  Magnesium  Tomorrow morning,   R     Signed and Held   Signed and Held  Phosphorus  Tomorrow morning,   R     Signed and Held   Signed and Held  TSH  Tomorrow morning,   R     Signed and Held   Signed and Held  CBC  Tomorrow morning,   R     Signed and Held   Signed and Held  Hemoglobin A1c  Tomorrow morning,   R     Signed and Held   Signed and Held  Influenza panel by PCR (type A & B)  (Influenza PCR Panel)  Once,   R     Signed and Held   Signed and Held  Culture, sputum-assessment  Once,   R     Signed and Held   Signed and Held  Legionella Pneumophila Serogp 1 Ur Ag  Once,   R     Signed and Held   Signed and Held  Strep pneumoniae urinary antigen  Once,   R     Signed and Held   Signed and Held  MRSA PCR Screening  Once,   R     Signed and Held          Vitals/Pain Today's Vitals   01/02/19 0630 01/02/19 0700 01/02/19 0800 01/02/19 0900  BP: 124/71 125/89 (!) 146/103 129/68  Pulse: (!) 104 (!) 103 (!) 116 (!) 124  Resp: (!) 23 16 (!) 27 (!) 26  Temp:      TempSrc:      SpO2: 97% 97% 98% 97%  Weight:      Height:      PainSc:        Isolation Precautions Airborne and Contact  precautions  Medications Medications  sodium chloride (PF) 0.9 % injection (has no administration in time range)  vancomycin (VANCOCIN) 2,000 mg in sodium chloride 0.9 % 500 mL IVPB (2,000 mg Intravenous New Bag/Given 01/02/19 0855)  ceFEPIme (MAXIPIME) 2 g in sodium chloride 0.9 % 100 mL IVPB (has no administration in time range)  vancomycin (VANCOCIN) 1,250 mg in sodium chloride 0.9 % 250 mL IVPB (has no administration in time range)  levalbuterol (XOPENEX) nebulizer solution 0.63 mg (has no administration in time range)  ipratropium (ATROVENT) nebulizer solution 0.5 mg (has no administration in time range)  iohexol (OMNIPAQUE) 350 MG/ML injection 100  mL (100 mLs Intravenous Contrast Given 01/02/19 0556)  ceFEPIme (MAXIPIME) 2 g in sodium chloride 0.9 % 100 mL IVPB (0 g Intravenous Stopped 01/02/19 0739)  azithromycin (ZITHROMAX) 500 mg in sodium chloride 0.9 % 250 mL IVPB (0 mg Intravenous Stopped 01/02/19 0852)    Mobility non-ambulatory

## 2019-01-03 ENCOUNTER — Observation Stay (HOSPITAL_COMMUNITY): Payer: 59

## 2019-01-03 ENCOUNTER — Other Ambulatory Visit: Payer: Self-pay | Admitting: Gastroenterology

## 2019-01-03 DIAGNOSIS — Z6841 Body Mass Index (BMI) 40.0 and over, adult: Secondary | ICD-10-CM | POA: Diagnosis not present

## 2019-01-03 DIAGNOSIS — Z79890 Hormone replacement therapy (postmenopausal): Secondary | ICD-10-CM | POA: Diagnosis not present

## 2019-01-03 DIAGNOSIS — I10 Essential (primary) hypertension: Secondary | ICD-10-CM | POA: Diagnosis present

## 2019-01-03 DIAGNOSIS — Z793 Long term (current) use of hormonal contraceptives: Secondary | ICD-10-CM | POA: Diagnosis not present

## 2019-01-03 DIAGNOSIS — J45909 Unspecified asthma, uncomplicated: Secondary | ICD-10-CM | POA: Diagnosis present

## 2019-01-03 DIAGNOSIS — J4540 Moderate persistent asthma, uncomplicated: Secondary | ICD-10-CM | POA: Diagnosis not present

## 2019-01-03 DIAGNOSIS — J181 Lobar pneumonia, unspecified organism: Secondary | ICD-10-CM | POA: Diagnosis present

## 2019-01-03 DIAGNOSIS — J189 Pneumonia, unspecified organism: Secondary | ICD-10-CM | POA: Diagnosis present

## 2019-01-03 DIAGNOSIS — K219 Gastro-esophageal reflux disease without esophagitis: Secondary | ICD-10-CM | POA: Diagnosis present

## 2019-01-03 DIAGNOSIS — Y95 Nosocomial condition: Secondary | ICD-10-CM | POA: Diagnosis present

## 2019-01-03 DIAGNOSIS — F329 Major depressive disorder, single episode, unspecified: Secondary | ICD-10-CM | POA: Diagnosis present

## 2019-01-03 DIAGNOSIS — G4733 Obstructive sleep apnea (adult) (pediatric): Secondary | ICD-10-CM | POA: Diagnosis present

## 2019-01-03 DIAGNOSIS — Z20828 Contact with and (suspected) exposure to other viral communicable diseases: Secondary | ICD-10-CM | POA: Diagnosis present

## 2019-01-03 DIAGNOSIS — D509 Iron deficiency anemia, unspecified: Secondary | ICD-10-CM | POA: Diagnosis present

## 2019-01-03 DIAGNOSIS — Z8249 Family history of ischemic heart disease and other diseases of the circulatory system: Secondary | ICD-10-CM | POA: Diagnosis not present

## 2019-01-03 DIAGNOSIS — R739 Hyperglycemia, unspecified: Secondary | ICD-10-CM | POA: Diagnosis present

## 2019-01-03 DIAGNOSIS — E039 Hypothyroidism, unspecified: Secondary | ICD-10-CM | POA: Diagnosis present

## 2019-01-03 DIAGNOSIS — F419 Anxiety disorder, unspecified: Secondary | ICD-10-CM | POA: Diagnosis present

## 2019-01-03 DIAGNOSIS — Z981 Arthrodesis status: Secondary | ICD-10-CM | POA: Diagnosis not present

## 2019-01-03 LAB — FOLATE: Folate: 7.9 ng/mL (ref >5.9)

## 2019-01-03 LAB — COMPREHENSIVE METABOLIC PANEL
ALT: 29 U/L (ref 0–44)
AST: 17 U/L (ref 15–41)
Albumin: 3.1 g/dL — ABNORMAL LOW (ref 3.5–5.0)
Alkaline Phosphatase: 53 U/L (ref 38–126)
Anion gap: 11 (ref 5–15)
BUN: 12 mg/dL (ref 6–20)
CO2: 24 mmol/L (ref 22–32)
Calcium: 8.5 mg/dL — ABNORMAL LOW (ref 8.9–10.3)
Chloride: 103 mmol/L (ref 98–111)
Creatinine, Ser: 0.71 mg/dL (ref 0.44–1.00)
GFR calc Af Amer: >60 mL/min (ref >60)
GFR calc non Af Amer: >60 mL/min (ref >60)
Glucose, Bld: 92 mg/dL (ref 70–99)
Potassium: 4.2 mmol/L (ref 3.5–5.1)
Sodium: 138 mmol/L (ref 135–145)
Total Bilirubin: 0.5 mg/dL (ref 0.3–1.2)
Total Protein: 6.6 g/dL (ref 6.5–8.1)

## 2019-01-03 LAB — PHOSPHORUS: Phosphorus: 3.1 mg/dL (ref 2.5–4.6)

## 2019-01-03 LAB — CBC
HCT: 36 % (ref 36.0–46.0)
Hemoglobin: 11 g/dL — ABNORMAL LOW (ref 12.0–15.0)
MCH: 28.7 pg (ref 26.0–34.0)
MCHC: 30.6 g/dL (ref 30.0–36.0)
MCV: 94 fL (ref 80.0–100.0)
Platelets: 269 10*3/uL (ref 150–400)
RBC: 3.83 MIL/uL — ABNORMAL LOW (ref 3.87–5.11)
RDW: 13.7 % (ref 11.5–15.5)
WBC: 13.2 10*3/uL — ABNORMAL HIGH (ref 4.0–10.5)
nRBC: 0 % (ref 0.0–0.2)

## 2019-01-03 LAB — RETICULOCYTES
Immature Retic Fract: 26.8 % — ABNORMAL HIGH (ref 2.3–15.9)
RBC.: 3.83 MIL/uL — ABNORMAL LOW (ref 3.87–5.11)
Retic Count, Absolute: 109.9 10*3/uL (ref 19.0–186.0)
Retic Ct Pct: 2.9 % (ref 0.4–3.1)

## 2019-01-03 LAB — HEMOGLOBIN A1C
Hgb A1c MFr Bld: 5.6 % (ref 4.8–5.6)
Mean Plasma Glucose: 114.02 mg/dL

## 2019-01-03 LAB — IRON AND TIBC
Iron: 31 ug/dL (ref 28–170)
Saturation Ratios: 7 % — ABNORMAL LOW (ref 10.4–31.8)
TIBC: 433 ug/dL (ref 250–450)
UIBC: 402 ug/dL

## 2019-01-03 LAB — FERRITIN: Ferritin: 17 ng/mL (ref 11–307)

## 2019-01-03 LAB — MAGNESIUM: Magnesium: 2.1 mg/dL (ref 1.7–2.4)

## 2019-01-03 LAB — TSH: TSH: 1.1 u[IU]/mL (ref 0.350–4.500)

## 2019-01-03 LAB — GLUCOSE, CAPILLARY: Glucose-Capillary: 94 mg/dL (ref 70–99)

## 2019-01-03 LAB — VITAMIN B12: Vitamin B-12: 129 pg/mL — ABNORMAL LOW (ref 180–914)

## 2019-01-03 MED ORDER — AZITHROMYCIN 250 MG PO TABS
500.0000 mg | ORAL_TABLET | Freq: Every day | ORAL | Status: AC
  Administered 2019-01-03: 500 mg via ORAL
  Filled 2019-01-03: qty 2

## 2019-01-03 MED ORDER — VITAMIN B-12 1000 MCG PO TABS
1000.0000 ug | ORAL_TABLET | Freq: Every day | ORAL | Status: DC
  Administered 2019-01-04: 1000 ug via ORAL
  Filled 2019-01-03: qty 1

## 2019-01-03 MED ORDER — BISACODYL 5 MG PO TBEC
5.0000 mg | DELAYED_RELEASE_TABLET | Freq: Every day | ORAL | Status: DC | PRN
  Administered 2019-01-03: 5 mg via ORAL
  Filled 2019-01-03: qty 1

## 2019-01-03 MED ORDER — PHENOL 1.4 % MT LIQD
1.0000 | OROMUCOSAL | Status: DC | PRN
  Filled 2019-01-03: qty 177

## 2019-01-03 MED ORDER — FERROUS SULFATE 325 (65 FE) MG PO TABS
325.0000 mg | ORAL_TABLET | Freq: Two times a day (BID) | ORAL | Status: DC
  Administered 2019-01-03 – 2019-01-04 (×2): 325 mg via ORAL
  Filled 2019-01-03 (×2): qty 1

## 2019-01-03 MED ORDER — AZITHROMYCIN 250 MG PO TABS
250.0000 mg | ORAL_TABLET | Freq: Every day | ORAL | Status: DC
  Administered 2019-01-04: 250 mg via ORAL
  Filled 2019-01-03: qty 1

## 2019-01-03 MED ORDER — FLEET ENEMA 7-19 GM/118ML RE ENEM
1.0000 | ENEMA | Freq: Once | RECTAL | Status: AC
  Administered 2019-01-03: 1 via RECTAL
  Filled 2019-01-03: qty 1

## 2019-01-03 MED ORDER — SODIUM CHLORIDE 0.9 % IV SOLN
1.0000 g | INTRAVENOUS | Status: DC
  Administered 2019-01-03: 14:00:00 1 g via INTRAVENOUS
  Filled 2019-01-03: qty 1
  Filled 2019-01-03: qty 10

## 2019-01-03 NOTE — Telephone Encounter (Signed)
Called pt to see if she wants to go ahead and reschedule sleep study that is scheduled for 7/7 or if she wants to leave as is.  Left her a vm to call me when she gets a chance & let me know what she would like to do.

## 2019-01-03 NOTE — Evaluation (Signed)
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 All education has been completed and the patient has no further questions.  Pt mobilizing in room modified independent level.  Pt with SPO2 96% and HR 124 during ambulation.  Pt agreeable to no further needs. See below for any follow-up Physical Therapy or equipment needs. PT is signing off. Thank you for this referral.     Follow Up Recommendations No PT follow up    Equipment Recommendations  None recommended by PT    Recommendations for Other Services       Precautions / Restrictions Precautions Precautions: Fall;Back Required Braces or Orthoses: Spinal Brace Spinal Brace: Applied in sitting position Restrictions Weight Bearing Restrictions: No      Mobility  Bed Mobility               General bed mobility comments: pt up in recliner on arrival  Transfers Overall transfer level: Modified independent                  Ambulation/Gait Ambulation/Gait assistance: Modified independent (Device/Increase time) Gait Distance (Feet): 60 Feet Assistive device: IV Pole Gait Pattern/deviations: WFL(Within Functional Limits)     General Gait Details: mod I with gait, no LOB, pt pushed IV pole however no utilized for support  Network engineer Rankin (Stroke Patients Only)       Balance                                             Pertinent Vitals/Pain Pain Assessment: No/denies pain    Home Living Family/patient expects to be discharged to:: Private residence Living Arrangements: Parent(lives with her mom and dad) Available Help at Discharge: Family Type of Home: House Home Access: Stairs to enter Entrance Stairs-Rails: Right Entrance Stairs-Number of Steps: 4 Home Layout: Multi-level;Able to live on main level with bedroom/bathroom Home Equipment: Shower seat;Adaptive equipment      Prior Function Level of Independence: Needs assistance   Gait / Transfers Assistance Needed: has most recently been ambulating without assist  ADL's / Homemaking Assistance Needed: parents have been assisting with ADL tasks since recent back surgery        Hand Dominance        Extremity/Trunk Assessment        Lower Extremity Assessment Lower Extremity Assessment: Overall WFL for tasks assessed       Communication   Communication: No difficulties  Cognition Arousal/Alertness: Awake/alert Behavior During Therapy: WFL for tasks assessed/performed Overall Cognitive Status: Within Functional Limits for tasks assessed  General Comments      Exercises     Assessment/Plan    PT Assessment Patent does not need any further PT services  PT Problem List         PT Treatment Interventions      PT Goals (Current goals can be found in the Care Plan section)  Acute Rehab PT Goals PT Goal Formulation: All assessment and education complete, DC therapy    Frequency     Barriers to discharge        Co-evaluation               AM-PAC PT "6 Clicks" Mobility  Outcome Measure Help needed turning from your back to your side while in a flat bed without using bedrails?: None Help needed moving from lying on your back  to sitting on the side of a flat bed without using bedrails?: None Help needed moving to and from a bed to a chair (including a wheelchair)?: None Help needed standing up from a chair using your arms (e.g., wheelchair or bedside chair)?: None Help needed to walk in hospital room?: None Help needed climbing 3-5 steps with a railing? : A Little 6 Click Score: 23    End of Session   Activity Tolerance: Patient tolerated treatment well Patient left: in chair;with call bell/phone within reach   PT Visit Diagnosis: Difficulty in walking, not elsewhere classified (R26.2)    Time: [id number] PT Time Calculation (min) (ACUTE ONLY): 22 min   Charges:   PT Evaluation $PT Eval Low Complexity: 1 Low         Zenovia JarredKati Baylen Dea, PT, DPT Acute Rehabilitation Services Office: [phone] Pager: [phone]  Sarajane JewsLEMYRE,KATHrine E 01/03/2019, 3:37 PM

## 2019-01-03 NOTE — Telephone Encounter (Signed)
Pt is still admitted at [place] test was perfomred and the results came back negative.  Pt is scheduled for a home sleep study appt with Iran Sizer, 7/7. Routing this to her as an Juluis Rainier so she is aware that pt is currently in the hospital.

## 2019-01-03 NOTE — Evaluation (Signed)
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 Pt performing functional mobility in room without AD at supervision level, completing toileting, standing grooming ADL and UB ADL at mod-independent-supervision level, currently requires mod-maxA for LB ADL secondary to adhering to back precautions. Pt with slight DOE noted with activity. Pt reports she has necessary AE at home for performing LB ADL tasks and also reports family has been assisting with ADL needs since most recent back surgery, appears to have good support at home. Feel pt is safe to return home once medically ready given available family assist. Education provided including review of back precautions and questions answered throughout session with no further acute OT needs identified; will sign off, thank you for this referral.     Follow Up Recommendations  No OT follow up;Supervision - Intermittent    Equipment Recommendations  None recommended by OT           Precautions / Restrictions Precautions Precautions: Fall;Back Required Braces or Orthoses: Spinal Brace Spinal Brace: Applied in sitting position Restrictions Weight Bearing Restrictions: No      Mobility Bed Mobility Overal bed mobility: Modified Independent             General bed mobility  comments: pt utilizing log roll technique without assist, use of bedrail  Transfers Overall transfer level: Modified independent                    Balance Overall balance assessment: No apparent balance deficits (not formally assessed)                                         ADL either performed or assessed with clinical judgement   ADL Overall ADL's : Needs assistance/impaired Eating/Feeding: Independent   Grooming: Wash/dry hands;Supervision/safety;Standing   Upper Body Bathing: Supervision/ safety;Sitting;Standing   Lower Body Bathing: Moderate assistance;Sit to/from stand Lower Body Bathing Details (indicate cue type and reason): due to adhering to back precautions, reports mother has been assisting Upper Body Dressing : Set up;Sitting;Standing Upper Body Dressing Details (indicate cue type and reason): pt donning lumbar brace without assist, min cues for placement Lower Body Dressing: Moderate assistance;Sit to/from stand Lower Body Dressing Details (indicate cue type and reason): assist for donning socks; supervision for standing balance, requires assist due to adhering to back precautions Toilet Transfer: Supervision/safety;Ambulation;Regular Toilet   Toileting- Water quality scientist and Hygiene: Supervision/safety;Sit to/from stand Toileting - Clothing Manipulation Details (indicate cue type and reason): pt performing clothing management without assist, reports she has a toilet aid from previous admission which she has been using for peri-care     Functional mobility during ADLs: Supervision/safety       Vision         Perception     Praxis      Pertinent Vitals/Pain Pain Assessment: Faces Faces Pain Scale: Hurts  a little bit Pain Location: back Pain Descriptors / Indicators: Discomfort Pain Intervention(s): Monitored during session;Repositioned     Hand Dominance     Extremity/Trunk Assessment Upper Extremity Assessment Upper  Extremity Assessment: Overall WFL for tasks assessed   Lower Extremity Assessment Lower Extremity Assessment: Defer to PT evaluation   Cervical / Trunk Assessment Cervical / Trunk Assessment: Other exceptions Cervical / Trunk Exceptions: recent spinal sx   Communication Communication Communication: No difficulties   Cognition Arousal/Alertness: Awake/alert Behavior During Therapy: WFL for tasks assessed/performed Overall Cognitive Status: Within Functional Limits for tasks assessed                                     General Comments       Exercises     Shoulder Instructions      Home Living Family/patient expects to be discharged to:: Private residence Living Arrangements: Parent(lives with her mom and dad) Available Help at Discharge: Family Type of Home: House Home Access: Stairs to enter Secretary/administratorntrance Stairs-Number of Steps: 4 Entrance Stairs-Rails: Right Home Layout: Multi-level;Able to live on main level with bedroom/bathroom     Bathroom Shower/Tub: Curtain;Walk-in Human resources officershower   Bathroom Toilet: Standard     Home Equipment: Shower seat;Adaptive equipment Adaptive Equipment: Reacher;Sock aid;Long-handled sponge;Other (Comment)(toilet aide)        Prior Functioning/Environment Level of Independence: Needs assistance  Gait / Transfers Assistance Needed: has most recently been ambulating without assist ADL's / Homemaking Assistance Needed: parents have been assisting with ADL tasks since recent back surgery            OT Problem List: Decreased activity tolerance;Cardiopulmonary status limiting activity;Obesity      OT Treatment/Interventions:      OT Goals(Current goals can be found in the care plan section) Acute Rehab OT Goals Patient Stated Goal: home  OT Goal Formulation: All assessment and education complete, DC therapy  OT Frequency:     Barriers to D/C:            Co-evaluation              AM-PAC OT "6 Clicks" Daily  Activity     Outcome Measure Help from another person eating meals?: None Help from another person taking care of personal grooming?: None Help from another person toileting, which includes using toliet, bedpan, or urinal?: None Help from another person bathing (including washing, rinsing, drying)?: A Little Help from another person to put on and taking off regular upper body clothing?: None Help from another person to put on and taking off regular lower body clothing?: A Lot 6 Click Score: 21   End of Session Equipment Utilized During Treatment: Back brace Nurse Communication: Mobility status  Activity Tolerance: Patient tolerated treatment well Patient left: in chair;with call bell/phone within reach  OT Visit Diagnosis: Muscle weakness (generalized) (M62.81)                Time: [id number] OT Time Calculation (min): 18 min Charges:  OT General Charges $OT Visit: 1 Visit OT Evaluation $OT Eval Moderate Complexity: 1 Mod   Marcy SirenBreanna Lakara Weiland, OT [employer] Pager [phone] Office [phone]   Orlando PennerBreanna L Salbador Fiveash 01/03/2019, 3:55 PM

## 2019-01-03 NOTE — Telephone Encounter (Signed)
Patient is still admitted. Will keep this encounter open until she has been discharged so we can get her scheduled for a hosp f/u.

## 2019-01-03 NOTE — Progress Notes (Signed)
PROGRESS NOTE   Carolyn Castro  ZOX:[id number]    DOB: September 17, 1980    DOA: 01/02/2019  PCP: [name], Carol, MD   I have briefly reviewed patients previous medical records in [facility].  Chief Complaint  Patient presents with   Shortness of Breath   Cough    Brief Narrative:  38 year old female with PMH of asthma, anxiety & depression, hypothyroidism, HTN, morbid obesity, recent hospitalization 12/26/2018-12/30/2018 due to acute paraplegia secondary to L5-S1 foraminal stenosis, underwent L5 complete laminectomy with placement of anterior interbody device, discharged home on 12/30/2018, presented to ED 01/02/2019 due to mostly nonproductive cough, progressive dyspnea and wheezing with associated musculoskeletal or pleuritic type of chest pain.  COVID-19 testing negative.  CTA chest was negative for pulmonary embolism but showed mild pneumonia in the left lower and right upper lobes.  Admitted for further management   Assessment & Plan:   Active Problems:   Low back pain   Hypothyroidism   Asthma   Snoring   Lumbar stenosis with neurogenic claudication   Obesity, Class III, BMI 40-49.9 (morbid obesity) (HCC)   Essential hypertension   HCAP (healthcare-associated pneumonia)   Normocytic anemia   Hyperglycemia   GERD (gastroesophageal reflux disease)   Lobar pneumonia (LLL and RUL) Patient quite symptomatic of nonproductive intermittent hacking cough, dyspnea and wheezing on admission without fever or hypoxia. She was tachycardic and tachypneic in ED. CTA chest was negative for PE but showed pneumonia findings as noted above. BNP normal.  Troponin cycled and negative. Empirically started on IV vancomycin and cefepime. MRSA PCR negative. Dyspnea improved but still has significant dyspnea and breathing is not yet at baseline.  Thereby will continue inpatient IV antibiotics at least for another 24 hours. DC vancomycin and cefepime and start IV ceftriaxone and azithromycin.  S/p  L5 complete laminectomy with placement of anterior interbody device This was during recent hospitalization for acute paraplegia. She has clinically improved with improved incisional pain, able to ambulate at home with improved strength in her lower extremities. Surgical wound seems to be healing well. Outpatient follow-up with neurosurgery.  Essential hypertension Controlled on amlodipine 10 mg daily, continue  Morbid obesity/Body mass index is 50.37 kg/m.  Suspected OSA Patient reports that she has an upcoming sleep study with [facility] pul[facility]ology in about 3 weeks.  Anxiety & depression Continue Paxil and as needed alprazolam.  Hypothyroid TSH normal.  Continue Synthroid.  GERD Stable on PPI, continue  Asthma Stable without clinical bronchospasm.  Iron deficiency anemia Anemia panel: Iron 31, TIBC 433, saturation ratio 0, ferritin 17, folate 7.9 and B12: 129. Start oral iron and B12 supplements. Stable.   DVT prophylaxis: Lovenox Code Status: Full Family Communication: None at bedside Disposition: Patient was admitted yesterday due to worsening cough, progressive dyspnea with associated wheezing and chest pain.  Although her symptoms are mildly improved, she still has significant dyspnea and her breathing is not back to baseline.  Thereby she warrants close inpatient monitoring and treatment with IV antibiotics at least for another day.  Hence will transition to inpatient status.   Consultants:  None  Procedures:  None  Antimicrobials:  Cefepime & vancomycin 7/1-7/2 IV ceftriaxone 7/2 > Oral azithromycin 7/2 >   Subjective: Seen interviewed and examined along with her RN in room.  Dry hacking cough and dyspnea have somewhat improved but breathing is not yet at baseline.  No chest pain reported.  Denies fever or chills.  No sick contacts at home.  Postop low back  soreness gradually improving and improving strength in lower extremities.  ROS: Denies dizziness or  lightheadedness.  Has leg edema for which she uses compressive stockings at home.  Objective:  Vitals:   01/02/19 1930 01/02/19 2050 01/03/19 0611 01/03/19 0800  BP:  116/73 97/67   Pulse:  (!) 105 (!) 102   Resp:  18 18   Temp:  98 F (36.7 C) 98.3 F (36.8 C)   TempSrc:  Oral    SpO2: 95% 95% 98% 97%  Weight:   133.1 kg   Height:        Examination:  General exam: Pleasant young female, moderately built and morbidly obese lying comfortably supine in bed. Respiratory system: Occasional basal crackles but otherwise clear to auscultation without wheezing or rhonchi. Respiratory effort normal. Cardiovascular system: S1 & S2 heard, RRR. No JVD, murmurs, rubs, gallops or clicks.  Trace bilateral leg edema.  Telemetry personally reviewed: Mild ST in the 100-1 tens. Gastrointestinal system: Abdomen is nondistended, soft and nontender. No organomegaly or masses felt. Normal bowel sounds heard. Central nervous system: Alert and oriented. No focal neurological deficits. Extremities: Symmetric 5 x 5 power. Skin: Lumbar midline surgical incision site healing well without acute findings. Psychiatry: Judgement and insight appear normal. Mood & affect appropriate.     Data Reviewed: I have personally reviewed following labs and imaging studies  CBC: Recent Labs  Lab 12/29/18 0349 01/02/19 0329 01/03/19 0518  WBC 14.7* 10.3 13.2*  NEUTROABS  --  5.9  --   HGB 12.0 10.7* 11.0*  HCT 37.6 34.4* 36.0  MCV 89.3 93.7 94.0  PLT 417* 341 269   Basic Metabolic Panel: Recent Labs  Lab 12/29/18 0349 01/02/19 0329 01/03/19 0518  NA 135 137 138  K 3.8 3.8 4.2  CL 100 108 103  CO2 23 23 24   GLUCOSE 121* 103* 92  BUN 14 13 12   CREATININE 0.91 0.73 0.71  CALCIUM 8.4* 8.5* 8.5*  MG  --   --  2.1  PHOS  --   --  3.1   Liver Function Tests: Recent Labs  Lab 01/03/19 0518  AST 17  ALT 29  ALKPHOS 53  BILITOT 0.5  PROT 6.6  ALBUMIN 3.1*   Coagulation Profile: Recent Labs  Lab  12/29/18 0349  INR 1.0   Cardiac Enzymes: No results for input(s): CKTOTAL, CKMB, CKMBINDEX, TROPONINI in the last 168 hours. HbA1C: Recent Labs    01/03/19 0518  HGBA1C 5.6   CBG: Recent Labs  Lab 01/03/19 0835  GLUCAP 94    Recent Results (from the past 240 hour(s))  SARS Coronavirus 2 (CEPHEID - Performed in [facility] hospital lab), Hosp Order     Status: None   Collection Time: 12/26/18  1:00 PM   Specimen: Nasopharyngeal Swab  Result Value Ref Range Status   SARS Coronavirus 2 NEGATIVE NEGATIVE Final    Comment: (NOTE) If result is NEGATIVE SARS-CoV-2 target nucleic acids are NOT DETECTED. The SARS-CoV-2 RNA is generally detectable in upper and lower  respiratory specimens during the acute phase of infection. The lowest  concentration of SARS-CoV-2 viral copies this assay can detect is 250  copies / mL. A negative result does not preclude SARS-CoV-2 infection  and should not be used as the sole basis for treatment or other  patient management decisions.  A negative result may occur with  improper specimen collection / handling, submission of specimen other  than nasopharyngeal swab, presence of viral mutation(s) within the  areas targeted by this assay, and inadequate number of viral copies  (<250 copies / mL). A negative result must be combined with clinical  observations, patient history, and epidemiological information. If result is POSITIVE SARS-CoV-2 target nucleic acids are DETECTED. The SARS-CoV-2 RNA is generally detectable in upper and lower  respiratory specimens dur ing the acute phase of infection.  Positive  results are indicative of active infection with SARS-CoV-2.  Clinical  correlation with patient history and other diagnostic information is  necessary to determine patient infection status.  Positive results do  not rule out bacterial infection or co-infection with other viruses. If result is PRESUMPTIVE POSTIVE SARS-CoV-2 nucleic acids MAY BE  PRESENT.   A presumptive positive result was obtained on the submitted specimen  and confirmed on repeat testing.  While 2019 novel coronavirus  (SARS-CoV-2) nucleic acids may be present in the submitted sample  additional confirmatory testing may be necessary for epidemiological  and / or clinical management purposes  to differentiate between  SARS-CoV-2 and other Sarbecovirus currently known to infect humans.  If clinically indicated additional testing with an alternate test  methodology [phone]) is advised. The SARS-CoV-2 RNA is generally  detectable in upper and lower respiratory sp ecimens during the acute  phase of infection. The expected result is Negative. Fact Sheet for Patients:  BoilerBrush.com.cy Fact Sheet for Healthcare Providers: https://pope.com/ This test is not yet approved or cleared by the [place] FDA and has been authorized for detection and/or diagnosis of SARS-CoV-2 by FDA under an Emergency Use Authorization (EUA).  This EUA will remain in effect (meaning this test can be used) for the duration of the COVID-19 declaration under Section 564(b)(1) of the Act, 21 U.S.C. section 360bbb-3(b)(1), unless the authorization is terminated or revoked sooner. Performed at [facility], [address], [place], [place] [address]   MRSA PCR Screening     Status: None   Collection Time: 12/28/18 12:54 PM   Specimen: Nasopharyngeal  Result Value Ref Range Status   MRSA by PCR NEGATIVE NEGATIVE Final    Comment:        The GeneXpert MRSA Assay (FDA approved for NASAL specimens only), is one component of a comprehensive MRSA colonization surveillance program. It is not intended to diagnose MRSA infection nor to guide or monitor treatment for MRSA infections. Performed at [facility], [address] [place], [place] [address]   SARS Coronavirus 2 (CEPHEID- Performed in [facility] hospital lab), Hosp  Order     Status: None   Collection Time: 01/02/19  6:54 AM   Specimen: Nasopharyngeal Swab  Result Value Ref Range Status   SARS Coronavirus 2 NEGATIVE NEGATIVE Final    Comment: (NOTE) If result is NEGATIVE SARS-CoV-2 target nucleic acids are NOT DETECTED. The SARS-CoV-2 RNA is generally detectable in upper and lower  respiratory specimens during the acute phase of infection. The lowest  concentration of SARS-CoV-2 viral copies this assay can detect is 250  copies / mL. A negative result does not preclude SARS-CoV-2 infection  and should not be used as the sole basis for treatment or other  patient management decisions.  A negative result may occur with  improper specimen collection / handling, submission of specimen other  than nasopharyngeal swab, presence of viral mutation(s) within the  areas targeted by this assay, and inadequate number of viral copies  (<250 copies / mL). A negative result must be combined with clinical  observations, patient history, and epidemiological information.  If result is POSITIVE SARS-CoV-2 target nucleic acids are DETECTED. The SARS-CoV-2 RNA is generally detectable in upper and lower  respiratory specimens dur ing the acute phase of infection.  Positive  results are indicative of active infection with SARS-CoV-2.  Clinical  correlation with patient history and other diagnostic information is  necessary to determine patient infection status.  Positive results do  not rule out bacterial infection or co-infection with other viruses. If result is PRESUMPTIVE POSTIVE SARS-CoV-2 nucleic acids MAY BE PRESENT.   A presumptive positive result was obtained on the submitted specimen  and confirmed on repeat testing.  While 2019 novel coronavirus  (SARS-CoV-2) nucleic acids may be present in the submitted sample  additional confirmatory testing may be necessary for epidemiological  and / or clinical management purposes  to differentiate between  SARS-CoV-2  and other Sarbecovirus currently known to infect humans.  If clinically indicated additional testing with an alternate test  methodology [phone]([id number]) is advised. The SARS-CoV-2 RNA is generally  detectable in upper and lower respiratory sp ecimens during the acute  phase of infection. The expected result is Negative. Fact Sheet for Patients:  BoilerBrush.com.cyhttps://www.fda.gov/media/136312/download Fact Sheet for Healthcare Providers: https://pope.com/https://www.fda.gov/media/136313/download This test is not yet approved or cleared by the [place] FDA and has been authorized for detection and/or diagnosis of SARS-CoV-2 by FDA under an Emergency Use Authorization (EUA).  This EUA will remain in effect (meaning this test can be used) for the duration of the COVID-19 declaration under Section 564(b)(1) of the Act, 21 U.S.C. section 360bbb-3(b)(1), unless the authorization is terminated or revoked sooner. Performed at [facility], [address] [place], [place] [address]   MRSA PCR Screening     Status: None   Collection Time: 01/02/19  2:18 PM   Specimen: Nasal Mucosa; Nasopharyngeal  Result Value Ref Range Status   MRSA by PCR NEGATIVE NEGATIVE Final    Comment:        The GeneXpert MRSA Assay (FDA approved for NASAL specimens only), is one component of a comprehensive MRSA colonization surveillance program. It is not intended to diagnose MRSA infection nor to guide or monitor treatment for MRSA infections. Performed at [facility], [address] [place], [place] [address]          Radiology Studies: X-ray Chest Pa And Lateral  Result Date: 01/03/2019 CLINICAL DATA:  Shortness of breath. EXAM: CHEST - 2 VIEW COMPARISON:  CT chest and chest x-ray from yesterday. FINDINGS: Borderline cardiomegaly. Normal mediastinal contours. Normal pulmonary vascularity. Low lung volumes. Ground-glass airspace disease in the right upper and left lower lobes seen on CT are not  well identified by x-ray. No focal consolidation, pleural effusion, or pneumothorax. No acute osseous abnormality. IMPRESSION: 1. Right upper and left lower lobe airspace disease seen on CT is not well identified by x-ray. Electronically Signed   By: Obie DredgeWilliam T Derry M.D.   On: 01/03/2019 09:43   Ct Angio Chest Pe W And/or Wo Contrast  Result Date: 01/02/2019 CLINICAL DATA:  Shortness of breath and cough EXAM: CT ANGIOGRAPHY CHEST WITH CONTRAST TECHNIQUE: Multidetector CT imaging of the chest was performed using the standard protocol during bolus administration of intravenous contrast. Multiplanar CT image reconstructions and MIPs were obtained to evaluate the vascular anatomy. CONTRAST:  100mL OMNIPAQUE IOHEXOL 350 MG/ML SOLN COMPARISON:  None. FINDINGS: Cardiovascular: Normal heart size. No pericardial effusion. No pulmonary artery filling defect or other vascular abnormality Mediastinum/Nodes: Negative for adenopathy or mass Lungs/Pleura: Clustered airspace  opacity in the left lower lobe and in the right upper lobe superimposed on low lung volumes with atelectasis. The opacities are somewhat segmental in distribution and not generalized as usually seen with viral pneumonia. No edema, effusion, or pneumothorax. Upper Abdomen: Negative Musculoskeletal: No acute or aggressive finding Review of the MIP images confirms the above findings. IMPRESSION: 1. Mild pneumonia seen in the left lower and right upper lobes. The left lower lobe opacity has a segmental appearance, favor bacterial pneumonia although coronavirus testing should be considered. 2. Negative for pulmonary embolism. Electronically Signed   By: Monte Fantasia M.D.   On: 01/02/2019 06:27   Dg Chest Port 1 View  Result Date: 01/02/2019 CLINICAL DATA:  Cough, shortness of breath EXAM: PORTABLE CHEST 1 VIEW COMPARISON:  12/26/2018 FINDINGS: Low lung volumes. Mild cardiomegaly and vascular congestion. No confluent opacities or effusions. No acute bony  abnormality. IMPRESSION: Low lung volumes.  Cardiomegaly, vascular congestion Electronically Signed   By: Rolm Baptise M.D.   On: 01/02/2019 04:02        Scheduled Meds:  acidophilus  1 capsule Oral Daily   amLODipine  10 mg Oral Daily   budesonide (PULMICORT) nebulizer solution  0.25 mg Nebulization BID   cholecalciferol  1,000 Units Oral Daily   docusate sodium  100 mg Oral BID   enoxaparin (LOVENOX) injection  60 mg Subcutaneous Q24H   fluticasone  1 spray Each Nare Daily   guaiFENesin  1,200 mg Oral BID   ipratropium  0.5 mg Nebulization TID   levalbuterol  0.63 mg Nebulization TID   levothyroxine  75 mcg Oral QAC breakfast   loratadine  10 mg Oral Daily   montelukast  10 mg Oral QPM   pantoprazole  40 mg Oral BID   PARoxetine  60 mg Oral Daily   Continuous Infusions:  ceFEPime (MAXIPIME) IV 2 g (01/03/19 0900)   vancomycin 1,250 mg (01/03/19 1132)     LOS: 0 days     Vernell Leep, MD, FACP, [facility]. [facility]  To contact the attending provider between 7A-7P or the covering provider during after hours 7P-7A, please log into the web site [email or web address] and access using universal [place] password for that web site. If you do not have the password, please call the hospital operator.  01/03/2019, 12:27 PM

## 2019-01-03 NOTE — Telephone Encounter (Signed)
Pt called & left me vm that she wants to keep home sleep study appt scheduled for 7/7.  Nothing further needed from me.

## 2019-01-03 NOTE — Telephone Encounter (Signed)
Thank you, I'll review her films.  Please make sure we have followup visit arranged. Thanks

## 2019-01-03 NOTE — Progress Notes (Signed)
OT Cancellation Note  Patient Details Name: Carolyn Castro MRN: [id number] DOB: Aug 14, 1980   Cancelled Treatment:    Reason Eval/Treat Not Completed: Patient at procedure or test/ unavailable; (currently off unit). Will follow up for OT eval as schedule permits.  Lou Cal, OT [facility] Pager [phone] Office [phone]   Raymondo Band 01/03/2019, 9:43 AM

## 2019-01-04 LAB — LEGIONELLA PNEUMOPHILA SEROGP 1 UR AG: L. pneumophila Serogp 1 Ur Ag: NEGATIVE

## 2019-01-04 LAB — HIV ANTIBODY (ROUTINE TESTING W REFLEX): HIV Screen 4th Generation wRfx: NONREACTIVE

## 2019-01-04 MED ORDER — FERROUS SULFATE 325 (65 FE) MG PO TABS: 325.0000 mg | ORAL_TABLET | Freq: Two times a day (BID) | ORAL | 0 refills | Status: DC

## 2019-01-04 MED ORDER — LEVALBUTEROL HCL 0.63 MG/3ML IN NEBU: 0.6300 mg | INHALATION_SOLUTION | Freq: Two times a day (BID) | RESPIRATORY_TRACT | Status: DC

## 2019-01-04 MED ORDER — CEFPODOXIME PROXETIL 200 MG PO TABS: 200.0000 mg | ORAL_TABLET | Freq: Two times a day (BID) | ORAL | 0 refills | Status: AC

## 2019-01-04 MED ORDER — CYANOCOBALAMIN 1000 MCG PO TABS: 1000.0000 ug | ORAL_TABLET | Freq: Every day | ORAL | 0 refills | Status: DC

## 2019-01-04 MED ORDER — IPRATROPIUM BROMIDE 0.02 % IN SOLN: 0.5000 mg | Freq: Two times a day (BID) | RESPIRATORY_TRACT | Status: DC

## 2019-01-04 MED ORDER — AZITHROMYCIN 250 MG PO TABS: 250.0000 mg | ORAL_TABLET | Freq: Every day | ORAL | 0 refills | Status: AC

## 2019-01-04 MED ORDER — SODIUM CHLORIDE 0.9 % IV SOLN
2.0000 g | INTRAVENOUS | Status: DC
  Filled 2019-01-04: qty 20

## 2019-01-04 NOTE — Progress Notes (Signed)
Oxygen saturations sustained in the high 90's while ambulating in hall on RA.

## 2019-01-04 NOTE — Discharge Summary (Signed)
Physician Discharge Summary  Carolyn Castro RUE:[id number] DOB: March 14, 1981  PCP: [name], Carol, MD  Admit date: 01/02/2019 Discharge date: 01/04/2019  Recommendations for Outpatient Follow-up:  1. Dr. Shirlean Mylararol Webb, PCP in 1 week with repeat labs (CBC & BMP) 2. Dr. Wendee BeaversNilesh Nundkumar, Neurosurgery: Patient advised to keep prior postop follow-up appointment. 3. [place] pul[facility]ology: Patient has appointment for 01/08/2023 sleep study.  Home Health: None Equipment/Devices: None  Discharge Condition: Improved and stable CODE STATUS: Full Diet recommendation: Heart healthy diet  Discharge Diagnoses:  Active Problems:   Low back pain   Hypothyroidism   Asthma   Snoring   Lumbar stenosis with neurogenic claudication   Obesity, Class III, BMI 40-49.9 (morbid obesity) (HCC)   Essential hypertension   HCAP (healthcare-associated pneumonia)   Normocytic anemia   Hyperglycemia   GERD (gastroesophageal reflux disease)   Lobar pneumonia (HCC)   Brief Summary: 38 year old female with PMH of asthma, anxiety & depression, hypothyroidism, HTN, morbid obesity, recent hospitalization 12/26/2018-12/30/2018 due to acute paraplegia secondary to L5-S1 foraminal stenosis, underwent L5 complete laminectomy with placement of anterior interbody device, discharged home on 12/30/2018, presented to ED 01/02/2019 due to mostly nonproductive cough, progressive dyspnea and wheezing with associated musculoskeletal or pleuritic type of chest pain.  COVID-19 testing negative.  CTA chest was negative for pulmonary embolism but showed mild pneumonia in the left lower and right upper lobes.  Admitted for further management   Assessment & Plan:  Lobar pneumonia (LLL and RUL) Patient quite symptomatic of nonproductive intermittent hacking cough, dyspnea and wheezing on admission without fever or hypoxia. She was tachycardic and tachypneic in ED. CTA chest was negative for PE but showed pneumonia findings as noted above. BNP  normal.  Troponin cycled and negative. Initially was on IV vancomycin and cefepime, MRSA PCR was negative, she was then switched to IV ceftriaxone and oral azithromycin.  She has thus far completed 2 days of IV antibiotics.  At discharge she will be transitioned to complete an additional 3 days of cefpodoxime and azithromycin. 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 Clinically improved and stable.  Not hypoxic even with activity today. May consider repeating chest x-ray in 4 weeks to ensure resolution of abnormal findings.  S/p L5 complete laminectomy with placement of anterior interbody device This was during recent hospitalization for acute paraplegia. She has clinically improved with improved incisional pain, able to ambulate at home with improved strength in her lower extremities. Surgical wound seems to be healing well. Outpatient follow-up with neurosurgery. PT and OT evaluated and do not see any home therapy needs.  Essential hypertension Controlled on amlodipine 10 mg daily, continue  Morbid obesity/Body mass index is 50.37 kg/m. Registered dietitian was consulted to counsel patient regarding diet but patient declined and preferred to do this as outpatient.  Suspected OSA Patient has upcoming sleep study appointment which she plans to keep.  Anxiety & depression Continue Paxil and as needed alprazolam.  Stable.  Hypothyroid TSH normal.  Continue Synthroid.  GERD Stable on PPI, continue  Asthma Stable without clinical bronchospasm.  Continue home regimen.  Iron deficiency anemia Anemia panel: Iron 31, TIBC 433, saturation ratio 7, ferritin 17, folate 7.9 and B12: 129. Started oral iron and B12 supplements. Consider repeating B12 levels and ferritin levels in a couple of weeks to assess response.   Consultants:  None  Procedures:  None   Discharge Instructions  Discharge Instructions  Call MD for:  difficulty breathing, headache or visual disturbances   Complete by: As directed    Call MD for:  extreme fatigue   Complete by: As directed    Call MD for:  persistant dizziness or light-headedness   Complete by: As directed    Call MD for:  redness, tenderness, or signs of infection (pain, swelling, redness, odor or green/yellow discharge around incision site)   Complete by: As directed    Call MD for:  severe uncontrolled pain   Complete by: As directed    Call MD for:  temperature >100.4   Complete by: As directed    Diet - low sodium heart healthy   Complete by: As directed    Discharge instructions   Complete by: As directed    Acetaminophen/Tylenol dose from all sources not to exceed 4 g/day.   Increase activity slowly   Complete by: As directed        Medication List    TAKE these medications   acetaminophen 500 MG tablet Commonly known as: TYLENOL Take 1,000 mg by mouth every 6 (six) hours as needed (for pain).   ALPRAZolam 0.5 MG tablet Commonly known as: Xanax Take 1 tablet (0.5 mg total) by mouth 3 (three) times daily as needed for anxiety. What changed: when to take this   amLODipine 10 MG tablet Commonly known as: NORVASC Take 10 mg by mouth daily.   azithromycin 250 MG tablet Commonly known as: ZITHROMAX Take 1 tablet (250 mg total) by mouth daily for 3 days. Start taking on: January 05, 2019   cefpodoxime 200 MG tablet Commonly known as: VANTIN Take 1 tablet (200 mg total) by mouth 2 (two) times daily for 3 days.   cholecalciferol 25 MCG (1000 UT) tablet Commonly known as: VITAMIN D3 Take 1,000 Units by mouth daily.   CRANBERRY PO Take 1 tablet by mouth 2 (two) times daily.   cyanocobalamin 1000 MCG tablet Take 1 tablet (1,000 mcg total) by mouth daily. Start taking on: January 05, 2019   diphenhydrAMINE 25 MG tablet Commonly known as: BENADRYL Take 50 mg by mouth as needed for itching or allergies.   ferrous sulfate 325 (65 FE)  MG tablet Take 1 tablet (325 mg total) by mouth 2 (two) times daily with a meal.   fexofenadine 180 MG tablet Commonly known as: ALLEGRA Take 180 mg by mouth daily.   ibuprofen 200 MG tablet Commonly known as: ADVIL Take 800 mg by mouth every 6 (six) hours as needed (for pain).   levothyroxine 75 MCG tablet Commonly known as: SYNTHROID Take 75 mcg by mouth daily before breakfast.   mometasone 220 MCG/INH inhaler Commonly known as: ASMANEX Inhale 2 puffs into the lungs 2 (two) times daily.   mometasone 50 MCG/ACT nasal spray Commonly known as: NASONEX Place 2 sprays into the nose daily.   montelukast 10 MG tablet Commonly known as: SINGULAIR Take 10 mg by mouth every evening.   norethindrone-ethinyl estradiol 1-20 MG-MCG tablet Commonly known as: LOESTRIN Take 1 tablet by mouth daily.   omeprazole 20 MG tablet Commonly known as: PRILOSEC OTC Take 20 mg by mouth 2 (two) times daily.   oxyCODONE 5 MG immediate release tablet Commonly known as: Oxy IR/ROXICODONE Take 1 tablet (5 mg total) by mouth every 6 (six) hours as needed for moderate pain or severe pain.   PARoxetine 30 MG tablet Commonly known as: PAXIL Take 2 tablets (60 mg total) by mouth daily.   PROBIOTIC  DAILY PO Take 1 tablet by mouth daily.   senna 8.6 MG Tabs tablet Commonly known as: SENOKOT Take 1 tablet (8.6 mg total) by mouth daily as needed for mild constipation or moderate constipation.   Stool Softener 100 MG capsule Generic drug: Docusate Sodium Take 100 mg by mouth 2 (two) times daily.   Ventolin HFA 108 (90 Base) MCG/ACT inhaler Generic drug: albuterol Inhale 2 puffs into the lungs every 6 (six) hours as needed for wheezing or shortness of breath.      Follow-up Information    [name], Carol, MD. Schedule an appointment as soon as possible for a visit in 1 week(s).   Specialty: Family Medicine Why: To be seen with repeat labs (CBC & BMP). Contact information: [address] [place] [address] [phone]        Lisbeth RenshawNundkumar, Neelesh, MD Follow up.   Specialty: Neurosurgery Why: Keep prior follow-up appointment. Contact information: [address] [place] [address] [phone]          Allergies  Allergen Reactions  . Ciprofloxacin Hcl Diarrhea and Other (See Comments)    Developed C-Diff      Procedures/Studies: X-ray Chest Pa And Lateral  Result Date: 01/03/2019 CLINICAL DATA:  Shortness of breath. EXAM: CHEST - 2 VIEW COMPARISON:  CT chest and chest x-ray from yesterday. FINDINGS: Borderline cardiomegaly. Normal mediastinal contours. Normal pulmonary vascularity. Low lung volumes. Ground-glass airspace disease in the right upper and left lower lobes seen on CT are not well identified by x-ray. No focal consolidation, pleural effusion, or pneumothorax. No acute osseous abnormality. IMPRESSION: 1. Right upper and left lower lobe airspace disease seen on CT is not well identified by x-ray. Electronically Signed   By: Obie DredgeWilliam T Derry M.D.   On: 01/03/2019 09:43   Ct Angio Chest Pe W And/or Wo Contrast  Result Date: 01/02/2019 CLINICAL DATA:  Shortness of breath and cough EXAM: CT ANGIOGRAPHY CHEST WITH CONTRAST TECHNIQUE: Multidetector CT imaging of the chest was performed using the standard protocol during bolus administration of intravenous contrast. Multiplanar CT image reconstructions and MIPs were obtained to evaluate the vascular anatomy. CONTRAST:  100mL OMNIPAQUE IOHEXOL 350 MG/ML SOLN COMPARISON:  None. FINDINGS: Cardiovascular: Normal heart size. No pericardial effusion. No pulmonary artery filling defect or other vascular abnormality Mediastinum/Nodes: Negative for adenopathy or mass Lungs/Pleura: Clustered airspace opacity in the left lower lobe and in the right upper lobe superimposed on low lung volumes with atelectasis. The opacities are somewhat segmental in distribution and not generalized as usually  seen with viral pneumonia. No edema, effusion, or pneumothorax. Upper Abdomen: Negative Musculoskeletal: No acute or aggressive finding Review of the MIP images confirms the above findings. IMPRESSION: 1. Mild pneumonia seen in the left lower and right upper lobes. The left lower lobe opacity has a segmental appearance, favor bacterial pneumonia although coronavirus testing should be considered. 2. Negative for pulmonary embolism. Electronically Signed   By: Marnee SpringJonathon  Watts M.D.   On: 01/02/2019 06:27   Dg Chest Port 1 View  Result Date: 01/02/2019 CLINICAL DATA:  Cough, shortness of breath EXAM: PORTABLE CHEST 1 VIEW COMPARISON:  12/26/2018 FINDINGS: Low lung volumes. Mild cardiomegaly and vascular congestion. No confluent opacities or effusions. No acute bony abnormality. IMPRESSION: Low lung volumes.  Cardiomegaly, vascular congestion Electronically Signed   By: Charlett NoseKevin  Dover M.D.   On: 01/02/2019 04:02   Subjective: Denies complaints.  Feels much better.  Eager to go home.  Feels that  she is almost back to her usual baseline.  Denies dyspnea, wheezing, chest tightness, fever or chills.  Cough slightly more productive but clear sputum.  Had a large BM after enema overnight.  No GI symptoms reported.  As per RN, ambulated halls without difficulty and was not hypoxic even on room air.  Discharge Exam:  Vitals:   01/04/19 0045 01/04/19 0430 01/04/19 0623 01/04/19 0802  BP: 128/78 140/86    Pulse: 96 100    Resp: 20 18    Temp: 98 F (36.7 C) 97.9 F (36.6 C)    TempSrc: Oral Oral    SpO2: 96% 100%  95%  Weight:   132.2 kg   Height:        General exam: Pleasant young female, moderately built and morbidly obese lying comfortably supine in bed. Respiratory system:  Clear to auscultation.  No increased work of breathing. Cardiovascular system: S1 & S2 heard, RRR. No JVD, murmurs, rubs, gallops or clicks.  Trace bilateral leg edema.  Gastrointestinal system: Abdomen is nondistended, soft and  nontender. No organomegaly or masses felt. Normal bowel sounds heard. Central nervous system: Alert and oriented. No focal neurological deficits. Extremities: Symmetric 5 x 5 power. Skin: Lumbar midline surgical incision site healing well without acute findings. Psychiatry: Judgement and insight appear normal. Mood & affect appropriate.     The results of significant diagnostics from this hospitalization (including imaging, microbiology, ancillary and laboratory) are listed below for reference.     Microbiology: Recent Results (from the past 240 hour(s))  SARS Coronavirus 2 (CEPHEID - Performed in [place] hospital lab), Hosp Order     Status: None   Collection Time: 12/26/18  1:00 PM   Specimen: Nasopharyngeal Swab  Result Value Ref Range Status   SARS Coronavirus 2 NEGATIVE NEGATIVE Final    Comment: (NOTE) If result is NEGATIVE SARS-CoV-2 target nucleic acids are NOT DETECTED. The SARS-CoV-2 RNA is generally detectable in upper and lower  respiratory specimens during the acute phase of infection. The lowest  concentration of SARS-CoV-2 viral copies this assay can detect is 250  copies / mL. A negative result does not preclude SARS-CoV-2 infection  and should not be used as the sole basis for treatment or other  patient management decisions.  A negative result may occur with  improper specimen collection / handling, submission of specimen other  than nasopharyngeal swab, presence of viral mutation(s) within the  areas targeted by this assay, and inadequate number of viral copies  (<250 copies / mL). A negative result must be combined with clinical  observations, patient history, and epidemiological information. If result is POSITIVE SARS-CoV-2 target nucleic acids are DETECTED. The SARS-CoV-2 RNA is generally detectable in upper and lower  respiratory specimens dur ing the acute phase of infection.  Positive  results are indicative of active infection with SARS-CoV-2.   Clinical  correlation with patient history and other diagnostic information is  necessary to determine patient infection status.  Positive results do  not rule out bacterial infection or co-infection with other viruses. If result is PRESUMPTIVE POSTIVE SARS-CoV-2 nucleic acids MAY BE PRESENT.   A presumptive positive result was obtained on the submitted specimen  and confirmed on repeat testing.  While 2019 novel coronavirus  (SARS-CoV-2) nucleic acids may be present in the submitted sample  additional confirmatory testing may be necessary for epidemiological  and / or clinical management purposes  to differentiate between  SARS-CoV-2 and other Sarbecovirus currently known to infect humans.  If clinically indicated additional testing with an alternate test  methodology [phone]) is advised. The SARS-CoV-2 RNA is generally  detectable in upper and lower respiratory sp ecimens during the acute  phase of infection. The expected result is Negative. Fact Sheet for Patients:  BoilerBrush.com.cy Fact Sheet for Healthcare Providers: https://pope.com/ This test is not yet approved or cleared by the [place] FDA and has been authorized for detection and/or diagnosis of SARS-CoV-2 by FDA under an Emergency Use Authorization (EUA).  This EUA will remain in effect (meaning this test can be used) for the duration of the COVID-19 declaration under Section 564(b)(1) of the Act, 21 U.S.C. section 360bbb-3(b)(1), unless the authorization is terminated or revoked sooner. Performed at [facility], [address] [place], [place] [address]   MRSA PCR Screening     Status: None   Collection Time: 12/28/18 12:54 PM   Specimen: Nasopharyngeal  Result Value Ref Range Status   MRSA by PCR NEGATIVE NEGATIVE Final    Comment:        The GeneXpert MRSA Assay (FDA approved for NASAL specimens only), is one component of a comprehensive MRSA  colonization surveillance program. It is not intended to diagnose MRSA infection nor to guide or monitor treatment for MRSA infections. Performed at [facility], [address], [place], [place] [address]   SARS Coronavirus 2 (CEPHEID- Performed in [facility] hospital lab), Hosp Order     Status: None   Collection Time: 01/02/19  6:54 AM   Specimen: Nasopharyngeal Swab  Result Value Ref Range Status   SARS Coronavirus 2 NEGATIVE NEGATIVE Final    Comment: (NOTE) If result is NEGATIVE SARS-CoV-2 target nucleic acids are NOT DETECTED. The SARS-CoV-2 RNA is generally detectable in upper and lower  respiratory specimens during the acute phase of infection. The lowest  concentration of SARS-CoV-2 viral copies this assay can detect is 250  copies / mL. A negative result does not preclude SARS-CoV-2 infection  and should not be used as the sole basis for treatment or other  patient management decisions.  A negative result may occur with  improper specimen collection / handling, submission of specimen other  than nasopharyngeal swab, presence of viral mutation(s) within the  areas targeted by this assay, and inadequate number of viral copies  (<250 copies / mL). A negative result must be combined with clinical  observations, patient history, and epidemiological information. If result is POSITIVE SARS-CoV-2 target nucleic acids are DETECTED. The SARS-CoV-2 RNA is generally detectable in upper and lower  respiratory specimens dur ing the acute phase of infection.  Positive  results are indicative of active infection with SARS-CoV-2.  Clinical  correlation with patient history and other diagnostic information is  necessary to determine patient infection status.  Positive results do  not rule out bacterial infection or co-infection with other viruses. If result is PRESUMPTIVE POSTIVE SARS-CoV-2 nucleic acids MAY BE PRESENT.   A presumptive positive result was obtained on the  submitted specimen  and confirmed on repeat testing.  While 2019 novel coronavirus  (SARS-CoV-2) nucleic acids may be present in the submitted sample  additional confirmatory testing may be necessary for epidemiological  and / or clinical management purposes  to differentiate between  SARS-CoV-2 and other Sarbecovirus currently known to infect humans.  If clinically indicated additional testing with an alternate test  methodology [phone]) is advised. The SARS-CoV-2 RNA is generally  detectable in upper and lower respiratory sp ecimens during the acute  phase of infection. The expected result is Negative. Fact Sheet for Patients:  BoilerBrush.com.cy Fact Sheet for Healthcare Providers: https://pope.com/ This test is not yet approved or cleared by the [place] FDA and has been authorized for detection and/or diagnosis of SARS-CoV-2 by FDA under an Emergency Use Authorization (EUA).  This EUA will remain in effect (meaning this test can be used) for the duration of the COVID-19 declaration under Section 564(b)(1) of the Act, 21 U.S.C. section 360bbb-3(b)(1), unless the authorization is terminated or revoked sooner. Performed at [facility], [address], [place], [place] [address]   MRSA PCR Screening     Status: None   Collection Time: 01/02/19  2:18 PM   Specimen: Nasal Mucosa; Nasopharyngeal  Result Value Ref Range Status   MRSA by PCR NEGATIVE NEGATIVE Final    Comment:        The GeneXpert MRSA Assay (FDA approved for NASAL specimens only), is one component of a comprehensive MRSA colonization surveillance program. It is not intended to diagnose MRSA infection nor to guide or monitor treatment for MRSA infections. Performed at [facility], [address], [place], [place] [address]      Labs: CBC: Recent Labs  Lab 12/29/18 0349 01/02/19 0329 01/03/19 0518  WBC 14.7* 10.3  13.2*  NEUTROABS  --  5.9  --   HGB 12.0 10.7* 11.0*  HCT 37.6 34.4* 36.0  MCV 89.3 93.7 94.0  PLT 417* 341 269   Basic Metabolic Panel: Recent Labs  Lab 12/29/18 0349 01/02/19 0329 01/03/19 0518  NA 135 137 138  K 3.8 3.8 4.2  CL 100 108 103  CO2 GLUCOSE 121* 103* 92  BUN CREATININE 0.91 0.73 0.71  CALCIUM 8.4* 8.5* 8.5*  MG  --   --  2.1  PHOS  --   --  3.1   Liver Function Tests: Recent Labs  Lab 01/03/19 0518  AST 17  ALT 29  ALKPHOS 53  BILITOT 0.5  PROT 6.6  ALBUMIN 3.1*   BNP (last 3 results) Recent Labs    01/02/19 0329  BNP 18.3   CBG: Recent Labs  Lab 01/03/19 0835  GLUCAP 94   Hgb A1c Recent Labs    01/03/19 0518  HGBA1C 5.6   Thyroid function studies Recent Labs    01/03/19 0518  TSH 1.100   Anemia work up Recent Labs    01/03/19 0518  VITAMINB12 129*  FOLATE 7.9  FERRITIN 17  TIBC 433  IRON 31  RETICCTPCT 2.9     Time coordinating discharge: 25 minutes  SIGNED:  Marcellus Scott, MD, FACP, [facility]. [facility]  To contact the attending provider between 7A-7P or the covering provider during after hours 7P-7A, please log into the web site [email or web address] and access using universal [place] password for that web site. If you do not have the password, please call the hospital operator.

## 2019-01-04 NOTE — Plan of Care (Addendum)
It might be helpful to this pt to see the dietitian before she is D/C'd. Pt consumes a lot of sugar through her soda consumption and other high calorie foods. She expressed that she would like to eat better and lose weight. Pt was constipated and very uncomfortable. A fleet enema was ordered and administered and pt had a large BM. After the BM, her VS improved.

## 2019-01-04 NOTE — Discharge Instructions (Signed)

## 2019-01-04 NOTE — Progress Notes (Signed)
Went over discharge papers with patient.  All questions answered.  VSS.   

## 2019-01-07 ENCOUNTER — Other Ambulatory Visit: Payer: Self-pay | Admitting: Psychiatry

## 2019-01-07 NOTE — Telephone Encounter (Signed)
Pt was discharged from [place] 7/3.  Called and spoke with pt to see if I could schedule a hospital follow up visit and pt stated that was fine. Pt has been scheduled for hfu with RB 7/13 at 4:30. Nothing further needed.

## 2019-01-07 NOTE — Telephone Encounter (Signed)
Pt got Xanax from the other provider again.  No Xanax or other BZ refills until this is addressed further.

## 2019-01-08 ENCOUNTER — Other Ambulatory Visit: Payer: Self-pay

## 2019-01-08 ENCOUNTER — Ambulatory Visit: Payer: 59

## 2019-01-08 DIAGNOSIS — G4733 Obstructive sleep apnea (adult) (pediatric): Secondary | ICD-10-CM

## 2019-01-09 ENCOUNTER — Ambulatory Visit (HOSPITAL_COMMUNITY)
Admission: RE | Admit: 2019-01-09 | Discharge: 2019-01-09 | Disposition: A | Discharge status: Home / Self Care | Payer: 59 | Source: Ambulatory Visit | Attending: Physician Assistant | Admitting: Physician Assistant

## 2019-01-09 ENCOUNTER — Other Ambulatory Visit (HOSPITAL_COMMUNITY): Payer: Self-pay | Admitting: Neurosurgery

## 2019-01-09 ENCOUNTER — Other Ambulatory Visit (HOSPITAL_COMMUNITY): Payer: 59

## 2019-01-09 DIAGNOSIS — M79606 Pain in leg, unspecified: Secondary | ICD-10-CM | POA: Diagnosis present

## 2019-01-09 DIAGNOSIS — M7989 Other specified soft tissue disorders: Secondary | ICD-10-CM

## 2019-01-09 NOTE — Progress Notes (Signed)
Bilateral lower extremity venouos duplex completed. Preliminary results in Chart review CV Proc. [place] Konnar Ben,RVS 01/09/2019, 2:43 PM

## 2019-01-12 ENCOUNTER — Encounter (HOSPITAL_BASED_OUTPATIENT_CLINIC_OR_DEPARTMENT_OTHER): Payer: 59

## 2019-01-14 ENCOUNTER — Other Ambulatory Visit: Payer: Self-pay

## 2019-01-14 ENCOUNTER — Encounter: Payer: Self-pay | Admitting: Emergency Medicine

## 2019-01-14 ENCOUNTER — Ambulatory Visit: Payer: 59 | Admitting: Emergency Medicine

## 2019-01-14 DIAGNOSIS — J4540 Moderate persistent asthma, uncomplicated: Secondary | ICD-10-CM | POA: Diagnosis not present

## 2019-01-14 DIAGNOSIS — R0683 Snoring: Secondary | ICD-10-CM | POA: Diagnosis not present

## 2019-01-14 NOTE — Assessment & Plan Note (Signed)
Unclear the contribution of her asthma versus her weight gain and restrictive lung disease.  I think both are probably contributors to her dyspnea.  She started Symbicort and place of Asmanex but has not really noticed a difference in her overall breathing.  We will repeat her pulmonary function testing and assess her degree of obstruction.  Now that she has had her back surgery it may be possible for her to reinitiate an exercise regimen, work on weight loss.  Please continue Symbicort 2 puffs twice daily for now.  Rinse and gargle after using. Keep your albuterol available to use either 2 puffs or 1 nebulizer treatment as needed for shortness of breath, chest tightness, wheezing. We will perform pulmonary function testing at your next office visit to assess the activity of your asthma. Follow with Dr. Lamonte Sakai next available with full pulmonary function testing on the same day.

## 2019-01-14 NOTE — Patient Instructions (Signed)
Please continue Symbicort 2 puffs twice daily for now.  Rinse and gargle after using. Keep your albuterol available to use either 2 puffs or 1 nebulizer treatment as needed for shortness of breath, chest tightness, wheezing. We will follow-up the results of your home sleep study. We will perform pulmonary function testing at your next office visit to assess the activity of your asthma. Follow with Dr. Lamonte Sakai next available with full pulmonary function testing on the same day.

## 2019-01-14 NOTE — Progress Notes (Signed)
Subjective:    Patient ID: Carolyn Castro, female    DOB: 14-Jan-1981, 38 y.o.   MRN: [id number]  HPI 38 year old never smoker with history of hypothyroidism.  She had childhood asthma and is carried a diagnosis of adult asthma since her 68's. She has been on singulair, asmanex, allegra, nasonex.   She has been having more trouble over the last 6 months - nocturnal awakenings, dyspnea, cough, wheeze. Can also happen during the day. Hears some throat noise occasionally. The episodes are moderately responsive to albuterol. She has DOE at times at work, when walking. She is having breakthrough reflux symptom, especially at night or after dinner. Some breakthrough allergy symptoms even on her meds above. Unsure whether she snores, may have had witnessed apneas before.  Flares about 3x a year. Has gained about 40 lbs over 6 months.   CXR yesterday reviewed > normal  Hospital F/U Visit 01/14/2019 --patient has a history asthma, for seen here 6/23.  I tried changing her from Asmanex to Symbicort to see if she get more benefit.  We also arranged for pulmonary function testing.  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  She was subsequently readmitted 7/1 with pleuritic chest pain wheezing, dyspnea and possible HCAP -equivocal infiltrates on CT-PA.  She was treated with steroids, abx. She did improve and feels back to baseline. She is unsure whether the Symbicort is better than asmanex. She has albuterol nebs and HFA, uses it about 1-2x a week.    Review of Systems  Constitutional: Positive for unexpected weight change.  HENT: Positive for congestion, ear pain and sneezing.   Respiratory: Positive for cough, chest tightness and shortness of breath.   Cardiovascular: Positive for chest pain.  Gastrointestinal:       Acid Reflux  Neurological: Positive for headaches.       Objective:   Physical Exam  Vitals:   01/14/19 1636  BP:  120/70  Pulse: (!) 110  SpO2: 97%  Weight: 288 lb (130.6 kg)  Height: 5\' 4"  (1.626 m)   Gen: Pleasant, obese woman, in no distress,  normal affect  ENT: No lesions,  mouth clear,  oropharynx clear, no postnasal drip  Neck: No JVD, no stridor  Lungs: No use of accessory muscles, no crackles or wheezing on normal respiration, no wheeze on forced expiration  Cardiovascular: RRR, heart sounds normal, no murmur or gallops, no peripheral edema  Musculoskeletal: No deformities, no cyanosis or clubbing  Neuro: alert, awake, non focal  Skin: Warm, no lesions or rash     Assessment & Plan:  Asthma Unclear the contribution of her asthma versus her weight gain and restrictive lung disease.  I think both are probably contributors to her dyspnea.  She started Symbicort and place of Asmanex but has not really noticed a difference in her overall breathing.  We will repeat her pulmonary function testing and assess her degree of obstruction.  Now that she has had her back surgery it may be possible for her to reinitiate an exercise regimen, work on weight loss.  Please continue Symbicort 2 puffs twice daily for now.  Rinse and gargle after using. Keep your albuterol available to use either 2 puffs or 1 nebulizer treatment as needed for shortness of breath, chest tightness, wheezing. We will perform pulmonary function testing at your next office visit to assess the activity of your asthma. Follow with Dr. Lamonte Sakai next available with full pulmonary  function testing on the same day.  Snoring High suspicion for sleep apnea.  She had her home sleep test, the results are pending.  I will follow-up with her when they are available  Levy Pupaobert Rodricus Candelaria, MD, PhD 01/14/2019, 5:08 PM [facility] [phone] or if no answer [phone]

## 2019-01-14 NOTE — Assessment & Plan Note (Signed)
High suspicion for sleep apnea.  She had her home sleep test, the results are pending.  I will follow-up with her when they are available

## 2019-01-15 DIAGNOSIS — G4733 Obstructive sleep apnea (adult) (pediatric): Secondary | ICD-10-CM

## 2019-01-17 ENCOUNTER — Telehealth: Payer: Self-pay | Admitting: Emergency Medicine

## 2019-01-18 ENCOUNTER — Other Ambulatory Visit: Payer: Self-pay | Admitting: Psychiatry

## 2019-01-18 NOTE — Telephone Encounter (Signed)
Contacted patient and scheduled PFT at [facility] on 01/30/2019- mailed PFT instructions -pr

## 2019-01-24 ENCOUNTER — Other Ambulatory Visit (HOSPITAL_COMMUNITY)
Admission: RE | Admit: 2019-01-24 | Discharge: 2019-01-24 | Disposition: A | Discharge status: Home / Self Care | Payer: 59 | Source: Ambulatory Visit | Attending: Gastroenterology | Admitting: Gastroenterology

## 2019-01-24 ENCOUNTER — Encounter (HOSPITAL_COMMUNITY): Payer: Self-pay | Admitting: *Deleted

## 2019-01-24 ENCOUNTER — Other Ambulatory Visit: Payer: Self-pay

## 2019-01-24 ENCOUNTER — Telehealth: Payer: Self-pay | Admitting: Emergency Medicine

## 2019-01-24 DIAGNOSIS — Z1159 Encounter for screening for other viral diseases: Secondary | ICD-10-CM | POA: Insufficient documentation

## 2019-01-24 DIAGNOSIS — G4734 Idiopathic sleep related nonobstructive alveolar hypoventilation: Secondary | ICD-10-CM

## 2019-01-24 LAB — SARS CORONAVIRUS 2 (TAT 6-24 HRS): SARS Coronavirus 2: NEGATIVE

## 2019-01-24 NOTE — Telephone Encounter (Signed)
Sleep study did not show sleep apnea, but did show oxygen levels dropping throughout the night. Please order 2L of O2 for patient at night for nocturnal hypoxemia. Sats less than 85% for 58 minutes.

## 2019-01-24 NOTE — Telephone Encounter (Signed)
Pt called requesting to know if the results of her sleep study have come back yet. Tonya, please advise on this for pt. Thanks!

## 2019-01-24 NOTE — Telephone Encounter (Signed)
Call made to patient, made aware of results per [place]:  Sleep study did not show sleep apnea, but did show oxygen levels dropping throughout the night. Please order 2L of O2 for patient at night for nocturnal hypoxemia. Sats less than 85% for 58 minutes.   Voiced understanding. Order placed. Nothing further needed at this time.

## 2019-01-28 ENCOUNTER — Ambulatory Visit (HOSPITAL_COMMUNITY): Payer: 59 | Admitting: Physician Assistant

## 2019-01-28 ENCOUNTER — Encounter (HOSPITAL_COMMUNITY): Payer: Self-pay | Admitting: *Deleted

## 2019-01-28 ENCOUNTER — Ambulatory Visit (HOSPITAL_COMMUNITY)
Admission: RE | Admit: 2019-01-28 | Discharge: 2019-01-28 | Disposition: A | Discharge status: Home / Self Care | Payer: 59 | Attending: Gastroenterology | Admitting: Gastroenterology

## 2019-01-28 ENCOUNTER — Encounter (HOSPITAL_COMMUNITY)
Admission: RE | Disposition: A | Discharge status: Home / Self Care | Payer: Self-pay | Source: Home / Self Care | Attending: Gastroenterology

## 2019-01-28 ENCOUNTER — Other Ambulatory Visit: Payer: Self-pay

## 2019-01-28 DIAGNOSIS — I10 Essential (primary) hypertension: Secondary | ICD-10-CM | POA: Diagnosis not present

## 2019-01-28 DIAGNOSIS — Z79890 Hormone replacement therapy (postmenopausal): Secondary | ICD-10-CM | POA: Insufficient documentation

## 2019-01-28 DIAGNOSIS — J45909 Unspecified asthma, uncomplicated: Secondary | ICD-10-CM | POA: Diagnosis not present

## 2019-01-28 DIAGNOSIS — D649 Anemia, unspecified: Secondary | ICD-10-CM | POA: Insufficient documentation

## 2019-01-28 DIAGNOSIS — E039 Hypothyroidism, unspecified: Secondary | ICD-10-CM | POA: Diagnosis not present

## 2019-01-28 DIAGNOSIS — Z79899 Other long term (current) drug therapy: Secondary | ICD-10-CM | POA: Diagnosis not present

## 2019-01-28 DIAGNOSIS — K219 Gastro-esophageal reflux disease without esophagitis: Secondary | ICD-10-CM | POA: Diagnosis not present

## 2019-01-28 DIAGNOSIS — F419 Anxiety disorder, unspecified: Secondary | ICD-10-CM | POA: Insufficient documentation

## 2019-01-28 DIAGNOSIS — Z793 Long term (current) use of hormonal contraceptives: Secondary | ICD-10-CM | POA: Insufficient documentation

## 2019-01-28 HISTORY — PX: ESOPHAGOGASTRODUODENOSCOPY (EGD) WITH PROPOFOL: SHX5813

## 2019-01-28 LAB — PREGNANCY, URINE: Preg Test, Ur: NEGATIVE

## 2019-01-28 SURGERY — ESOPHAGOGASTRODUODENOSCOPY (EGD) WITH PROPOFOL
Anesthesia: Monitor Anesthesia Care

## 2019-01-28 MED ORDER — PROPOFOL 500 MG/50ML IV EMUL
INTRAVENOUS | Status: DC | PRN
  Administered 2019-01-28: 75 ug/kg/min via INTRAVENOUS

## 2019-01-28 MED ORDER — PROPOFOL 10 MG/ML IV BOLUS
INTRAVENOUS | Status: DC | PRN
  Administered 2019-01-28 (×2): 20 mg via INTRAVENOUS
  Administered 2019-01-28: 40 mg via INTRAVENOUS

## 2019-01-28 MED ORDER — LACTATED RINGERS IV SOLN
INTRAVENOUS | Status: DC
  Administered 2019-01-28: 1000 mL via INTRAVENOUS

## 2019-01-28 MED ORDER — SODIUM CHLORIDE 0.9 % IV SOLN: INTRAVENOUS | Status: DC

## 2019-01-28 MED ORDER — PROPOFOL 10 MG/ML IV BOLUS
INTRAVENOUS | Status: AC
  Filled 2019-01-28: qty 40

## 2019-01-28 SURGICAL SUPPLY — 15 items

## 2019-01-28 NOTE — Op Note (Signed)
[facility] Patient Name: Carolyn Castro Procedure Date: 01/28/2019 MRN: [id number] Attending MD: Nancy Fetter Dr., MD Date of Birth: 09-11-1980 CSN: [id number] Age: 38 Admit Type: Inpatient Procedure:                Upper GI endoscopy Indications:              Esophageal reflux, For therapy of esophageal reflux Providers:                Jeneen Rinks L. Oletta Lamas Dr., MD, Elmer Ramp. Tilden Dome, RN,                            Ladona Ridgel, Technician Referring MD:              Medicines:                See the Anesthesia note for documentation of the                            administered medications Complications:            No immediate complications. Estimated Blood Loss:     Estimated blood loss: none. Procedure:                Pre-Anesthesia Assessment:                           - Prior to the procedure, a History and Physical                            was performed, and patient medications and                            allergies were reviewed. The patient's tolerance of                            previous anesthesia was also reviewed. The risks                            and benefits of the procedure and the sedation                            options and risks were discussed with the patient.                            All questions were answered, and informed consent                            was obtained. Prior Anticoagulants: The patient has                            taken no previous anticoagulant or antiplatelet                            agents. ASA Grade Assessment: per Anesthesia. After  reviewing the risks and benefits, the patient was                            deemed in satisfactory condition to undergo the                            procedure.                           After obtaining informed consent, the endoscope was                            passed under direct vision. Throughout the                            procedure,  the patient's blood pressure, pulse, and                            oxygen saturations were monitored continuously. The                            [id number]-[id number] ([id number] Olympus gastroscope was                            introduced through the mouth, and advanced to the                            second part of duodenum. The upper GI endoscopy was                            accomplished without difficulty. The patient                            tolerated the procedure well. Scope In: Scope Out: Findings:      There is no endoscopic evidence of Barrett's esophagus, bleeding,       esophagitis, inflammation, stricture or varices in the entire esophagus.      The stomach was normal.      The examined duodenum was normal. Impression:               - Normal stomach.                           - Normal examined duodenum.                           - No specimens collected.                           - Normal examination. Moderate Sedation:      See anesthesia note, no moderate sedation. Recommendation:           - Patient has a contact number available for                            emergencies. The signs and symptoms of potential  delayed complications were discussed with the                            patient. Return to normal activities tomorrow.                            Written discharge instructions were provided to the                            patient.                           - Resume previous diet.                           - Continue present medications.                           - Return to endoscopist in 2 months. Procedure Code(s):        --- Professional ---                           [phone], Esophagogastroduodenoscopy, flexible,                            transoral; diagnostic, including collection of                            specimen(s) by brushing or washing, when performed                            (separate procedure) Diagnosis Code(s):         --- Professional ---                           K21.9, Gastro-esophageal reflux disease without                            esophagitis CPT copyright 2019 American Medical Association. All rights reserved. The codes documented in this report are preliminary and upon coder review may  be revised to meet current compliance requirements. Carolyn Castro Dr., MD 01/28/2019 8:23:09 AM This report has been signed electronically. Number of Addenda: 0

## 2019-01-28 NOTE — H&P (Signed)
Subjective:   Patient is a 38 y.o. female presents with chronic reflux difficult to manage   procedure including risks and benefits discussed in office.  Patient Active Problem List   Diagnosis Date Noted  . Normocytic anemia 01/02/2019  . Hyperglycemia 01/02/2019  . GERD (gastroesophageal reflux disease) 01/02/2019  . Acute paraplegia (HCC) 12/26/2018  . Lumbar stenosis with neurogenic claudication 12/26/2018  . Obesity, Class III, BMI 40-49.9 (morbid obesity) (HCC) 12/26/2018  . Essential hypertension 12/26/2018  . Near syncope 12/26/2018  . Lumbar stenosis 12/26/2018  . Asthma 12/25/2018  . Snoring 12/25/2018  . Low back pain 09/04/2018  . Hypothyroidism 09/04/2018  . Infected incision 07/04/2018  . Adnexal mass 06/29/2018  . Pelvic pain 06/29/2018   Past Medical History:  Diagnosis Date  . Anxiety   . Asthma   . Back pain   . Hypertension   . Hypothyroid     Past Surgical History:  Procedure Laterality Date  . BACK SURGERY    . COLONOSCOPY    . LAPAROSCOPIC OVARIAN CYSTECTOMY Left 06/29/2018   Procedure: LAPAROSCOPIC OVARIAN CYSTECTOMY;  Surgeon: Gerald Leitzole, Tara, MD;  Location: [facility];  Service: Gynecology;  Laterality: Left;  . TONSILLECTOMY    . WISDOM TOOTH EXTRACTION      Medications Prior to Admission  Medication Sig Dispense Refill Last Dose  . albuterol (VENTOLIN HFA) 108 (90 Base) MCG/ACT inhaler Inhale 2 puffs into the lungs every 6 (six) hours as needed for wheezing or shortness of breath.   01/27/2019 at Unknown time  . ALPRAZolam (XANAX) 0.5 MG tablet Take 1 tablet (0.5 mg total) by mouth 3 (three) times daily as needed for anxiety. (Patient taking differently: Take 0.5 mg by mouth 2 (two) times daily as needed for anxiety. ) 90 tablet 1 01/28/2019 at Unknown time  . amLODipine (NORVASC) 10 MG tablet Take 10 mg by mouth daily.   01/28/2019 at Unknown time  . cholecalciferol (VITAMIN D3) 25 MCG (1000 UT) tablet Take 1,000 Units by mouth daily.    01/28/2019 at Unknown time  . CRANBERRY PO Take 1 tablet by mouth 2 (two) times daily.    01/28/2019 at Unknown time  . diphenhydrAMINE (BENADRYL) 25 MG tablet Take 50 mg by mouth as needed for itching or allergies.    Past Week at Unknown time  . Docusate Sodium (STOOL SOFTENER) 100 MG capsule Take 100 mg by mouth 2 (two) times daily.   Past Week at Unknown time  . ferrous sulfate 325 (65 FE) MG tablet Take 1 tablet (325 mg total) by mouth 2 (two) times daily with a meal. 60 tablet 0 01/28/2019 at Unknown time  . fexofenadine (ALLEGRA) 180 MG tablet Take 180 mg by mouth daily.   01/28/2019 at Unknown time  . ibuprofen (ADVIL,MOTRIN) 200 MG tablet Take 800 mg by mouth every 6 (six) hours as needed (for pain).    Past Week at Unknown time  . levothyroxine (SYNTHROID, LEVOTHROID) 75 MCG tablet Take 75 mcg by mouth daily before breakfast.   01/28/2019 at Unknown time  . mometasone (ASMANEX) 220 MCG/INH inhaler Inhale 2 puffs into the lungs 2 (two) times daily.    01/27/2019 at Unknown time  . mometasone (NASONEX) 50 MCG/ACT nasal spray Place 2 sprays into the nose daily.   01/27/2019 at Unknown time  . montelukast (SINGULAIR) 10 MG tablet Take 10 mg by mouth every evening.    01/27/2019 at Unknown time  . norethindrone-ethinyl estradiol (MICROGESTIN,JUNEL,LOESTRIN) 1-20 MG-MCG tablet Take  1 tablet by mouth daily.   01/28/2019 at Unknown time  . omeprazole (PRILOSEC OTC) 20 MG tablet Take 20 mg by mouth 2 (two) times daily.    01/28/2019 at Unknown time  . oxyCODONE (OXY IR/ROXICODONE) 5 MG immediate release tablet Take 1 tablet (5 mg total) by mouth every 6 (six) hours as needed for moderate pain or severe pain. 24 tablet 0 01/28/2019 at Unknown time  . PARoxetine (PAXIL) 30 MG tablet Take 2 tablets (60 mg total) by mouth daily. 60 tablet 1 01/27/2019 at Unknown time  . senna (SENOKOT) 8.6 MG TABS tablet Take 1 tablet (8.6 mg total) by mouth daily as needed for mild constipation or moderate constipation. 10 tablet  0 01/27/2019 at Unknown time  . traZODone (DESYREL) 50 MG tablet TAKE 1 TO 2 TABLETS(50 TO 100 MG) BY MOUTH AT BEDTIME 60 tablet 0 01/27/2019 at Unknown time  . vitamin B-12 1000 MCG tablet Take 1 tablet (1,000 mcg total) by mouth daily. 30 tablet 0 01/28/2019 at Unknown time  . acetaminophen (TYLENOL) 500 MG tablet Take 1,000 mg by mouth every 6 (six) hours as needed (for pain).      . Probiotic Product (PROBIOTIC DAILY PO) Take 1 tablet by mouth daily.      Allergies  Allergen Reactions  . Ciprofloxacin Hcl Diarrhea and Other (See Comments)    Developed C-Diff    Social History   Tobacco Use  . Smoking status: Never Smoker  . Smokeless tobacco: Never Used  Substance Use Topics  . Alcohol use: Never    Frequency: Never    Family History  Problem Relation Age of Onset  . Hypertension Mother   . Hypertension Father      Objective:   Patient Vitals for the past 8 hrs:  BP Temp Temp src Pulse Resp SpO2 Height Weight  01/28/19 0719 (!) 152/83 97.7 F (36.5 C) Oral 93 12 94 % 5\' 4"  (1.626 m) 130.6 kg   No intake/output data recorded. No intake/output data recorded.   See MD Preop evaluation      Assessment:   1.  Esophageal reflux  Plan:   EGD with propofol sedation procedure discussed in detail in the office

## 2019-01-28 NOTE — Transfer of Care (Signed)
Immediate Anesthesia Transfer of Care Note  Patient: Carolyn Castro  Procedure(s) Performed: Procedure(s): ESOPHAGOGASTRODUODENOSCOPY (EGD) WITH PROPOFOL (N/A)  Patient Location: PACU  Anesthesia Type:MAC  Level of Consciousness:  sedated, patient cooperative and responds to stimulation  Airway & Oxygen Therapy:Patient Spontanous Breathing and Patient connected to face mask oxgen  Post-op Assessment:  Report given to PACU RN and Post -op Vital signs reviewed and stable  Post vital signs:  Reviewed and stable  Last Vitals:  Vitals:   01/28/19 0719  BP: (!) 152/83  Pulse: 93  Resp: 12  Temp: 36.5 C  SpO2: 91%    Complications: No apparent anesthesia complications

## 2019-01-28 NOTE — Discharge Instructions (Signed)

## 2019-01-28 NOTE — Anesthesia Postprocedure Evaluation (Signed)
Anesthesia Post Note  Patient: Carolyn Castro  Procedure(s) Performed: ESOPHAGOGASTRODUODENOSCOPY (EGD) WITH PROPOFOL (N/A )     Patient location during evaluation: Endoscopy Anesthesia Type: MAC Level of consciousness: awake and alert Pain management: pain level controlled Vital Signs Assessment: post-procedure vital signs reviewed and stable Respiratory status: spontaneous breathing, nonlabored ventilation, respiratory function stable and patient connected to nasal cannula oxygen Cardiovascular status: stable and blood pressure returned to baseline Postop Assessment: no apparent nausea or vomiting Anesthetic complications: no    Last Vitals:  Vitals:   01/28/19 0819 01/28/19 0830  BP: (!) 151/92 (!) 147/108  Pulse: 90 92  Resp: (!) 22 (!) 26  Temp: (!) 36.1 C   SpO2: 100% 98%    Last Pain:  Vitals:   01/28/19 0830  TempSrc:   PainSc: 0-No pain                 Lorilee Cafarella COKER

## 2019-01-28 NOTE — Anesthesia Preprocedure Evaluation (Signed)
Anesthesia Evaluation  Patient identified by MRN, date of birth, ID band Patient awake    Reviewed: Allergy & Precautions, NPO status , Patient's Chart, lab work & pertinent test results  Airway Mallampati: III  TM Distance: >3 FB Neck ROM: Full    Dental  (+) Teeth Intact, Dental Advisory Given   Pulmonary    breath sounds clear to auscultation       Cardiovascular hypertension,  Rhythm:Regular Rate:Normal     Neuro/Psych    GI/Hepatic   Endo/Other    Renal/GU      Musculoskeletal   Abdominal (+) + obese,   Peds  Hematology   Anesthesia Other Findings   Reproductive/Obstetrics                             Anesthesia Physical Anesthesia Plan  ASA: II  Anesthesia Plan: MAC   Post-op Pain Management:    Induction: Intravenous  PONV Risk Score and Plan:   Airway Management Planned: Natural Airway and Nasal Cannula  Additional Equipment:   Intra-op Plan:   Post-operative Plan:   Informed Consent: I have reviewed the patients History and Physical, chart, labs and discussed the procedure including the risks, benefits and alternatives for the proposed anesthesia with the patient or authorized representative who has indicated his/her understanding and acceptance.     Dental advisory given  Plan Discussed with: CRNA and Anesthesiologist  Anesthesia Plan Comments:         Anesthesia Quick Evaluation

## 2019-01-29 ENCOUNTER — Encounter (HOSPITAL_COMMUNITY): Payer: Self-pay | Admitting: Gastroenterology

## 2019-01-30 ENCOUNTER — Ambulatory Visit (HOSPITAL_COMMUNITY)
Admission: RE | Admit: 2019-01-30 | Discharge: 2019-01-30 | Disposition: A | Discharge status: Home / Self Care | Payer: 59 | Source: Ambulatory Visit | Attending: Emergency Medicine | Admitting: Emergency Medicine

## 2019-01-30 ENCOUNTER — Other Ambulatory Visit: Payer: Self-pay

## 2019-01-30 DIAGNOSIS — J4540 Moderate persistent asthma, uncomplicated: Secondary | ICD-10-CM | POA: Insufficient documentation

## 2019-01-30 LAB — PULMONARY FUNCTION TEST
DL/VA % pred: 136 %
DL/VA: 6.15 ml/min/mmHg/L
DLCO unc % pred: 105 %
DLCO unc: 23.37 ml/min/mmHg
FEF 25-75 Post: 1.72 L/sec
FEF 25-75 Pre: 2.22 L/sec
FEF2575-%Change-Post: -22 %
FEF2575-%Pred-Post: 52 %
FEF2575-%Pred-Pre: 67 %
FEV1-%Change-Post: -6 %
FEV1-%Pred-Post: 69 %
FEV1-%Pred-Pre: 73 %
FEV1-Post: 2.13 L
FEV1-Pre: 2.27 L
FEV1FVC-%Change-Post: -3 %
FEV1FVC-%Pred-Pre: 94 %
FEV6-%Change-Post: -2 %
FEV6-%Pred-Post: 75 %
FEV6-%Pred-Pre: 77 %
FEV6-Post: 2.79 L
FEV6-Pre: 2.85 L
FEV6FVC-%Pred-Post: 101 %
FEV6FVC-%Pred-Pre: 101 %
FVC-%Change-Post: -3 %
FVC-%Pred-Post: 74 %
FVC-%Pred-Pre: 77 %
FVC-Post: 2.79 L
FVC-Pre: 2.88 L
Post FEV1/FVC ratio: 76 %
Post FEV6/FVC ratio: 100 %
Pre FEV1/FVC ratio: 79 %
Pre FEV6/FVC Ratio: 100 %
RV % pred: 65 %
RV: 0.98 L
TLC % pred: 77 %
TLC: 3.91 L

## 2019-01-30 MED ORDER — ALBUTEROL SULFATE (2.5 MG/3ML) 0.083% IN NEBU
2.5000 mg | INHALATION_SOLUTION | Freq: Once | RESPIRATORY_TRACT | Status: AC
  Administered 2019-01-30: 2.5 mg via RESPIRATORY_TRACT

## 2019-02-02 ENCOUNTER — Other Ambulatory Visit: Payer: Self-pay | Admitting: Psychiatry

## 2019-02-02 DIAGNOSIS — F4001 Agoraphobia with panic disorder: Secondary | ICD-10-CM

## 2019-02-02 DIAGNOSIS — F325 Major depressive disorder, single episode, in full remission: Secondary | ICD-10-CM

## 2019-02-14 ENCOUNTER — Telehealth: Payer: Self-pay | Admitting: Emergency Medicine

## 2019-02-14 NOTE — Telephone Encounter (Signed)
Attempted to call pt but line went straight to VM. Left message for pt to return call.  Instructions  Please continue Symbicort 2 puffs twice daily for now.  Rinse and gargle after using. Keep your albuterol available to use either 2 puffs or 1 nebulizer treatment as needed for shortness of breath, chest tightness, wheezing. We will follow-up the results of your home sleep study. We will perform pulmonary function testing at your next office visit to assess the activity of your asthma. Follow with Dr. Lamonte Sakai next available with full pulmonary function testing on the same day.       When pt returns call, please schedule pt a follow up appt with Dr. Lamonte Sakai so that way results of PFT can be discussed. Thanks!

## 2019-02-15 NOTE — Telephone Encounter (Signed)
Spoke with pt, follow up appt was scheduled for 02/20/2019 at 315pm. Nothing further is needed.

## 2019-02-16 ENCOUNTER — Other Ambulatory Visit: Payer: Self-pay | Admitting: Psychiatry

## 2019-02-20 ENCOUNTER — Encounter: Payer: Self-pay | Admitting: Emergency Medicine

## 2019-02-20 ENCOUNTER — Other Ambulatory Visit: Payer: Self-pay

## 2019-02-20 ENCOUNTER — Ambulatory Visit: Payer: 59 | Admitting: Emergency Medicine

## 2019-02-20 DIAGNOSIS — J4540 Moderate persistent asthma, uncomplicated: Secondary | ICD-10-CM

## 2019-02-20 DIAGNOSIS — K219 Gastro-esophageal reflux disease without esophagitis: Secondary | ICD-10-CM | POA: Diagnosis not present

## 2019-02-20 DIAGNOSIS — J301 Allergic rhinitis due to pollen: Secondary | ICD-10-CM | POA: Diagnosis not present

## 2019-02-20 DIAGNOSIS — J309 Allergic rhinitis, unspecified: Secondary | ICD-10-CM | POA: Insufficient documentation

## 2019-02-20 DIAGNOSIS — Z23 Encounter for immunization: Secondary | ICD-10-CM

## 2019-02-20 MED ORDER — BUDESONIDE-FORMOTEROL FUMARATE 160-4.5 MCG/ACT IN AERO: 2.0000 | INHALATION_SPRAY | Freq: Two times a day (BID) | RESPIRATORY_TRACT | 5 refills | Status: DC

## 2019-02-20 NOTE — Assessment & Plan Note (Signed)
Moderately decreased FEV1 with mixed obstruction and restriction.  No bronchodilator response.  She is not clear whether she got more benefit from Symbicort but may have done so.  I like to continue it for now we may decide to scale back to just an ICS at some point in the future.  Of note her insurance is going to stop paying for Asmanex.  Continue albuterol as needed.  Controlled GERD and rhinitis as aggressively as possible.  Flu shot

## 2019-02-20 NOTE — Patient Instructions (Signed)
Your pulmonary function testing today shows evidence for slightly abnormal airflow as well as restriction on the size of each breath you are able to take. We will continue Symbicort 2 puffs twice a day.  Rinse and gargle after using this medication. Keep your albuterol available to use 2 puffs if needed for shortness of breath, chest tightness, wheezing. Your overnight sleep test shows low oxygen levels while you are in deep sleep.  We will continue oxygen at 2 L/min while sleeping. Please continue Nasonex, Allegra, Singulair as you have been taking them. Please continue omeprazole twice a day. You need the flu shot this fall. Follow with Carolyn Castro in 6 months or sooner if you have any problems

## 2019-02-20 NOTE — Assessment & Plan Note (Signed)
Contributing to her restrictive disease.  We talked about the benefits of weight loss.  She is motivated to increasing her exercise and slow steady weight loss.

## 2019-02-20 NOTE — Assessment & Plan Note (Signed)
Please continue Nasonex, Allegra, Singulair as you have been taking them.

## 2019-02-20 NOTE — Progress Notes (Signed)
Subjective:    Patient ID: Carolyn Castro, female    DOB: October 21, 1980, 38 y.o.   MRN: [id number]  HPI 38 year old never smoker with history of hypothyroidism.  She had childhood asthma and is carried a diagnosis of adult asthma since her 2720's. She has been on singulair, asmanex, allegra, nasonex.   She has been having more trouble over the last 6 months - nocturnal awakenings, dyspnea, cough, wheeze. Can also happen during the day. Hears some throat noise occasionally. The episodes are moderately responsive to albuterol. She has DOE at times at work, when walking. She is having breakthrough reflux symptom, especially at night or after dinner. Some breakthrough allergy symptoms even on her meds above. Unsure whether she snores, may have had witnessed apneas before.  Flares about 3x a year. Has gained about 40 lbs over 6 months.   CXR yesterday reviewed > normal  Hospital F/U Visit 01/14/2019 --patient has a history asthma, for seen here 6/23.  I tried changing her from Asmanex to Symbicort to see if she get more benefit.  We also arranged for pulmonary function testing.  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  She was subsequently readmitted 7/1 with pleuritic chest pain wheezing, dyspnea and possible HCAP -equivocal infiltrates on CT-PA.  She was treated with steroids, abx. She did improve and feels back to baseline. She is unsure whether the Symbicort is better than asmanex. She has albuterol nebs and HFA, uses it about 1-2x a week.   ROV 02/20/2019 --38 year old woman with history of obesity, hypothyroidism and asthma.  She is being managed currently on Symbicort.  She underwent pulmonary function testing today which I have reviewed, these show  Moderate obstruction with some variability of her inspiratory loop, no bronchodilator response.  Lung volumes were restricted.  Diffusion capacity normal. She hasn't really noticed a difference  on the Symbicort (from asmanex). Her insurance is about to stop paying for asmanex.  Uses albuterol about 2-3x a week. She is on nasonex, allegra, singulair - drainage well controlled. Reflux appears to be adequately controlled on omeprazole bid.  Her home sleep study from 01/08/2019 showed an AHI of 4.7/h but did confirm approximately 58 minutes of desaturation.  CPAP was not indicated but nocturnal oxygen was recommended.  She was started on 2 L/min. She feels that her sleep quality and her daytime wakefulness is much improved now that she is on the O2.     Review of Systems  Constitutional: Positive for unexpected weight change.  HENT: Positive for congestion, ear pain and sneezing.   Respiratory: Positive for cough, chest tightness and shortness of breath.   Cardiovascular: Positive for chest pain.  Gastrointestinal:       Acid Reflux  Neurological: Positive for headaches.       Objective:   Physical Exam  Vitals:   02/20/19 1531  BP: 116/74  Pulse: 100  SpO2: 100%  Weight: 289 lb (131.1 kg)  Height: 5\' 4"  (1.626 m)   Gen: Pleasant, obese woman, in no distress,  normal affect  ENT: No lesions,  mouth clear,  oropharynx clear, no postnasal drip  Neck: No JVD, no stridor  Lungs: No use of accessory muscles, no crackles or wheezing on normal respiration, no wheeze on forced expiration  Cardiovascular: RRR, heart sounds normal, no murmur or gallops, no peripheral edema  Musculoskeletal: No deformities, no cyanosis or clubbing  Neuro: alert, awake, non focal  Skin: Warm,  no lesions or rash     Assessment & Plan:  Asthma Moderately decreased FEV1 with mixed obstruction and restriction.  No bronchodilator response.  She is not clear whether she got more benefit from Symbicort but may have done so.  I like to continue it for now we may decide to scale back to just an ICS at some point in the future.  Of note her insurance is going to stop paying for Asmanex.  Continue  albuterol as needed.  Controlled GERD and rhinitis as aggressively as possible.  Flu shot  GERD (gastroesophageal reflux disease) Continue omeprazole  Allergic rhinitis Please continue Nasonex, Allegra, Singulair as you have been taking them.  Obesity, Class III, BMI 40-49.9 (morbid obesity) ([place]) Contributing to her restrictive disease.  We talked about the benefits of weight loss.  She is motivated to increasing her exercise and slow steady weight loss.  Baltazar Apo, MD, PhD 02/20/2019, 4:04 PM [facility] [phone] or if no answer [phone]

## 2019-02-20 NOTE — Assessment & Plan Note (Signed)
Continue omeprazole 

## 2019-02-21 ENCOUNTER — Telehealth: Payer: Self-pay | Admitting: Psychiatry

## 2019-02-21 NOTE — Telephone Encounter (Signed)
Plus on controlled pain medications

## 2019-02-21 NOTE — Telephone Encounter (Signed)
Put on canc list

## 2019-02-21 NOTE — Telephone Encounter (Signed)
Put patient on cancellation list.  Need to see her for 30 minutes.  I cannot adequately manage her anxiety especially with her getting Xanax from another provider in addition to me.  She has to be seen before I can make other med changes.

## 2019-02-21 NOTE — Telephone Encounter (Signed)
Pt left v-mail. Having bad panic attacks then turns into asthma attack. Also having diarrhea for about a week. Please advise [phone]

## 2019-03-03 ENCOUNTER — Other Ambulatory Visit: Payer: Self-pay | Admitting: Psychiatry

## 2019-03-03 DIAGNOSIS — F325 Major depressive disorder, single episode, in full remission: Secondary | ICD-10-CM

## 2019-03-03 DIAGNOSIS — F4001 Agoraphobia with panic disorder: Secondary | ICD-10-CM

## 2019-03-04 NOTE — Telephone Encounter (Signed)
See if we can get this patient in to 1 of my vacancies today for 30 minutes

## 2019-03-05 ENCOUNTER — Ambulatory Visit
Admission: RE | Admit: 2019-03-05 | Discharge: 2019-03-05 | Disposition: A | Discharge status: Home / Self Care | Payer: 59 | Source: Ambulatory Visit | Attending: Family Medicine | Admitting: Family Medicine

## 2019-03-05 ENCOUNTER — Other Ambulatory Visit: Payer: Self-pay | Admitting: Family Medicine

## 2019-03-05 DIAGNOSIS — M5442 Lumbago with sciatica, left side: Secondary | ICD-10-CM

## 2019-03-07 ENCOUNTER — Encounter: Payer: Self-pay | Admitting: Neurology

## 2019-03-18 ENCOUNTER — Encounter: Payer: Self-pay | Admitting: Podiatry

## 2019-03-18 ENCOUNTER — Other Ambulatory Visit: Payer: Self-pay

## 2019-03-18 ENCOUNTER — Other Ambulatory Visit: Payer: Self-pay | Admitting: Podiatry

## 2019-03-18 ENCOUNTER — Ambulatory Visit (INDEPENDENT_AMBULATORY_CARE_PROVIDER_SITE_OTHER): Payer: 59 | Admitting: Podiatry

## 2019-03-18 ENCOUNTER — Ambulatory Visit (INDEPENDENT_AMBULATORY_CARE_PROVIDER_SITE_OTHER): Payer: 59

## 2019-03-18 VITALS — BP 129/93 | HR 96

## 2019-03-18 DIAGNOSIS — M779 Enthesopathy, unspecified: Secondary | ICD-10-CM | POA: Diagnosis not present

## 2019-03-18 DIAGNOSIS — M25472 Effusion, left ankle: Secondary | ICD-10-CM

## 2019-03-18 DIAGNOSIS — M79672 Pain in left foot: Secondary | ICD-10-CM

## 2019-03-18 DIAGNOSIS — M778 Other enthesopathies, not elsewhere classified: Secondary | ICD-10-CM

## 2019-03-18 DIAGNOSIS — M25471 Effusion, right ankle: Secondary | ICD-10-CM | POA: Diagnosis not present

## 2019-03-18 DIAGNOSIS — M79671 Pain in right foot: Secondary | ICD-10-CM | POA: Diagnosis not present

## 2019-03-20 NOTE — Progress Notes (Signed)
   HPI: 38 y.o. female presenting today as a new patient with a chief complaint of throbbing aching pain noted to the bilateral feet that began four months ago. She reports associated swelling of the bilateral feet. She states the symptoms began in June in the right foot just before she had back surgery. After the surgery the symptoms began in the left foot as well. Being on her feet increases the pain. She has not had any treatment for the symptoms.    Past Medical History:  Diagnosis Date  . Anxiety   . Asthma   . Back pain   . Hypertension   . Hypothyroid      Physical Exam: General: The patient is alert and oriented x3 in no acute distress.  Dermatology: Skin is warm, dry and supple bilateral lower extremities. Negative for open lesions or macerations.  Vascular: Edema noted to the bilateral lower extremities. Palpable pedal pulses bilaterally. No erythema noted. Capillary refill within normal limits.  Neurological: Epicritic and protective threshold grossly intact bilaterally.   Musculoskeletal Exam: Pain with palpation noted to the bilateral midfoot. Range of motion within normal limits to all pedal and ankle joints bilateral. Muscle strength 5/5 in all groups bilateral.   Radiographic Exam:  Normal osseous mineralization. Joint spaces preserved. No fracture/dislocation/boney destruction.    Assessment: 1. H/o back surgery DOS: 12/29/2018 2. BLE edema 3. Midfoot capsulitis bilateral    Plan of Care:  1. Patient evaluated. X-Rays reviewed.  2. Injection of 0.5 mLs Celestone Soluspan injected into the bilateral midfoot.  3. Compression anklet dispensed bilaterally.  4. Recommended OTC Powerstep Insoles and good tennis shoes. 5. Defer any oral medication at this moment since she is still recovering from back surgery.  6. Return to clinic in 6 weeks.   Preschool teacher.       Edrick Kins, DPM [facility]  Dr. Edrick Kins, DPM    [address], [place] [address]                Office [phone]  Fax [phone]

## 2019-03-27 ENCOUNTER — Other Ambulatory Visit: Payer: Self-pay | Admitting: Obstetrics and Gynecology

## 2019-03-27 ENCOUNTER — Other Ambulatory Visit (HOSPITAL_COMMUNITY)
Admission: RE | Admit: 2019-03-27 | Discharge: 2019-03-27 | Disposition: A | Discharge status: Home / Self Care | Payer: 59 | Source: Ambulatory Visit | Attending: Obstetrics and Gynecology | Admitting: Obstetrics and Gynecology

## 2019-03-27 DIAGNOSIS — Z309 Encounter for contraceptive management, unspecified: Secondary | ICD-10-CM | POA: Insufficient documentation

## 2019-03-28 LAB — CYTOLOGY - PAP
Adequacy: ABSENT
Diagnosis: NEGATIVE
High risk HPV: NEGATIVE
Molecular Disclaimer: 56
Molecular Disclaimer: DETECTED
Molecular Disclaimer: NORMAL

## 2019-03-28 NOTE — Telephone Encounter (Signed)
Put her on the cancellation list for 30 minutes 

## 2019-03-31 ENCOUNTER — Telehealth: Payer: 59 | Admitting: Family

## 2019-03-31 DIAGNOSIS — Z20822 Contact with and (suspected) exposure to covid-19: Secondary | ICD-10-CM

## 2019-03-31 MED ORDER — ALBUTEROL SULFATE HFA 108 (90 BASE) MCG/ACT IN AERS: 2.0000 | INHALATION_SPRAY | Freq: Four times a day (QID) | RESPIRATORY_TRACT | 0 refills | Status: DC | PRN

## 2019-03-31 NOTE — Progress Notes (Signed)
E-Visit for Corona Virus Screening   Your current symptoms could be consistent with the coronavirus.  Many health care providers can now test patients at their office but not all are.  [facility] has multiple testing sites. For information on our COVID testing locations and hours go to [email or web address]  Please quarantine yourself while awaiting your test results.  We are enrolling you in our [place] for [place] . Daily you will receive a questionnaire within the [facility] website. Our COVID 19 response team willl be monitoriing your responses daily.  *If your shortness of breath worsens or does not improve, you need to be seen face to face to rule out more serous problems.  Approximately 5 minutes was spent documenting and reviewing patient's chart.   You can go to one of the  testing sites listed below, while they are opened (see hours). You do not need an order and will stay in your car during the test. You do need to self isolate until your results return and if positive 14 days from when your symptoms started and until you are 3 days symptom free.   Testing Locations (Monday - Friday, 8 a.m. - 3:30 p.m.) . [place]: [facility] at [facility], [address] [place], [place]: [place], [place], [place], [place] (entrance off [address])  . [facility]: (Closed each Monday): Testing site relocated to the short stay covered drive at [facility]. (Use the [facility] entrance to [facility] next to [place] is a respiratory illness with symptoms that are similar to the flu. Symptoms are typically mild to moderate, but there have been cases of severe illness and death due to the virus. The following symptoms may appear 2-14 days after exposure: . Fever . Cough . Shortness of breath or difficulty breathing . Chills . Repeated shaking  with chills . Muscle pain . Headache . Sore throat . New loss of taste or smell . Fatigue . Congestion or runny nose . Nausea or vomiting . Diarrhea  It is vitally important that if you feel that you have an infection such as this virus or any other virus that you stay home and away from places where you may spread it to others.  You should self-quarantine for 14 days if you have symptoms that could potentially be coronavirus or have been in close contact a with a person diagnosed with COVID-19 within the last 2 weeks. You should avoid contact with people age 4 and older.   You should wear a mask or cloth face covering over your nose and mouth if you must be around other people or animals, including pets (even at home). Try to stay at least 6 feet away from other people. This will protect the people around you.  You can use medication such as A prescription inhaler called Albuterol MDI 90 mcg /actuation 2 puffs every 4 hours as needed for shortness of breath, wheezing, cough.  You may also take acetaminophen (Tylenol) as needed for fever.   Reduce your risk of any infection by using the same precautions used for avoiding the common cold or flu:  [place] Kitchen Wash your hands often with soap and warm water for at least 20 seconds.  If soap and water are not readily available, use an alcohol-based hand sanitizer with at least 60% alcohol.  . If coughing or sneezing, cover your mouth and nose by coughing  or sneezing into the elbow areas of your shirt or coat, into a tissue or into your sleeve (not your hands). . Avoid shaking hands with others and consider head nods or verbal greetings only. . Avoid touching your eyes, nose, or mouth with unwashed hands.  . Avoid close contact with people who are sick. . Avoid places or events with large numbers of people in one location, like concerts or sporting events. . Carefully consider travel plans you have or are making. . If you are planning any travel outside  or inside the [place], visit the CDC's Travelers' Health webpage for the latest health notices. . If you have some symptoms but not all symptoms, continue to monitor at home and seek medical attention if your symptoms worsen. . If you are having a medical emergency, call 911.  HOME CARE . Only take medications as instructed by your medical team. . Drink plenty of fluids and get plenty of rest. . A steam or ultrasonic humidifier can help if you have congestion.   GET HELP RIGHT AWAY IF YOU HAVE EMERGENCY WARNING SIGNS** FOR COVID-19. If you or someone is showing any of these signs seek emergency medical care immediately. Call 911 or proceed to your closest emergency facility if: . You develop worsening high fever. . Trouble breathing . Bluish lips or face . Persistent pain or pressure in the chest . New confusion . Inability to wake or stay awake . You cough up blood. . Your symptoms become more severe  **This list is not all possible symptoms. Contact your medical provider for any symptoms that are sever or concerning to you.   MAKE SURE YOU   Understand these instructions.  Will watch your condition.  Will get help right away if you are not doing well or get worse.  Your e-visit answers were reviewed by a board certified advanced clinical practitioner to complete your personal care plan.  Depending on the condition, your plan could have included both over the counter or prescription medications.  If there is a problem please reply once you have received a response from your provider.  Your safety is important to [place].  If you have drug allergies check your prescription carefully.    You can use MyChart to ask questions about today's visit, request a non-urgent call back, or ask for a work or school excuse for 24 hours related to this e-Visit. If it has been greater than 24 hours you will need to follow up with your provider, or enter a new e-Visit to address those concerns. You will get  an e-mail in the next two days asking about your experience.  I hope that your e-visit has been valuable and will speed your recovery. Thank you for using e-visits.

## 2019-04-03 ENCOUNTER — Encounter

## 2019-04-04 ENCOUNTER — Other Ambulatory Visit: Payer: Self-pay

## 2019-04-04 ENCOUNTER — Telehealth (INDEPENDENT_AMBULATORY_CARE_PROVIDER_SITE_OTHER): Payer: 59 | Admitting: Primary Care

## 2019-04-04 ENCOUNTER — Ambulatory Visit (HOSPITAL_COMMUNITY)
Admission: RE | Admit: 2019-04-04 | Discharge: 2019-04-04 | Disposition: A | Discharge status: Home / Self Care | Payer: 59 | Source: Ambulatory Visit | Attending: Primary Care | Admitting: Primary Care

## 2019-04-04 ENCOUNTER — Encounter: Payer: Self-pay | Admitting: Primary Care

## 2019-04-04 ENCOUNTER — Telehealth: Payer: Self-pay | Admitting: Emergency Medicine

## 2019-04-04 DIAGNOSIS — Z20822 Contact with and (suspected) exposure to covid-19: Secondary | ICD-10-CM

## 2019-04-04 DIAGNOSIS — J4540 Moderate persistent asthma, uncomplicated: Secondary | ICD-10-CM | POA: Diagnosis present

## 2019-04-04 MED ORDER — PREDNISONE 10 MG PO TABS: ORAL_TABLET | ORAL | 0 refills | Status: DC

## 2019-04-04 NOTE — Telephone Encounter (Signed)
Spoke with the pt  She states started to have sinus pressure/congestion, HA last wk- did doctor on demand visit and was started on augmentin  She states head congestion is now worse and moving to her chest and she is coughing and wheezing and feeling worse  MYCHART video visit with Beth scheduled for 10:00 am today

## 2019-04-04 NOTE — Progress Notes (Signed)
Virtual Visit via Video Note  I connected with Carolyn Castro on 04/04/19 at 10:00 AM EDT by a video enabled telemedicine application and verified that I am speaking with the correct person using two identifiers.  Location: Patient: Home Provider: Office   I discussed the limitations of evaluation and management by telemedicine and the availability of in person appointments. The patient expressed understanding and agreed to proceed.  History of Present Illness: 38 year old female, never smoked. PMH significant for moderate persistent asthma. Patient of Dr. Lamonte Sakai, last seen on 02/20/19. Maintained on Symbicort 160.   04/04/2019 Patient contacted today for acute symptoms of sinus pressure/congestion and cough.  Associated shortness of breath, chest tightness, fatigue and low-grade temperature. Reports symptoms started on 03/25/19, she saw an UC doctor and was given prescription for Augmentin on Sunday 03/31/19. She reports no improvement in 4 days since being on antibiotic. Continues Symbicort 160 twice daily as prescribed, she has been using her rescue inhaler 3-4 times a day with improvement. She is using Nasacort daily.   Observations/Objective:  - No significant shortness of breath, wheezing or cough noted during video visit  - Able to speak in full sentences   Assessment and Plan:  Asthma exacerbation with acute sinusitis - Continue Augmentin as previously prescribed - RX prednisone taper - Advised mucinex twice daily - Nasal saline sinus followed by Nasacort   - Order CXR today and COVID testing   Follow Up Instructions:   - FU video visit in 5 days  I discussed the assessment and treatment plan with the patient. The patient was provided an opportunity to ask questions and all were answered. The patient agreed with the plan and demonstrated an understanding of the instructions.   The patient was advised to call back or seek an in-person evaluation if the symptoms worsen or if  the condition fails to improve as anticipated.  I provided 18 minutes of non-face-to-face time during this encounter.   Martyn Ehrich, NP

## 2019-04-04 NOTE — Patient Instructions (Signed)
Recommendations: - Use nasal sinus rinse twice daily (netti pot or ocean nasal spray); followed by Nasacort  - Take Mucinex twice daily for congestion - Continue Symbicort twice daily - Use albuterol rescue inhaler 2 puffs every 4-6 hours as needed for shortness of breath/wheezing  Rx: Prednisone taper as prescribed (40mg  x 3 days; 30mg  x 3 days; 20mg  x 3 days; 10mg  x 3 days)  Follow-up: 5 days televisit with NP Needs letter for out of work today and tomorrow

## 2019-04-05 ENCOUNTER — Ambulatory Visit: Payer: 59 | Admitting: Neurology

## 2019-04-05 LAB — NOVEL CORONAVIRUS, NAA: SARS-CoV-2, NAA: NOT DETECTED

## 2019-04-05 NOTE — Progress Notes (Signed)
I sent results through [facility]

## 2019-04-05 NOTE — Progress Notes (Signed)
LMOMTCB x 1 

## 2019-04-05 NOTE — Progress Notes (Signed)
Covid negative, send mychart message

## 2019-04-08 NOTE — Telephone Encounter (Signed)
Spoke to the pt over the phone. She has been moved to the 1330 slot on 04/10/2019. Nothing further was needed.

## 2019-04-09 ENCOUNTER — Telehealth: Payer: Self-pay | Admitting: Psychiatry

## 2019-04-09 ENCOUNTER — Other Ambulatory Visit: Payer: Self-pay | Admitting: Psychiatry

## 2019-04-09 NOTE — Telephone Encounter (Signed)
Pt called to report anxiety is getting worse. Have appt 11/4 and on canc list . Please advise if any thing can be done before appt.

## 2019-04-09 NOTE — Telephone Encounter (Signed)
Schedule her for Friday as a work in.  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

## 2019-04-10 ENCOUNTER — Encounter: Payer: Self-pay | Admitting: Primary Care

## 2019-04-10 ENCOUNTER — Telehealth (INDEPENDENT_AMBULATORY_CARE_PROVIDER_SITE_OTHER): Payer: 59 | Admitting: Primary Care

## 2019-04-10 DIAGNOSIS — J4541 Moderate persistent asthma with (acute) exacerbation: Secondary | ICD-10-CM

## 2019-04-10 MED ORDER — PREDNISONE 10 MG PO TABS: ORAL_TABLET | ORAL | 0 refills | Status: DC

## 2019-04-10 NOTE — Progress Notes (Signed)
Virtual Visit via Video Note  I connected with Carolyn Castro on 04/10/19 at  1:30 PM EDT by a video enabled telemedicine application and verified that I am speaking with the correct person using two identifiers.  Location: Patient: Home Provider: Office   I discussed the limitations of evaluation and management by telemedicine and the availability of in person appointments. The patient expressed understanding and agreed to proceed.  History of Present Illness: 38 year old female, never smoked. PMH significant for moderate persistent asthma. Patient of Dr. Lamonte Sakai, last seen on 02/20/19. Maintained on Symbicort 160.   Previous [facility] pulmonary encounter: 04/04/2019 Patient contacted today for acute symptoms of sinus pressure/congestion and cough.  Associated shortness of breath, chest tightness, fatigue and low-grade temperature. Reports symptoms started on 03/25/19, she saw an UC doctor and was given prescription for Augmentin on Sunday 03/31/19. She reports no improvement in 4 days since being on antibiotic. Continues Symbicort 160 twice daily as prescribed, she has been using her rescue inhaler 3-4 times a day with improvement. She is using Nasacort daily.   04/10/2019 Patient contacted today for video visit. She is feeling slightly better but is still having some sinus pressure and yellow/white drainage. Reports associated chest tightness and wheezing. Taking Symbicort 160 as prescribed, She has had to use her rescue inhaler twice a day. Compliant with mucinex and nasal rinses. CXR was normal. Covid negative.    Observations/Objective:  - No significant shortness of breath, wheezing or cough noted   Assessment and Plan:  Asthma exacerbation with acute sinusitis - Improved but not 100% better after completing Augmentin course - Additional prednisone 20mg  x 5 days  - Continue mucinex twice daily and saline rinses  - If not better by the end of the week consider change antibiotic therapy    Follow Up Instructions:    I discussed the assessment and treatment plan with the patient. The patient was provided an opportunity to ask questions and all were answered. The patient agreed with the plan and demonstrated an understanding of the instructions.   The patient was advised to call back or seek an in-person evaluation if the symptoms worsen or if the condition fails to improve as anticipated.  I provided 12 minutes of non-face-to-face time during this encounter.   Martyn Ehrich, NP

## 2019-04-10 NOTE — Patient Instructions (Addendum)
Additional prednisone 20mg  x 5 days Continue Mucinex twice daily and Saline nasal rinses If symptoms worsen or do not continue to improve call office may consider changing antibitoic

## 2019-04-11 NOTE — Telephone Encounter (Signed)
Has work in  appt  Friday 04/12/19 @ 1:30

## 2019-04-12 ENCOUNTER — Encounter: Payer: Self-pay | Admitting: Psychiatry

## 2019-04-12 ENCOUNTER — Ambulatory Visit (INDEPENDENT_AMBULATORY_CARE_PROVIDER_SITE_OTHER): Payer: 59 | Admitting: Psychiatry

## 2019-04-12 ENCOUNTER — Other Ambulatory Visit: Payer: Self-pay

## 2019-04-12 DIAGNOSIS — F4001 Agoraphobia with panic disorder: Secondary | ICD-10-CM

## 2019-04-12 DIAGNOSIS — F5105 Insomnia due to other mental disorder: Secondary | ICD-10-CM

## 2019-04-12 DIAGNOSIS — F325 Major depressive disorder, single episode, in full remission: Secondary | ICD-10-CM

## 2019-04-12 MED ORDER — PAROXETINE HCL 30 MG PO TABS: 60.0000 mg | ORAL_TABLET | Freq: Every day | ORAL | 1 refills | Status: DC

## 2019-04-12 NOTE — Progress Notes (Signed)
Carolyn Castro [id number] 1980-08-22 38 y.o.    Subjective:   Patient ID:  Carolyn Castro is a 38 y.o. (DOB April 03, 1981) female.  Chief Complaint:  Chief Complaint  Patient presents with  . Follow-up  . Anxiety    HPI Carolyn Castro presents to the office today for follow-up of panic disorder and depression.    When seen September 26, 2017 she had previously been switched from sertraline to paroxetine 40 mg for panic attacks.  She had a good response until a recent bout of health problems had noticed a resurgence of panic.  We decided to increase paroxetine to greater than the usual 60 mg daily from 40 mg daily on August 23, 2018.    Last seen May 2020.Health gotten better and anxiety has come down.  Increase in paroxetine has helped her also.  No med changes were made.  She called back in June saying that her anxiety and insomnia was worse and she was having to take steroids for asthma.  She was allowed to increase Xanax to 0.5 mg #5 daily.  Encouraged to reduce the dose as soon as possible.  It was discovered that she was also getting Xanax from another provider and she was cautioned about that as being inappropriate. Subsequently she is gotten Xanax from another provider on more than one occasion. She called April 09, 2019 in the catering her anxiety was worse.  She was worked in to an appointment today.  PDMP shows she is getting Zen Xanax 1 mg quantity 30 from PCP, Dr. Maurice Small and oxycodone 5 mg tablets and quantities of 40-60 from [facility].   She last filled a Xanax prescription from our office in March.  Says she's stopped pain meds.   Last fill date 8/27.  Had low back surgery since here end of June.  Back at work since end of August.  Doesn't like her job bc they always have her on edge.  They are demeaning.    For unknown reasons she accidentally decreased the paroxetine and now she can't remember if she's taking 20 or 40 mg daily but it's not 60 mg as in  the past since at least July.  Sleeps better with  trazodone.    Past psych meds:  Zoloft 200 NR, , paxil 60, Xanax., hydroxyzine, trazodone helped.  Review of Systems:  Review of Systems  Respiratory: Positive for shortness of breath and wheezing. Negative for cough and chest tightness.   Gastrointestinal: Positive for abdominal pain.  Musculoskeletal: Positive for back pain.  Neurological: Negative for tremors and weakness.  Psychiatric/Behavioral: Negative for agitation, behavioral problems, confusion, decreased concentration, dysphoric mood, hallucinations, self-injury, sleep disturbance and suicidal ideas. The patient is not nervous/anxious and is not hyperactive.   asthma about the same.  Medications: I have reviewed the patient's current medications.  Current Outpatient Medications  Medication Sig Dispense Refill  . acetaminophen (TYLENOL) 500 MG tablet Take 1,000 mg by mouth every 6 (six) hours as needed (for pain).     Marland Kitchen albuterol (VENTOLIN HFA) 108 (90 Base) MCG/ACT inhaler Inhale 2 puffs into the lungs every 6 (six) hours as needed for wheezing or shortness of breath. 8 g 0  . ALPRAZolam (XANAX) 0.5 MG tablet Take 1 tablet (0.5 mg total) by mouth 3 (three) times daily as needed for anxiety. (Patient taking differently: Take 0.5 mg by mouth 2 (two) times daily as needed for anxiety. ) 90 tablet 1  . ALPRAZolam (XANAX) 1 MG  tablet TK 1/2 T PO BID PRN    . amLODipine (NORVASC) 10 MG tablet Take 10 mg by mouth daily.    . budesonide-formoterol (SYMBICORT) 160-4.5 MCG/ACT inhaler Inhale 2 puffs into the lungs 2 (two) times daily. 1 Inhaler 5  . cholecalciferol (VITAMIN D3) 25 MCG (1000 UT) tablet Take 1,000 Units by mouth daily.    Marland Kitchen. CRANBERRY PO Take 1 tablet by mouth 2 (two) times daily.     . cyclobenzaprine (FLEXERIL) 10 MG tablet TK 1 T PO QD HS PRN    . diphenhydrAMINE (BENADRYL) 25 MG tablet Take 50 mg by mouth as needed for itching or allergies.     Tery Sanfilippo. Docusate Sodium (STOOL  SOFTENER) 100 MG capsule Take 100 mg by mouth 2 (two) times daily.    . ferrous sulfate 325 (65 FE) MG tablet Take 1 tablet (325 mg total) by mouth 2 (two) times daily with a meal. 60 tablet 0  . fexofenadine (ALLEGRA) 180 MG tablet Take 180 mg by mouth daily.    Marland Kitchen. gabapentin (NEURONTIN) 100 MG capsule TK 1 C PO TID PRN    . ibuprofen (ADVIL,MOTRIN) 200 MG tablet Take 800 mg by mouth every 6 (six) hours as needed (for pain).     Marland Kitchen. levothyroxine (SYNTHROID, LEVOTHROID) 75 MCG tablet Take 75 mcg by mouth daily before breakfast.    . mometasone (NASONEX) 50 MCG/ACT nasal spray Place 2 sprays into the nose daily.    . montelukast (SINGULAIR) 10 MG tablet Take 10 mg by mouth every evening.     . norethindrone-ethinyl estradiol (MICROGESTIN,JUNEL,LOESTRIN) 1-20 MG-MCG tablet Take 1 tablet by mouth daily.    Marland Kitchen. omeprazole (PRILOSEC OTC) 20 MG tablet Take 20 mg by mouth 2 (two) times daily.     Marland Kitchen. omeprazole (PRILOSEC) 20 MG capsule TK 1 C PO BID    . ondansetron (ZOFRAN-ODT) 4 MG disintegrating tablet DIS 1 T ON THE TONGUE QD PRF NAUSEA    . oxyCODONE (OXY IR/ROXICODONE) 5 MG immediate release tablet Take 1 tablet (5 mg total) by mouth every 6 (six) hours as needed for moderate pain or severe pain. 24 tablet 0  . PAROXETINE HCL PO Take by mouth. unkown by patient re dos but not 60 mg daily    . predniSONE (DELTASONE) 10 MG tablet Take 2 tabs x 5 days 10 tablet 0  . Probiotic Product (PROBIOTIC DAILY PO) Take 1 tablet by mouth daily.    Marland Kitchen. senna (SENOKOT) 8.6 MG TABS tablet Take 1 tablet (8.6 mg total) by mouth daily as needed for mild constipation or moderate constipation. 10 tablet 0  . traZODone (DESYREL) 50 MG tablet TAKE 1 TO 2 TABLETS(50 TO 100 MG) BY MOUTH AT BEDTIME 60 tablet 2  . vitamin B-12 1000 MCG tablet Take 1 tablet (1,000 mcg total) by mouth daily. 30 tablet 0  . PARoxetine (PAXIL) 30 MG tablet Take 2 tablets (60 mg total) by mouth daily. 60 tablet 1   No current facility-administered  medications for this visit.     Medication Side Effects: None  Allergies:  Allergies  Allergen Reactions  . Ciprofloxacin Hcl Diarrhea and Other (See Comments)    Developed C-Diff    Past Medical History:  Diagnosis Date  . Anxiety   . Asthma   . Back pain   . Hypertension   . Hypothyroid     Family History  Problem Relation Age of Onset  . Hypertension Mother   . Hypertension Father  Social History   Socioeconomic History  . Marital status: Single    Spouse name: Not on file  . Number of children: Not on file  . Years of education: Not on file  . Highest education level: Not on file  Occupational History  . Not on file  Social Needs  . Financial resource strain: Not on file  . Food insecurity    Worry: Not on file    Inability: Not on file  . Transportation needs    Medical: Not on file    Non-medical: Not on file  Tobacco Use  . Smoking status: Never Smoker  . Smokeless tobacco: Never Used  Substance and Sexual Activity  . Alcohol use: Never    Frequency: Never  . Drug use: Never  . Sexual activity: Not Currently  Lifestyle  . Physical activity    Days per week: Not on file    Minutes per session: Not on file  . Stress: Not on file  Relationships  . Social Musician on phone: Not on file    Gets together: Not on file    Attends religious service: Not on file    Active member of club or organization: Not on file    Attends meetings of clubs or organizations: Not on file    Relationship status: Not on file  . Intimate partner violence    Fear of current or ex partner: Not on file    Emotionally abused: Not on file    Physically abused: Not on file    Forced sexual activity: Not on file  Other Topics Concern  . Not on file  Social History Narrative  . Not on file    Past Medical History, Surgical history, Social history, and Family history were reviewed and updated as appropriate.   Please see review of systems for further  details on the patient's review from today.   Objective:   Physical Exam:  There were no vitals taken for this visit.  Physical Exam Constitutional:      General: She is not in acute distress.    Appearance: She is well-developed. She is obese.  Neurological:     Mental Status: She is alert and oriented to person, place, and time.     Cranial Nerves: No dysarthria.  Psychiatric:        Attention and Perception: She is attentive. She does not perceive auditory hallucinations.        Mood and Affect: Mood is anxious. Mood is not depressed. Affect is not labile, blunt, angry or inappropriate.        Speech: Speech normal. Speech is not slurred.        Behavior: Behavior normal. Behavior is not slowed.        Thought Content: Thought content normal. Thought content does not include homicidal or suicidal ideation. Thought content does not include homicidal or suicidal plan.        Cognition and Memory: Cognition normal.        Judgment: Judgment normal.     Comments: Insight intact. No auditory or visual hallucinations. Anxiety worse.     Lab Review:     Component Value Date/Time   NA 138 01/03/2019 0518   K 4.2 01/03/2019 0518   CL 103 01/03/2019 0518   CO2 24 01/03/2019 0518   GLUCOSE 92 01/03/2019 0518   BUN 12 01/03/2019 0518   CREATININE 0.71 01/03/2019 0518   CALCIUM 8.5 (L) 01/03/2019 0518  PROT 6.6 01/03/2019 0518   ALBUMIN 3.1 (L) 01/03/2019 0518   AST 17 01/03/2019 0518   ALT 29 01/03/2019 0518   ALKPHOS 53 01/03/2019 0518   BILITOT 0.5 01/03/2019 0518   GFRNONAA >60 01/03/2019 0518   GFRAA >60 01/03/2019 0518       Component Value Date/Time   WBC 13.2 (H) 01/03/2019 0518   RBC 3.83 (L) 01/03/2019 0518   RBC 3.83 (L) 01/03/2019 0518   HGB 11.0 (L) 01/03/2019 0518   HCT 36.0 01/03/2019 0518   PLT 269 01/03/2019 0518   MCV 94.0 01/03/2019 0518   MCH 28.7 01/03/2019 0518   MCHC 30.6 01/03/2019 0518   RDW 13.7 01/03/2019 0518   LYMPHSABS 3.3 01/02/2019  0329   MONOABS 0.8 01/02/2019 0329   EOSABS 0.1 01/02/2019 0329   BASOSABS 0.0 01/02/2019 0329    No results found for: POCLITH, LITHIUM   No results found for: PHENYTOIN, PHENOBARB, VALPROATE, CBMZ   .res Assessment: Plan:    Panic disorder with agoraphobia - Plan: PARoxetine (PAXIL) 30 MG tablet  Major depressive disorder with single episode, in full remission (HCC) - Plan: PARoxetine (PAXIL) 30 MG tablet  Insomnia due to mental condition    History of severe panic disorder and mild to moderate major depression which did not respond to sertraline 200 mg a day.  She accidentally reduced the dosage a couple of months ago and now the anxiety is out of hand again. Therefore  Increase the paroxetine to greater than usual dose at 60 to try to limit the need of Bz.  Increase in paroxetine was effective at further reducing anxiety below the level she had at 40 mg a day.   We discussed the short-term risks associated with benzodiazepines including sedation and increased fall risk among others.  Discussed long-term side effect risk including dependence, potential withdrawal symptoms, and the potential eventual dose-related risk of dementia.  Disc risk with opiates.  She is not planning on consistent opiates.  Disc risk of increasing the Xanax.  For sleep continue trazodone 50-100 mg HS.  Follow-up 2 mos.  She agrees with the plan  Meredith Staggers MD, DFAPA  Please see After Visit Summary for patient specific instructions.  Future Appointments  Date Time Provider Department Center  04/29/2019  8:15 AM Felecia Shelling, DPM [facility]  05/08/2019 11:30 AM Cottle, Steva Ready., MD [facility] None    No orders of the defined types were placed in this encounter.     -------------------------------

## 2019-04-29 ENCOUNTER — Encounter: Payer: Self-pay | Admitting: Podiatry

## 2019-04-29 ENCOUNTER — Other Ambulatory Visit: Payer: Self-pay

## 2019-04-29 ENCOUNTER — Ambulatory Visit: Payer: 59 | Admitting: Podiatry

## 2019-04-29 DIAGNOSIS — M25471 Effusion, right ankle: Secondary | ICD-10-CM

## 2019-04-29 DIAGNOSIS — M25472 Effusion, left ankle: Secondary | ICD-10-CM

## 2019-04-29 DIAGNOSIS — M778 Other enthesopathies, not elsewhere classified: Secondary | ICD-10-CM

## 2019-04-29 MED ORDER — MELOXICAM 15 MG PO TABS: 15.0000 mg | ORAL_TABLET | Freq: Every day | ORAL | 1 refills | Status: DC

## 2019-05-01 NOTE — Progress Notes (Signed)
   HPI: 38 y.o. female presenting today for follow up evaluation of bilateral lower extremity edema and bilateral midfoot capsulitis. She reports a decrease in the swelling but reports continued pain of the feet. She has been using the insoles and compression anklet as directed. Being on the feet excessively increases the pain. She has not had any treatment for the symptoms.    Past Medical History:  Diagnosis Date  . Anxiety   . Asthma   . Back pain   . Hypertension   . Hypothyroid      Physical Exam: General: The patient is alert and oriented x3 in no acute distress.  Dermatology: Skin is warm, dry and supple bilateral lower extremities. Negative for open lesions or macerations.  Vascular: Edema noted to the bilateral lower extremities. Palpable pedal pulses bilaterally. No erythema noted. Capillary refill within normal limits.  Neurological: Epicritic and protective threshold grossly intact bilaterally.   Musculoskeletal Exam: Pain with palpation noted to the bilateral midfoot. Range of motion within normal limits to all pedal and ankle joints bilateral. Muscle strength 5/5 in all groups bilateral.   Assessment: 1. H/o back surgery DOS: 12/29/2018 - cleared from surgery  2. BLE edema - improved  3. Midfoot capsulitis bilateral    Plan of Care:  1. Patient evaluated.   2. Injection of 0.5 mLs Celestone Soluspan injected into the bilateral midfoot.  3. Prescription for Meloxicam provided to patient. 4. Continue using OTC Powerstep insoles.  5. Continue using compression anklet.  6. Return to clinic as needed.    Preschool teacher.       Edrick Kins, DPM [facility]  Dr. Edrick Kins, DPM    [address], [place] [address]                Office [phone]  Fax [phone]

## 2019-05-02 ENCOUNTER — Telehealth: Payer: Self-pay | Admitting: Emergency Medicine

## 2019-05-02 DIAGNOSIS — Z20822 Contact with and (suspected) exposure to covid-19: Secondary | ICD-10-CM

## 2019-05-02 NOTE — Telephone Encounter (Signed)
Spoke with the pt  She states that she has had a sinus infection for the past 10 days- runny nose, nasal congestion, dry cough  She denies SOB, f/c/s, body aches  She states that her coworker that she was last exposed to on Monday 04/29/19 just learned today that she has covid  Pt wants to be scheduled for covid testing  Please advise thanks

## 2019-05-02 NOTE — Telephone Encounter (Signed)
Yes should be tested.   Was seen by [facility] earlier this month for sinus infection , if not better can place on Beth schedule tomorrow for video visit .  Can also place order for COVID testing tomorrow .   Please contact office for sooner follow up if symptoms do not improve or worsen or seek emergency care

## 2019-05-02 NOTE — Telephone Encounter (Signed)
Order for Covid testing placed  LMTCB to make the pt aware of TP's recs and offer video visit with Beth for 05/03/19  Will hold in triage to f/u on in the am

## 2019-05-03 ENCOUNTER — Telehealth (INDEPENDENT_AMBULATORY_CARE_PROVIDER_SITE_OTHER): Payer: 59 | Admitting: Primary Care

## 2019-05-03 ENCOUNTER — Other Ambulatory Visit: Payer: Self-pay

## 2019-05-03 ENCOUNTER — Encounter: Payer: Self-pay | Admitting: Primary Care

## 2019-05-03 DIAGNOSIS — J0111 Acute recurrent frontal sinusitis: Secondary | ICD-10-CM

## 2019-05-03 DIAGNOSIS — Z20822 Contact with and (suspected) exposure to covid-19: Secondary | ICD-10-CM

## 2019-05-03 MED ORDER — PREDNISONE 10 MG PO TABS: ORAL_TABLET | ORAL | 0 refills | Status: DC

## 2019-05-03 NOTE — Telephone Encounter (Signed)
Call made to patient, confirmed DOB, aware covid test order has been placed. Mychart appt made for 10/30. Nothing further needed at this time.

## 2019-05-03 NOTE — Patient Instructions (Addendum)
Pleasure speaking with you today, I'm sorry you are still not feeling well  Recommendations: Double strength Mucinex twice a day Continue nasal saline rinses twice a day Continue Nasocort daily Extended prednisone taper  Call ENT and make an appointment with them Awaiting COVID testing   RX: Prednisone taper   Follow-up: If unable to get ENT appointment follow-up with [place] in office

## 2019-05-03 NOTE — Progress Notes (Signed)
Virtual Visit via Video Note  I connected with Carolyn Castro on 05/03/19 at 11:30 AM EDT by a video enabled telemedicine application and verified that I am speaking with the correct person using two identifiers.  Location: Patient: Home Provider: Office   I discussed the limitations of evaluation and management by telemedicine and the availability of in person appointments. The patient expressed understanding and agreed to proceed.  History of Present Illness: 38 year old female, never smoked. PMH significant for moderate persistent asthma. Patient of Dr. Lamonte Sakai, last seen on 02/20/19. Maintained on Symbicort 160.   Previous [facility] pulmonary encounter: 04/04/2019 Patient contacted today for acute symptoms of sinus pressure/congestion and cough. Associated shortness of breath, chest tightness, fatigue and low-grade temperature. Reports symptoms started on 03/25/19, she saw an UC doctor and was given prescription for Augmentin on Sunday 03/31/19. She reports no improvement in 4 days since being on antibiotic. Continues Symbicort 160 twice daily as prescribed, she has been using her rescue inhaler 3-4 times a day with improvement. She is using Nasacort daily.   04/10/2019 Patient contacted today for video visit. She is feeling slightly better but is still having some sinus pressure and yellow/white drainage. Reports associated chest tightness and wheezing. Taking Symbicort 160 as prescribed, She has had to use her rescue inhaler twice a day. Compliant with mucinex and nasal rinses. CXR was normal. Covid negative.  Additional prednisone.  05/03/2019 Patietn contacted today for video visit, she continues to have persistent sinusitis symptoms. She has been treated with Augmentin earlier this month with no improvement. She went to an urgent care in [place] last week and was given doxycycline which she completed on Wednesday 10/28.  She reports severe sinus pressure and headache.  She has nasal and ear  drainage. States that she gets up white to green sputum.  Associated chest tightness and congestion.  She is taking decongestants and Tylenol which helped for a couple of hours.  She had a chest x-ray earlier this month that appeared normal.  She does have an ear nose and throat doctor in [place]. Of note she was exposed to Covid 4 days ago on 10/26 at work.  She had Covid testing this morning which is pending.  Observations/Objective:  -Appears stable. No significant shortness of breath wheezing or cough during phone conversation.  She has some audible nasal congestion.  Assessment and Plan:  Acute recurrent frontal sinusitis: - Persistent symptoms over the last month, no improvement with Augmentin or Doxycycline - Continue nasal saline rinses twice daily and Nasacort  - Advised Mucinex DS twice daily - Recommend follow-up with ENT - COVID testing pending    Moderate persistent asthma: - Continue Symbicort 160 twice daily - RX extended prednisone taper   Follow Up Instructions:   -  If unable to get ENT appointment follow-up with [place] in office   I discussed the assessment and treatment plan with the patient. The patient was provided an opportunity to ask questions and all were answered. The patient agreed with the plan and demonstrated an understanding of the instructions.   The patient was advised to call back or seek an in-person evaluation if the symptoms worsen or if the condition fails to improve as anticipated.  I provided 20 minutes of non-face-to-face time during this encounter.   Martyn Ehrich, NP

## 2019-05-04 LAB — NOVEL CORONAVIRUS, NAA: SARS-CoV-2, NAA: NOT DETECTED

## 2019-05-08 ENCOUNTER — Telehealth: Payer: Self-pay | Admitting: Emergency Medicine

## 2019-05-08 ENCOUNTER — Ambulatory Visit: Payer: Self-pay | Admitting: Psychiatry

## 2019-05-08 NOTE — Telephone Encounter (Signed)
Okay, noted.  Wyn Quaker, FNP

## 2019-05-08 NOTE — Telephone Encounter (Signed)
Called spoke with patient who complains of hoarseness, a "piercing" discomfort at the base of her lungs when she has a dry deep cough x1+ weeks, with chills, sweats, body aches, head congestion with green and white mucus and PND.  States was given prednisone at 10.30.2020 video visit with [facility] NP that has not helped.  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    Unable to schedule appt back with [facility] because convenient time for patient was not available.  MyChart video visit scheduled with Aaron Edelman NP for 12N on 11.5.2020 to reassess symptoms.  Will sign and forward to Aaron Edelman to make him aware.

## 2019-05-09 ENCOUNTER — Emergency Department (HOSPITAL_COMMUNITY): Payer: 59

## 2019-05-09 ENCOUNTER — Encounter (HOSPITAL_COMMUNITY): Payer: Self-pay | Admitting: Emergency Medicine

## 2019-05-09 ENCOUNTER — Telehealth (INDEPENDENT_AMBULATORY_CARE_PROVIDER_SITE_OTHER): Payer: 59 | Admitting: Pulmonary Disease

## 2019-05-09 ENCOUNTER — Other Ambulatory Visit: Payer: Self-pay

## 2019-05-09 ENCOUNTER — Emergency Department (HOSPITAL_COMMUNITY)
Admission: EM | Admit: 2019-05-09 | Discharge: 2019-05-09 | Disposition: A | Discharge status: Home / Self Care | Payer: 59 | Attending: Emergency Medicine | Admitting: Emergency Medicine

## 2019-05-09 ENCOUNTER — Encounter: Payer: Self-pay | Admitting: Pulmonary Disease

## 2019-05-09 DIAGNOSIS — R0602 Shortness of breath: Secondary | ICD-10-CM

## 2019-05-09 DIAGNOSIS — E039 Hypothyroidism, unspecified: Secondary | ICD-10-CM | POA: Insufficient documentation

## 2019-05-09 DIAGNOSIS — R0981 Nasal congestion: Secondary | ICD-10-CM | POA: Insufficient documentation

## 2019-05-09 DIAGNOSIS — Z8709 Personal history of other diseases of the respiratory system: Secondary | ICD-10-CM | POA: Insufficient documentation

## 2019-05-09 DIAGNOSIS — J4541 Moderate persistent asthma with (acute) exacerbation: Secondary | ICD-10-CM | POA: Diagnosis not present

## 2019-05-09 DIAGNOSIS — I10 Essential (primary) hypertension: Secondary | ICD-10-CM | POA: Diagnosis not present

## 2019-05-09 DIAGNOSIS — Z79899 Other long term (current) drug therapy: Secondary | ICD-10-CM | POA: Diagnosis not present

## 2019-05-09 DIAGNOSIS — R0789 Other chest pain: Secondary | ICD-10-CM | POA: Diagnosis not present

## 2019-05-09 DIAGNOSIS — J029 Acute pharyngitis, unspecified: Secondary | ICD-10-CM | POA: Diagnosis not present

## 2019-05-09 DIAGNOSIS — J301 Allergic rhinitis due to pollen: Secondary | ICD-10-CM

## 2019-05-09 DIAGNOSIS — J0111 Acute recurrent frontal sinusitis: Secondary | ICD-10-CM | POA: Diagnosis not present

## 2019-05-09 DIAGNOSIS — R0600 Dyspnea, unspecified: Secondary | ICD-10-CM | POA: Insufficient documentation

## 2019-05-09 DIAGNOSIS — M546 Pain in thoracic spine: Secondary | ICD-10-CM | POA: Insufficient documentation

## 2019-05-09 LAB — BASIC METABOLIC PANEL
Anion gap: 11 (ref 5–15)
BUN: 12 mg/dL (ref 6–20)
CO2: 24 mmol/L (ref 22–32)
Calcium: 8.7 mg/dL — ABNORMAL LOW (ref 8.9–10.3)
Chloride: 100 mmol/L (ref 98–111)
Creatinine, Ser: 1.16 mg/dL — ABNORMAL HIGH (ref 0.44–1.00)
GFR calc Af Amer: >60 mL/min (ref >60)
GFR calc non Af Amer: >60 mL/min (ref >60)
Glucose, Bld: 88 mg/dL (ref 70–99)
Potassium: 3.4 mmol/L — ABNORMAL LOW (ref 3.5–5.1)
Sodium: 135 mmol/L (ref 135–145)

## 2019-05-09 LAB — CBC
HCT: 43.4 % (ref 36.0–46.0)
Hemoglobin: 14.2 g/dL (ref 12.0–15.0)
MCH: 29 pg (ref 26.0–34.0)
MCHC: 32.7 g/dL (ref 30.0–36.0)
MCV: 88.6 fL (ref 80.0–100.0)
Platelets: 439 10*3/uL — ABNORMAL HIGH (ref 150–400)
RBC: 4.9 MIL/uL (ref 3.87–5.11)
RDW: 14.2 % (ref 11.5–15.5)
WBC: 12.5 10*3/uL — ABNORMAL HIGH (ref 4.0–10.5)
nRBC: 0 % (ref 0.0–0.2)

## 2019-05-09 LAB — TROPONIN I (HIGH SENSITIVITY)
Troponin I (High Sensitivity): 4 ng/L (ref <18)
Troponin I (High Sensitivity): 3 ng/L (ref <18)

## 2019-05-09 LAB — I-STAT BETA HCG BLOOD, ED (MC, WL, AP ONLY): I-stat hCG, quantitative: <5 m[IU]/mL (ref <5)

## 2019-05-09 MED ORDER — DOXYCYCLINE HYCLATE 100 MG PO TABS: 100.0000 mg | ORAL_TABLET | Freq: Two times a day (BID) | ORAL | 0 refills | Status: DC

## 2019-05-09 MED ORDER — DOXYCYCLINE HYCLATE 100 MG PO TABS
100.0000 mg | ORAL_TABLET | Freq: Once | ORAL | Status: AC
  Administered 2019-05-09: 100 mg via ORAL
  Filled 2019-05-09: qty 1

## 2019-05-09 MED ORDER — IOHEXOL 350 MG/ML SOLN
100.0000 mL | Freq: Once | INTRAVENOUS | Status: AC | PRN
  Administered 2019-05-09: 100 mL via INTRAVENOUS

## 2019-05-09 MED ORDER — SODIUM CHLORIDE 0.9% FLUSH: 3.0000 mL | Freq: Once | INTRAVENOUS | Status: DC

## 2019-05-09 NOTE — Discharge Instructions (Signed)
CT scan did not show any signs of blood clot.  It does show possible pneumonia.  Take the antibiotics as prescribed.  Follow-up with your pulmonary doctor.

## 2019-05-09 NOTE — Progress Notes (Signed)
Virtual Visit via Video Note  I connected with Carolyn Castro on 05/09/19 at 12:00 PM EST by a video enabled telemedicine application and verified that I am speaking with the correct person using two identifiers.  Location: Patient: Home Provider: Office - [facility] - [address] [place], [address], [place], [place] [address]  I discussed the limitations of evaluation and management by telemedicine and the availability of in person appointments. The patient expressed understanding and agreed to proceed. I also discussed with the patient that there may be a patient responsible charge related to this service. The patient expressed understanding and agreed to proceed.  Patient consented to consult via telephone: Yes People present and their role in pt care: Pt   History of Present Illness:  38 year old never smoker followed in our office for asthma recurrent sinusitis  Past medical history: Hypothyroidism, obesity, hypertension, GERD, depression, panic disorder Smoking history: Never smoker Maintenance: Symbicort 160 Patient of Dr. Delton Coombes  Chief complaint:    Patient recently completed a video visit on 05/03/2019.  Patient has also had multiple patient has completed 3 acute care visits with our office in the last month.  Patient is also per the charting gone to multiple urgent cares to receive antibiotics.  Patient was encouraged on 05/02/2021 follow-up with the ENT.  Patient has had recent antibiotic use for both doxycycline as well as Augmentin from urgent care.  Patient has an allergy to ciprofloxacin patient reports that this caused her to develop C. Diff.   Patient is recently completed negative Covid testing.  She is also had a negative Covid test about 4 weeks ago.  Patient reports that she is followed up with her ENT and she has an appointment in 1 week with Dr. Jearld Fenton.  She continues to have green nasal drainage, facial pain, sinus pressure.  She also feels persistently short of  breath.  She does not feel that her symptoms are improving.  She is also now starting to develop lung pain and chest tightness.  This is despite being on prednisone taper as well as being adherent to Symbicort 160.  She continues to also use her Allegra.  Patient is so concerned with her symptoms that she is driven from [place] and is currently doing a video visit in her car on the side of [address] because she is considering going to the emergency room.  Observations/Objective:  01/02/2019-CTA chest-mild pneumonia seen in left lower and right upper lobes, left lower lobe opacity has segmental appearance favors bacterial pneumonia  12/26/2018-CT head-sinuses.  Nasal sinuses and mastoid air cells are clear, orbits are clear  01/30/2019-pulmonary function test-FVC 2.88 (77% predicted), postbronchodilator ratio 76, postbronchodilator FEV1 2.13 (69% predicted), no positive bronchodilator response, no bronchodilator response, DLCO 23.37 (105% predicted)  01/08/2019-Home sleep study-AHI 4.7, SaO2 low 77% time with saturation less than 85% was 58 minutes, indicating nocturnal hypoxemia  12/27/2018-echocardiogram-LV ejection fraction 60 to 65%, cavity size was normal  Assessment and Plan:  Asthma Plan: Present to the emergency room for further evaluation Need to consider chest x-ray as well as CT sinus imaging Need to also consider baseline lab work Continue to take prednisone as prescribed Continue take Symbicort 160 as prescribed Need to have follow-up in office in 4 to 6 weeks  Allergic rhinitis Plan: Continue Nasonex Continue Allegra Continue Singulair Increase nasal saline rinses to at least 2 times daily  Acute recurrent frontal sinusitis Persistent recurrent symptoms Recent antibiotic use within the last month with both doxycycline as well  as Augmentin History of C. difficile Allergy or intolerance to fluoroquinolones Persistent green nasal drainage No acute fevers but increased  fatigue and facial pain  Plan: Present to the emergency room for further evaluation Need to consider CT imaging of sinuses Keep scheduled follow-up with [facility] next week Need to keep routine follow-up with the ENT at least every 3 to 6 months given previous surgeries and recurrent sinus symptoms  Counseled patient on the need for her to consistently follow-up with ENT instead of recurrently seeking care at different urgent care facilities.    Patient is at high risk for recurrent C. difficile given her high antibiotic use.    Follow Up Instructions:  Return in about 6 weeks (around 06/20/2019), or if symptoms worsen or fail to improve, for Follow up with Dr. Lamonte Sakai, Follow up with Derl Barrow AGNP-BC, Follow up with Wyn Quaker FNP-C.    I discussed the assessment and treatment plan with the patient. The patient was provided an opportunity to ask questions and all were answered. The patient agreed with the plan and demonstrated an understanding of the instructions.   The patient was advised to call back or seek an in-person evaluation if the symptoms worsen or if the condition fails to improve as anticipated.  I provided 32 minutes of non-face-to-face time during this encounter.   Lauraine Rinne, NP

## 2019-05-09 NOTE — ED Triage Notes (Signed)
Pt reports head congestion, sore throat, weakness, SOB and CP for about 2 weeks. Pt reports possible covid exposure a week ago and tested negative

## 2019-05-09 NOTE — Patient Instructions (Addendum)
You were seen today by Lauraine Rinne, NP  for:   1. Acute recurrent frontal sinusitis  - CT Maxillofacial LTD WO CM; Future  You have recently completed doxycycline as well as Augmentin from urgent care visits.  You have not had any CT imaging of your sinuses during this time.  I would like to have a CT of your sinuses completed prior to your visit with ear nose and throat next week with Dr. Janace Hoard.  Keep your appointment with ear nose and throat next week  2. Shortness of breath  Due to the acute and worsening nature of your symptoms as well as the difficulty for [place] to further evaluate this as a virtual visit I think it is in your best interest to present to an emergency room for further evaluation of your symptoms have not improved despite multiple rounds of antibiotics as well as current steroid taper.  We will contact [facility] emergency room and let them know that you are on your way.   We recommend today:  Orders Placed This Encounter  Procedures  . CT Maxillofacial LTD WO CM    Standing Status:   Future    Standing Expiration Date:   08/08/2020    Order Specific Question:   Is patient pregnant?    Answer:   No    Order Specific Question:   Preferred imaging location?    Answer:   [place]    Order Specific Question:   Radiology Contrast Protocol - do NOT remove file path    Answer:   [email or web address]   Orders Placed This Encounter  Procedures  . CT Maxillofacial LTD WO CM   No orders of the defined types were placed in this encounter.   Follow Up:    Return in about 6 weeks (around 06/20/2019), or if symptoms worsen or fail to improve, for Follow up with Dr. Lamonte Sakai, Follow up with Derl Barrow AGNP-BC, Follow up with Wyn Quaker FNP-C.   Please do your part to reduce the spread of COVID-19:      Reduce your risk of any infection  and COVID19 by using the similar precautions used for avoiding the common cold or flu:  Marland Kitchen Wash  your hands often with soap and warm water for at least 20 seconds.  If soap and water are not readily available, use an alcohol-based hand sanitizer with at least 60% alcohol.  . If coughing or sneezing, cover your mouth and nose by coughing or sneezing into the elbow areas of your shirt or coat, into a tissue or into your sleeve (not your hands). Langley Gauss A MASK when in public  . Avoid shaking hands with others and consider head nods or verbal greetings only. . Avoid touching your eyes, nose, or mouth with unwashed hands.  . Avoid close contact with people who are sick. . Avoid places or events with large numbers of people in one location, like concerts or sporting events. . If you have some symptoms but not all symptoms, continue to monitor at home and seek medical attention if your symptoms worsen. . If you are having a medical emergency, call 911.   [place] / e-Visit: [email or web address]         [facility]: [facility]: [phone]                   [facility]:  [phone]     It is flu season:   >>> Best ways to protect herself from the flu: Receive the yearly flu vaccine, practice good hand hygiene washing with soap and also using hand sanitizer when available, eat a nutritious meals, get adequate rest, hydrate appropriately   Please contact the office if your symptoms worsen or you have concerns that you are not improving.   Thank you for choosing [facility] for your healthcare, and for allowing [place] to partner with you on your healthcare journey. I am thankful to be able to provide care to you today.   Wyn Quaker FNP-C

## 2019-05-09 NOTE — ED Notes (Signed)
Pt discharged from ED; instructions provided and scripts given; Pt encouraged to return to ED if symptoms worsen and to f/u with PCP; Pt verbalized understanding of all instructions 

## 2019-05-09 NOTE — Assessment & Plan Note (Signed)
Plan: Present to the emergency room for further evaluation Need to consider chest x-ray as well as CT sinus imaging Need to also consider baseline lab work Continue to take prednisone as prescribed Continue take Symbicort 160 as prescribed Need to have follow-up in office in 4 to 6 weeks

## 2019-05-09 NOTE — Assessment & Plan Note (Signed)
Plan: Continue Nasonex Continue Allegra Continue Singulair Increase nasal saline rinses to at least 2 times daily

## 2019-05-09 NOTE — ED Provider Notes (Signed)
[facility] EMERGENCY DEPARTMENT Provider Note   CSN: [id number] Arrival date & time: 05/09/19  1231     History   Chief Complaint Chief Complaint  Patient presents with  . Shortness of Breath    HPI Carolyn Castro is a 38 y.o. female.     HPI Pt has been having trouble over the last month.  She has been coughing, having nasal congestion, and having shortness of breath.  She saw her pulmonary doctor and was started on courses of prednisone without relief.  Pt was started on a course of keflex.  She saw the pulmonologist and was started on another course of steroids.  Pt is having sharp pain mostly in the back.  She also has nasal congestion and throat discomfort.   Pt was testsed for covid, last Friday.  It was negative.  Past Medical History:  Diagnosis Date  . Anxiety   . Asthma   . Back pain   . Hypertension   . Hypothyroid     Patient Active Problem List   Diagnosis Date Noted  . Shortness of breath 05/09/2019  . Acute recurrent frontal sinusitis 05/09/2019  . Allergic rhinitis 02/20/2019  . Normocytic anemia 01/02/2019  . Hyperglycemia 01/02/2019  . GERD (gastroesophageal reflux disease) 01/02/2019  . Acute paraplegia (HCC) 12/26/2018  . Lumbar stenosis with neurogenic claudication 12/26/2018  . Obesity, Class III, BMI 40-49.9 (morbid obesity) (HCC) 12/26/2018  . Essential hypertension 12/26/2018  . Near syncope 12/26/2018  . Lumbar stenosis 12/26/2018  . Asthma 12/25/2018  . Snoring 12/25/2018  . Low back pain 09/04/2018  . Hypothyroidism 09/04/2018  . Infected incision 07/04/2018  . Adnexal mass 06/29/2018  . Pelvic pain 06/29/2018    Past Surgical History:  Procedure Laterality Date  . BACK SURGERY    . COLONOSCOPY    . ESOPHAGOGASTRODUODENOSCOPY (EGD) WITH PROPOFOL N/A 01/28/2019   Procedure: ESOPHAGOGASTRODUODENOSCOPY (EGD) WITH PROPOFOL;  Surgeon: Carman Ching, MD;  Location: [facility];  Service: Endoscopy;  Laterality:  N/A;  . LAPAROSCOPIC OVARIAN CYSTECTOMY Left 06/29/2018   Procedure: LAPAROSCOPIC OVARIAN CYSTECTOMY;  Surgeon: Gerald Leitz, MD;  Location: [facility];  Service: Gynecology;  Laterality: Left;  . TONSILLECTOMY    . WISDOM TOOTH EXTRACTION       OB History    Gravida  0   Para  0   Term  0   Preterm  0   AB  0   Living  0     SAB  0   TAB  0   Ectopic  0   Multiple  0   Live Births  0            Home Medications    Prior to Admission medications   Medication Sig Start Date End Date Taking? Authorizing Provider  acetaminophen (TYLENOL) 500 MG tablet Take 1,000 mg by mouth every 6 (six) hours as needed (for pain).     [provider]  albuterol (VENTOLIN HFA) 108 (90 Base) MCG/ACT inhaler Inhale 2 puffs into the lungs every 6 (six) hours as needed for wheezing or shortness of breath. 03/31/19   Junie Spencer, FNP  ALPRAZolam Prudy Feeler) 0.5 MG tablet Take 1 tablet (0.5 mg total) by mouth 3 (three) times daily as needed for anxiety. Patient taking differently: Take 0.5 mg by mouth 2 (two) times daily as needed for anxiety.  09/27/18   Cottle, Steva Ready., MD  ALPRAZolam Prudy Feeler) 1 MG tablet TK 1/2 T  PO BID PRN 02/18/19   [provider]  amLODipine (NORVASC) 10 MG tablet Take 10 mg by mouth daily.    [provider]  budesonide-formoterol (SYMBICORT) 160-4.5 MCG/ACT inhaler Inhale 2 puffs into the lungs 2 (two) times daily. 02/20/19   Collene Gobble, MD  cholecalciferol (VITAMIN D3) 25 MCG (1000 UT) tablet Take 1,000 Units by mouth daily.    [provider]  CRANBERRY PO Take 1 tablet by mouth 2 (two) times daily.     [provider]  cyclobenzaprine (FLEXERIL) 10 MG tablet TK 1 T PO QD HS PRN 03/05/19   [provider]  diphenhydrAMINE (BENADRYL) 25 MG tablet Take 50 mg by mouth as needed for itching or allergies.     [provider]  Docusate Sodium (STOOL SOFTENER) 100 MG capsule Take 100 mg by  mouth 2 (two) times daily.    [provider]  doxycycline (VIBRA-TABS) 100 MG tablet Take 1 tablet (100 mg total) by mouth 2 (two) times daily. 05/09/19   Dorie Rank, MD  ferrous sulfate 325 (65 FE) MG tablet Take 1 tablet (325 mg total) by mouth 2 (two) times daily with a meal. 01/04/19   Hongalgi, Lenis Dickinson, MD  fexofenadine (ALLEGRA) 180 MG tablet Take 180 mg by mouth daily.    [provider]  furosemide (LASIX) 40 MG tablet Take 40 mg by mouth daily. 01/14/19   [provider]  gabapentin (NEURONTIN) 300 MG capsule Take 300 mg by mouth 3 (three) times daily as needed. 04/17/19   [provider]  ibuprofen (ADVIL,MOTRIN) 200 MG tablet Take 800 mg by mouth every 6 (six) hours as needed (for pain).     [provider]  ipratropium-albuterol (DUONEB) 0.5-2.5 (3) MG/3ML SOLN Take 3 mLs by nebulization every 6 (six) hours as needed. 04/26/19   [provider]  levothyroxine (SYNTHROID, LEVOTHROID) 75 MCG tablet Take 75 mcg by mouth daily before breakfast.    [provider]  meloxicam (MOBIC) 15 MG tablet Take 1 tablet (15 mg total) by mouth daily. 04/29/19   Edrick Kins, DPM  mometasone (NASONEX) 50 MCG/ACT nasal spray Place 2 sprays into the nose daily.    [provider]  montelukast (SINGULAIR) 10 MG tablet Take 10 mg by mouth every evening.     [provider]  norethindrone-ethinyl estradiol (MICROGESTIN,JUNEL,LOESTRIN) 1-20 MG-MCG tablet Take 1 tablet by mouth daily.    [provider]  omeprazole (PRILOSEC OTC) 20 MG tablet Take 20 mg by mouth 2 (two) times daily.     [provider]  omeprazole (PRILOSEC) 20 MG capsule TK 1 C PO BID 03/02/19   [provider]  ondansetron (ZOFRAN-ODT) 4 MG disintegrating tablet DIS 1 T ON THE TONGUE QD PRF NAUSEA 03/05/19   [provider]  oxyCODONE (OXY IR/ROXICODONE) 5 MG immediate release tablet Take 1 tablet (5 mg total) by mouth every 6 (six)  hours as needed for moderate pain or severe pain. 12/30/18   Dhungel, Nishant, MD  PARoxetine (PAXIL) 30 MG tablet Take 2 tablets (60 mg total) by mouth daily. 04/12/19   Cottle, Billey Co., MD  PAROXETINE HCL PO Take by mouth. unkown by patient re dos but not 60 mg daily    [provider]  predniSONE (DELTASONE) 10 MG tablet Take 4 tabs po daily x 3 days; then 3 tabs daily x3 days; then 2 tabs daily x3 days; then 1 tab daily x 3 days; then  stop 05/03/19   Glenford BayleyWalsh, Elizabeth W, NP  Probiotic Product (PROBIOTIC DAILY PO) Take 1 tablet by mouth daily.    [provider]  senna (SENOKOT) 8.6 MG TABS tablet Take 1 tablet (8.6 mg total) by mouth daily as needed for mild constipation or moderate constipation. 12/30/18   Dhungel, Theda BelfastNishant, MD  traZODone (DESYREL) 50 MG tablet TAKE 1 TO 2 TABLETS(50 TO 100 MG) BY MOUTH AT BEDTIME 02/17/19   Cottle, Steva Readyarey G Jr., MD  vitamin B-12 1000 MCG tablet Take 1 tablet (1,000 mcg total) by mouth daily. 01/05/19   Hongalgi, Maximino GreenlandAnand D, MD    Family History Family History  Problem Relation Age of Onset  . Hypertension Mother   . Hypertension Father     Social History Social History   Tobacco Use  . Smoking status: Never Smoker  . Smokeless tobacco: Never Used  Substance Use Topics  . Alcohol use: Never    Frequency: Never  . Drug use: Never     Allergies   Ciprofloxacin hcl   Review of Systems Review of Systems  Constitutional: Positive for fatigue.  Respiratory: Positive for shortness of breath.   Cardiovascular: Positive for chest pain. Negative for leg swelling.  All other systems reviewed and are negative.    Physical Exam Updated Vital Signs BP (!) 141/100   Pulse 100   Temp 98 F (36.7 C) (Oral)   Resp (!) 26   Ht 1.626 m (5\' 4" )   Wt 117.9 kg   SpO2 95%   BMI 44.63 kg/m   Physical Exam Vitals signs and nursing note reviewed.  Constitutional:      General: She is not in acute distress.    Appearance: She is  well-developed.  HENT:     Head: Normocephalic and atraumatic.     Right Ear: External ear normal.     Left Ear: External ear normal.  Eyes:     General: No scleral icterus.       Right eye: No discharge.        Left eye: No discharge.     Conjunctiva/sclera: Conjunctivae normal.  Neck:     Musculoskeletal: Neck supple.     Trachea: No tracheal deviation.  Cardiovascular:     Rate and Rhythm: Regular rhythm. Tachycardia present.  Pulmonary:     Effort: Pulmonary effort is normal. No respiratory distress.     Breath sounds: Normal breath sounds. No stridor. No decreased breath sounds, wheezing or rales.  Abdominal:     General: Bowel sounds are normal. There is no distension.     Palpations: Abdomen is soft.     Tenderness: There is no abdominal tenderness. There is no guarding or rebound.  Musculoskeletal:        General: No tenderness.  Skin:    General: Skin is warm and dry.     Findings: No rash.  Neurological:     Mental Status: She is alert.     Cranial Nerves: No cranial nerve deficit (no facial droop, extraocular movements intact, no slurred speech).     Sensory: No sensory deficit.     Motor: No abnormal muscle tone or seizure activity.     Coordination: Coordination normal.      ED Treatments / Results  Labs (all labs ordered are listed, but only abnormal results are displayed) Labs Reviewed  BASIC METABOLIC PANEL - Abnormal; Notable for the following components:      Result Value   Potassium 3.4 (*)  Creatinine, Ser 1.16 (*)    Calcium 8.7 (*)    All other components within normal limits  CBC - Abnormal; Notable for the following components:   WBC 12.5 (*)    Platelets 439 (*)    All other components within normal limits  I-STAT BETA HCG BLOOD, ED ([facility], [facility], [facility] ONLY)  TROPONIN I (HIGH SENSITIVITY)  TROPONIN I (HIGH SENSITIVITY)    EKG EKG Interpretation  Date/Time:  Thursday May 09 2019 12:37:56 EST Ventricular Rate:  127 PR Interval:  124  QRS Duration: 76 QT Interval:  306 QTC Calculation: 444 R Axis:   -11 Text Interpretation: Sinus tachycardia Possible Left atrial enlargement Possible Anterior infarct , age undetermined Abnormal ECG No significant change since last tracing Confirmed by Linwood Dibbles [phone]) on 05/09/2019 10:10:51 PM   Radiology Dg Chest 2 View  Result Date: 05/09/2019 CLINICAL DATA:  38 year old female with chest pain radiating to the upper back. Shortness of breath and productive cough. Negative for COVID-19 1 week ago. EXAM: CHEST - 2 VIEW COMPARISON:  Chest radiographs 04/04/2019 and earlier. FINDINGS: Low normal lung volumes. Mediastinal contours are stable and within normal limits. Visualized tracheal air column is within normal limits. Both lungs appear stable and clear. No acute osseous abnormality identified. Negative visible bowel gas pattern. IMPRESSION: Negative.  No acute cardiopulmonary abnormality. Electronically Signed   By: Odessa Fleming M.D.   On: 05/09/2019 13:19   Ct Angio Chest Pe W And/or Wo Contrast  Result Date: 05/09/2019 CLINICAL DATA:  PE suspected. Sore throat and weakness with shortness of breath and chest pain for 2 weeks. EXAM: CT ANGIOGRAPHY CHEST WITH CONTRAST TECHNIQUE: Multidetector CT imaging of the chest was performed using the standard protocol during bolus administration of intravenous contrast. Multiplanar CT image reconstructions and MIPs were obtained to evaluate the vascular anatomy. CONTRAST:  OMNIPAQUE IOHEXOL 350 MG/ML SOLN COMPARISON:  None. FINDINGS: Cardiovascular: Evaluation is limited by motion artifact and suboptimal contrast bolus timing.Given these limitations, no pulmonary embolism was identified. The main pulmonary artery is not dilated. The heart size is normal. There are no significant atherosclerotic changes of the thoracic aorta. Mediastinum/Nodes: --No mediastinal or hilar lymphadenopathy. --No axillary lymphadenopathy. --No supraclavicular lymphadenopathy.  --Normal thyroid gland. --The esophagus is unremarkable Lungs/Pleura: There are ground-glass airspace opacities at the lung bases bilaterally, left worse than right. There is no pneumothorax. The lung volumes are low. Atelectasis is noted in the lingula. Upper Abdomen: There is a nodular contour of the liver. Musculoskeletal: No chest wall abnormality. No acute or significant osseous findings. Review of the MIP images confirms the above findings. IMPRESSION: 1. Evaluation is limited by motion artifact and suboptimal contrast bolus timing. Given these limitations, no pulmonary embolism was identified. 2. Ground-glass airspace opacities at the lung bases bilaterally, left worse than right. This may represent atelectasis, aspiration or pneumonia. 3. Nodular contour of the liver, suggestive of cirrhosis. Aortic Atherosclerosis (ICD10-I70.0). Electronically Signed   By: Katherine Mantle M.D.   On: 05/09/2019 23:25    Procedures Procedures (including critical care time)  Medications Ordered in ED Medications  sodium chloride flush (NS) 0.9 % injection 3 mL (3 mLs Intravenous Not Given 05/09/19 2221)  doxycycline (VIBRA-TABS) tablet 100 mg (has no administration in time range)  iohexol (OMNIPAQUE) 350 MG/ML injection 100 mL (100 mLs Intravenous Contrast Given 05/09/19 2304)     Initial Impression / Assessment and Plan / ED Course  I have reviewed the triage vital signs and the nursing notes.  Pertinent labs & imaging results that were available during my care of the patient were reviewed by me and considered in my medical decision making (see chart for details).   Patient presented to the emergency room with complaints of dyspnea.  Patient has been on several courses of antibiotics as well as steroids.  CT scan was performed to rule out pulmonary embolism considering her persistent tachycardia.  CT scan did not demonstrate PE but did show findings suggest possible bilateral pneumonia.  Patient did have a  Covid test within the past week.  I will start her on doxycycline.  Discussed outpatient follow-up with her pulmonary doctor.  I also discussed the nodularity noted on her liver.  I recommend the patient have liver function panel drawn at her primary care doctor's office.  Final Clinical Impressions(s) / ED Diagnoses   Final diagnoses:  Dyspnea, unspecified type    ED Discharge Orders         Ordered    doxycycline (VIBRA-TABS) 100 MG tablet  2 times daily     05/09/19 2337           Linwood Dibbles, MD 05/09/19 2338

## 2019-05-09 NOTE — Assessment & Plan Note (Addendum)
Persistent recurrent symptoms Recent antibiotic use within the last month with both doxycycline as well as Augmentin History of C. difficile Allergy or intolerance to fluoroquinolones Persistent green nasal drainage No acute fevers but increased fatigue and facial pain  Plan: Present to the emergency room for further evaluation Need to consider CT imaging of sinuses Keep scheduled follow-up with [facility]NT next week Need to keep routine follow-up with the ENT at least every 3 to 6 months given previous surgeries and recurrent sinus symptoms  Counseled patient on the need for her to consistently follow-up with ENT instead of recurrently seeking care at different urgent care facilities.    Patient is at high risk for recurrent C. difficile given her high antibiotic use.

## 2019-05-10 ENCOUNTER — Telehealth: Payer: Self-pay | Admitting: Primary Care

## 2019-05-10 NOTE — Telephone Encounter (Signed)
lmtcb for pt. When pt returns call we need to reschedule televisit to this afternoon- pt should not wait through the weekend.  [facility] and [facility] both have several openings for appts today.

## 2019-05-11 ENCOUNTER — Emergency Department (HOSPITAL_COMMUNITY): Payer: 59

## 2019-05-11 ENCOUNTER — Emergency Department (HOSPITAL_COMMUNITY)
Admission: EM | Admit: 2019-05-11 | Discharge: 2019-05-11 | Disposition: A | Discharge status: Home / Self Care | Payer: 59 | Attending: Emergency Medicine | Admitting: Emergency Medicine

## 2019-05-11 ENCOUNTER — Other Ambulatory Visit: Payer: Self-pay

## 2019-05-11 ENCOUNTER — Encounter (HOSPITAL_COMMUNITY): Payer: Self-pay | Admitting: Emergency Medicine

## 2019-05-11 DIAGNOSIS — R0789 Other chest pain: Secondary | ICD-10-CM | POA: Diagnosis not present

## 2019-05-11 DIAGNOSIS — R05 Cough: Secondary | ICD-10-CM | POA: Insufficient documentation

## 2019-05-11 DIAGNOSIS — R110 Nausea: Secondary | ICD-10-CM | POA: Insufficient documentation

## 2019-05-11 DIAGNOSIS — Z79899 Other long term (current) drug therapy: Secondary | ICD-10-CM | POA: Insufficient documentation

## 2019-05-11 DIAGNOSIS — Z20828 Contact with and (suspected) exposure to other viral communicable diseases: Secondary | ICD-10-CM | POA: Insufficient documentation

## 2019-05-11 DIAGNOSIS — Z20822 Contact with and (suspected) exposure to covid-19: Secondary | ICD-10-CM

## 2019-05-11 DIAGNOSIS — E039 Hypothyroidism, unspecified: Secondary | ICD-10-CM | POA: Diagnosis not present

## 2019-05-11 DIAGNOSIS — I10 Essential (primary) hypertension: Secondary | ICD-10-CM | POA: Diagnosis not present

## 2019-05-11 DIAGNOSIS — Z793 Long term (current) use of hormonal contraceptives: Secondary | ICD-10-CM | POA: Diagnosis not present

## 2019-05-11 DIAGNOSIS — R0602 Shortness of breath: Secondary | ICD-10-CM | POA: Diagnosis present

## 2019-05-11 LAB — BASIC METABOLIC PANEL
Anion gap: 12 (ref 5–15)
BUN: 13 mg/dL (ref 6–20)
CO2: 20 mmol/L — ABNORMAL LOW (ref 22–32)
Calcium: 8.8 mg/dL — ABNORMAL LOW (ref 8.9–10.3)
Chloride: 102 mmol/L (ref 98–111)
Creatinine, Ser: 0.97 mg/dL (ref 0.44–1.00)
GFR calc Af Amer: >60 mL/min (ref >60)
GFR calc non Af Amer: >60 mL/min (ref >60)
Glucose, Bld: 98 mg/dL (ref 70–99)
Potassium: 3.5 mmol/L (ref 3.5–5.1)
Sodium: 134 mmol/L — ABNORMAL LOW (ref 135–145)

## 2019-05-11 LAB — CBC
HCT: 42.6 % (ref 36.0–46.0)
Hemoglobin: 13.8 g/dL (ref 12.0–15.0)
MCH: 28.9 pg (ref 26.0–34.0)
MCHC: 32.4 g/dL (ref 30.0–36.0)
MCV: 89.3 fL (ref 80.0–100.0)
Platelets: 403 10*3/uL — ABNORMAL HIGH (ref 150–400)
RBC: 4.77 MIL/uL (ref 3.87–5.11)
RDW: 14.2 % (ref 11.5–15.5)
WBC: 8.9 10*3/uL (ref 4.0–10.5)
nRBC: 0 % (ref 0.0–0.2)

## 2019-05-11 LAB — TROPONIN I (HIGH SENSITIVITY): Troponin I (High Sensitivity): 3 ng/L (ref <18)

## 2019-05-11 LAB — I-STAT BETA HCG BLOOD, ED (MC, WL, AP ONLY): I-stat hCG, quantitative: <5 m[IU]/mL (ref <5)

## 2019-05-11 MED ORDER — SODIUM CHLORIDE 0.9% FLUSH: 3.0000 mL | Freq: Once | INTRAVENOUS | Status: DC

## 2019-05-11 MED ORDER — LACTATED RINGERS IV BOLUS
1000.0000 mL | Freq: Once | INTRAVENOUS | Status: AC
  Administered 2019-05-11: 1000 mL via INTRAVENOUS

## 2019-05-11 MED ORDER — PROCHLORPERAZINE EDISYLATE 10 MG/2ML IJ SOLN
10.0000 mg | Freq: Once | INTRAMUSCULAR | Status: AC
  Administered 2019-05-11: 10 mg via INTRAVENOUS
  Filled 2019-05-11: qty 2

## 2019-05-11 MED ORDER — DIPHENHYDRAMINE HCL 50 MG/ML IJ SOLN
25.0000 mg | Freq: Once | INTRAMUSCULAR | Status: AC
  Administered 2019-05-11: 25 mg via INTRAVENOUS
  Filled 2019-05-11: qty 1

## 2019-05-11 MED ORDER — PROCHLORPERAZINE MALEATE 10 MG PO TABS: 10.0000 mg | ORAL_TABLET | Freq: Two times a day (BID) | ORAL | 0 refills | Status: DC | PRN

## 2019-05-11 MED ORDER — ACETAMINOPHEN 500 MG PO TABS
1000.0000 mg | ORAL_TABLET | Freq: Once | ORAL | Status: AC
  Administered 2019-05-11: 1000 mg via ORAL
  Filled 2019-05-11: qty 2

## 2019-05-11 NOTE — ED Triage Notes (Signed)
Pt to triage via [facility]> + COVID exposure at work.  Tested on Friday and negative results.  C/o body aches, non-productive cough, pain across chest, and SOB x 1 week.

## 2019-05-11 NOTE — ED Notes (Signed)
Patient verbalizes understanding of discharge instructions. Opportunity for questioning and answers were provided. Armband removed by staff, pt discharged from ED. Pt. ambulatory and discharged home.  

## 2019-05-11 NOTE — ED Provider Notes (Signed)
[facility] Provider Note   CSN: [id number] Arrival date & time: 05/11/19  1808     History   Chief Complaint Chief Complaint  Patient presents with   Chest Pain   Cough   Shortness of Breath    HPI Carolyn Castro is a 38 y.o. female.      Chest Pain Pain location:  Substernal area Pain quality: sharp and stabbing   Pain radiates to:  Does not radiate Pain severity:  Moderate Onset quality:  Gradual Duration:  10 days Timing:  Constant Progression:  Waxing and waning Chronicity:  New Context comment:  Multiple sick contacts with COVID. Was just seen here for SOB and had CTA PE which found PNA but no PE. Patient was started on doxycycline as an o/p without improvement Relieved by:  Rest Worsened by:  Exertion Ineffective treatments:  None tried Associated symptoms: cough, fatigue, nausea and shortness of breath   Associated symptoms: no abdominal pain, no altered mental status, no back pain, no fever, no lower extremity edema, no palpitations and no vomiting   Risk factors: obesity   Risk factors: no coronary artery disease   Cough Associated symptoms: chest pain, myalgias, shortness of breath and sore throat   Associated symptoms: no chills, no ear pain, no fever and no rash   Shortness of Breath Associated symptoms: chest pain, cough and sore throat   Associated symptoms: no abdominal pain, no ear pain, no fever, no rash and no vomiting     Past Medical History:  Diagnosis Date   Anxiety    Asthma    Back pain    Hypertension    Hypothyroid     Patient Active Problem List   Diagnosis Date Noted   Shortness of breath 05/09/2019   Acute recurrent frontal sinusitis 05/09/2019   Allergic rhinitis 02/20/2019   Normocytic anemia 01/02/2019   Hyperglycemia 01/02/2019   GERD (gastroesophageal reflux disease) 01/02/2019   Acute paraplegia (HCC) 12/26/2018   Lumbar stenosis with neurogenic claudication  12/26/2018   Obesity, Class III, BMI 40-49.9 (morbid obesity) (HCC) 12/26/2018   Essential hypertension 12/26/2018   Near syncope 12/26/2018   Lumbar stenosis 12/26/2018   Asthma 12/25/2018   Snoring 12/25/2018   Low back pain 09/04/2018   Hypothyroidism 09/04/2018   Infected incision 07/04/2018   Adnexal mass 06/29/2018   Pelvic pain 06/29/2018    Past Surgical History:  Procedure Laterality Date   BACK SURGERY     COLONOSCOPY     ESOPHAGOGASTRODUODENOSCOPY (EGD) WITH PROPOFOL N/A 01/28/2019   Procedure: ESOPHAGOGASTRODUODENOSCOPY (EGD) WITH PROPOFOL;  Surgeon: Carman ChingEdwards, James, MD;  Location: [facility];  Service: Endoscopy;  Laterality: N/A;   LAPAROSCOPIC OVARIAN CYSTECTOMY Left 06/29/2018   Procedure: LAPAROSCOPIC OVARIAN CYSTECTOMY;  Surgeon: Gerald Leitzole, Tara, MD;  Location: [facility];  Service: Gynecology;  Laterality: Left;   TONSILLECTOMY     WISDOM TOOTH EXTRACTION       OB History    Gravida  0   Para  0   Term  0   Preterm  0   AB  0   Living  0     SAB  0   TAB  0   Ectopic  0   Multiple  0   Live Births  0            Home Medications    Prior to Admission medications   Medication Sig Start Date End Date Taking? Authorizing Provider  acetaminophen (TYLENOL) 500 MG tablet Take 1,000 mg by mouth every 6 (six) hours as needed (for pain).     [provider]  albuterol (VENTOLIN HFA) 108 (90 Base) MCG/ACT inhaler Inhale 2 puffs into the lungs every 6 (six) hours as needed for wheezing or shortness of breath. 03/31/19   Sharion Balloon, FNP  ALPRAZolam Duanne Moron) 0.5 MG tablet Take 1 tablet (0.5 mg total) by mouth 3 (three) times daily as needed for anxiety. Patient taking differently: Take 0.5 mg by mouth 2 (two) times daily as needed for anxiety.  09/27/18   Cottle, Billey Co., MD  ALPRAZolam Duanne Moron) 1 MG tablet TK 1/2 T PO BID PRN 02/18/19   [provider]  amLODipine (NORVASC) 10 MG tablet Take 10 mg  by mouth daily.    [provider]  budesonide-formoterol (SYMBICORT) 160-4.5 MCG/ACT inhaler Inhale 2 puffs into the lungs 2 (two) times daily. 02/20/19   Collene Gobble, MD  cholecalciferol (VITAMIN D3) 25 MCG (1000 UT) tablet Take 1,000 Units by mouth daily.    [provider]  CRANBERRY PO Take 1 tablet by mouth 2 (two) times daily.     [provider]  cyclobenzaprine (FLEXERIL) 10 MG tablet TK 1 T PO QD HS PRN 03/05/19   [provider]  diphenhydrAMINE (BENADRYL) 25 MG tablet Take 50 mg by mouth as needed for itching or allergies.     [provider]  Docusate Sodium (STOOL SOFTENER) 100 MG capsule Take 100 mg by mouth 2 (two) times daily.    [provider]  doxycycline (VIBRA-TABS) 100 MG tablet Take 1 tablet (100 mg total) by mouth 2 (two) times daily. 05/09/19   Dorie Rank, MD  ferrous sulfate 325 (65 FE) MG tablet Take 1 tablet (325 mg total) by mouth 2 (two) times daily with a meal. 01/04/19   Hongalgi, Lenis Dickinson, MD  fexofenadine (ALLEGRA) 180 MG tablet Take 180 mg by mouth daily.    [provider]  furosemide (LASIX) 40 MG tablet Take 40 mg by mouth daily. 01/14/19   [provider]  gabapentin (NEURONTIN) 300 MG capsule Take 300 mg by mouth 3 (three) times daily as needed. 04/17/19   [provider]  ibuprofen (ADVIL,MOTRIN) 200 MG tablet Take 800 mg by mouth every 6 (six) hours as needed (for pain).     [provider]  ipratropium-albuterol (DUONEB) 0.5-2.5 (3) MG/3ML SOLN Take 3 mLs by nebulization every 6 (six) hours as needed. 04/26/19   [provider]  levothyroxine (SYNTHROID, LEVOTHROID) 75 MCG tablet Take 75 mcg by mouth daily before breakfast.    [provider]  meloxicam (MOBIC) 15 MG tablet Take 1 tablet (15 mg total) by mouth daily. 04/29/19   Edrick Kins, DPM  mometasone (NASONEX) 50 MCG/ACT nasal spray Place 2 sprays into the nose daily.    [provider]    montelukast (SINGULAIR) 10 MG tablet Take 10 mg by mouth every evening.     [provider]  norethindrone-ethinyl estradiol (MICROGESTIN,JUNEL,LOESTRIN) 1-20 MG-MCG tablet Take 1 tablet by mouth daily.    [provider]  omeprazole (PRILOSEC OTC) 20 MG tablet Take 20 mg by mouth 2 (two) times daily.     [provider]  omeprazole (PRILOSEC) 20 MG capsule TK 1 C PO BID 03/02/19   [provider]  ondansetron (ZOFRAN-ODT) 4 MG disintegrating tablet DIS 1 T ON THE TONGUE QD PRF NAUSEA 03/05/19   [provider]  oxyCODONE (OXY IR/ROXICODONE) 5 MG immediate release tablet Take 1 tablet (5 mg total) by mouth every 6 (six) hours as needed for moderate pain or severe pain. 12/30/18   Dhungel, Nishant, MD  PARoxetine (PAXIL) 30 MG tablet Take 2 tablets (60 mg total) by mouth daily. 04/12/19   Cottle, Steva Ready., MD  PAROXETINE HCL PO Take by mouth. unkown by patient re dos but not 60 mg daily    [provider]  predniSONE (DELTASONE) 10 MG tablet Take 4 tabs po daily x 3 days; then 3 tabs daily x3 days; then 2 tabs daily x3 days; then 1 tab daily x 3 days; then stop 05/03/19   Glenford Bayley, NP  Probiotic Product (PROBIOTIC DAILY PO) Take 1 tablet by mouth daily.    [provider]  prochlorperazine (COMPAZINE) 10 MG tablet Take 1 tablet (10 mg total) by mouth 2 (two) times daily as needed for nausea or vomiting. 05/11/19   Jonna Clark, MD  senna (SENOKOT) 8.6 MG TABS tablet Take 1 tablet (8.6 mg total) by mouth daily as needed for mild constipation or moderate constipation. 12/30/18   Dhungel, Theda Belfast, MD  traZODone (DESYREL) 50 MG tablet TAKE 1 TO 2 TABLETS(50 TO 100 MG) BY MOUTH AT BEDTIME 02/17/19   Cottle, Steva Ready., MD  vitamin B-12 1000 MCG tablet Take 1 tablet (1,000 mcg total) by mouth daily. 01/05/19   Hongalgi, Maximino [place], MD    Family History Family History  Problem Relation Age of Onset   Hypertension Mother     Hypertension Father     Social History Social History   Tobacco Use   Smoking status: Never Smoker   Smokeless tobacco: Never Used  Substance Use Topics   Alcohol use: Never    Frequency: Never   Drug use: Never     Allergies   Ciprofloxacin hcl   Review of Systems Review of Systems  Constitutional: Positive for fatigue. Negative for chills and fever.  HENT: Positive for sore throat. Negative for ear pain.   Eyes: Negative for pain and visual disturbance.  Respiratory: Positive for cough, chest tightness and shortness of breath.   Cardiovascular: Positive for chest pain. Negative for palpitations and leg swelling.  Gastrointestinal: Positive for nausea. Negative for abdominal pain and vomiting.  Genitourinary: Negative for dysuria and hematuria.  Musculoskeletal: Positive for arthralgias and myalgias. Negative for back pain.  Skin: Negative for color change and rash.  Neurological: Negative for seizures and syncope.  All other systems reviewed and are negative.    Physical Exam Updated Vital Signs BP 115/81    Pulse 91    Temp 98.3 F (36.8 C) (Oral)    Resp 20    SpO2 95%   Physical Exam Vitals signs and nursing note reviewed.  Constitutional:      General: She is not in acute distress.    Appearance: She is well-developed. She is obese. She is ill-appearing.  HENT:     Head: Normocephalic and atraumatic.  Eyes:     Conjunctiva/sclera: Conjunctivae normal.  Neck:     Musculoskeletal: Normal range of motion and neck supple.  Cardiovascular:     Rate and Rhythm: Normal rate and regular rhythm.     Heart sounds: No murmur.  Pulmonary:     Effort: Pulmonary effort is normal. No respiratory distress.     Breath sounds: Normal breath sounds. No decreased breath sounds.  Abdominal:     Palpations: Abdomen is  soft.     Tenderness: There is no abdominal tenderness.  Musculoskeletal:     Right lower leg: She exhibits no tenderness. No edema.     Left lower  leg: She exhibits no tenderness. No edema.  Lymphadenopathy:     Cervical: No cervical adenopathy.  Skin:    General: Skin is warm and dry.     Capillary Refill: Capillary refill takes less than 2 seconds.  Neurological:     General: No focal deficit present.     Mental Status: She is alert.      ED Treatments / Results  Labs (all labs ordered are listed, but only abnormal results are displayed) Labs Reviewed  BASIC METABOLIC PANEL - Abnormal; Notable for the following components:      Result Value   Sodium 134 (*)    CO2 20 (*)    Calcium 8.8 (*)    All other components within normal limits  CBC - Abnormal; Notable for the following components:   Platelets 403 (*)    All other components within normal limits  SARS CORONAVIRUS 2 (TAT 6-24 HRS)  I-STAT BETA HCG BLOOD, ED ([facility], [facility], [facility] ONLY)  TROPONIN I (HIGH SENSITIVITY)    EKG EKG Interpretation  Date/Time:  Saturday May 11 2019 18:21:57 EST Ventricular Rate:  105 PR Interval:  130 QRS Duration: 78 QT Interval:  342 QTC Calculation: 452 R Axis:   2 Text Interpretation: Sinus tachycardia Otherwise normal ECG No significant change since last tracing Confirmed by Jacalyn Lefevre [phone]) on 05/11/2019 7:27:59 PM   Radiology Dg Chest Portable 1 View  Result Date: 05/11/2019 CLINICAL DATA:  COVID symptoms EXAM: PORTABLE CHEST 1 VIEW COMPARISON:  Chest radiograph 05/09/2019 FINDINGS: The heart size and mediastinal contours are within normal limits. The lungs are clear. No pneumothorax or large pleural effusion. The visualized skeletal structures are unremarkable. IMPRESSION: No evidence of active disease in the chest. Electronically Signed   By: Emmaline Kluver M.D.   On: 05/11/2019 18:51    Procedures Procedures (including critical care time)  Medications Ordered in ED Medications  sodium chloride flush (NS) 0.9 % injection 3 mL (has no administration in time range)  prochlorperazine (COMPAZINE) injection 10 mg  (10 mg Intravenous Given 05/11/19 2022)  diphenhydrAMINE (BENADRYL) injection 25 mg (25 mg Intravenous Given 05/11/19 2022)  lactated ringers bolus 1,000 mL (0 mLs Intravenous Stopped 05/11/19 2145)  acetaminophen (TYLENOL) tablet 1,000 mg (1,000 mg Oral Given 05/11/19 2021)     Initial Impression / Assessment and Plan / ED Course  I have reviewed the triage vital signs and the nursing notes.  Pertinent labs & imaging results that were available during my care of the patient were reviewed by me and considered in my medical decision making (see chart for details).        Patient is a 38 year old female with history and physical exam as above presents emergency department for evaluation of cough, chest pressure predominantly related to the cough, myalgias, and headache as well as decreased p.o. intake.  She has had a Covid positive exposure at work and has been having symptoms of shortness of breath for a week and a half.  Recently seen in the emergency department on Thursday for similar symptoms however her myalgias and cough and chest discomfort have been worsening.  Her chest pain is constant therefore I feel that ACS can be ruled out with 1 troponin as it has been present for at least 3 days and has been  constantly there.  Low suspicion for ACS at this time.  Patient is complaining of significant nausea currently as well as severe headache.  She was given an IV fluid bolus due to her decreased p.o. intake as well as Compazine, Benadryl, and Tylenol for combined nausea and headache relief.  Her symptoms drastically improved with these interventions.  Patient is tachycardic in the emergency department however she just had a CTA of her chest for PE on Thursday which was negative and did demonstrate findings concerning for bilateral pneumonia for which she was started on doxycycline without improvement.  At this point I have high suspicion for COVID-19 pneumonia.  No findings concerning for DVT, aside from  possible COVID-19 no other historical risk factors for DVT or PE. I do not feel the patient needs additional work-up for PE at this time. Patient was discharged with prescription for compazine for headache and nausea. Recommended she keep her previously scheduled appointments and encouraged virtual visits if possible due to high suspicion for COVID.  Care of patient was overseen by my attending physician.  Final Clinical Impressions(s) / ED Diagnoses   Final diagnoses:  Other chest pain  Suspected COVID-19 virus infection    ED Discharge Orders         Ordered    prochlorperazine (COMPAZINE) 10 MG tablet  2 times daily PRN     05/11/19 2253           Jonna Clark, MD 05/12/19 0101    Jacalyn Lefevre, MD 05/12/19 [phone]

## 2019-05-12 LAB — SARS CORONAVIRUS 2 (TAT 6-24 HRS): SARS Coronavirus 2: NEGATIVE

## 2019-05-13 ENCOUNTER — Ambulatory Visit (INDEPENDENT_AMBULATORY_CARE_PROVIDER_SITE_OTHER): Payer: 59 | Admitting: Primary Care

## 2019-05-13 DIAGNOSIS — J189 Pneumonia, unspecified organism: Secondary | ICD-10-CM | POA: Diagnosis not present

## 2019-05-13 NOTE — Progress Notes (Signed)
Virtual Visit via Telephone Note  I connected with Carolyn Castro on 05/13/19 at  9:30 AM EST by telephone and verified that I am speaking with the correct person using two identifiers.  Location: Patient: Home Provider: Office   I discussed the limitations, risks, security and privacy concerns of performing an evaluation and management service by telephone and the availability of in person appointments. I also discussed with the patient that there may be a patient responsible charge related to this service. The patient expressed understanding and agreed to proceed.   History of Present Illness: 38 year old female, never smoked. PMH significant for moderate persistent asthma. Patient of Dr. Lamonte Sakai, last seen on 02/20/19. Maintained on Symbicort 160.   Previous [facility] pulmonary encounter: 04/04/2019 Patient contacted today for acute symptoms of sinus pressure/congestion and cough. Associated shortness of breath, chest tightness, fatigue and low-grade temperature. Reports symptoms started on 03/25/19, she saw an UC doctor and was given prescription for Augmentin on Sunday 03/31/19. She reports no improvement in 4 days since being on antibiotic. Continues Symbicort 160 twice daily as prescribed, she has been using her rescue inhaler 3-4 times a day with improvement. She is using Nasacort daily.   04/10/2019 Patient contacted today for video visit. She is feeling slightly better but is still having some sinus pressure and yellow/white drainage. Reports associated chest tightness and wheezing. Taking Symbicort 160 as prescribed, She has had to use her rescue inhaler twice a day. Compliant with mucinex and nasal rinses. CXR was normal. Covid negative.  Additional prednisone.  05/03/2019 Patietn contacted today for video visit, she continues to have persistent sinusitis symptoms. She has been treated with Augmentin earlier this month with no improvement. She went to an urgent care in [place] last week  and was given doxycycline which she completed on Wednesday 10/28.  She reports severe sinus pressure and headache.  She has nasal and ear drainage. States that she gets up white to green sputum.  Associated chest tightness and congestion.  She is taking decongestants and Tylenol which helped for a couple of hours.  She had a chest x-ray earlier this month that appeared normal.  She does have an ear nose and throat doctor in [place]. Of note she was exposed to Covid 4 days ago on 10/26 at work.  She had Covid testing this morning which is pending.  05/13/2019 Patient contacted today for follow-up ED virtual visit. She has been seen twice in the Emergency Rooms since 11/5. CTA negative for PE, however, concerning for bilateral pneumonia. She was started on Doxycyline. She tested negative for COVID. 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 Four co-workers have tested positive for COVID-19. Work has shut down for two weeks. She is feeling ok. Using Symbicort twice daily with nebulizer every 4-6 hours. She has a few days left of Doxycycline. She has been checking her O2 level at home and reports 95-97% RA. Afebrile.  Observations/Objective:  - No shortness of breath, wheezing or cough noted during phone conversation  Assessment and Plan:  Bilateral pneumonia: - Suspected COVID pneumonia, clinically stable - O2 95-97% RA, afebrile  - Continues course of Doxycycline  - Recommend deep breathing exercises, oral hydration, prn tylenol and compazine for nasuea - FU televisit in 4 days to monitor symptoms   Asthma: - Continues Symbicort 160 twice daily  - Recommend scheduled bronchodilators q4-6 hours  Sinusitis - Reschedule sinus CT for 3-4 weeks   Follow Up Instructions:  -  Televisit Friday 9:30am with Waynetta Sandy NP    I discussed the assessment and treatment plan with the patient. The patient was provided an opportunity to ask questions and all were answered. The patient  agreed with the plan and demonstrated an understanding of the instructions.   The patient was advised to call back or seek an in-person evaluation if the symptoms worsen or if the condition fails to improve as anticipated.  I provided 20 minutes of non-face-to-face time during this encounter.   Glenford Bayley, NP

## 2019-05-13 NOTE — Patient Instructions (Addendum)
I'm sorry you have not been feeling well Carolyn Castro  Recommendations: Complete doxycycline as prescribed Continue Symbicort twice daily Use nebulizer every 4-6 hours scheduled Tylenol 650mg  every 4-6 hours for fever Monitor O2 level daily, notify if <90% Stay well hydrated- aim for eight to ten 8oz glasses of fluid day Light activity, practice deep breathing exercises  Continue quarantine   Follow-up: Friday telelvisit at 9:30am with Wednesday, NP Call sooner if needed, if symptoms worsen return to ED    Coronavirus (COVID-19) Are you at risk?  Are you at risk for the Coronavirus (COVID-19)?  To be considered HIGH RISK for Coronavirus (COVID-19), you have to meet the following criteria:  . Traveled to [place], [place], [place], [place] or [place]; or in the [place] to [place], [place], [place], or [place]; and have fever, cough, and shortness of breath within the last 2 weeks of travel OR . Been in close contact with a person diagnosed with COVID-19 within the last 2 weeks and have fever, cough, and shortness of breath . IF YOU DO NOT MEET THESE CRITERIA, YOU ARE CONSIDERED LOW RISK FOR COVID-19.  What to do if you are HIGH RISK for COVID-19?  [place] If you are having a medical emergency, call 911. . Seek medical care right away. Before you go to a doctor's office, urgent care or emergency department, call ahead and tell them about your recent travel, contact with someone diagnosed with COVID-19, and your symptoms. You should receive instructions from your physician's office regarding next steps of care.  . When you arrive at healthcare provider, tell the healthcare staff immediately you have returned from visiting [place], [place], [place], [place] or [place]; or traveled in the [place] to [place], [place], [place], or [place]; in the last two weeks or you have been in close contact with a person diagnosed with COVID-19 in the last 2 weeks.   . Tell the health care staff  about your symptoms: fever, cough and shortness of breath. . After you have been seen by a medical provider, you will be either: o Tested for (COVID-19) and discharged home on quarantine except to seek medical care if symptoms worsen, and asked to  - Stay home and avoid contact with others until you get your results (4-5 days)  - Avoid travel on public transportation if possible (such as bus, train, or airplane) or o Sent to the Emergency Department by EMS for evaluation, COVID-19 testing, and possible admission depending on your condition and test results.  What to do if you are LOW RISK for COVID-19?  Reduce your risk of any infection by using the same precautions used for avoiding the common cold or flu:  [place] Wash your hands often with soap and warm water for at least 20 seconds.  If soap and water are not readily available, use an alcohol-based hand sanitizer with at least 60% alcohol.  . If coughing or sneezing, cover your mouth and nose by coughing or sneezing into the elbow areas of your shirt or coat, into a tissue or into your sleeve (not your hands). . Avoid shaking hands with others and consider head nods or verbal greetings only. . Avoid touching your eyes, nose, or mouth with unwashed hands.  . Avoid close contact with people who are sick. . Avoid places or events with large numbers of people in one location, like concerts or sporting events. . Carefully consider travel plans you have or are making. . If  you are planning any travel outside or inside the [place], visit the CDC's Travelers' Health webpage for the latest health notices. . If you have some symptoms but not all symptoms, continue to monitor at home and seek medical attention if your symptoms worsen. . If you are having a medical emergency, call 911.   [place] / e-Visit: [email or web address]         [facility]:  [facility]: [phone]                   [facility]: [phone]

## 2019-05-13 NOTE — Telephone Encounter (Signed)
Pt is scheduled for an appointment with Beth this morning at 0930.

## 2019-05-15 ENCOUNTER — Other Ambulatory Visit: Payer: Self-pay | Admitting: Psychiatry

## 2019-05-16 ENCOUNTER — Inpatient Hospital Stay: Admission: RE | Admit: 2019-05-16 | Payer: 59 | Source: Ambulatory Visit

## 2019-05-16 NOTE — Progress Notes (Signed)
Virtual Visit via Telephone Note  I connected with LENITA PEREGRINA on 05/16/19 at  9:30 AM EST by telephone and verified that I am speaking with the correct person using two identifiers.  Location: Patient: Home Provider: Office   I discussed the limitations, risks, security and privacy concerns of performing an evaluation and management service by telephone and the availability of in person appointments. I also discussed with the patient that there may be a patient responsible charge related to this service. The patient expressed understanding and agreed to proceed.   History of Present Illness: 38 year old female, never smoked. PMH significant for moderate persistent asthma. Patient of Dr. Lamonte Sakai, last seen on 02/20/19. Maintained on Symbicort 160.   Previous [facility] pulmonary encounter: 04/04/2019 Patient contacted today for acute symptoms of sinus pressure/congestion and cough. Associated shortness of breath, chest tightness, fatigue and low-grade temperature. Reports symptoms started on 03/25/19, she saw an UC doctor and was given prescription for Augmentin on Sunday 03/31/19. She reports no improvement in 4 days since being on antibiotic. Continues Symbicort 160 twice daily as prescribed, she has been using her rescue inhaler 3-4 times a day with improvement. She is using Nasacort daily.   04/10/2019 Patient contacted today for video visit. She is feeling slightly better but is still having some sinus pressure and yellow/white drainage. Reports associated chest tightness and wheezing. Taking Symbicort 160 as prescribed, She has had to use her rescue inhaler twice a day. Compliant with mucinex and nasal rinses. CXR was normal. Covid negative.  Additional prednisone.  05/03/2019 Patietn contacted today for video visit, she continues to have persistent sinusitis symptoms. She has been treated with Augmentin earlier this month with no improvement. She went to an urgent care in [place] last week  and was given doxycycline which she completed on Wednesday 10/28.  She reports severe sinus pressure and headache.  She has nasal and ear drainage. States that she gets up white to green sputum.  Associated chest tightness and congestion.  She is taking decongestants and Tylenol which helped for a couple of hours.  She had a chest x-ray earlier this month that appeared normal.  She does have an ear nose and throat doctor in [place]. Of note she was exposed to Covid 4 days ago on 10/26 at work.  She had Covid testing this morning which is pending.  05/13/2019 Patient contacted today for follow-up ED virtual visit. She has been seen twice in the Emergency Rooms since 11/5. CTA negative for PE, however, concerning for bilateral pneumonia. She was started on Doxycyline. She tested negative for COVID. 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 Four co-workers have tested positive for COVID-19. Work has shut down for two weeks. She is feeling ok. Using Symbicort twice daily with nebulizer every 4-6 hours. She has a few days left of Doxycycline. She has been checking her O2 level at home and reports 95-97% RA. Afebrile.  05/17/2019 Patient contacted today for 4-5 day follow-up/ COVID pneumonia. She is still feeling about the same. Finishes doxycycline on Sunday. Cough is congested. She is taking 1,200mg  mucinex twice daily. Continues using Symbicort twice daily, she is using her nebulizer scheduled every 6 hours which she states has helped. Reports her O2 level has been between 94-96% RA.    Observations/Objective:  - No observed shortness of breath, wheezing or cough - Able to speak in full sentences  Assessment and Plan:  Bilateral pneumonia - Suspected COVID d/t known  exposure  - Completes Doxycycline on 11/14 - Continue Mucinex 1,200mg  twice daily - Monitor O2 levels and if sustaining <88-90% let office know/go to ED for eval   - Encourage rest, oral hydration and tylenol  prn fever/chills   Asthma: - Continue Symbicort 160 two puffs twice daily (add spacer) - Continue Albtuerol nebulizer scheduled q6 hours until feeling better  - Will send in prednisone taper to have on hand only if asthma symptoms worsen   Follow Up Instructions:  - Return/call if symptoms are not improving in 2 weeks or worsen in any way  - Will check on patient next week by phone call    I discussed the assessment and treatment plan with the patient. The patient was provided an opportunity to ask questions and all were answered. The patient agreed with the plan and demonstrated an understanding of the instructions.   The patient was advised to call back or seek an in-person evaluation if the symptoms worsen or if the condition fails to improve as anticipated.  I provided 18 minutes of non-face-to-face time during this encounter.   Glenford Bayley, NP

## 2019-05-17 ENCOUNTER — Other Ambulatory Visit: Payer: Self-pay

## 2019-05-17 ENCOUNTER — Telehealth: Payer: Self-pay | Admitting: Primary Care

## 2019-05-17 ENCOUNTER — Ambulatory Visit (INDEPENDENT_AMBULATORY_CARE_PROVIDER_SITE_OTHER): Payer: 59 | Admitting: Primary Care

## 2019-05-17 DIAGNOSIS — J189 Pneumonia, unspecified organism: Secondary | ICD-10-CM

## 2019-05-17 DIAGNOSIS — Z20822 Contact with and (suspected) exposure to covid-19: Secondary | ICD-10-CM

## 2019-05-17 MED ORDER — PREDNISONE 10 MG PO TABS: ORAL_TABLET | ORAL | 0 refills | Status: DC

## 2019-05-17 NOTE — Telephone Encounter (Signed)
Left message for patient to call back  

## 2019-05-17 NOTE — Patient Instructions (Addendum)
Bilateral pneumonia from suspected COVID d/t known exposure  - Completes Doxycycline on 11/14 - Continue Mucinex 1,200mg  twice daily - Monitor O2 levels and if sustaining <88-90% let office know/go to ED for eval   - Encourage rest, oral hydration and tylenol prn fever/chills   Asthma: - Continue Symbicort 160 two puffs twice daily (add spacer) - Continue Albtuerol nebulizer scheduled q6 hours until feeling better  - Will send in prednisone taper to have on hand only if asthma symptoms worsen   Follow-up: - Return/call if symptoms are not improving in 2 weeks or worsen in any way  - Will check on you next week by phone call

## 2019-05-20 NOTE — Telephone Encounter (Signed)
LMTCB

## 2019-05-21 ENCOUNTER — Emergency Department (HOSPITAL_COMMUNITY)
Admission: EM | Admit: 2019-05-21 | Discharge: 2019-05-22 | Disposition: A | Discharge status: Home / Self Care | Payer: 59 | Attending: Emergency Medicine | Admitting: Emergency Medicine

## 2019-05-21 ENCOUNTER — Emergency Department (HOSPITAL_COMMUNITY): Payer: 59

## 2019-05-21 ENCOUNTER — Telehealth: Payer: Self-pay | Admitting: Primary Care

## 2019-05-21 ENCOUNTER — Other Ambulatory Visit: Payer: Self-pay

## 2019-05-21 DIAGNOSIS — E039 Hypothyroidism, unspecified: Secondary | ICD-10-CM | POA: Diagnosis not present

## 2019-05-21 DIAGNOSIS — R0602 Shortness of breath: Secondary | ICD-10-CM | POA: Diagnosis present

## 2019-05-21 DIAGNOSIS — Z79899 Other long term (current) drug therapy: Secondary | ICD-10-CM | POA: Insufficient documentation

## 2019-05-21 DIAGNOSIS — R0600 Dyspnea, unspecified: Secondary | ICD-10-CM

## 2019-05-21 DIAGNOSIS — J45909 Unspecified asthma, uncomplicated: Secondary | ICD-10-CM | POA: Diagnosis not present

## 2019-05-21 DIAGNOSIS — I10 Essential (primary) hypertension: Secondary | ICD-10-CM | POA: Insufficient documentation

## 2019-05-21 DIAGNOSIS — R0609 Other forms of dyspnea: Secondary | ICD-10-CM | POA: Insufficient documentation

## 2019-05-21 LAB — BASIC METABOLIC PANEL
Anion gap: 11 (ref 5–15)
BUN: 10 mg/dL (ref 6–20)
CO2: 23 mmol/L (ref 22–32)
Calcium: 9.3 mg/dL (ref 8.9–10.3)
Chloride: 104 mmol/L (ref 98–111)
Creatinine, Ser: 0.89 mg/dL (ref 0.44–1.00)
GFR calc Af Amer: >60 mL/min (ref >60)
GFR calc non Af Amer: >60 mL/min (ref >60)
Glucose, Bld: 95 mg/dL (ref 70–99)
Potassium: 3.9 mmol/L (ref 3.5–5.1)
Sodium: 138 mmol/L (ref 135–145)

## 2019-05-21 LAB — CBC
HCT: 41.3 % (ref 36.0–46.0)
Hemoglobin: 13.7 g/dL (ref 12.0–15.0)
MCH: 29.5 pg (ref 26.0–34.0)
MCHC: 33.2 g/dL (ref 30.0–36.0)
MCV: 88.8 fL (ref 80.0–100.0)
Platelets: 362 10*3/uL (ref 150–400)
RBC: 4.65 MIL/uL (ref 3.87–5.11)
RDW: 14.4 % (ref 11.5–15.5)
WBC: 11.2 10*3/uL — ABNORMAL HIGH (ref 4.0–10.5)
nRBC: 0 % (ref 0.0–0.2)

## 2019-05-21 LAB — I-STAT BETA HCG BLOOD, ED (MC, WL, AP ONLY): I-stat hCG, quantitative: <5 m[IU]/mL (ref <5)

## 2019-05-21 MED ORDER — SODIUM CHLORIDE 0.9% FLUSH: 3.0000 mL | Freq: Once | INTRAVENOUS | Status: DC

## 2019-05-21 NOTE — Telephone Encounter (Signed)
LMTCB x3 for pt. We have attempted to contact pt several times with no success or call back from pt. Per triage protocol, message will be closed.   

## 2019-05-21 NOTE — Telephone Encounter (Signed)
Yes recommend ED evaluation

## 2019-05-21 NOTE — Telephone Encounter (Addendum)
Called and spoke to pt. Pt c/o severe headache, bloody mucus when blowing nose, dizziness, generalized body pain 9/10. Pt became tearful over the phone. Pt took vitals over the phone spO2 90% on RA, HR 125, BP 147/100. Advised pt she will need a further evaluation at the ED due to her s/s. Pt states she also thinks she needs to go to ED. Pt was just at ED on 11/7 and states she feels worse now than she did then. Advised her to go to the nearest ED. Pt verbalized understanding.   Will forward to Derl Barrow, NP (pt had televisit on 11/13 with Beth) and Dr. Lamonte Sakai as Juluis Rainier.

## 2019-05-21 NOTE — ED Triage Notes (Signed)
Pt here for evaluation of body aches and dry cough. Seen for same last week, tested negative for covid and the flu. Finished abx for pneumonia Sunday but feels like she didn't get all the way better. Pt speaking in full sentences, no distress noted even with exertion.

## 2019-05-22 LAB — BRAIN NATRIURETIC PEPTIDE: B Natriuretic Peptide: 11.7 pg/mL (ref 0.0–100.0)

## 2019-05-22 NOTE — ED Notes (Signed)
Gait steady on ambulation, mild dizziness and tachycardia (HR 130) noted on exertion. SPO2 97% consistently.

## 2019-05-22 NOTE — ED Provider Notes (Signed)
[facility] Provider Note  CSN: [id number] Arrival date & time: 05/21/19 1741  Chief Complaint(s) No chief complaint on file.  HPI Carolyn Castro is a 38 y.o. female with a history of asthma requiring nighttime supplemental oxygen who was recently treated for suspected COVID-19 infection presents to the emergency department with persistent shortness of breath and dyspnea on exertion that have been ongoing for several weeks.  She reports that even after treatment she has not had any improvement.  States that she was evaluated by pulmonology and started on prednisone taper after completing her doxycycline antibiotic course on November 14.  Patient is on Symbicort and albuterol.  Reports some improvement with breathing treatments but symptoms return.  Patient has been using her supplemental oxygen throughout the day intermittently.  Reports that she notices some mild improvement.  Endorses dry cough.  Denies any nausea or vomiting.  During recent evaluations earlier this month patient had a CT scan negative for PEs but noted bibasilar and posterior opacities for which she was treated with doxycycline.  HPI  Past Medical History Past Medical History:  Diagnosis Date  . Anxiety   . Asthma   . Back pain   . Hypertension   . Hypothyroid    Patient Active Problem List   Diagnosis Date Noted  . Shortness of breath 05/09/2019  . Acute recurrent frontal sinusitis 05/09/2019  . Allergic rhinitis 02/20/2019  . Normocytic anemia 01/02/2019  . Hyperglycemia 01/02/2019  . GERD (gastroesophageal reflux disease) 01/02/2019  . Acute paraplegia (HCC) 12/26/2018  . Lumbar stenosis with neurogenic claudication 12/26/2018  . Obesity, Class III, BMI 40-49.9 (morbid obesity) (HCC) 12/26/2018  . Essential hypertension 12/26/2018  . Near syncope 12/26/2018  . Lumbar stenosis 12/26/2018  . Asthma 12/25/2018  . Snoring 12/25/2018  . Low back pain 09/04/2018  .  Hypothyroidism 09/04/2018  . Infected incision 07/04/2018  . Adnexal mass 06/29/2018  . Pelvic pain 06/29/2018   Home Medication(s) Prior to Admission medications   Medication Sig Start Date End Date Taking? Authorizing Provider  acetaminophen (TYLENOL) 500 MG tablet Take 1,000 mg by mouth every 6 (six) hours as needed (for pain).     [provider]  albuterol (VENTOLIN HFA) 108 (90 Base) MCG/ACT inhaler Inhale 2 puffs into the lungs every 6 (six) hours as needed for wheezing or shortness of breath. 03/31/19   Junie SpencerHawks, Christy A, FNP  ALPRAZolam Prudy Feeler(XANAX) 0.5 MG tablet Take 1 tablet (0.5 mg total) by mouth 3 (three) times daily as needed for anxiety. Patient taking differently: Take 0.5 mg by mouth 2 (two) times daily as needed for anxiety.  09/27/18   Cottle, Steva Readyarey G Jr., MD  ALPRAZolam Prudy Feeler(XANAX) 1 MG tablet TK 1/2 T PO BID PRN 02/18/19   [provider]  amLODipine (NORVASC) 10 MG tablet Take 10 mg by mouth daily.    [provider]  budesonide-formoterol (SYMBICORT) 160-4.5 MCG/ACT inhaler Inhale 2 puffs into the lungs 2 (two) times daily. 02/20/19   Leslye PeerByrum, Robert S, MD  cholecalciferol (VITAMIN D3) 25 MCG (1000 UT) tablet Take 1,000 Units by mouth daily.    [provider]  CRANBERRY PO Take 1 tablet by mouth 2 (two) times daily.     [provider]  cyclobenzaprine (FLEXERIL) 10 MG tablet TK 1 T PO QD HS PRN 03/05/19   [provider]  diphenhydrAMINE (BENADRYL) 25 MG tablet Take 50 mg by mouth as needed for itching or allergies.  [provider]  Docusate Sodium (STOOL SOFTENER) 100 MG capsule Take 100 mg by mouth 2 (two) times daily.    [provider]  doxycycline (VIBRA-TABS) 100 MG tablet Take 1 tablet (100 mg total) by mouth 2 (two) times daily. 05/09/19   Linwood Dibbles, MD  ferrous sulfate 325 (65 FE) MG tablet Take 1 tablet (325 mg total) by mouth 2 (two) times daily with a meal. 01/04/19   Hongalgi, Maximino Greenland, MD   fexofenadine (ALLEGRA) 180 MG tablet Take 180 mg by mouth daily.    [provider]  furosemide (LASIX) 40 MG tablet Take 40 mg by mouth daily. 01/14/19   [provider]  gabapentin (NEURONTIN) 300 MG capsule Take 300 mg by mouth 3 (three) times daily as needed. 04/17/19   [provider]  ibuprofen (ADVIL,MOTRIN) 200 MG tablet Take 800 mg by mouth every 6 (six) hours as needed (for pain).     [provider]  ipratropium-albuterol (DUONEB) 0.5-2.5 (3) MG/3ML SOLN Take 3 mLs by nebulization every 6 (six) hours as needed. 04/26/19   [provider]  levothyroxine (SYNTHROID, LEVOTHROID) 75 MCG tablet Take 75 mcg by mouth daily before breakfast.    [provider]  meloxicam (MOBIC) 15 MG tablet Take 1 tablet (15 mg total) by mouth daily. 04/29/19   Felecia Shelling, DPM  mometasone (NASONEX) 50 MCG/ACT nasal spray Place 2 sprays into the nose daily.    [provider]  montelukast (SINGULAIR) 10 MG tablet Take 10 mg by mouth every evening.     [provider]  norethindrone-ethinyl estradiol (MICROGESTIN,JUNEL,LOESTRIN) 1-20 MG-MCG tablet Take 1 tablet by mouth daily.    [provider]  omeprazole (PRILOSEC OTC) 20 MG tablet Take 20 mg by mouth 2 (two) times daily.     [provider]  omeprazole (PRILOSEC) 20 MG capsule TK 1 C PO BID 03/02/19   [provider]  ondansetron (ZOFRAN-ODT) 4 MG disintegrating tablet DIS 1 T ON THE TONGUE QD PRF NAUSEA 03/05/19   [provider]  oxyCODONE (OXY IR/ROXICODONE) 5 MG immediate release tablet Take 1 tablet (5 mg total) by mouth every 6 (six) hours as needed for moderate pain or severe pain. 12/30/18   Dhungel, Nishant, MD  PARoxetine (PAXIL) 30 MG tablet Take 2 tablets (60 mg total) by mouth daily. 04/12/19   Cottle, Steva Ready., MD  PAROXETINE HCL PO Take by mouth. unkown by patient re dos but not 60 mg daily    [provider]  predniSONE  (DELTASONE) 10 MG tablet Take 4 tabs po daily x 2 days; then 3 tabs for 2 days; then 2 tabs for 2 days; then 1 tab for 2 days 05/17/19   Glenford Bayley, NP  Probiotic Product (PROBIOTIC DAILY PO) Take 1 tablet by mouth daily.    [provider]  prochlorperazine (COMPAZINE) 10 MG tablet Take 1 tablet (10 mg total) by mouth 2 (two) times daily as needed for nausea or vomiting. 05/11/19   Jonna Clark, MD  senna (SENOKOT) 8.6 MG TABS tablet Take 1 tablet (8.6 mg total) by mouth daily as needed for mild constipation or moderate constipation. 12/30/18   Dhungel, Theda Belfast, MD  traZODone (DESYREL) 50 MG tablet TAKE 1 TO 2 TABLETS(50 TO 100 MG) BY MOUTH AT BEDTIME 05/15/19   Cottle, Steva Ready., MD  vitamin B-12 1000 MCG tablet Take 1 tablet (1,000 mcg total) by mouth daily. 01/05/19   Hongalgi, Maximino Greenland,  MD                                                                                                                                    Past Surgical History Past Surgical History:  Procedure Laterality Date  . BACK SURGERY    . COLONOSCOPY    . ESOPHAGOGASTRODUODENOSCOPY (EGD) WITH PROPOFOL N/A 01/28/2019   Procedure: ESOPHAGOGASTRODUODENOSCOPY (EGD) WITH PROPOFOL;  Surgeon: Carman Ching, MD;  Location: [facility];  Service: Endoscopy;  Laterality: N/A;  . LAPAROSCOPIC OVARIAN CYSTECTOMY Left 06/29/2018   Procedure: LAPAROSCOPIC OVARIAN CYSTECTOMY;  Surgeon: Gerald Leitz, MD;  Location: [facility];  Service: Gynecology;  Laterality: Left;  . TONSILLECTOMY    . WISDOM TOOTH EXTRACTION     Family History Family History  Problem Relation Age of Onset  . Hypertension Mother   . Hypertension Father     Social History Social History   Tobacco Use  . Smoking status: Never Smoker  . Smokeless tobacco: Never Used  Substance Use Topics  . Alcohol use: Never    Frequency: Never  . Drug use: Never   Allergies Ciprofloxacin hcl  Review of Systems Review of Systems  All other systems are reviewed and are negative for acute change except as noted in the HPI  Physical Exam Vital Signs  I have reviewed the triage vital signs BP (!) 143/104   Pulse (!) 104   Temp 98.4 F (36.9 C) (Oral)   Resp 17   SpO2 95%   Physical Exam Vitals signs reviewed.  Constitutional:      General: She is not in acute distress.    Appearance: She is well-developed. She is morbidly obese. She is not diaphoretic.  HENT:     Head: Normocephalic and atraumatic.     Nose: Nose normal.  Eyes:     General: No scleral icterus.       Right eye: No discharge.        Left eye: No discharge.     Conjunctiva/sclera: Conjunctivae normal.     Pupils: Pupils are equal, round, and reactive to light.  Neck:     Musculoskeletal: Normal range of motion and neck supple.  Cardiovascular:     Rate and Rhythm: Normal rate and regular rhythm.     Heart sounds: No murmur. No friction rub. No gallop.   Pulmonary:     Effort: Pulmonary effort is normal. Tachypnea present. No respiratory distress.     Breath sounds: Normal breath sounds. No stridor. No rales.     Comments: Patient gets winded even with speaking Abdominal:     General: There is no distension.     Palpations: Abdomen is soft.     Tenderness: There is no abdominal tenderness.  Musculoskeletal:        General: No tenderness.     Right lower leg: 1+ Pitting Edema present.     Left lower leg:  1+ Pitting Edema present.  Skin:    General: Skin is warm and dry.     Findings: No erythema or rash.  Neurological:     Mental Status: She is alert and oriented to person, place, and time.     ED Results and Treatments Labs (all labs ordered are listed, but only abnormal results are displayed) Labs Reviewed  CBC - Abnormal; Notable for the following components:      Result Value   WBC 11.2 (*)    All other components within normal limits  BASIC METABOLIC PANEL  BRAIN NATRIURETIC PEPTIDE  I-STAT BETA HCG BLOOD, ED ([facility], [facility],  [facility] ONLY)                                                                                                                         EKG  EKG Interpretation  Date/Time:  Wednesday May 22 2019 01:29:59 EST Ventricular Rate:  98 PR Interval:    QRS Duration: 94 QT Interval:  349 QTC Calculation: 446 R Axis:   -3 Text Interpretation: Sinus rhythm Low voltage, precordial leads No significant change since last tracing Confirmed by Addison Lank [phone]) on 05/22/2019 2:48:06 AM      Radiology Dg Chest 2 View  Result Date: 05/21/2019 CLINICAL DATA:  Shortness of breath. Pneumonia. Asthma. Hypertension. EXAM: CHEST - 2 VIEW COMPARISON:  05/11/2019 FINDINGS: Midline trachea. Normal heart size and mediastinal contours. No pleural effusion or pneumothorax. Clear lungs. IMPRESSION: No acute cardiopulmonary disease. Electronically Signed   By: Abigail Miyamoto M.D.   On: 05/21/2019 18:52    Pertinent labs & imaging results that were available during my care of the patient were reviewed by me and considered in my medical decision making (see chart for details).  Medications Ordered in ED Medications  sodium chloride flush (NS) 0.9 % injection 3 mL (has no administration in time range)                                                                                                                                    Procedures Procedures  (including critical care time)  Medical Decision Making / ED Course I have reviewed the nursing notes for this encounter and the patient's prior records (if available in EHR or on provided paperwork).   Carolyn Castro was evaluated in Emergency Department on 05/22/2019 for the symptoms described in the history of  present illness. She was evaluated in the context of the global COVID-19 pandemic, which necessitated consideration that the patient might be at risk for infection with the SARS-CoV-2 virus that causes COVID-19. Institutional protocols and algorithms  that pertain to the evaluation of patients at risk for COVID-19 are in a state of rapid change based on information released by regulatory bodies including the CDC and federal and state organizations. These policies and algorithms were followed during the patient's care in the ED.  Patient is complaining of dyspnea on exertion following pneumonia unlikely Covid infection.  Patient is hemodynamically stable and satting well on room air here.  Labs notable for mild leukocytosis.  No anemia.  No significant electrolyte derangements or renal insufficiency.  Patient has evidence of peripheral edema on exam.  BNP ordered to assess for heart failure but within normal limits.  Chest x-ray without evidence of pneumonia, pneumothorax, pulmonary edema, or pleural effusions.  Patient ambulated and maintain sats above 95% on room air.  Patient did become mildly short of breath.  Feel there is a component of deconditioning.  Recommended use of oxygen at home during exercise.  Slow progressive exercise regimen and close follow-up with PCP and pulmonologist recommended.  The patient appears reasonably screened and/or stabilized for discharge and I doubt any other medical condition or other [facility] requiring further screening, evaluation, or treatment in the ED at this time prior to discharge.  The patient is safe for discharge with strict return precautions.       Final Clinical Impression(s) / ED Diagnoses Final diagnoses:  Dyspnea on exertion    The patient appears reasonably screened and/or stabilized for discharge and I doubt any other medical condition or other [facility] requiring further screening, evaluation, or treatment in the ED at this time prior to discharge.  Disposition: Discharge  Condition: Good  I have discussed the results, Dx and Tx plan with the patient who expressed understanding and agree(s) with the plan. Discharge instructions discussed at great length. The patient was given strict  return precautions who verbalized understanding of the instructions. No further questions at time of discharge.    ED Discharge Orders    None       Follow Up: Shirlean Mylar, MD [address] [place] [address] [phone]  Schedule an appointment as soon as possible for a visit        This chart was dictated using voice recognition software.  Despite best efforts to proofread,  errors can occur which can change the documentation meaning.   Nira Conn, MD 05/22/19 [phone]

## 2019-05-22 NOTE — ED Notes (Signed)
Patient verbalizes understanding of discharge instructions. Opportunity for questioning and answers were provided. Armband removed by staff, pt discharged from ED.  

## 2019-05-28 ENCOUNTER — Telehealth: Payer: Self-pay | Admitting: Emergency Medicine

## 2019-05-28 NOTE — Telephone Encounter (Signed)
Called pt and advised message from the provider. Pt understood and verbalized understanding. Nothing further is needed.    

## 2019-05-28 NOTE — Telephone Encounter (Signed)
Recommend ibuprofen three times a day with food. Stop if worsens wheezing or elevates blood pressure. Important to practice deep breathing exercises, brace with pillow.

## 2019-05-28 NOTE — Telephone Encounter (Signed)
Spoke with pt, she states she is recovering from viral pneumonia and still has some pain in her right lung. She states the pain is sharp  and getting worse. She denies fever but has a some cough with no mucus. She is more fatigued than normal. She has finished with her antibiotic but still taking prednisone taper she received from [facility] on 05/17/2019. She isnt taking anything for the pain as of yet. Beth please advise on next steps.       Follow Up Instructions:  - Return/call if symptoms are not improving in 2 weeks or worsen in any way  - Will check on patient next week by phone call   I discussed the assessment and treatment plan with the patient. The patient was provided an opportunity to ask questions and all were answered. The patient agreed with the plan and demonstrated an understanding of the instructions.  The patient was advised to call back or seek an in-person evaluation if the symptoms worsen or if the condition fails to improve as anticipated.  I provided 18 minutes of non-face-to-face time during this encounter.   Martyn Ehrich, NP

## 2019-06-08 ENCOUNTER — Other Ambulatory Visit: Payer: Self-pay | Admitting: Psychiatry

## 2019-06-08 DIAGNOSIS — F4001 Agoraphobia with panic disorder: Secondary | ICD-10-CM

## 2019-06-08 DIAGNOSIS — F325 Major depressive disorder, single episode, in full remission: Secondary | ICD-10-CM

## 2019-06-12 ENCOUNTER — Other Ambulatory Visit: Payer: Self-pay

## 2019-06-12 ENCOUNTER — Ambulatory Visit (INDEPENDENT_AMBULATORY_CARE_PROVIDER_SITE_OTHER): Payer: 59 | Admitting: Psychiatry

## 2019-06-12 ENCOUNTER — Encounter: Payer: Self-pay | Admitting: Psychiatry

## 2019-06-12 DIAGNOSIS — F4001 Agoraphobia with panic disorder: Secondary | ICD-10-CM

## 2019-06-12 DIAGNOSIS — F325 Major depressive disorder, single episode, in full remission: Secondary | ICD-10-CM

## 2019-06-12 DIAGNOSIS — F5105 Insomnia due to other mental disorder: Secondary | ICD-10-CM

## 2019-06-12 MED ORDER — TRAZODONE HCL 50 MG PO TABS: 100.0000 mg | ORAL_TABLET | Freq: Every evening | ORAL | 1 refills | Status: DC | PRN

## 2019-06-12 MED ORDER — PAROXETINE HCL 30 MG PO TABS: 60.0000 mg | ORAL_TABLET | Freq: Every day | ORAL | 0 refills | Status: DC

## 2019-06-12 NOTE — Progress Notes (Signed)
Carolyn Castro [id number] February 22, 1981 38 y.o.    Subjective:   Patient ID:  Carolyn Castro is a 38 y.o. (DOB October 15, 1980) female.  Chief Complaint:  Chief Complaint  Patient presents with  . Follow-up    Medication Management  . Other    Panic disorder with agoraphobia  . Anxiety    HPI Carolyn Castro presents to the office today for follow-up of panic disorder and depression.    When seen September 26, 2017 she had previously been switched from sertraline to paroxetine 40 mg for panic attacks.  She had a good response until a recent bout of health problems had noticed a resurgence of panic.  We decided to increase paroxetine to greater than the usual 60 mg daily from 40 mg daily on August 23, 2018.    When seen May 2020.Health gotten better and anxiety has come down.  Increase in paroxetine has helped her also.  No med changes were made.  She called back in June saying that her anxiety and insomnia was worse and she was having to take steroids for asthma.  She was allowed to increase Xanax to 0.5 mg #5 daily.  Encouraged to reduce the dose as soon as possible.  It was discovered that she was also getting Xanax from another provider and she was cautioned about that as being inappropriate. Subsequently she is gotten Xanax from another provider on more than one occasion. She called April 09, 2019 in the catering her anxiety was worse.  She was worked in to an appointment today.  Last seen April 12, 2019.  She accidentally reduced the dosage a couple of months prior and  and now the anxiety is out of hand again. Therefore  Increase the paroxetine to greater than usual dose at 60 to try to limit the need of Bz.  Increase in paroxetine was previously effective at the higher dose.[place] PDMP shows she is getting Xanax 1 mg quantity 30 from PCP, Dr. Maurice Small and oxycodone 5 mg tablets and quantities of 40-60 from [facility].   She last filled a Xanax prescription from our office  in March 2020.  Says she's stopped pain meds.   Last fill date 8/27.  3 ER visits since she was last here for shortness of breath  Increased paroxetine to 60 and it's helped.  Only having to use Xanax 1 every 10 days.  Better handling stress and less panic.  Mostly back to normal.  Likes Paxil better than Zoloft.  Not as sig depressive spells either.  Conc and appetite and function is fair.   Early November had pneumonia and otherwise function is good.    Had low back surgery since here end of June.  Back at work since end of August.  Doesn't like her job bc they always have her on edge.  They are demeaning.    For unknown reasons she accidentally decreased the paroxetine and now she can't remember if she's taking 20 or 40 mg daily but it's not 60 mg as in the past since at least July.  Sleeps better with  trazodone.    Past psych meds:  Zoloft 200 NR, , paxil 60,  Xanax., hydroxyzine, trazodone helped.  Review of Systems:  Review of Systems  Respiratory: Positive for shortness of breath and wheezing. Negative for cough and chest tightness.   Gastrointestinal: Positive for abdominal pain.  Musculoskeletal: Positive for back pain.  Neurological: Negative for dizziness, tremors and weakness.  Psychiatric/Behavioral: Negative  for agitation, behavioral problems, confusion, decreased concentration, dysphoric mood, hallucinations, self-injury, sleep disturbance and suicidal ideas. The patient is not nervous/anxious and is not hyperactive.   asthma about the same.  Medications: I have reviewed the patient's current medications.  Current Outpatient Medications  Medication Sig Dispense Refill  . acetaminophen (TYLENOL) 500 MG tablet Take 1,000 mg by mouth every 6 (six) hours as needed (for pain).     Marland Kitchen albuterol (VENTOLIN HFA) 108 (90 Base) MCG/ACT inhaler Inhale 2 puffs into the lungs every 6 (six) hours as needed for wheezing or shortness of breath. 8 g 0  . ALPRAZolam (XANAX) 0.5 MG tablet  Take 1 tablet (0.5 mg total) by mouth 3 (three) times daily as needed for anxiety. (Patient taking differently: Take 0.5 mg by mouth 2 (two) times daily as needed for anxiety. ) 90 tablet 1  . ALPRAZolam (XANAX) 1 MG tablet TK 1/2 T PO BID PRN    . amLODipine (NORVASC) 10 MG tablet Take 10 mg by mouth daily.    . budesonide-formoterol (SYMBICORT) 160-4.5 MCG/ACT inhaler Inhale 2 puffs into the lungs 2 (two) times daily. 1 Inhaler 5  . cholecalciferol (VITAMIN D3) 25 MCG (1000 UT) tablet Take 1,000 Units by mouth daily.    Marland Kitchen CRANBERRY PO Take 1 tablet by mouth 2 (two) times daily.     . cyclobenzaprine (FLEXERIL) 10 MG tablet TK 1 T PO QD HS PRN    . diphenhydrAMINE (BENADRYL) 25 MG tablet Take 50 mg by mouth as needed for itching or allergies.     Tery Sanfilippo Sodium (STOOL SOFTENER) 100 MG capsule Take 100 mg by mouth 2 (two) times daily.    . ferrous sulfate 325 (65 FE) MG tablet Take 1 tablet (325 mg total) by mouth 2 (two) times daily with a meal. 60 tablet 0  . fexofenadine (ALLEGRA) 180 MG tablet Take 180 mg by mouth daily.    . furosemide (LASIX) 40 MG tablet Take 40 mg by mouth daily.    Marland Kitchen gabapentin (NEURONTIN) 300 MG capsule Take 300 mg by mouth 3 (three) times daily as needed.    Marland Kitchen ibuprofen (ADVIL,MOTRIN) 200 MG tablet Take 800 mg by mouth every 6 (six) hours as needed (for pain).     Marland Kitchen ipratropium-albuterol (DUONEB) 0.5-2.5 (3) MG/3ML SOLN Take 3 mLs by nebulization every 6 (six) hours as needed.    Marland Kitchen levothyroxine (SYNTHROID, LEVOTHROID) 75 MCG tablet Take 75 mcg by mouth daily before breakfast.    . meloxicam (MOBIC) 15 MG tablet Take 1 tablet (15 mg total) by mouth daily. 30 tablet 1  . mometasone (NASONEX) 50 MCG/ACT nasal spray Place 2 sprays into the nose daily.    . montelukast (SINGULAIR) 10 MG tablet Take 10 mg by mouth every evening.     . Multiple Vitamins-Minerals (WOMENS MULTI PO) Take by mouth.    . norethindrone-ethinyl estradiol (MICROGESTIN,JUNEL,LOESTRIN) 1-20 MG-MCG  tablet Take 1 tablet by mouth daily.    Marland Kitchen omeprazole (PRILOSEC OTC) 20 MG tablet Take 20 mg by mouth 2 (two) times daily.     . ondansetron (ZOFRAN-ODT) 4 MG disintegrating tablet DIS 1 T ON THE TONGUE QD PRF NAUSEA    . PARoxetine (PAXIL) 30 MG tablet Take 2 tablets (60 mg total) by mouth daily. 180 tablet 0  . Probiotic Product (PROBIOTIC DAILY PO) Take 1 tablet by mouth daily.    . traZODone (DESYREL) 50 MG tablet Take 2 tablets (100 mg total) by mouth at bedtime as  needed for sleep. 180 tablet 1   No current facility-administered medications for this visit.     Medication Side Effects: None  Allergies:  Allergies  Allergen Reactions  . Ciprofloxacin Hcl Diarrhea and Other (See Comments)    Developed C-Diff    Past Medical History:  Diagnosis Date  . Anxiety   . Asthma   . Back pain   . Hypertension   . Hypothyroid     Family History  Problem Relation Age of Onset  . Hypertension Mother   . Hypertension Father     Social History   Socioeconomic History  . Marital status: Single    Spouse name: Not on file  . Number of children: Not on file  . Years of education: Not on file  . Highest education level: Not on file  Occupational History  . Not on file  Social Needs  . Financial resource strain: Not on file  . Food insecurity    Worry: Not on file    Inability: Not on file  . Transportation needs    Medical: Not on file    Non-medical: Not on file  Tobacco Use  . Smoking status: Never Smoker  . Smokeless tobacco: Never Used  Substance and Sexual Activity  . Alcohol use: Never    Frequency: Never  . Drug use: Never  . Sexual activity: Not Currently  Lifestyle  . Physical activity    Days per week: Not on file    Minutes per session: Not on file  . Stress: Not on file  Relationships  . Social Musician on phone: Not on file    Gets together: Not on file    Attends religious service: Not on file    Active member of club or organization:  Not on file    Attends meetings of clubs or organizations: Not on file    Relationship status: Not on file  . Intimate partner violence    Fear of current or ex partner: Not on file    Emotionally abused: Not on file    Physically abused: Not on file    Forced sexual activity: Not on file  Other Topics Concern  . Not on file  Social History Narrative  . Not on file    Past Medical History, Surgical history, Social history, and Family history were reviewed and updated as appropriate.   Please see review of systems for further details on the patient's review from today.   Objective:   Physical Exam:  There were no vitals taken for this visit.  Physical Exam Constitutional:      General: She is not in acute distress.    Appearance: She is well-developed. She is obese.  Neurological:     Mental Status: She is alert and oriented to person, place, and time.     Cranial Nerves: No dysarthria.  Psychiatric:        Attention and Perception: She is attentive. She does not perceive auditory hallucinations.        Mood and Affect: Mood is anxious. Mood is not depressed. Affect is not labile, blunt, angry or inappropriate.        Speech: Speech normal. Speech is not slurred.        Behavior: Behavior normal. Behavior is not slowed or withdrawn.        Thought Content: Thought content normal. Thought content does not include homicidal or suicidal ideation. Thought content does not include homicidal or suicidal  plan.        Cognition and Memory: Cognition normal.        Judgment: Judgment normal.     Comments: Insight intact. No auditory or visual hallucinations. Anxiety worse.     Lab Review:     Component Value Date/Time   NA 138 05/21/2019 1830   K 3.9 05/21/2019 1830   CL 104 05/21/2019 1830   CO2 23 05/21/2019 1830   GLUCOSE 95 05/21/2019 1830   BUN 10 05/21/2019 1830   CREATININE 0.89 05/21/2019 1830   CALCIUM 9.3 05/21/2019 1830   PROT 6.6 01/03/2019 0518   ALBUMIN 3.1  (L) 01/03/2019 0518   AST 17 01/03/2019 0518   ALT 29 01/03/2019 0518   ALKPHOS 53 01/03/2019 0518   BILITOT 0.5 01/03/2019 0518   GFRNONAA >60 05/21/2019 1830   GFRAA >60 05/21/2019 1830       Component Value Date/Time   WBC 11.2 (H) 05/21/2019 1830   RBC 4.65 05/21/2019 1830   HGB 13.7 05/21/2019 1830   HCT 41.3 05/21/2019 1830   PLT 362 05/21/2019 1830   MCV 88.8 05/21/2019 1830   MCH 29.5 05/21/2019 1830   MCHC 33.2 05/21/2019 1830   RDW 14.4 05/21/2019 1830   LYMPHSABS 3.3 01/02/2019 0329   MONOABS 0.8 01/02/2019 0329   EOSABS 0.1 01/02/2019 0329   BASOSABS 0.0 01/02/2019 0329    No results found for: POCLITH, LITHIUM   No results found for: PHENYTOIN, PHENOBARB, VALPROATE, CBMZ   .res Assessment: Plan:    Panic disorder with agoraphobia - Plan: PARoxetine (PAXIL) 30 MG tablet  Major depressive disorder with single episode, in full remission (HCC) - Plan: PARoxetine (PAXIL) 30 MG tablet  Insomnia due to mental condition - Plan: traZODone (DESYREL) 50 MG tablet    History of severe panic disorder and mild to moderate major depression which did not respond to sertraline 200 mg a day.  She accidentally reduced the dosage prior to last appt and had relapse. We increased the paroxetine and it worked.. Continue paroxetine to greater than usual dose at 60 to try to limit the need of Bz.  It has worked to manage the anxiety and mood.  Increase in paroxetine was effective at further reducing anxiety below the level she had at 40 mg a day.   We discussed the short-term risks associated with benzodiazepines including sedation and increased fall risk among others.  Discussed long-term side effect risk including dependence, potential withdrawal symptoms, and the potential eventual dose-related risk of dementia.  Disc risk with opiates.  She is not planning on consistent opiates.  Disc risk of increasing the Xanax.  For sleep continue trazodone 50-100 mg HS.  Follow-up 3-414mos.   She agrees with the plan. No changes without calling.  Meredith Staggersarey Cottle MD, DFAPA  Please see After Visit Summary for patient specific instructions.  Future Appointments  Date Time Provider Department Center  07/08/2019  9:30 AM [facility]    No orders of the defined types were placed in this encounter.     -------------------------------

## 2019-06-13 ENCOUNTER — Inpatient Hospital Stay: Admission: RE | Admit: 2019-06-13 | Payer: 59 | Source: Ambulatory Visit

## 2019-07-08 ENCOUNTER — Other Ambulatory Visit: Payer: Self-pay

## 2019-07-08 ENCOUNTER — Ambulatory Visit (INDEPENDENT_AMBULATORY_CARE_PROVIDER_SITE_OTHER)
Admission: RE | Admit: 2019-07-08 | Discharge: 2019-07-08 | Disposition: A | Discharge status: Home / Self Care | Payer: 59 | Source: Ambulatory Visit | Attending: Pulmonary Disease | Admitting: Pulmonary Disease

## 2019-07-08 DIAGNOSIS — J0111 Acute recurrent frontal sinusitis: Secondary | ICD-10-CM | POA: Diagnosis not present

## 2019-07-08 NOTE — Progress Notes (Signed)
LMTCB x 1 

## 2019-07-11 ENCOUNTER — Telehealth: Payer: Self-pay | Admitting: Emergency Medicine

## 2019-07-11 NOTE — Telephone Encounter (Signed)
Advised pt of results. Pt understood and nothing further is needed.     [facility] WO CM: Comments to Patient   CT scan of sinuses shows no sinus disease.  This is good news. No further recommendations or changes to plan of care.  Elisha Headland, FNP

## 2019-08-02 ENCOUNTER — Other Ambulatory Visit: Payer: Self-pay

## 2019-08-02 ENCOUNTER — Encounter: Payer: Self-pay | Admitting: Pulmonary Disease

## 2019-08-02 ENCOUNTER — Telehealth (INDEPENDENT_AMBULATORY_CARE_PROVIDER_SITE_OTHER): Payer: 59 | Admitting: Pulmonary Disease

## 2019-08-02 ENCOUNTER — Ambulatory Visit (INDEPENDENT_AMBULATORY_CARE_PROVIDER_SITE_OTHER): Payer: 59 | Admitting: Internal Medicine

## 2019-08-02 VITALS — BP 138/92 | HR 94 | Ht 64.0 in | Wt 298.0 lb

## 2019-08-02 DIAGNOSIS — Z20822 Contact with and (suspected) exposure to covid-19: Secondary | ICD-10-CM | POA: Diagnosis not present

## 2019-08-02 DIAGNOSIS — J4541 Moderate persistent asthma with (acute) exacerbation: Secondary | ICD-10-CM | POA: Diagnosis not present

## 2019-08-02 DIAGNOSIS — R0602 Shortness of breath: Secondary | ICD-10-CM

## 2019-08-02 DIAGNOSIS — R05 Cough: Secondary | ICD-10-CM

## 2019-08-02 DIAGNOSIS — R059 Cough, unspecified: Secondary | ICD-10-CM

## 2019-08-02 MED ORDER — IPRATROPIUM BROMIDE 0.02 % IN SOLN: 0.5000 mg | Freq: Four times a day (QID) | RESPIRATORY_TRACT | 12 refills | Status: DC | PRN

## 2019-08-02 MED ORDER — BENZONATATE 100 MG PO CAPS: 100.0000 mg | ORAL_CAPSULE | Freq: Two times a day (BID) | ORAL | 0 refills | Status: DC | PRN

## 2019-08-02 NOTE — Assessment & Plan Note (Signed)
Plan: Present to [facility] respiratory clinic today at 645

## 2019-08-02 NOTE — Assessment & Plan Note (Signed)
Plan: We will schedule in person evaluation at the [facility] respiratory clinic on 08/02/2019 May need to consider a chest x-ray based off that exam Continue nebulized meds and respiratory medications as prescribed

## 2019-08-02 NOTE — Progress Notes (Signed)
Virtual Visit via Video Note  I connected with SHAWNTIA MANGAL on 08/02/19 at  3:00 PM EST by a video enabled telemedicine application and verified that I am speaking with the correct person using two identifiers.  Location: Patient: Home Provider: Office - [facility] - [address] [place], [address], [place], [place] [address]  I discussed the limitations of evaluation and management by telemedicine and the availability of in person appointments. The patient expressed understanding and agreed to proceed. I also discussed with the patient that there may be a patient responsible charge related to this service. The patient expressed understanding and agreed to proceed.  Patient consented to consult via telephone: Yes People present and their role in pt care: Pt   History of Present Illness:  39 year old never smoker followed in our office for asthma recurrent sinusitis  Past medical history: Hypothyroidism, obesity, hypertension, GERD, depression, panic disorder Smoking history: Never smoker Maintenance: Symbicort 160 Patient of Dr. Delton Coombes  Chief complaint: Short of breath, left-sided lung pain, hard to breathe   39 year old female never smoker followed in our office for asthma and recurrent sinusitis.  She contacted her office on 08/02/2019 reported increased shortness of breath, left-sided lung pain and trouble breathing.  Patient reports that she feels how she fell in November/2020 when she was diagnosed with pneumonia.  She denies fevers.  She does admit that she is having increased fatigue.  She reports that typically this is revealed by using her rescue inhaler but has not been helping much.  She feels that her symptom onset was on 07/29/2019.  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  Observations/Objective:  01/02/2019-CTA chest-mild pneumonia seen in left lower and right upper lobes, left lower lobe  opacity has segmental appearance favors bacterial pneumonia  12/26/2018-CT head-sinuses.  Nasal sinuses and mastoid air cells are clear, orbits are clear  01/30/2019-pulmonary function test-FVC 2.88 (77% predicted), postbronchodilator ratio 76, postbronchodilator FEV1 2.13 (69% predicted), no positive bronchodilator response, no bronchodilator response, DLCO 23.37 (105% predicted)  01/08/2019-Home sleep study-AHI 4.7, SaO2 low 77% time with saturation less than 85% was 58 minutes, indicating nocturnal hypoxemia  12/27/2018-echocardiogram-LV ejection fraction 60 to 65%, cavity size was normal  Social History   Tobacco Use  Smoking Status Never Smoker  Smokeless Tobacco Never Used   Immunization History  Administered Date(s) Administered  . Influenza,inj,Quad PF,6+ Mos 02/20/2019  . Influenza-Unspecified 03/26/2018    Assessment and Plan:  Asthma Plan: We will schedule in person evaluation at the [facility] clinic on 08/02/2019 May need to consider a chest x-ray based off that exam Continue nebulized meds and respiratory medications as prescribed   Shortness of breath Plan: Present to [facility] respiratory clinic today at 645  Suspected COVID-19 virus infection Likely needs to be tested for Covid based off of increased shortness of breath, lung pain, wheezing, cough, congestion, fatigue starting on 07/29/2019 Patient is a school teacher teaching in person  If patient test positive she would be a good candidate for the monoclonal antibody infusion, this would need to be given within the first 10 days of symptom onset so last day where she could be able to receive the infusion would be on 08/08/19   Follow Up Instructions:  Return in about 4 weeks (around 08/30/2019), or if symptoms worsen or fail to improve, for Follow up with Buelah Manis AGNP-BC, Follow up with Dr. Delton Coombes, Follow up with Arlys John  Virgia Kelner FNP-C.   I discussed the assessment and treatment plan with the patient. The patient was  provided an opportunity to ask questions and all were answered. The patient agreed with the plan and demonstrated an understanding of the instructions.   The patient was advised to call back or seek an in-person evaluation if the symptoms worsen or if the condition fails to improve as anticipated.  I provided  25 minutes of non-face-to-face time during this encounter.   Lauraine Rinne, NP

## 2019-08-02 NOTE — Patient Instructions (Signed)
10 Things You Can Do to Manage Your COVID-19 Symptoms at Home If you have possible or confirmed COVID-19: 1. Stay home from work and school. And stay away from other public places. If you must go out, avoid using any kind of public transportation, ridesharing, or taxis. 2. Monitor your symptoms carefully. If your symptoms get worse, call your healthcare provider immediately. 3. Get rest and stay hydrated. 4. If you have a medical appointment, call the healthcare provider ahead of time and tell them that you have or may have COVID-19. 5. For medical emergencies, call 911 and notify the dispatch personnel that you have or may have COVID-19. 6. Cover your cough and sneezes with a tissue or use the inside of your elbow. 7. Wash your hands often with soap and water for at least 20 seconds or clean your hands with an alcohol-based hand sanitizer that contains at least 60% alcohol. 8. As much as possible, stay in a specific room and away from other people in your home. Also, you should use a separate bathroom, if available. If you need to be around other people in or outside of the home, wear a mask. 9. Avoid sharing personal items with other people in your household, like dishes, towels, and bedding. 10. Clean all surfaces that are touched often, like counters, tabletops, and doorknobs. Use household cleaning sprays or wipes according to the label instructions. cdc.gov/coronavirus 01/02/2019 This information is not intended to replace advice given to you by your health care provider. Make sure you discuss any questions you have with your health care provider. Document Revised: 06/06/2019 Document Reviewed: 06/06/2019 Elsevier Patient Education  2020 Elsevier Inc.  

## 2019-08-02 NOTE — Progress Notes (Signed)
Respiratory Clinic Note   Patient had a pulmonology visit today and was scheduled for respiratory clinic for evaluation. 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   Patient's initial symptoms began on 1/25 as SOB, wheezing, cough, rhinorrhea, congestion, fatigue. Has some sharp pains in her chest with deep inspiration.  Covid testing has not been completed.  Current symptoms include as above.   Symptoms are worsening.  Symptomatic treatment includes: decongestant   Significant medical comorbidities present include: PMH of hypertension, asthma, sleep apnea, diabetes, obesity   Positive review of systems for Covid infection are documented above in HPI.   Physical exam:  Vitals:   08/02/19 1841  BP: (!) 138/92  Pulse: 94  Pulse Ox 97%   General appearance: Adequately nourished; no acute distress, increased work of breathing is present.   Lymphatic: No lymphadenopathy about the head, neck, axilla. Eyes: No conjunctival inflammation or lid edema is present. There is no scleral icterus. Ears:  External ear exam shows no significant lesions or deformities.   Nose:  External nasal examination shows no deformity or inflammation. Nasal mucosa are pink and moist without lesions, exudates Oral exam:  Lips and gums are healthy appearing. There is no oropharyngeal erythema or exudate. Neck:  No thyromegaly, masses, tenderness noted.    Heart:  Normal rate and regular rhythm. S1 and S2 normal without gallop, murmur, click, rub  Lungs: Chest clear to auscultation without wheezes, rhonchi, rales, rubs. Becomes winded with speaking. No accessory muscle use.  Abdomen: Bowel sounds are normal. Abdomen is soft and nontender with no organomegaly, hernias, masses. GU: Deferred  Extremities:  No cyanosis, clubbing, edema  Neurologic exam :Grossly intact. Normal gait.  Skin: Warm & dry w/o tenting. No significant lesions or rash.  1. Suspected COVID-19 virus infection 2. Shortness of  breath 3. Cough  Symptoms concerning for COVID-19 infection. Will test today. Patient is using albuterol frequently and still feeling SOB. Given history of asthma, will also prescribe Ipratropium. Have prescribed Tessalon for cough. Lung exam is clear so will hold off on CXR for now. Discussed supportive care measures. Need for rest and hydration. Discussed Zinc and Vit C. Recommended quarantining for presumed COVID-19 infection. Have also ordered MyChart monitoring. Advised to seek emergency medical care if worsens. If still having significant SOB and cough in a week advised patient to have virtual PCP visit and would consider CXR at that time to ensure absence of a secondary pneumonia.     Marcy Siren, D.O. 08/02/2019, 6:59 PM

## 2019-08-02 NOTE — Assessment & Plan Note (Signed)
Likely needs to be tested for Covid based off of increased shortness of breath, lung pain, wheezing, cough, congestion, fatigue starting on 07/29/2019 Patient is a school teacher teaching in person  If patient test positive she would be a good candidate for the monoclonal antibody infusion, this would need to be given within the first 10 days of symptom onset so last day where she could be able to receive the infusion would be on 08/08/19

## 2019-08-02 NOTE — Patient Instructions (Signed)
You were seen today by Coral Ceo, NP  for:   1. Moderate persistent asthma with acute exacerbation 2. Shortness of breath 3. Suspected COVID-19 virus infection  We will coordinate in person evaluation at the [facility] clinic tonight at 6:45 PM  Continue respiratory medications as prescribed  You likely would benefit from chest x-ray imaging as well as flu and Covid testing  Follow Up:    Return in about 4 weeks (around 08/30/2019), or if symptoms worsen or fail to improve, for Follow up with Buelah Manis AGNP-BC, Follow up with Dr. Delton Coombes, Follow up with Elisha Headland FNP-C.   Please do your part to reduce the spread of COVID-19:      Reduce your risk of any infection  and COVID19 by using the similar precautions used for avoiding the common cold or flu:  Marland Kitchen Wash your hands often with soap and warm water for at least 20 seconds.  If soap and water are not readily available, use an alcohol-based hand sanitizer with at least 60% alcohol.  . If coughing or sneezing, cover your mouth and nose by coughing or sneezing into the elbow areas of your shirt or coat, into a tissue or into your sleeve (not your hands). Drinda Butts A MASK when in public  . Avoid shaking hands with others and consider head nods or verbal greetings only. . Avoid touching your eyes, nose, or mouth with unwashed hands.  . Avoid close contact with people who are sick. . Avoid places or events with large numbers of people in one location, like concerts or sporting events. . If you have some symptoms but not all symptoms, continue to monitor at home and seek medical attention if your symptoms worsen. . If you are having a medical emergency, call 911.   ADDITIONAL HEALTHCARE OPTIONS FOR PATIENTS  [facility] / e-Visit: [email or web address]         [facility]: [phone]  [facility]: [phone]                   [facility]: [phone]     It is flu season:   >>> Best ways to protect herself from the flu: Receive the yearly flu vaccine, practice good hand hygiene washing with soap and also using hand sanitizer when available, eat a nutritious meals, get adequate rest, hydrate appropriately   Please contact the office if your symptoms worsen or you have concerns that you are not improving.   Thank you for choosing [facility] for your healthcare, and for allowing [place] to partner with you on your healthcare journey. I am thankful to be able to provide care to you today.   Elisha Headland FNP-C

## 2019-08-03 LAB — NOVEL CORONAVIRUS, NAA: SARS-CoV-2, NAA: NOT DETECTED

## 2019-08-07 ENCOUNTER — Other Ambulatory Visit: Payer: Self-pay | Admitting: Psychiatry

## 2019-08-07 DIAGNOSIS — F4001 Agoraphobia with panic disorder: Secondary | ICD-10-CM

## 2019-08-07 DIAGNOSIS — F325 Major depressive disorder, single episode, in full remission: Secondary | ICD-10-CM

## 2019-08-13 ENCOUNTER — Other Ambulatory Visit: Payer: Self-pay | Admitting: *Deleted

## 2019-08-13 MED ORDER — BUDESONIDE-FORMOTEROL FUMARATE 160-4.5 MCG/ACT IN AERO: 2.0000 | INHALATION_SPRAY | Freq: Two times a day (BID) | RESPIRATORY_TRACT | 5 refills | Status: DC

## 2019-08-14 ENCOUNTER — Other Ambulatory Visit: Payer: Self-pay | Admitting: Emergency Medicine

## 2019-08-14 MED ORDER — BUDESONIDE-FORMOTEROL FUMARATE 160-4.5 MCG/ACT IN AERO: 2.0000 | INHALATION_SPRAY | Freq: Two times a day (BID) | RESPIRATORY_TRACT | 5 refills | Status: DC

## 2019-08-19 ENCOUNTER — Ambulatory Visit (INDEPENDENT_AMBULATORY_CARE_PROVIDER_SITE_OTHER): Payer: 59 | Admitting: Neurology

## 2019-08-19 ENCOUNTER — Other Ambulatory Visit: Payer: Self-pay

## 2019-08-19 ENCOUNTER — Encounter: Payer: Self-pay | Admitting: Neurology

## 2019-08-19 VITALS — BP 136/94 | HR 74 | Ht 64.0 in | Wt 296.0 lb

## 2019-08-19 DIAGNOSIS — M5441 Lumbago with sciatica, right side: Secondary | ICD-10-CM

## 2019-08-19 DIAGNOSIS — G8929 Other chronic pain: Secondary | ICD-10-CM

## 2019-08-19 DIAGNOSIS — M5442 Lumbago with sciatica, left side: Secondary | ICD-10-CM

## 2019-08-19 NOTE — Patient Instructions (Signed)
Please follow-up with Dr. Norville Haggard and [facility]'s office for ongoing low back pain

## 2019-08-19 NOTE — Progress Notes (Signed)
[name] Clinic Note - Initial Visit   Date: 08/19/19  [name] MRN: [id number] DOB: [birth year]   Dear Dr. Hyman Hopes:  Thank you for your kind referral of Carolyn Castro for consultation of bilateral leg pain. Although her history is well known to you, please allow [place] to reiterate it for the purpose of our medical record. The patient was accompanied to the clinic by self.    History of Present Illness: Carolyn Castro is a 39 y.o. female with hypertension, asthma, hypothyroidism, lumbar spondylolisthesis at L5-S1 s/p surgery (Dr. Conchita Paris 12/2018), GERD, panic disorder, depression, and morbid obesity presenting for evaluation of ongoing low back and leg pain.   For the past 5-7 years, she reports intermittent spells of chronic low back pain, she has received ESI for pain in the past.  However, in March 2020, she suffered a fall and had severe low back pain radiating into both legs which prompted imaging of her lumbar spine which showed grade 2 L5-S1 spondylolisthesis.  She underwent surgery by Dr. Conchita Paris and reports having improved localized pain in the back, but there was no improvement in the severity of leg pain.She complains of achy/ pain from her waist into her feet.  She has pain-limiting weakness in the legs,.  She normally walks unassisted, but when pain is severe she uses a cane about 3-4 times per week and wheelchair.  She has not done physical therapy.  When pain is less, she does not have weakness.   She denies similar symptoms in the arms, or numbness/tingling in the legs.   Out-side paper records, electronic medical record, and images have been reviewed where available and summarized as:  MRI thoracic and lumbar spine 12/26/2018: 1. Substantially increased lumbar epidural lipomatosis since March at L4-L5 and L5-S1 levels resulting in new spinal stenosis. 2. Otherwise stable MRI appearance of the lumbar spine since 09/04/2018: Chronic  L5 pars fractures with grade 2 spondylolisthesis and severe bilateral foraminal stenosis at L5-S1. 3. The thoracic spine is negative except for intermittent facet arthropathy not resulting in stenosis. No thoracic spinal cord abnormality identified.  MRI lumbar spine 09/04/2018: IMPRESSION: 1. Grade 2 L5-S1 anterolisthesis on the basis of chronic bilateral L5 pars interarticularis defects. No acute osseous process. 2. No canal stenosis.  Severe L5-S1 neural foraminal narrowing.  Lab Results  Component Value Date   HGBA1C 5.6 01/03/2019   Lab Results  Component Value Date   VITAMINB12 129 (L) 01/03/2019   Lab Results  Component Value Date   TSH 1.100 01/03/2019   No results found for: ESRSEDRATE, POCTSEDRATE  Past Medical History:  Diagnosis Date  . Anxiety   . Asthma   . Back pain   . Constipation   . Fatigue   . Hypertension   . Hypothyroid   . Hypothyroidism     Past Surgical History:  Procedure Laterality Date  . BACK SURGERY    . COLONOSCOPY    . ESOPHAGOGASTRODUODENOSCOPY (EGD) WITH PROPOFOL N/A 01/28/2019   Procedure: ESOPHAGOGASTRODUODENOSCOPY (EGD) WITH PROPOFOL;  Surgeon: Carman Ching, MD;  Location: [facility];  Service: Endoscopy;  Laterality: N/A;  . LAPAROSCOPIC OVARIAN CYSTECTOMY Left 06/29/2018   Procedure: LAPAROSCOPIC OVARIAN CYSTECTOMY;  Surgeon: Gerald Leitz, MD;  Location: [facility];  Service: Gynecology;  Laterality: Left;  . TONSILLECTOMY    . WISDOM TOOTH EXTRACTION       Medications:  Outpatient Encounter Medications as of 08/19/2019  Medication Sig  . acetaminophen (TYLENOL) 500 MG tablet  Take 1,000 mg by mouth every 6 (six) hours as needed (for pain).   Marland Kitchen albuterol (VENTOLIN HFA) 108 (90 Base) MCG/ACT inhaler Inhale 2 puffs into the lungs every 6 (six) hours as needed for wheezing or shortness of breath.  . ALPRAZolam (XANAX) 0.5 MG tablet Take 1 tablet (0.5 mg total) by mouth 3 (three) times daily as needed for anxiety.  (Patient taking differently: Take 0.5 mg by mouth 2 (two) times daily as needed for anxiety. )  . amLODipine (NORVASC) 10 MG tablet Take 10 mg by mouth daily.  . budesonide-formoterol (SYMBICORT) 160-4.5 MCG/ACT inhaler Inhale 2 puffs into the lungs 2 (two) times daily.  . cholecalciferol (VITAMIN D3) 25 MCG (1000 UT) tablet Take 1,000 Units by mouth daily.  Marland Kitchen CRANBERRY PO Take 1 tablet by mouth 2 (two) times daily.   . cyclobenzaprine (FLEXERIL) 10 MG tablet TK 1 T PO QD HS PRN  . diphenhydrAMINE (BENADRYL) 25 MG tablet Take 50 mg by mouth as needed for itching or allergies.   Tery Sanfilippo Sodium (STOOL SOFTENER) 100 MG capsule Take 100 mg by mouth 2 (two) times daily.  . ferrous sulfate 325 (65 FE) MG tablet Take 1 tablet (325 mg total) by mouth 2 (two) times daily with a meal.  . fexofenadine (ALLEGRA) 180 MG tablet Take 180 mg by mouth daily.  . furosemide (LASIX) 40 MG tablet Take 40 mg by mouth daily.  Marland Kitchen gabapentin (NEURONTIN) 300 MG capsule Take 300 mg by mouth 3 (three) times daily as needed.  Marland Kitchen ibuprofen (ADVIL,MOTRIN) 200 MG tablet Take 800 mg by mouth every 6 (six) hours as needed (for pain).   Marland Kitchen ipratropium (ATROVENT) 0.02 % nebulizer solution Take 2.5 mLs (0.5 mg total) by nebulization every 6 (six) hours as needed for wheezing or shortness of breath.  Marland Kitchen ipratropium-albuterol (DUONEB) 0.5-2.5 (3) MG/3ML SOLN Take 3 mLs by nebulization every 6 (six) hours as needed.  Marland Kitchen levothyroxine (SYNTHROID, LEVOTHROID) 75 MCG tablet Take 75 mcg by mouth daily before breakfast.  . meloxicam (MOBIC) 15 MG tablet Take 1 tablet (15 mg total) by mouth daily.  . mometasone (NASONEX) 50 MCG/ACT nasal spray Place 2 sprays into the nose daily.  . montelukast (SINGULAIR) 10 MG tablet Take 10 mg by mouth every evening.   . Multiple Vitamins-Minerals (WOMENS MULTI PO) Take by mouth.  . norethindrone-ethinyl estradiol (MICROGESTIN,JUNEL,LOESTRIN) 1-20 MG-MCG tablet Take 1 tablet by mouth daily.  Marland Kitchen omeprazole  (PRILOSEC OTC) 20 MG tablet Take 20 mg by mouth 2 (two) times daily.   . ondansetron (ZOFRAN-ODT) 4 MG disintegrating tablet DIS 1 T ON THE TONGUE QD PRF NAUSEA  . PARoxetine (PAXIL) 30 MG tablet TAKE 2 TABLETS(60 MG) BY MOUTH DAILY  . predniSONE (DELTASONE) 20 MG tablet Take 40 mg by mouth daily.  . Probiotic Product (PROBIOTIC DAILY PO) Take 1 tablet by mouth daily.  . traZODone (DESYREL) 50 MG tablet Take 2 tablets (100 mg total) by mouth at bedtime as needed for sleep.  Marland Kitchen ALPRAZolam (XANAX) 1 MG tablet TK 1/2 T PO BID PRN  . benzonatate (TESSALON) 100 MG capsule Take 1 capsule (100 mg total) by mouth 2 (two) times daily as needed for cough. (Patient not taking: Reported on 08/19/2019)  . omeprazole (PRILOSEC) 20 MG capsule Take 20 mg by mouth 2 (two) times daily.   No facility-administered encounter medications on file as of 08/19/2019.    Allergies:  Allergies  Allergen Reactions  . Ciprofloxacin Hcl Diarrhea and Other (See Comments)  Developed C-Diff    Family History: Family History  Problem Relation Age of Onset  . Hypertension Mother   . Hypertension Father     Social History: Social History   Tobacco Use  . Smoking status: Never Smoker  . Smokeless tobacco: Never Used  Substance Use Topics  . Alcohol use: Never  . Drug use: Never   Social History   Social History Narrative   Left handed   One story home   Drinks caffeine    Vital Signs:  BP (!) 136/94   Pulse 74   Ht 5\' 4"  (1.626 m)   Wt 296 lb (134.3 kg)   SpO2 99%   BMI 50.81 kg/m   Neurological Exam: MENTAL STATUS including orientation to time, place, person, recent and remote memory, attention span and concentration, language, and fund of knowledge is normal.  Speech is not dysarthric.  CRANIAL NERVES: II:  No visual field defects.    III-IV-VI: Pupils equal round.  Normal conjugate, extra-ocular eye movements in all directions of gaze.  No nystagmus.  No ptosis.   V:  Normal facial sensation.     VII:  Normal facial symmetry and movements.   VIII:  Normal hearing and vestibular function.   IX-X:  Normal palatal movement.   XI:  Normal shoulder shrug and head rotation.   XII:  Normal tongue strength and range of motion, no deviation or fasciculation.  MOTOR:  No atrophy, fasciculations or abnormal movements.  No pronator drift.   Upper Extremity:  Right  Left  Deltoid  5/5   5/5   Biceps  5/5   5/5   Triceps  5/5   5/5   Infraspinatus 5/5  5/5  Medial pectoralis 5/5  5/5  Wrist extensors  5/5   5/5   Wrist flexors  5/5   5/5   Finger extensors  5/5   5/5   Finger flexors  5/5   5/5   Dorsal interossei  5/5   5/5   Abductor pollicis  5/5   5/5   Tone (Ashworth scale)  0  0   Lower Extremity:  Right  Left  Hip flexors  5/5   5/5   Hip extensors  5/5   5/5   Adductor 5/5  5/5  Abductor 5/5  5/5  Knee flexors  5/5   5/5   Knee extensors  5/5   5/5   Dorsiflexors  5/5   5/5   Plantarflexors  5/5   5/5   Toe extensors  5/5   5/5   Toe flexors  5/5   5/5   Tone (Ashworth scale)  0  0   MSRs:  Right        Left                  brachioradialis 2+  2+  biceps 2+  2+  triceps 2+  2+  patellar 3+  3+  ankle jerk 2+  2+  Hoffman no  no  plantar response down  down   SENSORY:  Normal and symmetric perception of light touch, pinprick, vibration, and proprioception.   COORDINATION/GAIT: Normal finger-to- nose-finger.   Intact rapid alternating movements bilaterally.   Gait wide-based due to body habitus, stable, unassisted.   IMPRESSION: Chronic low back pain with radicular leg pain due to lumbar spondylosis. Patient reassured that I do not see any other neurodegenerative condition causing her pain, such as multiple sclerosis, myopathy, or neuropathy.  Her  exam today is remarkably intact with full motor strength, intact sensation, and she is walking unassisted.  Her weakness that she describes is pain-limited.  Therefore, I recommend that she follow-up with Dr.  Mardene Celeste for ongoing pain management, as this seems to be her primary complaint.  I suggested physical therapy for low back strengthening, and she may consider this based on her recommendations from neurosurgery.    Thank you for allowing me to participate in patient's care.  If I can answer any additional questions, I would be pleased to do so.    Sincerely,    Anastaisa Wooding K. Posey Pronto, DO

## 2019-08-23 ENCOUNTER — Ambulatory Visit: Payer: 59 | Admitting: Primary Care

## 2019-08-26 ENCOUNTER — Emergency Department (HOSPITAL_COMMUNITY): Payer: 59

## 2019-08-26 ENCOUNTER — Other Ambulatory Visit: Payer: Self-pay

## 2019-08-26 ENCOUNTER — Emergency Department (HOSPITAL_COMMUNITY)
Admission: EM | Admit: 2019-08-26 | Discharge: 2019-08-26 | Disposition: A | Discharge status: Home / Self Care | Payer: 59 | Source: Home / Self Care | Attending: Emergency Medicine | Admitting: Emergency Medicine

## 2019-08-26 ENCOUNTER — Other Ambulatory Visit (HOSPITAL_COMMUNITY): Payer: Self-pay | Admitting: Neurosurgery

## 2019-08-26 ENCOUNTER — Encounter (HOSPITAL_COMMUNITY): Payer: Self-pay | Admitting: Emergency Medicine

## 2019-08-26 DIAGNOSIS — Y929 Unspecified place or not applicable: Secondary | ICD-10-CM | POA: Insufficient documentation

## 2019-08-26 DIAGNOSIS — R42 Dizziness and giddiness: Secondary | ICD-10-CM

## 2019-08-26 DIAGNOSIS — Y999 Unspecified external cause status: Secondary | ICD-10-CM | POA: Insufficient documentation

## 2019-08-26 DIAGNOSIS — R112 Nausea with vomiting, unspecified: Secondary | ICD-10-CM | POA: Insufficient documentation

## 2019-08-26 DIAGNOSIS — M545 Low back pain, unspecified: Secondary | ICD-10-CM

## 2019-08-26 DIAGNOSIS — R55 Syncope and collapse: Secondary | ICD-10-CM

## 2019-08-26 DIAGNOSIS — M542 Cervicalgia: Secondary | ICD-10-CM | POA: Insufficient documentation

## 2019-08-26 DIAGNOSIS — Z79899 Other long term (current) drug therapy: Secondary | ICD-10-CM | POA: Insufficient documentation

## 2019-08-26 DIAGNOSIS — I10 Essential (primary) hypertension: Secondary | ICD-10-CM | POA: Insufficient documentation

## 2019-08-26 DIAGNOSIS — Y9389 Activity, other specified: Secondary | ICD-10-CM | POA: Insufficient documentation

## 2019-08-26 DIAGNOSIS — W1839XA Other fall on same level, initial encounter: Secondary | ICD-10-CM | POA: Insufficient documentation

## 2019-08-26 DIAGNOSIS — M48062 Spinal stenosis, lumbar region with neurogenic claudication: Secondary | ICD-10-CM

## 2019-08-26 LAB — CBC WITH DIFFERENTIAL/PLATELET
Abs Immature Granulocytes: 0.04 10*3/uL (ref 0.00–0.07)
Basophils Absolute: 0 10*3/uL (ref 0.0–0.1)
Basophils Relative: 1 %
Eosinophils Absolute: 0.1 10*3/uL (ref 0.0–0.5)
Eosinophils Relative: 1 %
HCT: 40.8 % (ref 36.0–46.0)
Hemoglobin: 13.1 g/dL (ref 12.0–15.0)
Immature Granulocytes: 1 %
Lymphocytes Relative: 28 %
Lymphs Abs: 2.5 10*3/uL (ref 0.7–4.0)
MCH: 29.3 pg (ref 26.0–34.0)
MCHC: 32.1 g/dL (ref 30.0–36.0)
MCV: 91.3 fL (ref 80.0–100.0)
Monocytes Absolute: 0.7 10*3/uL (ref 0.1–1.0)
Monocytes Relative: 8 %
Neutro Abs: 5.5 10*3/uL (ref 1.7–7.7)
Neutrophils Relative %: 61 %
Platelets: 311 10*3/uL (ref 150–400)
RBC: 4.47 MIL/uL (ref 3.87–5.11)
RDW: 13.3 % (ref 11.5–15.5)
WBC: 8.9 10*3/uL (ref 4.0–10.5)
nRBC: 0 % (ref 0.0–0.2)

## 2019-08-26 LAB — COMPREHENSIVE METABOLIC PANEL
ALT: 27 U/L (ref 0–44)
AST: 24 U/L (ref 15–41)
Albumin: 3.1 g/dL — ABNORMAL LOW (ref 3.5–5.0)
Alkaline Phosphatase: 60 U/L (ref 38–126)
Anion gap: 12 (ref 5–15)
BUN: 13 mg/dL (ref 6–20)
CO2: 20 mmol/L — ABNORMAL LOW (ref 22–32)
Calcium: 8.4 mg/dL — ABNORMAL LOW (ref 8.9–10.3)
Chloride: 106 mmol/L (ref 98–111)
Creatinine, Ser: 0.95 mg/dL (ref 0.44–1.00)
GFR calc Af Amer: >60 mL/min (ref >60)
GFR calc non Af Amer: >60 mL/min (ref >60)
Glucose, Bld: 96 mg/dL (ref 70–99)
Potassium: 3.9 mmol/L (ref 3.5–5.1)
Sodium: 138 mmol/L (ref 135–145)
Total Bilirubin: 0.4 mg/dL (ref 0.3–1.2)
Total Protein: 6.2 g/dL — ABNORMAL LOW (ref 6.5–8.1)

## 2019-08-26 LAB — I-STAT BETA HCG BLOOD, ED (MC, WL, AP ONLY): I-stat hCG, quantitative: <5 m[IU]/mL (ref <5)

## 2019-08-26 MED ORDER — CYCLOBENZAPRINE HCL 10 MG PO TABS: 10.0000 mg | ORAL_TABLET | Freq: Two times a day (BID) | ORAL | 0 refills | Status: DC | PRN

## 2019-08-26 MED ORDER — KETOROLAC TROMETHAMINE 30 MG/ML IJ SOLN
30.0000 mg | Freq: Once | INTRAMUSCULAR | Status: AC
  Administered 2019-08-26: 30 mg via INTRAVENOUS
  Filled 2019-08-26: qty 1

## 2019-08-26 MED ORDER — IBUPROFEN 600 MG PO TABS: 600.0000 mg | ORAL_TABLET | Freq: Four times a day (QID) | ORAL | 0 refills | Status: DC | PRN

## 2019-08-26 MED ORDER — MELOXICAM 15 MG PO TABS: 15.0000 mg | ORAL_TABLET | Freq: Every day | ORAL | 1 refills | Status: DC

## 2019-08-26 MED ORDER — CYCLOBENZAPRINE HCL 10 MG PO TABS
10.0000 mg | ORAL_TABLET | Freq: Once | ORAL | Status: AC
  Administered 2019-08-26: 22:00:00 10 mg via ORAL
  Filled 2019-08-26: qty 1

## 2019-08-26 MED ORDER — ONDANSETRON HCL 4 MG/2ML IJ SOLN
4.0000 mg | Freq: Once | INTRAMUSCULAR | Status: AC
  Administered 2019-08-26: 20:00:00 4 mg via INTRAVENOUS
  Filled 2019-08-26: qty 2

## 2019-08-26 MED ORDER — HYDROMORPHONE HCL 1 MG/ML IJ SOLN
1.0000 mg | Freq: Once | INTRAMUSCULAR | Status: AC
  Administered 2019-08-26: 20:00:00 1 mg via INTRAVENOUS
  Filled 2019-08-26: qty 1

## 2019-08-26 MED ORDER — HYDROCODONE-ACETAMINOPHEN 5-325 MG PO TABS
1.0000 | ORAL_TABLET | Freq: Once | ORAL | Status: AC
  Administered 2019-08-26: 23:00:00 1 via ORAL
  Filled 2019-08-26: qty 1

## 2019-08-26 MED ORDER — SODIUM CHLORIDE 0.9 % IV BOLUS
1000.0000 mL | Freq: Once | INTRAVENOUS | Status: AC
  Administered 2019-08-26: 20:00:00 1000 mL via INTRAVENOUS

## 2019-08-26 MED ORDER — SODIUM CHLORIDE 0.9 % IV BOLUS (SEPSIS)
1000.0000 mL | Freq: Once | INTRAVENOUS | Status: AC
  Administered 2019-08-26: 22:00:00 1000 mL via INTRAVENOUS

## 2019-08-26 NOTE — Telephone Encounter (Signed)
Rx for Meloxicam 15mg  sent to [facility]

## 2019-08-26 NOTE — ED Provider Notes (Signed)
[facility] EMERGENCY [facility] Provider Note   CSN: [id number] Arrival date & time: 08/26/19  1734     History Chief Complaint  Patient presents with  . Loss of Consciousness    Carolyn Castro is a 39 y.o. female with history of anxiety, asthma, fatigue, hypertension, chronic back pain, GERD, obesity, lumbar stenosis with neurogenic claudication presents for evaluation of acute onset, persistent headache, back pain secondary to fall around 3 PM.  She reports that for the last 4 days she has felt generalized body aches, fevers at home, generalized weakness.  She went to [facility] days ago and reports she tested negative for Covid but tested positive for influenza.  She states she has been trying to stay hydrated but has had decreased appetite and decreased oral intake.  Earlier today she was walking to the bathroom with her walker when she began to feel lightheaded and reports that she lost consciousness, landing on her back and struck the back of her head on the hardwood floor.  She is unsure how long she had lost consciousness but when she regained consciousness she reports throbbing pain to the back of her head radiating all the way down the spine into the low back and sacral region.  She had a few episodes of nonbloody nonbilious emesis after regaining consciousness.  Reports feeling as though her vision is blurred bilaterally.  She reports pain radiates down the bilateral lower extremities and reports feeling "completely numb "from the knees down to the toes.  Pain worsens with movement.  She denies bowel or bladder incontinence or saddle anesthesia.  She has a history of chronic back pain and has had a prior lumbar fusion surgery and sees Dr. Conchita ParisNundkumar for reevaluation.  In fact he had a visit with him earlier today and he is planning on obtaining an outpatient MRI of the lumbar spine.  The history is provided by the patient.       Past Medical History:    Diagnosis Date  . Anxiety   . Asthma   . Back pain   . Constipation   . Fatigue   . Hypertension   . Hypothyroid   . Hypothyroidism     Patient Active Problem List   Diagnosis Date Noted  . Suspected COVID-19 virus infection 08/02/2019  . Shortness of breath 05/09/2019  . Acute recurrent frontal sinusitis 05/09/2019  . Allergic rhinitis 02/20/2019  . Normocytic anemia 01/02/2019  . Hyperglycemia 01/02/2019  . GERD (gastroesophageal reflux disease) 01/02/2019  . Acute paraplegia (HCC) 12/26/2018  . Lumbar stenosis with neurogenic claudication 12/26/2018  . Obesity, Class III, BMI 40-49.9 (morbid obesity) (HCC) 12/26/2018  . Essential hypertension 12/26/2018  . Near syncope 12/26/2018  . Lumbar stenosis 12/26/2018  . Asthma 12/25/2018  . Snoring 12/25/2018  . Low back pain 09/04/2018  . Hypothyroidism 09/04/2018  . Infected incision 07/04/2018  . Adnexal mass 06/29/2018  . Pelvic pain 06/29/2018    Past Surgical History:  Procedure Laterality Date  . BACK SURGERY    . COLONOSCOPY    . ESOPHAGOGASTRODUODENOSCOPY (EGD) WITH PROPOFOL N/A 01/28/2019   Procedure: ESOPHAGOGASTRODUODENOSCOPY (EGD) WITH PROPOFOL;  Surgeon: Carman ChingEdwards, James, MD;  Location: [facility];  Service: Endoscopy;  Laterality: N/A;  . LAPAROSCOPIC OVARIAN CYSTECTOMY Left 06/29/2018   Procedure: LAPAROSCOPIC OVARIAN CYSTECTOMY;  Surgeon: Gerald Leitzole, Tara, MD;  Location: [facility];  Service: Gynecology;  Laterality: Left;  . TONSILLECTOMY    . WISDOM TOOTH EXTRACTION  OB History    Gravida  0   Para  0   Term  0   Preterm  0   AB  0   Living  0     SAB  0   TAB  0   Ectopic  0   Multiple  0   Live Births  0           Family History  Problem Relation Age of Onset  . Hypertension Mother   . Hypertension Father     Social History   Tobacco Use  . Smoking status: Never Smoker  . Smokeless tobacco: Never Used  Substance Use Topics  . Alcohol use: Never  .  Drug use: Never    Home Medications Prior to Admission medications   Medication Sig Start Date End Date Taking? Authorizing Provider  acetaminophen (TYLENOL) 500 MG tablet Take 1,000 mg by mouth every 6 (six) hours as needed (for pain).    Yes [provider]  albuterol (VENTOLIN HFA) 108 (90 Base) MCG/ACT inhaler Inhale 2 puffs into the lungs every 6 (six) hours as needed for wheezing or shortness of breath. 03/31/19  Yes Hawks, Christy A, FNP  ALPRAZolam Prudy Feeler) 1 MG tablet Take 0.5 mg by mouth 2 (two) times daily as needed for anxiety.  02/18/19  Yes [provider]  amLODipine (NORVASC) 10 MG tablet Take 10 mg by mouth daily.   Yes [provider]  budesonide-formoterol (SYMBICORT) 160-4.5 MCG/ACT inhaler Inhale 2 puffs into the lungs 2 (two) times daily. 08/14/19  Yes Leslye Peer, MD  cholecalciferol (VITAMIN D3) 25 MCG (1000 UT) tablet Take 1,000 Units by mouth daily.   Yes [provider]  CRANBERRY PO Take 1 tablet by mouth 2 (two) times daily.    Yes [provider]  diphenhydrAMINE (BENADRYL) 25 MG tablet Take 50 mg by mouth as needed for itching or allergies.    Yes [provider]  Docusate Sodium (STOOL SOFTENER) 100 MG capsule Take 100 mg by mouth 2 (two) times daily.   Yes [provider]  fexofenadine (ALLEGRA) 180 MG tablet Take 180 mg by mouth daily.   Yes [provider]  furosemide (LASIX) 40 MG tablet Take 40 mg by mouth daily. 01/14/19  Yes [provider]  gabapentin (NEURONTIN) 300 MG capsule Take 300 mg by mouth 3 (three) times daily as needed (pain).  04/17/19  Yes [provider]  ipratropium-albuterol (DUONEB) 0.5-2.5 (3) MG/3ML SOLN Take 3 mLs by nebulization every 6 (six) hours as needed. 04/26/19  Yes [provider]  levothyroxine (SYNTHROID, LEVOTHROID) 75 MCG tablet Take 75 mcg by mouth daily before breakfast.   Yes [provider]  meloxicam (MOBIC) 15 MG  tablet Take 1 tablet (15 mg total) by mouth daily. Patient taking differently: Take 15 mg by mouth daily as needed for pain.  08/26/19  Yes Felecia Shelling, DPM  mometasone (NASONEX) 50 MCG/ACT nasal spray Place 2 sprays into the nose daily.   Yes [provider]  montelukast (SINGULAIR) 10 MG tablet Take 10 mg by mouth every evening.    Yes [provider]  Multiple Vitamins-Minerals (WOMENS MULTI PO) Take 1 tablet by mouth daily.    Yes [provider]  norethindrone-ethinyl estradiol (MICROGESTIN,JUNEL,LOESTRIN) 1-20 MG-MCG tablet Take 1 tablet by mouth daily.   Yes [provider]  omeprazole (PRILOSEC) 20 MG capsule Take 20 mg by mouth 2 (two) times daily. 07/21/19  Yes [provider]  ondansetron (ZOFRAN-ODT) 4 MG disintegrating tablet Take 4 mg by mouth daily as needed for nausea.  03/05/19  Yes [provider]  PARoxetine (PAXIL) 30 MG tablet TAKE 2 TABLETS(60 MG) BY MOUTH DAILY Patient taking differently: Take 60 mg by mouth daily.  08/07/19  Yes Cottle, Steva Ready., MD  Probiotic Product (PROBIOTIC DAILY PO) Take 1 tablet by mouth daily.   Yes [provider]  Promethazine-Codeine 6.25-10 MG/5ML SOLN Take 5 mLs by mouth every 6 (six) hours as needed (cough).  08/21/19  Yes [provider]  traZODone (DESYREL) 50 MG tablet Take 2 tablets (100 mg total) by mouth at bedtime as needed for sleep. 06/12/19  Yes Cottle, Steva Ready., MD  ALPRAZolam Prudy Feeler) 0.5 MG tablet Take 1 tablet (0.5 mg total) by mouth 3 (three) times daily as needed for anxiety. Patient not taking: Reported on 08/26/2019 09/27/18   Lauraine Rinne., MD  benzonatate (TESSALON) 100 MG capsule Take 1 capsule (100 mg total) by mouth 2 (two) times daily as needed for cough. Patient not taking: Reported on 08/19/2019 08/02/19   Arvilla Market, DO  cyclobenzaprine (FLEXERIL) 10 MG tablet Take 1 tablet (10 mg total) by mouth 2 (two) times daily as needed for  muscle spasms. 08/26/19   Luevenia Maxin, Shritha Bresee A, PA-C  ferrous sulfate 325 (65 FE) MG tablet Take 1 tablet (325 mg total) by mouth 2 (two) times daily with a meal. Patient not taking: Reported on 08/26/2019 01/04/19   Elease Etienne, MD  ibuprofen (ADVIL) 600 MG tablet Take 1 tablet (600 mg total) by mouth every 6 (six) hours as needed. 08/26/19   Michela Pitcher A, PA-C  ipratropium (ATROVENT) 0.02 % nebulizer solution Take 2.5 mLs (0.5 mg total) by nebulization every 6 (six) hours as needed for wheezing or shortness of breath. Patient not taking: Reported on 08/26/2019 08/02/19   Arvilla Market, DO    Allergies    Ciprofloxacin hcl  Review of Systems   Review of Systems  Constitutional: Positive for chills, fatigue and fever.  Respiratory: Negative for cough and shortness of breath.   Cardiovascular: Negative for chest pain.  Gastrointestinal: Positive for nausea and vomiting. Negative for constipation and diarrhea.  Musculoskeletal: Positive for arthralgias, back pain, myalgias and neck pain.  Neurological: Positive for syncope, weakness, light-headedness, numbness and headaches.  All other systems reviewed and are negative.   Physical Exam Updated Vital Signs BP 136/84 (BP Location: Left Arm)   Pulse (!) 105   Temp 98.2 F (36.8 C) (Oral)   Resp 18   Ht 5\' 4"  (1.626 m)   Wt 122.5 kg   SpO2 95%   BMI 46.35 kg/m   Physical Exam Vitals and nursing note reviewed.  Constitutional:      General: She is not in acute distress.    Appearance: She is well-developed. She is obese.  HENT:     Head: Normocephalic and atraumatic.  Eyes:     General:        Right eye: No discharge.        Left eye: No discharge.     Conjunctiva/sclera: Conjunctivae normal.  Neck:     Vascular: No JVD.     Trachea: No tracheal deviation.     Comments: Diffuse midline spine tenderness with bilateral paracervical muscle tenderness.  No deformity, crepitus, or step Cardiovascular:     Rate and Rhythm:  Normal rate and regular rhythm.  Pulmonary:  Effort: Pulmonary effort is normal.     Breath sounds: Normal breath sounds.  Abdominal:     General: Bowel sounds are normal. There is no distension.     Palpations: Abdomen is soft.     Tenderness: There is no guarding or rebound.  Musculoskeletal:        General: Tenderness present.     Cervical back: Neck supple.     Comments: Diffuse midline spine tenderness with bilateral paraspinal muscle tenderness.  4+/5 strength of BUE major muscle groups, reports her pain is making it difficult for her to move.  4+/5 strength of bilateral hip flexors.  Remainder of examination of lower extremity major muscle groups is limited secondary to pain  Skin:    General: Skin is warm and dry.     Findings: No erythema.  Neurological:     Mental Status: She is alert.     Comments: Fluent speech with no evidence of dysarthria or aphasia.  No facial droop.  Cranial nerves II through XII tested and intact.  Sensation intact to light touch of bilateral upper extremities.  Reports decreased sensation to soft touch of the bilateral lower extremities.  Psychiatric:        Behavior: Behavior normal.     ED Results / Procedures / Treatments   Labs (all labs ordered are listed, but only abnormal results are displayed) Labs Reviewed  COMPREHENSIVE METABOLIC PANEL - Abnormal; Notable for the following components:      Result Value   CO2 20 (*)    Calcium 8.4 (*)    Total Protein 6.2 (*)    Albumin 3.1 (*)    All other components within normal limits  CBC WITH DIFFERENTIAL/PLATELET  I-STAT BETA HCG BLOOD, ED ([facility], [facility], [facility] ONLY)    EKG EKG Interpretation  Date/Time:  Monday August 26 2019 17:33:12 EST Ventricular Rate:  101 PR Interval:  134 QRS Duration: 86 QT Interval:  352 QTC Calculation: 456 R Axis:   -22 Text Interpretation: Sinus tachycardia Otherwise normal ECG Since last tracing rate faster Confirmed by Dorie Rank [phone]) on 08/26/2019 6:09:30  PM Also confirmed by Dorie Rank [phone]), editor Hattie Perch ([phone])  on 08/27/2019 1:50:27 PM   Radiology CT Head Wo Contrast  Result Date: 08/26/2019 CLINICAL DATA:  Syncope, weakness, headache EXAM: CT HEAD WITHOUT CONTRAST TECHNIQUE: Contiguous axial images were obtained from the base of the skull through the vertex without intravenous contrast. COMPARISON:  12/26/2018 FINDINGS: Brain: No acute infarct or hemorrhage. Lateral ventricles and midline structures are unremarkable. No acute extra-axial fluid collections. No mass effect. Vascular: No hyperdense vessel or unexpected calcification. Skull: Normal. Negative for fracture or focal lesion. Sinuses/Orbits: No acute finding. Other: None IMPRESSION: 1. Stable head CT, no acute process. Electronically Signed   By: Randa Ngo M.D.   On: 08/26/2019 21:04   CT Cervical Spine Wo Contrast  Result Date: 08/26/2019 CLINICAL DATA:  Syncope, fell, diffuse pain EXAM: CT CERVICAL and LUMBAR SPINE WITHOUT CONTRAST TECHNIQUE: Multidetector CT imaging of the cervical and lumbar spine was performed without intravenous contrast. Multiplanar CT image reconstructions were also generated. COMPARISON:  09/04/2018 FINDINGS: CT CERVICAL SPINE FINDINGS Alignment: Alignment is anatomic. Skull base and vertebrae: No acute displaced fractures. Soft tissues and spinal canal: No prevertebral fluid or swelling. No visible canal hematoma. Disc levels: Disc spaces are well preserved. No significant central canal or neural foraminal encroachment. Upper chest: Airway is patent. Visualized portions of the lung apices are clear. Reconstructed images demonstrate  no additional findings. CT LUMBAR SPINE FINDINGS Segmentation: There are 6 non-rib-bearing lumbar type vertebral bodies. Alignment: There is been and discectomy at L6/S1 with posterior fusion at that level. Persistent grade 1 anterolisthesis of L6 relative to S1. Vertebrae: No acute displaced fractures. Paraspinal and  other soft tissues: Negative. Disc levels: Aside from the postsurgical changes at L6/S1, the remaining intervertebral disc spaces are unremarkable. IMPRESSION: 1. No acute displaced cervical or lumbar spine fracture. 2. There are 6 non-rib-bearing lumbar type vertebral bodies. 3. Postsurgical changes at L6/S1 with posterior fusion at that level. Persistent grade 1 anterolisthesis of L6 relative to S1. Electronically Signed   By: Sharlet Salina M.D.   On: 08/26/2019 21:11   CT Lumbar Spine Wo Contrast  Result Date: 08/26/2019 CLINICAL DATA:  Syncope, fell, diffuse pain EXAM: CT CERVICAL and LUMBAR SPINE WITHOUT CONTRAST TECHNIQUE: Multidetector CT imaging of the cervical and lumbar spine was performed without intravenous contrast. Multiplanar CT image reconstructions were also generated. COMPARISON:  09/04/2018 FINDINGS: CT CERVICAL SPINE FINDINGS Alignment: Alignment is anatomic. Skull base and vertebrae: No acute displaced fractures. Soft tissues and spinal canal: No prevertebral fluid or swelling. No visible canal hematoma. Disc levels: Disc spaces are well preserved. No significant central canal or neural foraminal encroachment. Upper chest: Airway is patent. Visualized portions of the lung apices are clear. Reconstructed images demonstrate no additional findings. CT LUMBAR SPINE FINDINGS Segmentation: There are 6 non-rib-bearing lumbar type vertebral bodies. Alignment: There is been and discectomy at L6/S1 with posterior fusion at that level. Persistent grade 1 anterolisthesis of L6 relative to S1. Vertebrae: No acute displaced fractures. Paraspinal and other soft tissues: Negative. Disc levels: Aside from the postsurgical changes at L6/S1, the remaining intervertebral disc spaces are unremarkable. IMPRESSION: 1. No acute displaced cervical or lumbar spine fracture. 2. There are 6 non-rib-bearing lumbar type vertebral bodies. 3. Postsurgical changes at L6/S1 with posterior fusion at that level. Persistent  grade 1 anterolisthesis of L6 relative to S1. Electronically Signed   By: Sharlet Salina M.D.   On: 08/26/2019 21:11   MR LUMBAR SPINE WO CONTRAST  Result Date: 08/27/2019 CLINICAL DATA:  Chronic back pain, weakness EXAM: MRI LUMBAR SPINE WITHOUT CONTRAST TECHNIQUE: Multiplanar, multisequence MR imaging of the lumbar spine was performed. No intravenous contrast was administered. COMPARISON:  CT 08/26/2019, MRI 12/26/2018 FINDINGS: Segmentation: 5 lumbar type vertebral bodies as designated on prior MRI report of 12/26/2018 with the operative level being labeled as L5-S1. Alignment:  Chronic grade 2 anterolisthesis L5 on S1. Vertebrae: Since the previous MRI, there has been posterior and interbody fusion at L5-S1 with posterior decompression. No fracture, evidence of discitis, or bone lesion. Conus medullaris and cauda equina: Conus extends to the T12-L1 level. Conus and cauda equina appear normal. Paraspinal and other soft tissues: Negative. Disc levels: T12-L1: Unremarkable. L1-L2: Unremarkable. L2-L3: Unremarkable. L3-L4: Unremarkable. L4-L5: Unremarkable disc. Mild epidural lipomatosis, which appears slightly less prominent compared to prior. No foraminal or canal stenosis. L5-S1: Interval fusion and posterior decompression. Evaluation of this level is largely degraded by metallic susceptibility artifact. Bilateral foramina appear to be somewhat improved, although are largely obscured. Epidural lipomatosis is partially visualized. IMPRESSION: 1. Interval L5-S1 posterior and interbody fusion with posterior decompression. Evaluation of this level is degraded by metallic susceptibility artifact. Bilateral L5-S1 foramina appear somewhat improved, although are largely obscured by metallic susceptibility artifact. 2. Epidural lipomatosis at L4-5 and L5-S1, which appears slightly less prominent compared to prior. 3. Remaining vertebral levels are unremarkable without foraminal or  canal stenosis. Electronically Signed    By: Duanne Guess D.O.   On: 08/27/2019 13:15   DG Chest Port 1 View  Result Date: 08/27/2019 CLINICAL DATA:  Unresponsive EXAM: PORTABLE CHEST 1 VIEW COMPARISON:  05/21/2019 FINDINGS: Patient rotated minimally left. Midline trachea. Normal heart size for level of inspiration. Mildly degraded exam due to AP portable technique and patient body habitus. No pleural fluid. Low lung volumes with resultant pulmonary interstitial prominence. No lobar consolidation. No overt congestive failure. IMPRESSION: Low lung volumes, without definite acute disease. Electronically Signed   By: Jeronimo Greaves M.D.   On: 08/27/2019 13:51    Procedures Procedures (including critical care time)  Medications Ordered in ED Medications  ondansetron (ZOFRAN) injection 4 mg (4 mg Intravenous Given 08/26/19 1947)  HYDROmorphone (DILAUDID) injection 1 mg (1 mg Intravenous Given 08/26/19 1947)  sodium chloride 0.9 % bolus 1,000 mL (0 mLs Intravenous Stopped 08/26/19 2209)  cyclobenzaprine (FLEXERIL) tablet 10 mg (10 mg Oral Given 08/26/19 2149)  ketorolac (TORADOL) 30 MG/ML injection 30 mg (30 mg Intravenous Given 08/26/19 2149)  sodium chloride 0.9 % bolus 1,000 mL (0 mLs Intravenous Stopped 08/26/19 2311)  HYDROcodone-acetaminophen (NORCO/VICODIN) 5-325 MG per tablet 1 tablet (1 tablet Oral Given 08/26/19 2315)    ED Course  I have reviewed the triage vital signs and the nursing notes.  Pertinent labs & imaging results that were available during my care of the patient were reviewed by me and considered in my medical decision making (see chart for details).    MDM Rules/Calculators/A&P                      Carolyn Castro was evaluated in Emergency Department on 08/27/2019 for the symptoms described in the history of present illness. She was evaluated in the context of the global COVID-19 pandemic, which necessitated consideration that the patient might be at risk for infection with the SARS-CoV-2 virus that causes  COVID-19. Institutional protocols and algorithms that pertain to the evaluation of patients at risk for COVID-19 are in a state of rapid change based on information released by regulatory bodies including the CDC and federal and state organizations. These policies and algorithms were followed during the patient's care in the ED.  Reportedly flu positive presents for evaluation of unwitnessed syncopal episode.  She is afebrile in the ED, mildly tachycardic but reports she has been eating or drinking as much.  No focal neurologic deficits although she describes subjectively decreased sensation to light touch of the bilateral lower extremities.  She has a longstanding history of chronic back pain and sees Dr. Conchita Paris for pain management and neurosurgical follow-up.  She is scheduled for an MRI tomorrow.  Much of her weakness seems pain mediated and she exhibits very little effort on exam.  She has diffuse tenderness to palpation of the spine and paraspinal musculature with no evidence of significant trauma.  No signs of serious head injury.  I suspect that she likely lost consciousness due to her hypovolemia as she became lightheaded with standing and ambulating to the bathroom.  No signs of serious injury to the chest or abdomen/pelvis.  EKG shows sinus tachycardia, no acute ischemic abnormalities.  Lab work reviewed and interpreted by myself shows no leukocytosis, no anemia, no metabolic derangements, no renal insufficiency which is reassuring in the setting of her decreased oral intake due to influenza.  Imaging shows no evidence of acute traumatic injury, known grade 1 anterior listhesis at L6 relative  to S1 is stable.  No evidence of acute intracranial abnormality, skull fracture, or cervical spine injury.  Her pain was managed in the ED and on reevaluation reports she is feeling better.  She was given IV fluids in the ED as well.  She was ambulated by staff in the ED without difficulty.  She uses a  wheelchair and walker at home often.  She feels comfortable with discharge home.  We discussed pushing fluids, anti-inflammatories and Tylenol for management of pain.  Also discussed the utility of muscle relaxers and discussed potential side effects.  She has an appointment scheduled for her MRI tomorrow and will follow up with her pain management specialist/spine surgeon for reevaluation.  Discussed strict ED return precautions. Patient verbalized understanding of and agreement with plan and is safe for discharge home at this time.      Final Clinical Impression(s) / ED Diagnoses Final diagnoses:  Syncope and collapse  Lightheadedness  Acute bilateral low back pain without sciatica    Rx / DC Orders ED Discharge Orders         Ordered    cyclobenzaprine (FLEXERIL) 10 MG tablet  2 times daily PRN     08/26/19 2328    ibuprofen (ADVIL) 600 MG tablet  Every 6 hours PRN     08/26/19 2328           Jeanie SewerFawze, Advith Martine A, PA-C 08/27/19 1524    Linwood DibblesKnapp, Jon, MD 08/27/19 2329

## 2019-08-26 NOTE — Discharge Instructions (Signed)
1. Medications: Alternate 600 mg of ibuprofen and [phone] mg of Tylenol every 3-4 hours as needed for pain. Do not exceed 4000 mg of Tylenol daily.  Take ibuprofen with food to avoid upset stomach issues.  You can take Flexeril as needed for muscle spasm up to twice daily but do not drive, drink alcohol, or operate heavy machinery while taking this medicine because it may make you drowsy.  I typically recommend taking this medicine only at night when you are going to sleep.  You can also cut these tablets in half if they make you feel very drowsy. 2. Treatment: rest, drink plenty of fluids, gentle stretching as discussed (see attached), alternate ice and heat (or stick with whichever feels best) 20 minutes on 20 minutes off. 3. Follow Up: Please followup with your primary doctor or spine surgeon for discussion of your diagnoses and further evaluation after today's visit; if you do not have a primary care doctor use the resource guide provided to find one;  Return to the ER for worsening back pain, difficulty walking, loss of bowel or bladder control or other concerning symptoms

## 2019-08-26 NOTE — ED Triage Notes (Signed)
Pt BIB [facility], syncopal episode today while walking to the bathroom. C/o pain all over, hx chronic back pain. Pt c/o weakness x 1 week. Seen at [facility] for weakness, negative for COVID, +flu. EMS VSS.

## 2019-08-26 NOTE — ED Notes (Signed)
Pt. Walked short distance from side of bed to door. Pt was walking and breathing normally.

## 2019-08-27 ENCOUNTER — Inpatient Hospital Stay (HOSPITAL_COMMUNITY)
Admission: EM | Admit: 2019-08-27 | Discharge: 2019-08-30 | DRG: 312 | Disposition: A | Discharge status: Home / Self Care | Payer: 59 | Attending: Internal Medicine | Admitting: Internal Medicine

## 2019-08-27 ENCOUNTER — Ambulatory Visit (HOSPITAL_COMMUNITY)
Admission: RE | Admit: 2019-08-27 | Discharge: 2019-08-27 | Disposition: A | Discharge status: Home / Self Care | Payer: 59 | Source: Ambulatory Visit | Attending: Neurosurgery | Admitting: Neurosurgery

## 2019-08-27 ENCOUNTER — Emergency Department (HOSPITAL_COMMUNITY): Payer: 59

## 2019-08-27 ENCOUNTER — Other Ambulatory Visit: Payer: Self-pay

## 2019-08-27 DIAGNOSIS — Z6841 Body Mass Index (BMI) 40.0 and over, adult: Secondary | ICD-10-CM | POA: Diagnosis not present

## 2019-08-27 DIAGNOSIS — R569 Unspecified convulsions: Secondary | ICD-10-CM | POA: Diagnosis not present

## 2019-08-27 DIAGNOSIS — H5500 Unspecified nystagmus: Secondary | ICD-10-CM | POA: Diagnosis present

## 2019-08-27 DIAGNOSIS — M25552 Pain in left hip: Secondary | ICD-10-CM | POA: Diagnosis present

## 2019-08-27 DIAGNOSIS — Z881 Allergy status to other antibiotic agents status: Secondary | ICD-10-CM | POA: Diagnosis not present

## 2019-08-27 DIAGNOSIS — R519 Headache, unspecified: Secondary | ICD-10-CM | POA: Diagnosis present

## 2019-08-27 DIAGNOSIS — R4182 Altered mental status, unspecified: Secondary | ICD-10-CM

## 2019-08-27 DIAGNOSIS — Z8249 Family history of ischemic heart disease and other diseases of the circulatory system: Secondary | ICD-10-CM

## 2019-08-27 DIAGNOSIS — J45909 Unspecified asthma, uncomplicated: Secondary | ICD-10-CM | POA: Diagnosis present

## 2019-08-27 DIAGNOSIS — Z791 Long term (current) use of non-steroidal anti-inflammatories (NSAID): Secondary | ICD-10-CM | POA: Diagnosis not present

## 2019-08-27 DIAGNOSIS — I10 Essential (primary) hypertension: Secondary | ICD-10-CM | POA: Diagnosis present

## 2019-08-27 DIAGNOSIS — M25551 Pain in right hip: Secondary | ICD-10-CM | POA: Diagnosis not present

## 2019-08-27 DIAGNOSIS — M545 Low back pain, unspecified: Secondary | ICD-10-CM

## 2019-08-27 DIAGNOSIS — R55 Syncope and collapse: Secondary | ICD-10-CM | POA: Diagnosis present

## 2019-08-27 DIAGNOSIS — J9611 Chronic respiratory failure with hypoxia: Secondary | ICD-10-CM | POA: Diagnosis present

## 2019-08-27 DIAGNOSIS — W19XXXA Unspecified fall, initial encounter: Secondary | ICD-10-CM | POA: Diagnosis not present

## 2019-08-27 DIAGNOSIS — Z79890 Hormone replacement therapy (postmenopausal): Secondary | ICD-10-CM

## 2019-08-27 DIAGNOSIS — R000 Tachycardia, unspecified: Secondary | ICD-10-CM | POA: Diagnosis not present

## 2019-08-27 DIAGNOSIS — J029 Acute pharyngitis, unspecified: Secondary | ICD-10-CM | POA: Diagnosis not present

## 2019-08-27 DIAGNOSIS — R6510 Systemic inflammatory response syndrome (SIRS) of non-infectious origin without acute organ dysfunction: Secondary | ICD-10-CM

## 2019-08-27 DIAGNOSIS — Z79899 Other long term (current) drug therapy: Secondary | ICD-10-CM

## 2019-08-27 DIAGNOSIS — Z981 Arthrodesis status: Secondary | ICD-10-CM | POA: Diagnosis not present

## 2019-08-27 DIAGNOSIS — Z20822 Contact with and (suspected) exposure to covid-19: Secondary | ICD-10-CM | POA: Diagnosis present

## 2019-08-27 DIAGNOSIS — E039 Hypothyroidism, unspecified: Secondary | ICD-10-CM | POA: Diagnosis present

## 2019-08-27 DIAGNOSIS — M48062 Spinal stenosis, lumbar region with neurogenic claudication: Secondary | ICD-10-CM | POA: Insufficient documentation

## 2019-08-27 DIAGNOSIS — K219 Gastro-esophageal reflux disease without esophagitis: Secondary | ICD-10-CM | POA: Diagnosis present

## 2019-08-27 DIAGNOSIS — Z7951 Long term (current) use of inhaled steroids: Secondary | ICD-10-CM

## 2019-08-27 DIAGNOSIS — J4541 Moderate persistent asthma with (acute) exacerbation: Secondary | ICD-10-CM

## 2019-08-27 DIAGNOSIS — G8929 Other chronic pain: Secondary | ICD-10-CM | POA: Diagnosis present

## 2019-08-27 DIAGNOSIS — E669 Obesity, unspecified: Secondary | ICD-10-CM | POA: Diagnosis present

## 2019-08-27 DIAGNOSIS — R6889 Other general symptoms and signs: Secondary | ICD-10-CM

## 2019-08-27 DIAGNOSIS — R509 Fever, unspecified: Secondary | ICD-10-CM | POA: Diagnosis present

## 2019-08-27 LAB — CBC WITH DIFFERENTIAL/PLATELET
Abs Immature Granulocytes: 0.07 10*3/uL (ref 0.00–0.07)
Basophils Absolute: 0 10*3/uL (ref 0.0–0.1)
Basophils Relative: 0 %
Eosinophils Absolute: 0.1 10*3/uL (ref 0.0–0.5)
Eosinophils Relative: 0 %
HCT: 41.7 % (ref 36.0–46.0)
Hemoglobin: 13.6 g/dL (ref 12.0–15.0)
Immature Granulocytes: 1 %
Lymphocytes Relative: 9 %
Lymphs Abs: 1.3 10*3/uL (ref 0.7–4.0)
MCH: 29.6 pg (ref 26.0–34.0)
MCHC: 32.6 g/dL (ref 30.0–36.0)
MCV: 90.8 fL (ref 80.0–100.0)
Monocytes Absolute: 0.7 10*3/uL (ref 0.1–1.0)
Monocytes Relative: 5 %
Neutro Abs: 11.7 10*3/uL — ABNORMAL HIGH (ref 1.7–7.7)
Neutrophils Relative %: 85 %
Platelets: 316 10*3/uL (ref 150–400)
RBC: 4.59 MIL/uL (ref 3.87–5.11)
RDW: 13.4 % (ref 11.5–15.5)
WBC: 13.8 10*3/uL — ABNORMAL HIGH (ref 4.0–10.5)
nRBC: 0 % (ref 0.0–0.2)

## 2019-08-27 LAB — COMPREHENSIVE METABOLIC PANEL
ALT: 34 U/L (ref 0–44)
AST: 36 U/L (ref 15–41)
Albumin: 3.6 g/dL (ref 3.5–5.0)
Alkaline Phosphatase: 69 U/L (ref 38–126)
Anion gap: 11 (ref 5–15)
BUN: 14 mg/dL (ref 6–20)
CO2: 23 mmol/L (ref 22–32)
Calcium: 9 mg/dL (ref 8.9–10.3)
Chloride: 100 mmol/L (ref 98–111)
Creatinine, Ser: 0.91 mg/dL (ref 0.44–1.00)
GFR calc Af Amer: >60 mL/min (ref >60)
GFR calc non Af Amer: >60 mL/min (ref >60)
Glucose, Bld: 106 mg/dL — ABNORMAL HIGH (ref 70–99)
Potassium: 4.1 mmol/L (ref 3.5–5.1)
Sodium: 134 mmol/L — ABNORMAL LOW (ref 135–145)
Total Bilirubin: 0.7 mg/dL (ref 0.3–1.2)
Total Protein: 7.7 g/dL (ref 6.5–8.1)

## 2019-08-27 LAB — BLOOD GAS, ARTERIAL
Acid-Base Excess: 0.5 mmol/L (ref 0.0–2.0)
Bicarbonate: 23.9 mmol/L (ref 20.0–28.0)
Drawn by: 257701
FIO2: 21
O2 Saturation: 93.9 %
Patient temperature: 101.4
pCO2 arterial: 39.3 mmHg (ref 32.0–48.0)
pH, Arterial: 7.409 (ref 7.350–7.450)
pO2, Arterial: 78.1 mmHg — ABNORMAL LOW (ref 83.0–108.0)

## 2019-08-27 LAB — URINALYSIS, ROUTINE W REFLEX MICROSCOPIC
Bilirubin Urine: NEGATIVE
Glucose, UA: NEGATIVE mg/dL
Ketones, ur: NEGATIVE mg/dL
Leukocytes,Ua: NEGATIVE
Nitrite: NEGATIVE
Protein, ur: NEGATIVE mg/dL
Specific Gravity, Urine: 1.008 (ref 1.005–1.030)
pH: 8 (ref 5.0–8.0)

## 2019-08-27 LAB — LACTIC ACID, PLASMA
Lactic Acid, Venous: 1.4 mmol/L (ref 0.5–1.9)
Lactic Acid, Venous: 1.2 mmol/L (ref 0.5–1.9)

## 2019-08-27 LAB — LIPASE, BLOOD: Lipase: 23 U/L (ref 11–51)

## 2019-08-27 LAB — RAPID URINE DRUG SCREEN, HOSP PERFORMED
Amphetamines: NOT DETECTED
Barbiturates: NOT DETECTED
Benzodiazepines: NOT DETECTED
Cocaine: NOT DETECTED
Opiates: POSITIVE — AB
Tetrahydrocannabinol: NOT DETECTED

## 2019-08-27 LAB — RESPIRATORY PANEL BY RT PCR (FLU A&B, COVID)
Influenza A by PCR: NEGATIVE
Influenza B by PCR: NEGATIVE
SARS Coronavirus 2 by RT PCR: NEGATIVE

## 2019-08-27 LAB — APTT: aPTT: 28 seconds (ref 24–36)

## 2019-08-27 LAB — I-STAT BETA HCG BLOOD, ED (MC, WL, AP ONLY): I-stat hCG, quantitative: <5 m[IU]/mL (ref <5)

## 2019-08-27 LAB — PROTIME-INR
INR: 0.9 (ref 0.8–1.2)
Prothrombin Time: 12.1 seconds (ref 11.4–15.2)

## 2019-08-27 MED ORDER — SODIUM CHLORIDE 0.9 % IV SOLN
2.0000 g | Freq: Two times a day (BID) | INTRAVENOUS | Status: DC
  Administered 2019-08-27 – 2019-08-28 (×2): 2 g via INTRAVENOUS
  Filled 2019-08-27 (×2): qty 20
  Filled 2019-08-27: qty 2

## 2019-08-27 MED ORDER — ONDANSETRON HCL 4 MG PO TABS: 4.0000 mg | ORAL_TABLET | Freq: Four times a day (QID) | ORAL | Status: DC | PRN

## 2019-08-27 MED ORDER — ADULT MULTIVITAMIN W/MINERALS CH
1.0000 | ORAL_TABLET | Freq: Every day | ORAL | Status: DC
  Administered 2019-08-28 – 2019-08-30 (×3): 1 via ORAL
  Filled 2019-08-27 (×3): qty 1

## 2019-08-27 MED ORDER — VANCOMYCIN HCL 1250 MG/250ML IV SOLN
1250.0000 mg | Freq: Three times a day (TID) | INTRAVENOUS | Status: DC
  Administered 2019-08-28 (×2): 1250 mg via INTRAVENOUS
  Filled 2019-08-27 (×4): qty 250

## 2019-08-27 MED ORDER — DEXTROSE 5 % IV SOLN
10.0000 mg/kg | Freq: Three times a day (TID) | INTRAVENOUS | Status: DC
  Administered 2019-08-27 – 2019-08-28 (×2): 820 mg via INTRAVENOUS
  Filled 2019-08-27 (×5): qty 16.4

## 2019-08-27 MED ORDER — MOMETASONE FURO-FORMOTEROL FUM 200-5 MCG/ACT IN AERO
2.0000 | INHALATION_SPRAY | Freq: Two times a day (BID) | RESPIRATORY_TRACT | Status: DC
  Administered 2019-08-28 – 2019-08-30 (×5): 2 via RESPIRATORY_TRACT
  Filled 2019-08-27: qty 8.8

## 2019-08-27 MED ORDER — ACETAMINOPHEN 650 MG RE SUPP: 650.0000 mg | Freq: Four times a day (QID) | RECTAL | Status: DC | PRN

## 2019-08-27 MED ORDER — NORETHINDRONE ACET-ETHINYL EST 1-20 MG-MCG PO TABS: 1.0000 | ORAL_TABLET | Freq: Every day | ORAL | Status: DC

## 2019-08-27 MED ORDER — FUROSEMIDE 40 MG PO TABS
40.0000 mg | ORAL_TABLET | Freq: Every day | ORAL | Status: DC
  Administered 2019-08-28 – 2019-08-30 (×3): 40 mg via ORAL
  Filled 2019-08-27 (×4): qty 1

## 2019-08-27 MED ORDER — ACETAMINOPHEN 650 MG RE SUPP
650.0000 mg | Freq: Once | RECTAL | Status: AC
  Administered 2019-08-27: 14:00:00 650 mg via RECTAL
  Filled 2019-08-27: qty 1

## 2019-08-27 MED ORDER — ALPRAZOLAM 0.5 MG PO TABS
0.5000 mg | ORAL_TABLET | Freq: Two times a day (BID) | ORAL | Status: DC | PRN
  Administered 2019-08-29 – 2019-08-30 (×2): 0.5 mg via ORAL
  Filled 2019-08-27 (×2): qty 1

## 2019-08-27 MED ORDER — ALBUTEROL SULFATE (2.5 MG/3ML) 0.083% IN NEBU: 3.0000 mL | INHALATION_SOLUTION | Freq: Four times a day (QID) | RESPIRATORY_TRACT | Status: DC | PRN

## 2019-08-27 MED ORDER — SENNOSIDES-DOCUSATE SODIUM 8.6-50 MG PO TABS: 1.0000 | ORAL_TABLET | Freq: Every evening | ORAL | Status: DC | PRN

## 2019-08-27 MED ORDER — SODIUM CHLORIDE 0.9 % IV SOLN: 2.0000 g | INTRAVENOUS | Status: DC

## 2019-08-27 MED ORDER — ONDANSETRON HCL 4 MG/2ML IJ SOLN: 4.0000 mg | Freq: Four times a day (QID) | INTRAMUSCULAR | Status: DC | PRN

## 2019-08-27 MED ORDER — PAROXETINE HCL 20 MG PO TABS
60.0000 mg | ORAL_TABLET | Freq: Every day | ORAL | Status: DC
  Administered 2019-08-28 – 2019-08-30 (×3): 60 mg via ORAL
  Filled 2019-08-27 (×3): qty 3

## 2019-08-27 MED ORDER — LORATADINE 10 MG PO TABS
10.0000 mg | ORAL_TABLET | Freq: Every day | ORAL | Status: DC
  Administered 2019-08-28 – 2019-08-30 (×3): 10 mg via ORAL
  Filled 2019-08-27 (×4): qty 1

## 2019-08-27 MED ORDER — METRONIDAZOLE IN NACL 5-0.79 MG/ML-% IV SOLN
500.0000 mg | Freq: Once | INTRAVENOUS | Status: AC
  Administered 2019-08-27: 15:00:00 500 mg via INTRAVENOUS
  Filled 2019-08-27: qty 100

## 2019-08-27 MED ORDER — ACETAMINOPHEN 325 MG PO TABS: 650.0000 mg | ORAL_TABLET | Freq: Four times a day (QID) | ORAL | Status: DC | PRN

## 2019-08-27 MED ORDER — SODIUM CHLORIDE 0.9 % IV SOLN
2.0000 g | Freq: Once | INTRAVENOUS | Status: AC
  Administered 2019-08-27: 15:00:00 2 g via INTRAVENOUS
  Filled 2019-08-27: qty 2

## 2019-08-27 MED ORDER — LACTATED RINGERS IV BOLUS
1000.0000 mL | Freq: Once | INTRAVENOUS | Status: AC
  Administered 2019-08-27: 1000 mL via INTRAVENOUS

## 2019-08-27 MED ORDER — SODIUM CHLORIDE 0.9 % IV SOLN: INTRAVENOUS | Status: DC

## 2019-08-27 MED ORDER — AMLODIPINE BESYLATE 10 MG PO TABS
10.0000 mg | ORAL_TABLET | Freq: Every day | ORAL | Status: DC
  Administered 2019-08-28 – 2019-08-30 (×3): 10 mg via ORAL
  Filled 2019-08-27 (×3): qty 1

## 2019-08-27 MED ORDER — SODIUM CHLORIDE 0.9 % IV SOLN
1000.0000 mL | INTRAVENOUS | Status: DC
  Administered 2019-08-27: 14:00:00 1000 mL via INTRAVENOUS

## 2019-08-27 MED ORDER — VANCOMYCIN HCL IN DEXTROSE 1-5 GM/200ML-% IV SOLN: 1000.0000 mg | Freq: Once | INTRAVENOUS | Status: DC

## 2019-08-27 MED ORDER — PANTOPRAZOLE SODIUM 40 MG PO TBEC
40.0000 mg | DELAYED_RELEASE_TABLET | Freq: Every day | ORAL | Status: DC
  Administered 2019-08-28 – 2019-08-30 (×3): 40 mg via ORAL
  Filled 2019-08-27 (×3): qty 1

## 2019-08-27 MED ORDER — IPRATROPIUM-ALBUTEROL 0.5-2.5 (3) MG/3ML IN SOLN: 3.0000 mL | Freq: Four times a day (QID) | RESPIRATORY_TRACT | Status: DC | PRN

## 2019-08-27 MED ORDER — ACETAMINOPHEN 325 MG PO TABS
650.0000 mg | ORAL_TABLET | Freq: Four times a day (QID) | ORAL | Status: DC | PRN
  Administered 2019-08-28 – 2019-08-29 (×2): 650 mg via ORAL
  Filled 2019-08-27 (×2): qty 2

## 2019-08-27 MED ORDER — LEVOTHYROXINE SODIUM 75 MCG PO TABS
75.0000 ug | ORAL_TABLET | Freq: Every day | ORAL | Status: DC
  Administered 2019-08-28 – 2019-08-30 (×3): 75 ug via ORAL
  Filled 2019-08-27 (×3): qty 1

## 2019-08-27 MED ORDER — MONTELUKAST SODIUM 10 MG PO TABS
10.0000 mg | ORAL_TABLET | Freq: Every evening | ORAL | Status: DC
  Administered 2019-08-27 – 2019-08-29 (×3): 10 mg via ORAL
  Filled 2019-08-27 (×4): qty 1

## 2019-08-27 MED ORDER — VANCOMYCIN HCL 2000 MG/400ML IV SOLN
2000.0000 mg | Freq: Once | INTRAVENOUS | Status: AC
  Administered 2019-08-27: 2000 mg via INTRAVENOUS
  Filled 2019-08-27: qty 400

## 2019-08-27 MED ORDER — FLUTICASONE PROPIONATE 50 MCG/ACT NA SUSP
2.0000 | Freq: Every day | NASAL | Status: DC
  Administered 2019-08-28 – 2019-08-29 (×2): 2 via NASAL
  Filled 2019-08-27: qty 16

## 2019-08-27 NOTE — ED Notes (Signed)
Pts linen soiled. Linens changed and pt repositioned in bed.

## 2019-08-27 NOTE — H&P (Signed)
History and Physical    Carolyn CheshireKaren M Clauson WUJ:[id number] DOB: 1981/04/30 DOA: 08/27/2019  Referring MD/NP/PA: Linwood DibblesJon Knapp, EDP PCP: [name], Carol, MD  Patient coming from: Home  Chief Complaint: Syncope, fever  HPI: Carolyn Castro is a 39 y.o. female with past medical history significant for hypothyroidism, asthma, morbid obesity, hypertension, GERD.  She had a syncopal event yesterday and presented to the emergency department and was discharged home after a fairly normal work-up.  This morning she was scheduled for an MRI of her lumbar spine as ordered by her neurosurgeon as an outpatient for bilateral lower extremity weakness.  At the completion of her MRI she was noticed to be non-responsive.  She was not noticed to be in respiratory distress, per report there was no response to noxious stimuli.  She was noted to have nystagmus at that time, pupils were equal round and reactive to light per report.  She was then sent to the ED for evaluation.  In the ED labs are noted to be fairly unremarkable, WBC count was 13.8, she had a negative chest x-ray and a negative urine analysis.  CT scan of the head is unremarkable for acute findings.  UDS is positive for opiates and I do not see any on her home medication list.  She did have a temperature of 101.4 on arrival.  At the time of my evaluation she is alert and oriented.  She tells me that [facility] she was diagnosed with influenza, was not given Tamiflu.  Since then she has been having headaches, fevers, body aches, sore throat, cough and mild shortness of breath above her baseline.  Influenza A and B and rapid Covid PCR test are negative.  Admission has been requested for further evaluation and management.  Past Medical/Surgical History: Past Medical History:  Diagnosis Date  . Anxiety   . Asthma   . Back pain   . Constipation   . Fatigue   . Hypertension   . Hypothyroid   . Hypothyroidism     Past Surgical History:    Procedure Laterality Date  . BACK SURGERY    . COLONOSCOPY    . ESOPHAGOGASTRODUODENOSCOPY (EGD) WITH PROPOFOL N/A 01/28/2019   Procedure: ESOPHAGOGASTRODUODENOSCOPY (EGD) WITH PROPOFOL;  Surgeon: Carman ChingEdwards, James, MD;  Location: [facility];  Service: Endoscopy;  Laterality: N/A;  . LAPAROSCOPIC OVARIAN CYSTECTOMY Left 06/29/2018   Procedure: LAPAROSCOPIC OVARIAN CYSTECTOMY;  Surgeon: Gerald Leitzole, Tara, MD;  Location: [facility];  Service: Gynecology;  Laterality: Left;  . TONSILLECTOMY    . WISDOM TOOTH EXTRACTION      Social History:  reports that she has never smoked. She has never used smokeless tobacco. She reports that she does not drink alcohol or use drugs.  Allergies: Allergies  Allergen Reactions  . Ciprofloxacin Hcl Diarrhea and Other (See Comments)    Developed C-Diff    Family History:  Family History  Problem Relation Age of Onset  . Hypertension Mother   . Hypertension Father     Prior to Admission medications   Medication Sig Start Date End Date Taking? Authorizing Provider  acetaminophen (TYLENOL) 500 MG tablet Take 1,000 mg by mouth every 6 (six) hours as needed (for pain).     [provider]  albuterol (VENTOLIN HFA) 108 (90 Base) MCG/ACT inhaler Inhale 2 puffs into the lungs every 6 (six) hours as needed for wheezing or shortness of breath. 03/31/19   Junie SpencerHawks, Christy A, FNP  ALPRAZolam Prudy Feeler(XANAX) 0.5 MG tablet  Take 1 tablet (0.5 mg total) by mouth 3 (three) times daily as needed for anxiety. Patient not taking: Reported on 08/26/2019 09/27/18   Lauraine Rinneottle, Carey G Jr., MD  ALPRAZolam Prudy Feeler(XANAX) 1 MG tablet Take 0.5 mg by mouth 2 (two) times daily as needed for anxiety.  02/18/19   [provider]  amLODipine (NORVASC) 10 MG tablet Take 10 mg by mouth daily.    [provider]  benzonatate (TESSALON) 100 MG capsule Take 1 capsule (100 mg total) by mouth 2 (two) times daily as needed for cough. Patient not taking: Reported on 08/19/2019  08/02/19   Arvilla MarketWallace, Catherine Lauren, DO  budesonide-formoterol [facility](SYMBICORT) 160-4.5 MCG/ACT inhaler Inhale 2 puffs into the lungs 2 (two) times daily. 08/14/19   Leslye PeerByrum, Robert S, MD  cholecalciferol (VITAMIN D3) 25 MCG (1000 UT) tablet Take 1,000 Units by mouth daily.    [provider]  CRANBERRY PO Take 1 tablet by mouth 2 (two) times daily.     [provider]  cyclobenzaprine (FLEXERIL) 10 MG tablet Take 1 tablet (10 mg total) by mouth 2 (two) times daily as needed for muscle spasms. 08/26/19   Luevenia MaxinFawze, Mina A, PA-C  diphenhydrAMINE (BENADRYL) 25 MG tablet Take 50 mg by mouth as needed for itching or allergies.     [provider]  Docusate Sodium (STOOL SOFTENER) 100 MG capsule Take 100 mg by mouth 2 (two) times daily.    [provider]  ferrous sulfate 325 (65 FE) MG tablet Take 1 tablet (325 mg total) by mouth 2 (two) times daily with a meal. Patient not taking: Reported on 08/26/2019 01/04/19   Elease EtienneHongalgi, Anand D, MD  fexofenadine (ALLEGRA) 180 MG tablet Take 180 mg by mouth daily.    [provider]  furosemide (LASIX) 40 MG tablet Take 40 mg by mouth daily. 01/14/19   [provider]  gabapentin (NEURONTIN) 300 MG capsule Take 300 mg by mouth 3 (three) times daily as needed (pain).  04/17/19   [provider]  ibuprofen (ADVIL) 600 MG tablet Take 1 tablet (600 mg total) by mouth every 6 (six) hours as needed. 08/26/19   Michela PitcherFawze, Mina A, PA-C  ipratropium (ATROVENT) 0.02 % nebulizer solution Take 2.5 mLs (0.5 mg total) by nebulization every 6 (six) hours as needed for wheezing or shortness of breath. Patient not taking: Reported on 08/26/2019 08/02/19   Arvilla MarketWallace, Catherine Lauren, DO  ipratropium-albuterol (DUONEB) 0.5-2.5 (3) MG/3ML SOLN Take 3 mLs by nebulization every 6 (six) hours as needed. 04/26/19   [provider]  levothyroxine (SYNTHROID, LEVOTHROID) 75 MCG tablet Take 75 mcg by mouth daily before breakfast.    [provider]  meloxicam (MOBIC) 15 MG tablet Take 1 tablet (15 mg total) by mouth daily. Patient taking differently: Take 15 mg by mouth daily as needed for pain.  08/26/19   Felecia ShellingEvans, Brent M, DPM  mometasone (NASONEX) 50 MCG/ACT nasal spray Place 2 sprays into the nose daily.    [provider]  montelukast (SINGULAIR) 10 MG tablet Take 10 mg by mouth every evening.     [provider]  Multiple Vitamins-Minerals (WOMENS MULTI PO) Take 1 tablet by mouth daily.     [provider]  norethindrone-ethinyl estradiol (MICROGESTIN,JUNEL,LOESTRIN) 1-20 MG-MCG tablet Take 1 tablet by mouth daily.    [provider]  omeprazole (PRILOSEC) 20 MG capsule Take 20 mg by mouth 2 (two) times daily. 07/21/19   [provider]  ondansetron (ZOFRAN-ODT) 4 MG  disintegrating tablet Take 4 mg by mouth daily as needed for nausea.  03/05/19   [provider]  PARoxetine (PAXIL) 30 MG tablet TAKE 2 TABLETS(60 MG) BY MOUTH DAILY Patient taking differently: Take 60 mg by mouth daily.  08/07/19   Cottle, Steva Ready., MD  Probiotic Product (PROBIOTIC DAILY PO) Take 1 tablet by mouth daily.    [provider]  Promethazine-Codeine 6.25-10 MG/5ML SOLN Take 5 mLs by mouth every 6 (six) hours as needed (cough).  08/21/19   [provider]  traZODone (DESYREL) 50 MG tablet Take 2 tablets (100 mg total) by mouth at bedtime as needed for sleep. 06/12/19   Cottle, Steva Ready., MD    Review of Systems:  Constitutional: Positive for fever, chills, diaphoresis, appetite change and fatigue.  HEENT: Denies photophobia, eye pain, redness, hearing loss, ear pain, mouth sores, trouble swallowing, neck pain, neck stiffness and tinnitus.   Respiratory: Denies SOB, DOE, cough, chest tightness,  and wheezing.   Cardiovascular: Denies chest pain, palpitations and leg swelling.  Gastrointestinal: Denies nausea, vomiting, abdominal pain, diarrhea, constipation, blood in stool and  abdominal distention.  Genitourinary: Denies dysuria, urgency, frequency, hematuria, flank pain and difficulty urinating.  Endocrine: Denies: hot or cold intolerance, sweats, changes in hair or nails, polyuria, polydipsia. Musculoskeletal: Denies myalgias, back pain, joint swelling, arthralgias and gait problem.  Skin: Denies pallor, rash and wound.  Neurological: Denies dizziness. Hematological: Denies adenopathy. Easy bruising, personal or family bleeding history  Psychiatric/Behavioral: Denies suicidal ideation, mood changes, confusion, nervousness, sleep disturbance and agitation    Physical Exam: Vitals:   08/27/19 1633 08/27/19 1700 08/27/19 1715 08/27/19 1745  BP: (!) 146/101 129/88 115/77 114/85  Pulse: (!) 101 (!) 103 (!) 104 98  Resp: 17 (!) 21 (!) 21 (!) 21  Temp:      TempSrc:      SpO2: 95% 95% 96% 96%     Constitutional: NAD, calm, comfortable, obese Eyes: PERRL, lids and conjunctivae normal ENMT: Mucous membranes are moist.  Neck: normal, supple, no masses, no thyromegaly Respiratory: clear to auscultation bilaterally, no wheezing, no crackles. Normal respiratory effort. No accessory muscle use.  Cardiovascular: Regular rate and rhythm, no murmurs / rubs / gallops. No extremity edema. 2+ pedal pulses. No carotid bruits.  Abdomen: Obese, no tenderness, no masses palpated. No hepatosplenomegaly. Bowel sounds positive.  Musculoskeletal: no clubbing / cyanosis. No joint deformity upper and lower extremities. Good ROM, no contractures. Normal muscle tone.  Skin: no rashes, lesions, ulcers. No induration Neurologic: CN 2-12 grossly intact. Sensation intact.  Psychiatric: Normal judgment and insight. Alert and oriented x 3. Normal mood.    Labs on Admission: I have personally reviewed the following labs and imaging studies  CBC: Recent Labs  Lab 08/26/19 2015 08/27/19 1308  WBC 8.9 13.8*  NEUTROABS 5.5 11.7*  HGB 13.1 13.6  HCT 40.8 41.7  MCV 91.3 90.8  PLT 311  316   Basic Metabolic Panel: Recent Labs  Lab 08/26/19 2015 08/27/19 1308  NA 138 134*  K 3.9 4.1  CL 106 100  CO2 20* 23  GLUCOSE 96 106*  BUN 13 14  CREATININE 0.95 0.91  CALCIUM 8.4* 9.0   GFR: Estimated Creatinine Clearance: 109.3 mL/min (by C-G formula based on SCr of 0.91 mg/dL). Liver Function Tests: Recent Labs  Lab 08/26/19 2015 08/27/19 1308  AST 24 36  ALT 27 34  ALKPHOS 60 69  BILITOT 0.4 0.7  PROT 6.2* 7.7  ALBUMIN 3.1*  3.6   Recent Labs  Lab 08/27/19 1308  LIPASE 23   No results for input(s): AMMONIA in the last 168 hours. Coagulation Profile: Recent Labs  Lab 08/27/19 1308  INR 0.9   Cardiac Enzymes: No results for input(s): CKTOTAL, CKMB, CKMBINDEX, TROPONINI in the last 168 hours. BNP (last 3 results) No results for input(s): PROBNP in the last 8760 hours. HbA1C: No results for input(s): HGBA1C in the last 72 hours. CBG: No results for input(s): GLUCAP in the last 168 hours. Lipid Profile: No results for input(s): CHOL, HDL, LDLCALC, TRIG, CHOLHDL, LDLDIRECT in the last 72 hours. Thyroid Function Tests: No results for input(s): TSH, T4TOTAL, FREET4, T3FREE, THYROIDAB in the last 72 hours. Anemia Panel: No results for input(s): VITAMINB12, FOLATE, FERRITIN, TIBC, IRON, RETICCTPCT in the last 72 hours. Urine analysis:    Component Value Date/Time   COLORURINE STRAW (A) 08/27/2019 1430   APPEARANCEUR CLEAR 08/27/2019 1430   LABSPEC 1.008 08/27/2019 1430   PHURINE 8.0 08/27/2019 1430   GLUCOSEU NEGATIVE 08/27/2019 1430   HGBUR SMALL (A) 08/27/2019 1430   BILIRUBINUR NEGATIVE 08/27/2019 1430   KETONESUR NEGATIVE 08/27/2019 1430   PROTEINUR NEGATIVE 08/27/2019 1430   NITRITE NEGATIVE 08/27/2019 1430   LEUKOCYTESUR NEGATIVE 08/27/2019 1430   Sepsis Labs: @LABRCNTIP (procalcitonin:4,lacticidven:4) ) Recent Results (from the past 240 hour(s))  Respiratory Panel by RT PCR (Flu A&B, Covid) - Nasopharyngeal Swab     Status: None    Collection Time: 08/27/19  1:16 PM   Specimen: Nasopharyngeal Swab  Result Value Ref Range Status   SARS Coronavirus 2 by RT PCR NEGATIVE NEGATIVE Final    Comment: (NOTE) SARS-CoV-2 target nucleic acids are NOT DETECTED. The SARS-CoV-2 RNA is generally detectable in upper respiratoy specimens during the acute phase of infection. The lowest concentration of SARS-CoV-2 viral copies this assay can detect is 131 copies/mL. A negative result does not preclude SARS-Cov-2 infection and should not be used as the sole basis for treatment or other patient management decisions. A negative result may occur with  improper specimen collection/handling, submission of specimen other than nasopharyngeal swab, presence of viral mutation(s) within the areas targeted by this assay, and inadequate number of viral copies (<131 copies/mL). A negative result must be combined with clinical observations, patient history, and epidemiological information. The expected result is Negative. Fact Sheet for Patients:  https://www.moore.com/ Fact Sheet for Healthcare Providers:  https://www.young.biz/ This test is not yet ap proved or cleared by the [place] FDA and  has been authorized for detection and/or diagnosis of SARS-CoV-2 by FDA under an Emergency Use Authorization (EUA). This EUA will remain  in effect (meaning this test can be used) for the duration of the COVID-19 declaration under Section 564(b)(1) of the Act, 21 U.S.C. section 360bbb-3(b)(1), unless the authorization is terminated or revoked sooner.    Influenza A by PCR NEGATIVE NEGATIVE Final   Influenza B by PCR NEGATIVE NEGATIVE Final    Comment: (NOTE) The Xpert Xpress SARS-CoV-2/FLU/RSV assay is intended as an aid in  the diagnosis of influenza from Nasopharyngeal swab specimens and  should not be used as a sole basis for treatment. Nasal washings and  aspirates are unacceptable for Xpert Xpress  SARS-CoV-2/FLU/RSV  testing. Fact Sheet for Patients: https://www.moore.com/ Fact Sheet for Healthcare Providers: https://www.young.biz/ This test is not yet approved or cleared by the [place] FDA and  has been authorized for detection and/or diagnosis of SARS-CoV-2 by  FDA under an Emergency Use Authorization (EUA). This EUA will remain  in effect (meaning this test can be used) for the duration of the  Covid-19 declaration under Section 564(b)(1) of the Act, 21  U.S.C. section 360bbb-3(b)(1), unless the authorization is  terminated or revoked. Performed at [facility], [address], [place], [place] [address]      Radiological Exams on Admission: CT Head Wo Contrast  Result Date: 08/27/2019 CLINICAL DATA:  39 year old female with altered mental status. EXAM: CT HEAD WITHOUT CONTRAST TECHNIQUE: Contiguous axial images were obtained from the base of the skull through the vertex without intravenous contrast. COMPARISON:  Head CT dated 08/26/2019. FINDINGS: Brain: The ventricles and sulci appropriate size for patient's age. The gray-white matter discrimination is preserved. There is no acute intracranial hemorrhage. No mass effect or midline shift. No extra-axial fluid collection. Vascular: No hyperdense vessel or unexpected calcification. Skull: Normal. Negative for fracture or focal lesion. Sinuses/Orbits: No acute finding. Other: None IMPRESSION: Unremarkable noncontrast CT of the brain. Electronically Signed   By: Elgie Collard M.D.   On: 08/27/2019 16:35   CT Head Wo Contrast  Result Date: 08/26/2019 CLINICAL DATA:  Syncope, weakness, headache EXAM: CT HEAD WITHOUT CONTRAST TECHNIQUE: Contiguous axial images were obtained from the base of the skull through the vertex without intravenous contrast. COMPARISON:  12/26/2018 FINDINGS: Brain: No acute infarct or hemorrhage. Lateral ventricles and midline structures are  unremarkable. No acute extra-axial fluid collections. No mass effect. Vascular: No hyperdense vessel or unexpected calcification. Skull: Normal. Negative for fracture or focal lesion. Sinuses/Orbits: No acute finding. Other: None IMPRESSION: 1. Stable head CT, no acute process. Electronically Signed   By: Sharlet Salina M.D.   On: 08/26/2019 21:04   CT Cervical Spine Wo Contrast  Result Date: 08/26/2019 CLINICAL DATA:  Syncope, fell, diffuse pain EXAM: CT CERVICAL and LUMBAR SPINE WITHOUT CONTRAST TECHNIQUE: Multidetector CT imaging of the cervical and lumbar spine was performed without intravenous contrast. Multiplanar CT image reconstructions were also generated. COMPARISON:  09/04/2018 FINDINGS: CT CERVICAL SPINE FINDINGS Alignment: Alignment is anatomic. Skull base and vertebrae: No acute displaced fractures. Soft tissues and spinal canal: No prevertebral fluid or swelling. No visible canal hematoma. Disc levels: Disc spaces are well preserved. No significant central canal or neural foraminal encroachment. Upper chest: Airway is patent. Visualized portions of the lung apices are clear. Reconstructed images demonstrate no additional findings. CT LUMBAR SPINE FINDINGS Segmentation: There are 6 non-rib-bearing lumbar type vertebral bodies. Alignment: There is been and discectomy at L6/S1 with posterior fusion at that level. Persistent grade 1 anterolisthesis of L6 relative to S1. Vertebrae: No acute displaced fractures. Paraspinal and other soft tissues: Negative. Disc levels: Aside from the postsurgical changes at L6/S1, the remaining intervertebral disc spaces are unremarkable. IMPRESSION: 1. No acute displaced cervical or lumbar spine fracture. 2. There are 6 non-rib-bearing lumbar type vertebral bodies. 3. Postsurgical changes at L6/S1 with posterior fusion at that level. Persistent grade 1 anterolisthesis of L6 relative to S1. Electronically Signed   By: Sharlet Salina M.D.   On: 08/26/2019 21:11   CT  Lumbar Spine Wo Contrast  Result Date: 08/26/2019 CLINICAL DATA:  Syncope, fell, diffuse pain EXAM: CT CERVICAL and LUMBAR SPINE WITHOUT CONTRAST TECHNIQUE: Multidetector CT imaging of the cervical and lumbar spine was performed without intravenous contrast. Multiplanar CT image reconstructions were also generated. COMPARISON:  09/04/2018 FINDINGS: CT CERVICAL SPINE FINDINGS Alignment: Alignment is anatomic. Skull base and vertebrae: No acute displaced fractures. Soft tissues and spinal canal: No prevertebral fluid or swelling. No visible canal  hematoma. Disc levels: Disc spaces are well preserved. No significant central canal or neural foraminal encroachment. Upper chest: Airway is patent. Visualized portions of the lung apices are clear. Reconstructed images demonstrate no additional findings. CT LUMBAR SPINE FINDINGS Segmentation: There are 6 non-rib-bearing lumbar type vertebral bodies. Alignment: There is been and discectomy at L6/S1 with posterior fusion at that level. Persistent grade 1 anterolisthesis of L6 relative to S1. Vertebrae: No acute displaced fractures. Paraspinal and other soft tissues: Negative. Disc levels: Aside from the postsurgical changes at L6/S1, the remaining intervertebral disc spaces are unremarkable. IMPRESSION: 1. No acute displaced cervical or lumbar spine fracture. 2. There are 6 non-rib-bearing lumbar type vertebral bodies. 3. Postsurgical changes at L6/S1 with posterior fusion at that level. Persistent grade 1 anterolisthesis of L6 relative to S1. Electronically Signed   By: Sharlet Salina M.D.   On: 08/26/2019 21:11   MR LUMBAR SPINE WO CONTRAST  Result Date: 08/27/2019 CLINICAL DATA:  Chronic back pain, weakness EXAM: MRI LUMBAR SPINE WITHOUT CONTRAST TECHNIQUE: Multiplanar, multisequence MR imaging of the lumbar spine was performed. No intravenous contrast was administered. COMPARISON:  CT 08/26/2019, MRI 12/26/2018 FINDINGS: Segmentation: 5 lumbar type vertebral bodies  as designated on prior MRI report of 12/26/2018 with the operative level being labeled as L5-S1. Alignment:  Chronic grade 2 anterolisthesis L5 on S1. Vertebrae: Since the previous MRI, there has been posterior and interbody fusion at L5-S1 with posterior decompression. No fracture, evidence of discitis, or bone lesion. Conus medullaris and cauda equina: Conus extends to the T12-L1 level. Conus and cauda equina appear normal. Paraspinal and other soft tissues: Negative. Disc levels: T12-L1: Unremarkable. L1-L2: Unremarkable. L2-L3: Unremarkable. L3-L4: Unremarkable. L4-L5: Unremarkable disc. Mild epidural lipomatosis, which appears slightly less prominent compared to prior. No foraminal or canal stenosis. L5-S1: Interval fusion and posterior decompression. Evaluation of this level is largely degraded by metallic susceptibility artifact. Bilateral foramina appear to be somewhat improved, although are largely obscured. Epidural lipomatosis is partially visualized. IMPRESSION: 1. Interval L5-S1 posterior and interbody fusion with posterior decompression. Evaluation of this level is degraded by metallic susceptibility artifact. Bilateral L5-S1 foramina appear somewhat improved, although are largely obscured by metallic susceptibility artifact. 2. Epidural lipomatosis at L4-5 and L5-S1, which appears slightly less prominent compared to prior. 3. Remaining vertebral levels are unremarkable without foraminal or canal stenosis. Electronically Signed   By: Duanne Guess D.O.   On: 08/27/2019 13:15   DG Chest Port 1 View  Result Date: 08/27/2019 CLINICAL DATA:  Unresponsive EXAM: PORTABLE CHEST 1 VIEW COMPARISON:  05/21/2019 FINDINGS: Patient rotated minimally left. Midline trachea. Normal heart size for level of inspiration. Mildly degraded exam due to AP portable technique and patient body habitus. No pleural fluid. Low lung volumes with resultant pulmonary interstitial prominence. No lobar consolidation. No overt  congestive failure. IMPRESSION: Low lung volumes, without definite acute disease. Electronically Signed   By: Jeronimo Greaves M.D.   On: 08/27/2019 13:51    EKG: Not obtained as not clinically indicated  Assessment/Plan Principal Problem:   Syncope Active Problems:   Hypothyroidism   Asthma   Obesity, Class III, BMI 40-49.9 (morbid obesity) (HCC)   Essential hypertension   GERD (gastroesophageal reflux disease)   Fever   Flu-like symptoms   Chronic hypoxemic respiratory failure (HCC)    Syncopal event -Etiology remains unclear at this time.   -Seizure activity would be high on the differential although there was no report of tonic-clonic activity during MRI scan today.   -With  fever, headache believe we should rule out CNS infection with LP.  This has been ordered with interventional radiology to be done under fluoroscopy.  Given her morbid obesity, EDP does not believe she would be appropriate for bedside LP. -I also wonder about possibility of opiate sedation as her UDS was positive for opiates and it appears she is not prescribed any.  She denies taking any new medications.  I have queried the controlled substance database, last opiate that was prescribed to her was in August 2020: 40 tablets of 5 mg of oxycodone. -Will empirically place on vancomycin, Rocephin, acyclovir pending LP results.  - MRI of the brain has been requested. -EEG ordered. -For completeness sake, will check orthostatic vital signs, 2D echo. -I have discussed case with neurologist on-call, Dr. Rory Percy.  At his recommendation, patient will be transferred to [facility] where they will see her in consultation.  Flulike symptoms -Per patient report she was diagnosed with influenza last week  (not clear if A or B).   -In ED, flu a and B, rapid Covid PCR is negative. -Will admit as a PUI until PCR has resulted.  Hypothyroidism -Check TSH, continue home dose of Synthroid  Asthma -Currently compensated, continue  Singulair, DuoNeb, Dulera.  GERD -Continue PPI therapy  Essential hypertension -Currently well controlled. -Not on any medications at home.  Morbid obesity -Noted.   DVT prophylaxis: SCDs Code Status: Full code Family Communication: Patient only Disposition Plan: Ad[facility] to [facility] Consults called: Neurology Admission status: Inpatient     Time Spent: 75 minutes  Collin Hendley Isaac Bliss MD [facility] Pager [phone]  If 7PM-7AM, please contact night-coverage [email or web address] Password [facility]  08/27/2019, 6:05 PM

## 2019-08-27 NOTE — ED Provider Notes (Signed)
Pt presented to the ED for recurrent syncope in the setting of chronic recurrent back pain.  Pt seen in the ED yesterday.  Dischared after reassuring workup.   Outpt MRI today.  No acute findings.   Pt had recurrent syncope in MRI.  Persistent fever.  Unclear etiology.  With the recurrent episodes will admit for observation.  Consider echocardiogram.     Linwood Dibbles, MD 08/27/19 1650

## 2019-08-27 NOTE — Plan of Care (Signed)
Called by Dr. Ardyth Harps regarding this patient who presents with altered mental status, fevers and headaches. Recommended doing a spinal tap emergently prior to admission to [facility] should there be a need for EEG or other tests which are not available  at [facility]  Patient has been seen in the past by neurology service and examination was concerning for lumbar pathology as well as embellishment on top of real pathology per the neurological consultation last year.  Today, has documented fever, tachycardic, possible Covid-like symptoms but due to the headache and episodic altered mental status neurology consultation has been requested.  Please call neurology once the patient arrives at [facility]  -- Milon Dikes, MD [facility] Pager: [phone] If 7pm to 7am, please call on call as listed on AMION.

## 2019-08-27 NOTE — ED Notes (Addendum)
Pt more alert at this time. Pt responding to questions. When CT asked pt to cross her arms, pt shook her head no. Pt advised that she could cross her arms and need to try. Pt crossed her arms without difficulty

## 2019-08-27 NOTE — ED Provider Notes (Signed)
[facility] DEPT Provider Note   CSN: [id number] Arrival date & time: 08/27/19  1250     History Chief Complaint  Patient presents with  . Unresponsive    Carolyn Castro is a 39 y.o. female.  HPI Patient with history of anxiety, asthma, fatigue, hypertension, chronic back pain, GERD, obesity, lumbar stenosis was at MRI today when she had an episode of loss of consciousness or poorly responsive episode at MRI.  She was responsive to painful stimulus.  On arrival to MRI she was alert and oriented and ambulatory.  Patient is not giving me any additional history at this time.  She does not appear obtunded but not currently interacting verbally. REVIEW of ED chart from YESTERDAY indicates:   She reports that for the last 4 days she has felt generalized body aches, fevers at home, generalized weakness.  She went to [facility] 2 days ago and reports she tested negative for Covid but tested positive for influenza.  She states she has been trying to stay hydrated but has had decreased appetite and decreased oral intake.  Earlier today she was walking to the bathroom with her walker when she began to feel lightheaded and reports that she lost consciousness, landing on her back and struck the back of her head on the hardwood floor.  She is unsure how long she had lost consciousness but when she regained consciousness she reports throbbing pain to the back of her head radiating all the way down the spine into the low back and sacral region.  She had a few episodes of nonbloody nonbilious emesis after regaining consciousness.  Reports feeling as though her vision is blurred bilaterally.  She reports pain radiates down the bilateral lower extremities and reports feeling "completely numb "from the knees down to the toes.  Pain worsens with movement.  She denies bowel or bladder incontinence or saddle anesthesia.  She has a history of chronic back pain and has had a prior  lumbar fusion surgery and sees Dr. Kathyrn Sheriff for reevaluation.  In fact he had a visit with him earlier today and he is planning on obtaining an outpatient MRI of the lumbar spine.   Past Medical History:  Diagnosis Date  . Anxiety   . Asthma   . Back pain   . Constipation   . Fatigue   . Hypertension   . Hypothyroid   . Hypothyroidism     Patient Active Problem List   Diagnosis Date Noted  . Suspected COVID-19 virus infection 08/02/2019  . Shortness of breath 05/09/2019  . Acute recurrent frontal sinusitis 05/09/2019  . Allergic rhinitis 02/20/2019  . Normocytic anemia 01/02/2019  . Hyperglycemia 01/02/2019  . GERD (gastroesophageal reflux disease) 01/02/2019  . Acute paraplegia ([place]) 12/26/2018  . Lumbar stenosis with neurogenic claudication 12/26/2018  . Obesity, Class III, BMI 40-49.9 (morbid obesity) ([place]) 12/26/2018  . Essential hypertension 12/26/2018  . Near syncope 12/26/2018  . Lumbar stenosis 12/26/2018  . Asthma 12/25/2018  . Snoring 12/25/2018  . Low back pain 09/04/2018  . Hypothyroidism 09/04/2018  . Infected incision 07/04/2018  . Adnexal mass 06/29/2018  . Pelvic pain 06/29/2018    Past Surgical History:  Procedure Laterality Date  . BACK SURGERY    . COLONOSCOPY    . ESOPHAGOGASTRODUODENOSCOPY (EGD) WITH PROPOFOL N/A 01/28/2019   Procedure: ESOPHAGOGASTRODUODENOSCOPY (EGD) WITH PROPOFOL;  Surgeon: Laurence Spates, MD;  Location: [facility];  Service: Endoscopy;  Laterality: N/A;  . LAPAROSCOPIC OVARIAN CYSTECTOMY Left 06/29/2018  Procedure: LAPAROSCOPIC OVARIAN CYSTECTOMY;  Surgeon: Gerald Leitz, MD;  Location: [facility];  Service: Gynecology;  Laterality: Left;  . TONSILLECTOMY    . WISDOM TOOTH EXTRACTION       OB History    Gravida  0   Para  0   Term  0   Preterm  0   AB  0   Living  0     SAB  0   TAB  0   Ectopic  0   Multiple  0   Live Births  0           Family History  Problem Relation Age of  Onset  . Hypertension Mother   . Hypertension Father     Social History   Tobacco Use  . Smoking status: Never Smoker  . Smokeless tobacco: Never Used  Substance Use Topics  . Alcohol use: Never  . Drug use: Never    Home Medications Prior to Admission medications   Medication Sig Start Date End Date Taking? Authorizing Provider  acetaminophen (TYLENOL) 500 MG tablet Take 1,000 mg by mouth every 6 (six) hours as needed (for pain).     [provider]  albuterol (VENTOLIN HFA) 108 (90 Base) MCG/ACT inhaler Inhale 2 puffs into the lungs every 6 (six) hours as needed for wheezing or shortness of breath. 03/31/19   Junie Spencer, FNP  ALPRAZolam Prudy Feeler) 0.5 MG tablet Take 1 tablet (0.5 mg total) by mouth 3 (three) times daily as needed for anxiety. Patient not taking: Reported on 08/26/2019 09/27/18   Lauraine Rinne., MD  ALPRAZolam Prudy Feeler) 1 MG tablet Take 0.5 mg by mouth 2 (two) times daily as needed for anxiety.  02/18/19   [provider]  amLODipine (NORVASC) 10 MG tablet Take 10 mg by mouth daily.    [provider]  benzonatate (TESSALON) 100 MG capsule Take 1 capsule (100 mg total) by mouth 2 (two) times daily as needed for cough. Patient not taking: Reported on 08/19/2019 08/02/19   Arvilla Market, DO  budesonide-formoterol [facility]) 160-4.5 MCG/ACT inhaler Inhale 2 puffs into the lungs 2 (two) times daily. 08/14/19   Leslye Peer, MD  cholecalciferol (VITAMIN D3) 25 MCG (1000 UT) tablet Take 1,000 Units by mouth daily.    [provider]  CRANBERRY PO Take 1 tablet by mouth 2 (two) times daily.     [provider]  cyclobenzaprine (FLEXERIL) 10 MG tablet Take 1 tablet (10 mg total) by mouth 2 (two) times daily as needed for muscle spasms. 08/26/19   Luevenia Maxin, Mina A, PA-C  diphenhydrAMINE (BENADRYL) 25 MG tablet Take 50 mg by mouth as needed for itching or allergies.     [provider]  Docusate Sodium (STOOL  SOFTENER) 100 MG capsule Take 100 mg by mouth 2 (two) times daily.    [provider]  ferrous sulfate 325 (65 FE) MG tablet Take 1 tablet (325 mg total) by mouth 2 (two) times daily with a meal. Patient not taking: Reported on 08/26/2019 01/04/19   Elease Etienne, MD  fexofenadine (ALLEGRA) 180 MG tablet Take 180 mg by mouth daily.    [provider]  furosemide (LASIX) 40 MG tablet Take 40 mg by mouth daily. 01/14/19   [provider]  gabapentin (NEURONTIN) 300 MG capsule Take 300 mg by mouth 3 (three) times daily as needed (pain).  04/17/19   [provider]  ibuprofen (ADVIL) 600 MG  tablet Take 1 tablet (600 mg total) by mouth every 6 (six) hours as needed. 08/26/19   Michela Pitcher A, PA-C  ipratropium (ATROVENT) 0.02 % nebulizer solution Take 2.5 mLs (0.5 mg total) by nebulization every 6 (six) hours as needed for wheezing or shortness of breath. Patient not taking: Reported on 08/26/2019 08/02/19   Arvilla Market, DO  ipratropium-albuterol (DUONEB) 0.5-2.5 (3) MG/3ML SOLN Take 3 mLs by nebulization every 6 (six) hours as needed. 04/26/19   [provider]  levothyroxine (SYNTHROID, LEVOTHROID) 75 MCG tablet Take 75 mcg by mouth daily before breakfast.    [provider]  meloxicam (MOBIC) 15 MG tablet Take 1 tablet (15 mg total) by mouth daily. Patient taking differently: Take 15 mg by mouth daily as needed for pain.  08/26/19   Felecia Shelling, DPM  mometasone (NASONEX) 50 MCG/ACT nasal spray Place 2 sprays into the nose daily.    [provider]  montelukast (SINGULAIR) 10 MG tablet Take 10 mg by mouth every evening.     [provider]  Multiple Vitamins-Minerals (WOMENS MULTI PO) Take 1 tablet by mouth daily.     [provider]  norethindrone-ethinyl estradiol (MICROGESTIN,JUNEL,LOESTRIN) 1-20 MG-MCG tablet Take 1 tablet by mouth daily.    [provider]  omeprazole (PRILOSEC) 20 MG capsule Take  20 mg by mouth 2 (two) times daily. 07/21/19   [provider]  ondansetron (ZOFRAN-ODT) 4 MG disintegrating tablet Take 4 mg by mouth daily as needed for nausea.  03/05/19   [provider]  PARoxetine (PAXIL) 30 MG tablet TAKE 2 TABLETS(60 MG) BY MOUTH DAILY Patient taking differently: Take 60 mg by mouth daily.  08/07/19   Cottle, Steva Ready., MD  Probiotic Product (PROBIOTIC DAILY PO) Take 1 tablet by mouth daily.    [provider]  Promethazine-Codeine 6.25-10 MG/5ML SOLN Take 5 mLs by mouth every 6 (six) hours as needed (cough).  08/21/19   [provider]  traZODone (DESYREL) 50 MG tablet Take 2 tablets (100 mg total) by mouth at bedtime as needed for sleep. 06/12/19   Cottle, Steva Ready., MD    Allergies    Ciprofloxacin hcl  Review of Systems   Review of Systems Level 5 caveat cannot obtain review of systems as patient is not answering any questions and is currently nonverbal. Physical Exam Updated Vital Signs BP (!) 146/101   Pulse (!) 101   Temp (!) 101.4 F (38.6 C) (Rectal)   Resp 17   SpO2 95%   Physical Exam Constitutional:      Comments: Patient is lying supine on stretcher.  Her color is good.  She does not exhibit respiratory distress.  Mild sinus tachycardia in the low 100s.  Blood pressure 140s over 100.  Oxygen saturation 100%.  No sonorous respirations.  Patient's eyes are closed.  HENT:     Head: Normocephalic and atraumatic.     Mouth/Throat:     Mouth: Mucous membranes are moist.     Pharynx: Oropharynx is clear.  Eyes:     Extraocular Movements: Extraocular movements intact.     Conjunctiva/sclera: Conjunctivae normal.     Pupils: Pupils are equal, round, and reactive to light.     Comments: Patient's eyes are closed.  She is not opening them to command but if I lightly touch her lids or eyelashes there is immediate response.  She does open slightly and then the eyes have a conjugate movement.  When I  mechanically open her  eyelids, her gaze is focused and eye movements are coordinated.  Pupillary responses are brisk and midrange 3 to 6 mm.  Cardiovascular:     Comments: Borderline tachycardia.  No rub murmur gallop. Pulmonary:     Effort: Pulmonary effort is normal.     Breath sounds: Normal breath sounds.  Abdominal:     General: There is no distension.     Palpations: Abdomen is soft.     Tenderness: There is no abdominal tenderness. There is no guarding.  Musculoskeletal:     Cervical back: Neck supple.     Comments: Extremities are in good condition.  No deformities.  Extremities are warm and dry with brisk cap refill.  No peripheral edema.  Calves are soft and nontender.  Skin:    General: Skin is warm and dry.  Neurological:     Comments: Patient is not responding with any verbal interaction.  Although her eyes are closed, she appears to be awake.  She will not follow commands for grip strength but if I move her arms about and then begin to lay them back she assumes control and uses them to the bed.  The same is true for the lower extremities.  I can pick up the lower extremities and put them in position of flexion and extension and is I am extending them and placing them back down she eases the extremities to the bed without allowing them to drop by gravity.  Withdrawal to noxious stimulus of the soles of the feet.  Toes downgoing.     ED Results / Procedures / Treatments   Labs (all labs ordered are listed, but only abnormal results are displayed) Labs Reviewed  COMPREHENSIVE METABOLIC PANEL - Abnormal; Notable for the following components:      Result Value   Sodium 134 (*)    Glucose, Bld 106 (*)    All other components within normal limits  CBC WITH DIFFERENTIAL/PLATELET - Abnormal; Notable for the following components:   WBC 13.8 (*)    Neutro Abs 11.7 (*)    All other components within normal limits  URINALYSIS, ROUTINE W REFLEX MICROSCOPIC - Abnormal; Notable for the following components:    Color, Urine STRAW (*)    Hgb urine dipstick SMALL (*)    Bacteria, UA RARE (*)    All other components within normal limits  BLOOD GAS, ARTERIAL - Abnormal; Notable for the following components:   pO2, Arterial 78.1 (*)    All other components within normal limits  RAPID URINE DRUG SCREEN, HOSP PERFORMED - Abnormal; Notable for the following components:   Opiates POSITIVE (*)    All other components within normal limits  RESPIRATORY PANEL BY RT PCR (FLU A&B, COVID)  CULTURE, BLOOD (ROUTINE X 2)  CULTURE, BLOOD (ROUTINE X 2)  URINE CULTURE  LACTIC ACID, PLASMA  LACTIC ACID, PLASMA  APTT  PROTIME-INR  LIPASE, BLOOD  I-STAT BETA HCG BLOOD, ED ([facility], [facility], [facility] ONLY)    EKG None  Radiology CT Head Wo Contrast  Result Date: 08/27/2019 CLINICAL DATA:  39 year old female with altered mental status. EXAM: CT HEAD WITHOUT CONTRAST TECHNIQUE: Contiguous axial images were obtained from the base of the skull through the vertex without intravenous contrast. COMPARISON:  Head CT dated 08/26/2019. FINDINGS: Brain: The ventricles and sulci appropriate size for patient's age. The gray-white matter discrimination is preserved. There is no acute intracranial hemorrhage. No mass effect or midline shift. No extra-axial fluid collection. Vascular: No  hyperdense vessel or unexpected calcification. Skull: Normal. Negative for fracture or focal lesion. Sinuses/Orbits: No acute finding. Other: None IMPRESSION: Unremarkable noncontrast CT of the brain. Electronically Signed   By: Elgie CollardArash  Radparvar M.D.   On: 08/27/2019 16:35   CT Head Wo Contrast  Result Date: 08/26/2019 CLINICAL DATA:  Syncope, weakness, headache EXAM: CT HEAD WITHOUT CONTRAST TECHNIQUE: Contiguous axial images were obtained from the base of the skull through the vertex without intravenous contrast. COMPARISON:  12/26/2018 FINDINGS: Brain: No acute infarct or hemorrhage. Lateral ventricles and midline structures are unremarkable. No acute  extra-axial fluid collections. No mass effect. Vascular: No hyperdense vessel or unexpected calcification. Skull: Normal. Negative for fracture or focal lesion. Sinuses/Orbits: No acute finding. Other: None IMPRESSION: 1. Stable head CT, no acute process. Electronically Signed   By: Sharlet SalinaMichael  Brown M.D.   On: 08/26/2019 21:04   CT Cervical Spine Wo Contrast  Result Date: 08/26/2019 CLINICAL DATA:  Syncope, fell, diffuse pain EXAM: CT CERVICAL and LUMBAR SPINE WITHOUT CONTRAST TECHNIQUE: Multidetector CT imaging of the cervical and lumbar spine was performed without intravenous contrast. Multiplanar CT image reconstructions were also generated. COMPARISON:  09/04/2018 FINDINGS: CT CERVICAL SPINE FINDINGS Alignment: Alignment is anatomic. Skull base and vertebrae: No acute displaced fractures. Soft tissues and spinal canal: No prevertebral fluid or swelling. No visible canal hematoma. Disc levels: Disc spaces are well preserved. No significant central canal or neural foraminal encroachment. Upper chest: Airway is patent. Visualized portions of the lung apices are clear. Reconstructed images demonstrate no additional findings. CT LUMBAR SPINE FINDINGS Segmentation: There are 6 non-rib-bearing lumbar type vertebral bodies. Alignment: There is been and discectomy at L6/S1 with posterior fusion at that level. Persistent grade 1 anterolisthesis of L6 relative to S1. Vertebrae: No acute displaced fractures. Paraspinal and other soft tissues: Negative. Disc levels: Aside from the postsurgical changes at L6/S1, the remaining intervertebral disc spaces are unremarkable. IMPRESSION: 1. No acute displaced cervical or lumbar spine fracture. 2. There are 6 non-rib-bearing lumbar type vertebral bodies. 3. Postsurgical changes at L6/S1 with posterior fusion at that level. Persistent grade 1 anterolisthesis of L6 relative to S1. Electronically Signed   By: Sharlet SalinaMichael  Brown M.D.   On: 08/26/2019 21:11   CT Lumbar Spine Wo  Contrast  Result Date: 08/26/2019 CLINICAL DATA:  Syncope, fell, diffuse pain EXAM: CT CERVICAL and LUMBAR SPINE WITHOUT CONTRAST TECHNIQUE: Multidetector CT imaging of the cervical and lumbar spine was performed without intravenous contrast. Multiplanar CT image reconstructions were also generated. COMPARISON:  09/04/2018 FINDINGS: CT CERVICAL SPINE FINDINGS Alignment: Alignment is anatomic. Skull base and vertebrae: No acute displaced fractures. Soft tissues and spinal canal: No prevertebral fluid or swelling. No visible canal hematoma. Disc levels: Disc spaces are well preserved. No significant central canal or neural foraminal encroachment. Upper chest: Airway is patent. Visualized portions of the lung apices are clear. Reconstructed images demonstrate no additional findings. CT LUMBAR SPINE FINDINGS Segmentation: There are 6 non-rib-bearing lumbar type vertebral bodies. Alignment: There is been and discectomy at L6/S1 with posterior fusion at that level. Persistent grade 1 anterolisthesis of L6 relative to S1. Vertebrae: No acute displaced fractures. Paraspinal and other soft tissues: Negative. Disc levels: Aside from the postsurgical changes at L6/S1, the remaining intervertebral disc spaces are unremarkable. IMPRESSION: 1. No acute displaced cervical or lumbar spine fracture. 2. There are 6 non-rib-bearing lumbar type vertebral bodies. 3. Postsurgical changes at L6/S1 with posterior fusion at that level. Persistent grade 1 anterolisthesis of L6 relative to S1. Electronically  Signed   By: Sharlet SalinaMichael  Brown M.D.   On: 08/26/2019 21:11   MR LUMBAR SPINE WO CONTRAST  Result Date: 08/27/2019 CLINICAL DATA:  Chronic back pain, weakness EXAM: MRI LUMBAR SPINE WITHOUT CONTRAST TECHNIQUE: Multiplanar, multisequence MR imaging of the lumbar spine was performed. No intravenous contrast was administered. COMPARISON:  CT 08/26/2019, MRI 12/26/2018 FINDINGS: Segmentation: 5 lumbar type vertebral bodies as designated on  prior MRI report of 12/26/2018 with the operative level being labeled as L5-S1. Alignment:  Chronic grade 2 anterolisthesis L5 on S1. Vertebrae: Since the previous MRI, there has been posterior and interbody fusion at L5-S1 with posterior decompression. No fracture, evidence of discitis, or bone lesion. Conus medullaris and cauda equina: Conus extends to the T12-L1 level. Conus and cauda equina appear normal. Paraspinal and other soft tissues: Negative. Disc levels: T12-L1: Unremarkable. L1-L2: Unremarkable. L2-L3: Unremarkable. L3-L4: Unremarkable. L4-L5: Unremarkable disc. Mild epidural lipomatosis, which appears slightly less prominent compared to prior. No foraminal or canal stenosis. L5-S1: Interval fusion and posterior decompression. Evaluation of this level is largely degraded by metallic susceptibility artifact. Bilateral foramina appear to be somewhat improved, although are largely obscured. Epidural lipomatosis is partially visualized. IMPRESSION: 1. Interval L5-S1 posterior and interbody fusion with posterior decompression. Evaluation of this level is degraded by metallic susceptibility artifact. Bilateral L5-S1 foramina appear somewhat improved, although are largely obscured by metallic susceptibility artifact. 2. Epidural lipomatosis at L4-5 and L5-S1, which appears slightly less prominent compared to prior. 3. Remaining vertebral levels are unremarkable without foraminal or canal stenosis. Electronically Signed   By: Duanne GuessNicholas  Plundo D.O.   On: 08/27/2019 13:15   DG Chest Port 1 View  Result Date: 08/27/2019 CLINICAL DATA:  Unresponsive EXAM: PORTABLE CHEST 1 VIEW COMPARISON:  05/21/2019 FINDINGS: Patient rotated minimally left. Midline trachea. Normal heart size for level of inspiration. Mildly degraded exam due to AP portable technique and patient body habitus. No pleural fluid. Low lung volumes with resultant pulmonary interstitial prominence. No lobar consolidation. No overt congestive failure.  IMPRESSION: Low lung volumes, without definite acute disease. Electronically Signed   By: Jeronimo GreavesKyle  Talbot M.D.   On: 08/27/2019 13:51    Procedures Procedures (including critical care time) CRITICAL CARE Performed by: Arby BarretteMarcy Aniyha Tate   Total critical care time: 30  minutes  Critical care time was exclusive of separately billable procedures and treating other patients.  Critical care was necessary to treat or prevent imminent or life-threatening deterioration.  Critical care was time spent personally by me on the following activities: development of treatment plan with patient and/or surrogate as well as nursing, discussions with consultants, evaluation of patient's response to treatment, examination of patient, obtaining history from patient or surrogate, ordering and performing treatments and interventions, ordering and review of laboratory studies, ordering and review of radiographic studies, pulse oximetry and re-evaluation of patient's condition. Medications Ordered in ED Medications  0.9 %  sodium chloride infusion (1,000 mLs Intravenous Bolus 08/27/19 1356)  vancomycin (VANCOREADY) IVPB 2000 mg/400 mL (2,000 mg Intravenous New Bag/Given 08/27/19 1540)  acetaminophen (TYLENOL) suppository 650 mg (650 mg Rectal Given 08/27/19 1356)  ceFEPIme (MAXIPIME) 2 g in sodium chloride 0.9 % 100 mL IVPB (0 g Intravenous Stopped 08/27/19 1545)  metroNIDAZOLE (FLAGYL) IVPB 500 mg (500 mg Intravenous New Bag/Given 08/27/19 1452)  lactated ringers bolus 1,000 mL (1,000 mLs Intravenous Bolus 08/27/19 1540)    ED Course  I have reviewed the triage vital signs and the nursing notes.  Pertinent labs & imaging results that were available  during my care of the patient were reviewed by me and considered in my medical decision making (see chart for details).    MDM Rules/Calculators/A&P                      Patient has an unexplained episode of decreased level of consciousness while getting lumbar MRI today.   Patient was seen yesterday in the ED.  She was ambulatory into MRI with a clear mental status.  Patient does have fever 101.4 and mild tachycardia on arrival.  Sepsis protocol initiated based on fever and tachycardia.  Patient is not hypotensive.  She does not appear somnolent,  there are no sonorous respirations or pathologic respiratory patterns.  CT head obtained due to mental status change.  Seizure is within the differential although I do not suspect seizure activity based on observation of the patient since her arrival to the emergency department. Differential includes post ictal state.  Reportedly, she tested influenza +2 days ago at [facility].  Today's Covid testing and influenza testing are negative.  No clear source for possible infection.  Patient apparently did not complete the lumbar MRI.  Completion of diagnostic studies pending.  Dr. Lynelle Doctor to review completed studies and reassess patient for final disposition. Final Clinical Impression(s) / ED Diagnoses Final diagnoses:  Altered mental status, unspecified altered mental status type  SIRS (systemic inflammatory response syndrome) (HCC)    Rx / DC Orders ED Discharge Orders    None       Arby Barrette, MD 08/30/19 1434

## 2019-08-27 NOTE — Significant Event (Signed)
Rapid Response Event Note  Overview: Time Called: 1240 Arrival Time: 1242 Event Type: Neurologic  Initial Focused Assessment:  Called to MRI due to new onset unresponsiveness post MRI scan. Patient came in for outpatient lumbar spine scan, patient had ambulated onto MRI stretcher but at completion of exam patient was no longer responding to staff. On arrival patient supine with eyes closed. No noted respiratory distress, no seizure history. No response to noxious stimuli. Patient has slight withdrawal from sternal rub but no eye opening. Nystagmus noted when eyes manually opened. Pupils 4 mm, equal, and responsive to light. No patient effort or resistance to gravity. Patient placed on telemetry, pulse oximetry, and BP cuff. SPO2 89% placed on 2L [place]. HR on 5L EKG 120s to 130s, appears to be sinus tach. Patient feels febrile to touch. Ed called to notify of need for bed. Patient slid with no patient effort onto transport stretcher.  AC at bedside calling to notify family of impending transfer to ED. Patient transported to emergency department with house supervisor. Handoff given to ED RN.      Outcome: Transferred (Comment)(Transfered to Res A in ED)  Event End Time: 1256  Carolyn Castro

## 2019-08-27 NOTE — Progress Notes (Signed)
Notified provider and bedside nurse of need to order antibiotics.  

## 2019-08-27 NOTE — ED Triage Notes (Signed)
Pt arrives from MRI. Pt has having an outpatient MRI on lumbar spine and went unresponsive for staff. Pt was noted to have some response to painful stimuli. Per MRI staff: Pt was alert and oriented when arriving to MRI. Pt noted to be febrile and tachycardiac.

## 2019-08-27 NOTE — Progress Notes (Signed)
Pharmacy Antibiotic Note  Carolyn Castro is a 39 y.o. female admitted on 08/27/2019 with syncope, fever.  Pharmacy has been consulted for acyclovir and vancomycin dosing for concern for possible meningitis.  Pt has PMH significant for chronic back pain, obesity, lumbar stenosis with neurogenic claudication. Pt presents with LOC, acute headache, and back pain. Broad spectrum antimicrobial treatment being initiated for possible meningitis.   Today, 08/27/19  WBC 13.8  SCr 0.91, CrCl > 80 mL/min  Lactate 1.2  Tmax 101.4 F  Plan:  Vancomycin 2000 mg LD followed by vancomycin 1250 mg IV q8h  Acyclovir 820 mg (10 mg/kg adj BW) IV q8h  Ceftriaxone 2 g IV q12h per MD  Monitor renal function closely, SCr daily. NaCl running at 125 mL/hr currently  Goal VT 15-20 mcg/mL. Check levels once at steady state    Temp (24hrs), Avg:99.8 F (37.7 C), Min:98.2 F (36.8 C), Max:101.4 F (38.6 C)  Recent Labs  Lab 08/26/19 2015 08/27/19 1308 08/27/19 1540  WBC 8.9 13.8*  --   CREATININE 0.95 0.91  --   LATICACIDVEN  --  1.4 1.2    Estimated Creatinine Clearance: 109.3 mL/min (by C-G formula based on SCr of 0.91 mg/dL).    Allergies  Allergen Reactions  . Ciprofloxacin Hcl Diarrhea and Other (See Comments)    Developed C-Diff    Antimicrobials this admission: vancomycin 2/23 >>  ceftriaxone 2/23 >>  Acyclovir 2/23 >> Cefepime x1 in ED 2/23 Metronidazole x1 in ED 2/23  Dose adjustments this admission:  Microbiology results: 2/23 BCx:  2/23 UCx:    Cindi Carbon, PharmD 08/27/2019 6:20 PM

## 2019-08-27 NOTE — Progress Notes (Signed)
Upon completion of her MRI. Pt became unresponsive and only aroused by deep sternal rub. Pt was very confused and only responded with short sentences. Acute mental status change from when she arrived. Rapid response was then called. Pt assessed by Rapid response team and taken to the ER.

## 2019-08-27 NOTE — Progress Notes (Signed)
A consult was received from an ED physician for cefepime + vancomycin per pharmacy dosing.  The patient's profile has been reviewed for ht/wt/allergies/indication/available labs.    A one time order has been placed for vancomycin 2000 mg IV + cefepime 2 g IV once.    Further antibiotics/pharmacy consults should be ordered by admitting physician if indicated.                       Thank you, Cindi Carbon, PharmD 08/27/2019  2:21 PM

## 2019-08-27 NOTE — ED Notes (Signed)
With pts verbal consent: Carolyn Castro, pts father updated on plan of care.

## 2019-08-27 NOTE — ED Notes (Signed)
Pt transported to CT ?

## 2019-08-27 NOTE — ED Notes (Signed)
Unable to obtain complete triage due to pts status

## 2019-08-28 ENCOUNTER — Inpatient Hospital Stay (HOSPITAL_COMMUNITY): Payer: 59

## 2019-08-28 ENCOUNTER — Observation Stay (HOSPITAL_COMMUNITY): Payer: 59

## 2019-08-28 DIAGNOSIS — R55 Syncope and collapse: Secondary | ICD-10-CM | POA: Diagnosis present

## 2019-08-28 DIAGNOSIS — R4182 Altered mental status, unspecified: Secondary | ICD-10-CM

## 2019-08-28 DIAGNOSIS — Z79899 Other long term (current) drug therapy: Secondary | ICD-10-CM | POA: Diagnosis not present

## 2019-08-28 DIAGNOSIS — R569 Unspecified convulsions: Secondary | ICD-10-CM | POA: Diagnosis present

## 2019-08-28 DIAGNOSIS — R509 Fever, unspecified: Secondary | ICD-10-CM

## 2019-08-28 DIAGNOSIS — R519 Headache, unspecified: Secondary | ICD-10-CM | POA: Diagnosis present

## 2019-08-28 DIAGNOSIS — Z79890 Hormone replacement therapy (postmenopausal): Secondary | ICD-10-CM | POA: Diagnosis not present

## 2019-08-28 DIAGNOSIS — Z20822 Contact with and (suspected) exposure to covid-19: Secondary | ICD-10-CM | POA: Diagnosis present

## 2019-08-28 DIAGNOSIS — Z8249 Family history of ischemic heart disease and other diseases of the circulatory system: Secondary | ICD-10-CM | POA: Diagnosis not present

## 2019-08-28 DIAGNOSIS — Z791 Long term (current) use of non-steroidal anti-inflammatories (NSAID): Secondary | ICD-10-CM | POA: Diagnosis not present

## 2019-08-28 DIAGNOSIS — Z7951 Long term (current) use of inhaled steroids: Secondary | ICD-10-CM | POA: Diagnosis not present

## 2019-08-28 DIAGNOSIS — H5500 Unspecified nystagmus: Secondary | ICD-10-CM | POA: Diagnosis present

## 2019-08-28 DIAGNOSIS — I10 Essential (primary) hypertension: Secondary | ICD-10-CM | POA: Diagnosis present

## 2019-08-28 DIAGNOSIS — M25552 Pain in left hip: Secondary | ICD-10-CM | POA: Diagnosis present

## 2019-08-28 DIAGNOSIS — Z981 Arthrodesis status: Secondary | ICD-10-CM | POA: Diagnosis not present

## 2019-08-28 DIAGNOSIS — R6510 Systemic inflammatory response syndrome (SIRS) of non-infectious origin without acute organ dysfunction: Secondary | ICD-10-CM | POA: Diagnosis present

## 2019-08-28 DIAGNOSIS — M25551 Pain in right hip: Secondary | ICD-10-CM | POA: Diagnosis present

## 2019-08-28 DIAGNOSIS — J029 Acute pharyngitis, unspecified: Secondary | ICD-10-CM | POA: Diagnosis present

## 2019-08-28 DIAGNOSIS — W19XXXA Unspecified fall, initial encounter: Secondary | ICD-10-CM | POA: Diagnosis present

## 2019-08-28 DIAGNOSIS — E039 Hypothyroidism, unspecified: Secondary | ICD-10-CM | POA: Diagnosis present

## 2019-08-28 DIAGNOSIS — J9611 Chronic respiratory failure with hypoxia: Secondary | ICD-10-CM | POA: Diagnosis present

## 2019-08-28 DIAGNOSIS — R000 Tachycardia, unspecified: Secondary | ICD-10-CM | POA: Diagnosis present

## 2019-08-28 DIAGNOSIS — K219 Gastro-esophageal reflux disease without esophagitis: Secondary | ICD-10-CM | POA: Diagnosis present

## 2019-08-28 DIAGNOSIS — J45909 Unspecified asthma, uncomplicated: Secondary | ICD-10-CM | POA: Diagnosis present

## 2019-08-28 DIAGNOSIS — Z6841 Body Mass Index (BMI) 40.0 and over, adult: Secondary | ICD-10-CM | POA: Diagnosis not present

## 2019-08-28 DIAGNOSIS — Z881 Allergy status to other antibiotic agents status: Secondary | ICD-10-CM | POA: Diagnosis not present

## 2019-08-28 LAB — BASIC METABOLIC PANEL
Anion gap: 8 (ref 5–15)
BUN: 8 mg/dL (ref 6–20)
CO2: 24 mmol/L (ref 22–32)
Calcium: 8.2 mg/dL — ABNORMAL LOW (ref 8.9–10.3)
Chloride: 107 mmol/L (ref 98–111)
Creatinine, Ser: 0.84 mg/dL (ref 0.44–1.00)
GFR calc Af Amer: >60 mL/min (ref >60)
GFR calc non Af Amer: >60 mL/min (ref >60)
Glucose, Bld: 97 mg/dL (ref 70–99)
Potassium: 3.5 mmol/L (ref 3.5–5.1)
Sodium: 139 mmol/L (ref 135–145)

## 2019-08-28 LAB — CSF CELL COUNT WITH DIFFERENTIAL
RBC Count, CSF: 7 /mm3 — ABNORMAL HIGH
Tube #: 3
WBC, CSF: 0 /mm3 (ref 0–5)

## 2019-08-28 LAB — CBC
HCT: 37.9 % (ref 36.0–46.0)
Hemoglobin: 12.3 g/dL (ref 12.0–15.0)
MCH: 29.1 pg (ref 26.0–34.0)
MCHC: 32.5 g/dL (ref 30.0–36.0)
MCV: 89.6 fL (ref 80.0–100.0)
Platelets: 293 10*3/uL (ref 150–400)
RBC: 4.23 MIL/uL (ref 3.87–5.11)
RDW: 13.5 % (ref 11.5–15.5)
WBC: 8.3 10*3/uL (ref 4.0–10.5)
nRBC: 0 % (ref 0.0–0.2)

## 2019-08-28 LAB — SARS CORONAVIRUS 2 BY RT PCR (DIASORIN): SARS Coronavirus 2: NEGATIVE

## 2019-08-28 LAB — PROTEIN, CSF: Total  Protein, CSF: 113 mg/dL — ABNORMAL HIGH (ref 15–45)

## 2019-08-28 LAB — TSH: TSH: 4.201 u[IU]/mL (ref 0.350–4.500)

## 2019-08-28 LAB — HEMOGLOBIN A1C
Hgb A1c MFr Bld: 5.6 % (ref 4.8–5.6)
Mean Plasma Glucose: 114.02 mg/dL

## 2019-08-28 LAB — GLUCOSE, CSF: Glucose, CSF: 54 mg/dL (ref 40–70)

## 2019-08-28 LAB — ECHOCARDIOGRAM COMPLETE

## 2019-08-28 MED ORDER — DEXTROSE 5 % IV SOLN
10.0000 mg/kg | Freq: Three times a day (TID) | INTRAVENOUS | Status: DC
  Administered 2019-08-28: 15:00:00 820 mg via INTRAVENOUS
  Filled 2019-08-28 (×3): qty 16.4

## 2019-08-28 MED ORDER — IPRATROPIUM-ALBUTEROL 0.5-2.5 (3) MG/3ML IN SOLN
3.0000 mL | Freq: Four times a day (QID) | RESPIRATORY_TRACT | Status: AC
  Administered 2019-08-28 (×2): 3 mL via RESPIRATORY_TRACT
  Filled 2019-08-28 (×3): qty 3

## 2019-08-28 MED ORDER — LIDOCAINE HCL (PF) 1 % IJ SOLN
5.0000 mL | Freq: Once | INTRAMUSCULAR | Status: AC
  Administered 2019-08-28: 5 mL via INTRADERMAL

## 2019-08-28 NOTE — Procedures (Signed)
Technically successful L4-L5 fluoro-guided LP. No immediate post-procedure complication.   Opening pressure 21 cm H20.  10 mL CSF obtained for lab studies.

## 2019-08-28 NOTE — Progress Notes (Signed)
  Echocardiogram 2D Echocardiogram has been performed.  Carolyn Castro 08/28/2019, 3:39 PM

## 2019-08-28 NOTE — Progress Notes (Deleted)
Covid PCR and Resp panel negative, third Covid test cancelled.

## 2019-08-28 NOTE — Plan of Care (Addendum)
Same-day follow-up note  Patient seen and examined.  Reports 8 out of 10 headache. Examination consistent with giveaway weakness of the left and right lower extremity with Hoffmann sign positive.  Brisk lower extremity reflexes. Rest of the exam unchanged from Dr. Shelbie Hutching exam.  No neck stiffness.  Less likely meningitis.  Pending EEG.  Low suspicion for seizures.  Per chart review, received spinal tap under IR.  We will follow up the results.  -- Milon Dikes, MD [facility] Pager: [phone] If 7pm to 7am, please call on call as listed on AMION.   Addendum 1539 hrs CSF results   Protein likely elevated due to prior spinal surgeries. CSF looks noninfectious. Can discontinue antivirals and antibiotics  MRI brain reviewed-unremarkable  Differentials remain as episode of unresponsiveness of unknown etiology versus complex migraine versus psychogenic etiology.  No clear localization of the exam. Management of fevers per primary team although she has not spiked more fevers after that reading.  Had been feeling poorly before-likely a nonspecific viral illness could also be in differentials.  Please call neurology as needed  -- Milon Dikes, MD [facility] Pager: [phone] If 7pm to 7am, please call on call as listed on AMION.

## 2019-08-28 NOTE — Progress Notes (Signed)
Progress Note    Carolyn Castro  YPP:[id number] DOB: 1981-02-16  DOA: 08/27/2019 PCP: Maurice Small, MD    Brief Narrative:   Chief complaint: syncope  Medical records reviewed and are as summarized below:  Carolyn Castro is an 39 y.o. female past medical history significant for asthma, morbid obesity, hypertension, hypothyroidism, GERD admitted after syncopal/unresponsive episode and outpatient MRI.  Reportedly she was nonresponsive to toxic stimuli but vitals were stable.  Presented with a fever headache questionable nystagmus.  Concern for meningitis acyclovir, Rocephin and bank started.  Lumbar puncture performed today per interventional radiology  Assessment/Plan:   Principal Problem:   Syncope Active Problems:   Fever   Hypothyroidism   Asthma   Obesity, Class III, BMI 40-49.9 (morbid obesity) (HCC)   Essential hypertension   GERD (gastroesophageal reflux disease)   Flu-like symptoms   Chronic hypoxemic respiratory failure ([place])  #1.  Syncope/episode of unresponsiveness.  Awake this morning reporting headache 4/10, she is afebrile nontoxic-appearing, alert and oriented.  Lumbar puncture performed by interventional radiology.  Evaluated by neurology who opine neuro exam no evidence of encephalopathy, no clear localization on exam.  CT the head was unremarkable.  CT of the lumbar spine reveals postsurgical changes, CT of C-spine no acute displaced cervical fracture -Follow results of LP -mobilize when able  #2.  Flulike symptoms.  Respiratory panel negative.  Influenza a and B, rapid Covid PCR negative.  SARS coronavirus 2  Negative.  Cough from asthma -Gentle IV fluids -Supportive therapy  #3.  Hypothyroidism.  Home medications include Synthroid.  TSH within the limits of normal -Continue home meds  4.  Asthma.  Currently compensated.  Reports she has needed her as needed home nebulizers more lately.  Home medications include as needed nebs and  inhalers. -Continue home meds -Scheduled nebs for 24 hours -Monitor  #5.  GERD.  Stable at baseline. -Continue PPI  #6.  Hypertension.  Controlled.  No antihypertensive meds on home list. -Monitor  #7.  Morbid obesity. BMI greater than 40. -Outpatient follow-up    Family Communication/Anticipated D/C date and plan/Code Status   DVT prophylaxis: Lovenox ordered. Code Status: Full Code.  Family Communication: patient Disposition Plan: home hopefully tomorrow   Medical Consultants:    [facility]   Anti-Infectives:    Rocephin  Acyclovir  vanc  Subjective:   Lying in bed watching TV.  Ports headache "a little better".  Complains of some intermittent nausea  Objective:    Vitals:   08/27/19 2300 08/28/19 0344 08/28/19 0700 08/28/19 0841  BP: 116/75 (!) 142/78 (!) 150/75   Pulse: (!) 115 97    Resp: 16 (!) 23    Temp:  98 F (36.7 C) (!) 97.4 F (36.3 C)   TempSrc:  Oral Oral   SpO2: 97% 97%  96%    Intake/Output Summary (Last 24 hours) at 08/28/2019 1113 Last data filed at 08/28/2019 1000 Gross per 24 hour  Intake --  Output 2000 ml  Net -2000 ml   There were no vitals filed for this visit.  Exam: General: Obese awake no acute distress CV: Regular rate and rhythm no murmur gallop or rub no lower extremity edema Respiratory mild increased work of breathing with conversation.  Breath sounds are distant and slightly coarse but I hear no wheezing no crackles Abdomen: Obese soft positive bowel sounds throughout nontender to palpation no guarding or rebounding Musculoskeletal: Joints without swelling/erythema full range of motion moving all extremities spontaneously  Neuro: Alert and oriented x3 speech clear facial symmetry cranial nerves II through XII grossly intact no nuchal rigidity no nystagmus  Data Reviewed:   I have personally reviewed following labs and imaging studies:  Labs: Labs show the following:   Basic Metabolic Panel: Recent  Labs  Lab 08/26/19 2015 08/26/19 2015 08/27/19 1308 08/28/19 0623  NA 138  --  134* 139  K 3.9   < > 4.1 3.5  CL 106  --  100 107  CO2 20*  --  23 24  GLUCOSE 96  --  106* 97  BUN 13  --  14 8  CREATININE 0.95  --  0.91 0.84  CALCIUM 8.4*  --  9.0 8.2*   < > = values in this interval not displayed.   GFR Estimated Creatinine Clearance: 118.4 mL/min (by C-G formula based on SCr of 0.84 mg/dL). Liver Function Tests: Recent Labs  Lab 08/26/19 2015 08/27/19 1308  AST 24 36  ALT 27 34  ALKPHOS 60 69  BILITOT 0.4 0.7  PROT 6.2* 7.7  ALBUMIN 3.1* 3.6   Recent Labs  Lab 08/27/19 1308  LIPASE 23   No results for input(s): AMMONIA in the last 168 hours. Coagulation profile Recent Labs  Lab 08/27/19 1308  INR 0.9    CBC: Recent Labs  Lab 08/26/19 2015 08/27/19 1308 08/28/19 0623  WBC 8.9 13.8* 8.3  NEUTROABS 5.5 11.7*  --   HGB 13.1 13.6 12.3  HCT 40.8 41.7 37.9  MCV 91.3 90.8 89.6  PLT 311 316 293   Cardiac Enzymes: No results for input(s): CKTOTAL, CKMB, CKMBINDEX, TROPONINI in the last 168 hours. BNP (last 3 results) No results for input(s): PROBNP in the last 8760 hours. CBG: No results for input(s): GLUCAP in the last 168 hours. D-Dimer: No results for input(s): DDIMER in the last 72 hours. Hgb A1c: No results for input(s): HGBA1C in the last 72 hours. Lipid Profile: No results for input(s): CHOL, HDL, LDLCALC, TRIG, CHOLHDL, LDLDIRECT in the last 72 hours. Thyroid function studies: Recent Labs    08/27/19 2322  TSH 4.201   Anemia work up: No results for input(s): VITAMINB12, FOLATE, FERRITIN, TIBC, IRON, RETICCTPCT in the last 72 hours. Sepsis Labs: Recent Labs  Lab 08/26/19 2015 08/27/19 1308 08/27/19 1540 08/28/19 0623  WBC 8.9 13.8*  --  8.3  LATICACIDVEN  --  1.4 1.2  --     Microbiology Recent Results (from the past 240 hour(s))  Respiratory Panel by RT PCR (Flu A&B, Covid) - Nasopharyngeal Swab     Status: None   Collection  Time: 08/27/19  1:16 PM   Specimen: Nasopharyngeal Swab  Result Value Ref Range Status   SARS Coronavirus 2 by RT PCR NEGATIVE NEGATIVE Final    Comment: (NOTE) SARS-CoV-2 target nucleic acids are NOT DETECTED. The SARS-CoV-2 RNA is generally detectable in upper respiratoy specimens during the acute phase of infection. The lowest concentration of SARS-CoV-2 viral copies this assay can detect is 131 copies/mL. A negative result does not preclude SARS-Cov-2 infection and should not be used as the sole basis for treatment or other patient management decisions. A negative result may occur with  improper specimen collection/handling, submission of specimen other than nasopharyngeal swab, presence of viral mutation(s) within the areas targeted by this assay, and inadequate number of viral copies (<131 copies/mL). A negative result must be combined with clinical observations, patient history, and epidemiological information. The expected result is Negative. Fact Sheet for Patients:  https://www.moore.com/ Fact Sheet for Healthcare Providers:  https://www.young.biz/ This test is not yet ap proved or cleared by the [place] FDA and  has been authorized for detection and/or diagnosis of SARS-CoV-2 by FDA under an Emergency Use Authorization (EUA). This EUA will remain  in effect (meaning this test can be used) for the duration of the COVID-19 declaration under Section 564(b)(1) of the Act, 21 U.S.C. section 360bbb-3(b)(1), unless the authorization is terminated or revoked sooner.    Influenza A by PCR NEGATIVE NEGATIVE Final   Influenza B by PCR NEGATIVE NEGATIVE Final    Comment: (NOTE) The Xpert Xpress SARS-CoV-2/FLU/RSV assay is intended as an aid in  the diagnosis of influenza from Nasopharyngeal swab specimens and  should not be used as a sole basis for treatment. Nasal washings and  aspirates are unacceptable for Xpert Xpress  SARS-CoV-2/FLU/RSV  testing. Fact Sheet for Patients: https://www.moore.com/ Fact Sheet for Healthcare Providers: https://www.young.biz/ This test is not yet approved or cleared by the [place] FDA and  has been authorized for detection and/or diagnosis of SARS-CoV-2 by  FDA under an Emergency Use Authorization (EUA). This EUA will remain  in effect (meaning this test can be used) for the duration of the  Covid-19 declaration under Section 564(b)(1) of the Act, 21  U.S.C. section 360bbb-3(b)(1), unless the authorization is  terminated or revoked. Performed at [facility], [address] [place], [place] [address]   SARS Coronavirus 2 by RT PCR     Status: None   Collection Time: 08/28/19  1:55 AM  Result Value Ref Range Status   SARS Coronavirus 2 NEGATIVE NEGATIVE Final    Comment: (NOTE) Result indicates the ABSENCE of SARS-CoV-2 RNA in the patient specimen.  The lowest concentration of SARS-CoV-2 viral copies this assay can detect in nasopharyngeal swab specimens is 500 copies / mL.  A negative result does not preclude SARS-CoV-2 infection and should not be used as the sole basis for patient management decisions. A negative result may occur with improper specimen collection / handling, submission of a specimen other than nasopharyngeal swab, presence of viral mutation(s) within the areas targeted by this assay, and inadequate number of viral copies (<500 copies / mL) present.  Negative results must be combined with clinical observations, patient history, and epidemiological information.  The expected result is NEGATIVE.  Patient Fact Sheet:  https://wong-henderson.biz/   Provider Fact Sheet:  CheapJackpot.at   This test is not yet approved or cleared by the [place] FDA and  has been authorized for  detection and/or diagnosis of SARS-CoV-2 by FDA under an Emergency  Use Authorization (EUA).  This EUA will remain in effect (meaning this test can be used) for the duration of  the COVID-19 declaration under Section 564(b)(1) of the Act, 21 U.S.C. section 360bbb-3(b)(1), unless the authorization is terminated or revoked sooner Performed at [facility], [address] [place], [place] [address]     Procedures and diagnostic studies:  CT Head Wo Contrast  Result Date: 08/27/2019 CLINICAL DATA:  38 year old female with altered mental status. EXAM: CT HEAD WITHOUT CONTRAST TECHNIQUE: Contiguous axial images were obtained from the base of the skull through the vertex without intravenous contrast. COMPARISON:  Head CT dated 08/26/2019. FINDINGS: Brain: The ventricles and sulci appropriate size for patient's age. The gray-white matter discrimination is preserved. There is no acute intracranial hemorrhage. No mass effect or midline shift. No extra-axial fluid collection. Vascular: No hyperdense vessel or unexpected calcification. Skull: Normal.  Negative for fracture or focal lesion. Sinuses/Orbits: No acute finding. Other: None IMPRESSION: Unremarkable noncontrast CT of the brain. Electronically Signed   By: Elgie Collard M.D.   On: 08/27/2019 16:35   CT Head Wo Contrast  Result Date: 08/26/2019 CLINICAL DATA:  Syncope, weakness, headache EXAM: CT HEAD WITHOUT CONTRAST TECHNIQUE: Contiguous axial images were obtained from the base of the skull through the vertex without intravenous contrast. COMPARISON:  12/26/2018 FINDINGS: Brain: No acute infarct or hemorrhage. Lateral ventricles and midline structures are unremarkable. No acute extra-axial fluid collections. No mass effect. Vascular: No hyperdense vessel or unexpected calcification. Skull: Normal. Negative for fracture or focal lesion. Sinuses/Orbits: No acute finding. Other: None IMPRESSION: 1. Stable head CT, no acute process. Electronically Signed   By: Sharlet Salina M.D.   On: 08/26/2019 21:04   CT  Cervical Spine Wo Contrast  Result Date: 08/26/2019 CLINICAL DATA:  Syncope, fell, diffuse pain EXAM: CT CERVICAL and LUMBAR SPINE WITHOUT CONTRAST TECHNIQUE: Multidetector CT imaging of the cervical and lumbar spine was performed without intravenous contrast. Multiplanar CT image reconstructions were also generated. COMPARISON:  09/04/2018 FINDINGS: CT CERVICAL SPINE FINDINGS Alignment: Alignment is anatomic. Skull base and vertebrae: No acute displaced fractures. Soft tissues and spinal canal: No prevertebral fluid or swelling. No visible canal hematoma. Disc levels: Disc spaces are well preserved. No significant central canal or neural foraminal encroachment. Upper chest: Airway is patent. Visualized portions of the lung apices are clear. Reconstructed images demonstrate no additional findings. CT LUMBAR SPINE FINDINGS Segmentation: There are 6 non-rib-bearing lumbar type vertebral bodies. Alignment: There is been and discectomy at L6/S1 with posterior fusion at that level. Persistent grade 1 anterolisthesis of L6 relative to S1. Vertebrae: No acute displaced fractures. Paraspinal and other soft tissues: Negative. Disc levels: Aside from the postsurgical changes at L6/S1, the remaining intervertebral disc spaces are unremarkable. IMPRESSION: 1. No acute displaced cervical or lumbar spine fracture. 2. There are 6 non-rib-bearing lumbar type vertebral bodies. 3. Postsurgical changes at L6/S1 with posterior fusion at that level. Persistent grade 1 anterolisthesis of L6 relative to S1. Electronically Signed   By: Sharlet Salina M.D.   On: 08/26/2019 21:11   CT Lumbar Spine Wo Contrast  Result Date: 08/26/2019 CLINICAL DATA:  Syncope, fell, diffuse pain EXAM: CT CERVICAL and LUMBAR SPINE WITHOUT CONTRAST TECHNIQUE: Multidetector CT imaging of the cervical and lumbar spine was performed without intravenous contrast. Multiplanar CT image reconstructions were also generated. COMPARISON:  09/04/2018 FINDINGS: CT  CERVICAL SPINE FINDINGS Alignment: Alignment is anatomic. Skull base and vertebrae: No acute displaced fractures. Soft tissues and spinal canal: No prevertebral fluid or swelling. No visible canal hematoma. Disc levels: Disc spaces are well preserved. No significant central canal or neural foraminal encroachment. Upper chest: Airway is patent. Visualized portions of the lung apices are clear. Reconstructed images demonstrate no additional findings. CT LUMBAR SPINE FINDINGS Segmentation: There are 6 non-rib-bearing lumbar type vertebral bodies. Alignment: There is been and discectomy at L6/S1 with posterior fusion at that level. Persistent grade 1 anterolisthesis of L6 relative to S1. Vertebrae: No acute displaced fractures. Paraspinal and other soft tissues: Negative. Disc levels: Aside from the postsurgical changes at L6/S1, the remaining intervertebral disc spaces are unremarkable. IMPRESSION: 1. No acute displaced cervical or lumbar spine fracture. 2. There are 6 non-rib-bearing lumbar type vertebral bodies. 3. Postsurgical changes at L6/S1 with posterior fusion at that level. Persistent grade 1 anterolisthesis of L6 relative to S1. Electronically Signed   By: Sharlet Salina  M.D.   On: 08/26/2019 21:11   MR LUMBAR SPINE WO CONTRAST  Result Date: 08/27/2019 CLINICAL DATA:  Chronic back pain, weakness EXAM: MRI LUMBAR SPINE WITHOUT CONTRAST TECHNIQUE: Multiplanar, multisequence MR imaging of the lumbar spine was performed. No intravenous contrast was administered. COMPARISON:  CT 08/26/2019, MRI 12/26/2018 FINDINGS: Segmentation: 5 lumbar type vertebral bodies as designated on prior MRI report of 12/26/2018 with the operative level being labeled as L5-S1. Alignment:  Chronic grade 2 anterolisthesis L5 on S1. Vertebrae: Since the previous MRI, there has been posterior and interbody fusion at L5-S1 with posterior decompression. No fracture, evidence of discitis, or bone lesion. Conus medullaris and cauda equina:  Conus extends to the T12-L1 level. Conus and cauda equina appear normal. Paraspinal and other soft tissues: Negative. Disc levels: T12-L1: Unremarkable. L1-L2: Unremarkable. L2-L3: Unremarkable. L3-L4: Unremarkable. L4-L5: Unremarkable disc. Mild epidural lipomatosis, which appears slightly less prominent compared to prior. No foraminal or canal stenosis. L5-S1: Interval fusion and posterior decompression. Evaluation of this level is largely degraded by metallic susceptibility artifact. Bilateral foramina appear to be somewhat improved, although are largely obscured. Epidural lipomatosis is partially visualized. IMPRESSION: 1. Interval L5-S1 posterior and interbody fusion with posterior decompression. Evaluation of this level is degraded by metallic susceptibility artifact. Bilateral L5-S1 foramina appear somewhat improved, although are largely obscured by metallic susceptibility artifact. 2. Epidural lipomatosis at L4-5 and L5-S1, which appears slightly less prominent compared to prior. 3. Remaining vertebral levels are unremarkable without foraminal or canal stenosis. Electronically Signed   By: Duanne GuessNicholas  Plundo D.O.   On: 08/27/2019 13:15   DG Chest Port 1 View  Result Date: 08/27/2019 CLINICAL DATA:  Unresponsive EXAM: PORTABLE CHEST 1 VIEW COMPARISON:  05/21/2019 FINDINGS: Patient rotated minimally left. Midline trachea. Normal heart size for level of inspiration. Mildly degraded exam due to AP portable technique and patient body habitus. No pleural fluid. Low lung volumes with resultant pulmonary interstitial prominence. No lobar consolidation. No overt congestive failure. IMPRESSION: Low lung volumes, without definite acute disease. Electronically Signed   By: Jeronimo GreavesKyle  Talbot M.D.   On: 08/27/2019 13:51   DG FL GUIDED LUMBAR PUNCTURE  Result Date: 08/28/2019 CLINICAL DATA:  Seizure. EXAM: DIAGNOSTIC LUMBAR PUNCTURE UNDER FLUOROSCOPIC GUIDANCE FLUOROSCOPY TIME:  Fluoroscopy Time:  30 seconds Radiation  Exposure Index (if provided by the fluoroscopic device): 15.90 mGy Number of Acquired Spot Images: 1 PROCEDURE: Informed consent was obtained from the patient prior to the procedure, including potential complications of headache, allergy, and pain. With the patient prone, the lower back was prepped with Betadine. 1% Lidocaine was used for local anesthesia. There are 6 non-rib-bearing lumbar type vertebral bodies. For the purposes of this examination the lowest well-formed intervertebral disc is designated L6-S1. Lumbar puncture was performed at the L4-L5 level using a 20 gauge needle with return of clear CSF with an opening pressure of 21 cm water. 10 ml of CSF were obtained for laboratory studies. The patient tolerated the procedure well and there were no apparent complications. IMPRESSION: Technically successful fluoroscopic guided L4-L5 lumbar puncture. No immediate postprocedure complication. Opening pressure 21 cm water. 10 mL of CSF obtained for laboratory studies. Electronically Signed   By: Jackey LogeKyle  Golden DO   On: 08/28/2019 10:54    Medications:   . amLODipine  10 mg Oral Daily  . fluticasone  2 spray Each Nare Daily  . furosemide  40 mg Oral Daily  . ipratropium-albuterol  3 mL Nebulization Q6H  . levothyroxine  75 mcg Oral Q0600  .  loratadine  10 mg Oral Daily  . mometasone-formoterol  2 puff Inhalation BID  . montelukast  10 mg Oral QPM  . multivitamin with minerals  1 tablet Oral Daily  . norethindrone-ethinyl estradiol  1 tablet Oral Daily  . pantoprazole  40 mg Oral Daily  . PARoxetine  60 mg Oral Daily   Continuous Infusions: . sodium chloride    . acyclovir 820 mg (08/28/19 0657)  . cefTRIAXone (ROCEPHIN)  IV Stopped (08/27/19 2216)  . vancomycin 1,250 mg (08/28/19 0909)     LOS: 1 day   Gwenyth Bender NP  [facility]   How to contact the [facility] Attending or Consulting provider 7A - 7P or covering provider during after hours 7P -7A, for this patient?  1. Check the  care team in [facility] and look for a) attending/consulting [facility] provider listed and b) the [facility] team listed 2. Log into [email or web address] and use [facility] universal password to access. If you do not have the password, please contact the hospital operator. 3. Locate the [facility] provider you are looking for under [facility] and page to a number that you can be directly reached. 4. If you still have difficulty reaching the provider, please page the [facility] (Director on Call) for the Hospitalists listed on amion for assistance.  08/28/2019, 11:13 AM

## 2019-08-28 NOTE — Progress Notes (Signed)
  Echocardiogram 2D Echocardiogram was attempted but patient was in MRI. We will try again later.   Tye Savoy 08/28/2019, 11:09 AM

## 2019-08-28 NOTE — Consult Note (Addendum)
NEURO HOSPITALIST CONSULT NOTE   Requestig physician: Dr. Eliseo Squires  Reason for Consult: Headache and BLE thigh and hip pain with weakness  History obtained from:  Patient and Chart    HPI:                                                                                                                                          Carolyn Castro is an 39 y.o. female who presents in transfer from [facility] for further evaluation of headache and  BLE thigh and hip pain with weakness, after an episode of unresponsiveness following a lumbar spine MRI which was obtained by her Neurosurgeon to evaluate BLE weakness. On presentation to the [facility] ED she was confused with a 10/10 headache. She was transferred to 3W at [facility] from the [facility] ED. On Neurology evaluation here at [facility], the confusion has resolved, with headache pain now decreased to 4/10.    Hospitalist HPI has been reviewed: "Carolyn Castro is a 39 y.o. female with past medical history significant for hypothyroidism, asthma, morbid obesity, hypertension, GERD.  She had a syncopal event yesterday and presented to the emergency department and was discharged home after a fairly normal work-up.  This morning she was scheduled for an MRI of her lumbar spine as ordered by her neurosurgeon as an outpatient for bilateral lower extremity weakness.  At the completion of her MRI she was noticed to be non-responsive.  She was not noticed to be in respiratory distress, per report there was no response to noxious stimuli.  She was noted to have nystagmus at that time, pupils were equal round and reactive to light per report.  She was then sent to the ED for evaluation.  In the ED labs are noted to be fairly unremarkable, WBC count was 13.8, she had a negative chest x-ray and a negative urine analysis.  CT scan of the head is unremarkable for acute findings.  UDS is positive for opiates and I do not see any on her home medication list.  She did have a temperature of  101.4 on arrival.  At the time of my evaluation she is alert and oriented.  She tells me that last Saturday at [facility] she was diagnosed with influenza, was not given Tamiflu.  Since then she has been having headaches, fevers, body aches, sore throat, cough and mild shortness of breath above her baseline.  Influenza A and B and rapid Covid PCR test are negative.  Admission has been requested for further evaluation and management."  Past Medical History:  Diagnosis Date  . Anxiety   . Asthma   . Back pain   . Constipation   . Fatigue   . Hypertension   . Hypothyroid   . Hypothyroidism  Past Surgical History:  Procedure Laterality Date  . BACK SURGERY    . COLONOSCOPY    . ESOPHAGOGASTRODUODENOSCOPY (EGD) WITH PROPOFOL N/A 01/28/2019   Procedure: ESOPHAGOGASTRODUODENOSCOPY (EGD) WITH PROPOFOL;  Surgeon: Carman Ching, MD;  Location: [facility] ENDOSCOPY;  Service: Endoscopy;  Laterality: N/A;  . LAPAROSCOPIC OVARIAN CYSTECTOMY Left 06/29/2018   Procedure: LAPAROSCOPIC OVARIAN CYSTECTOMY;  Surgeon: Gerald Leitz, MD;  Location:  SURGERY CENTER;  Service: Gynecology;  Laterality: Left;  . TONSILLECTOMY    . WISDOM TOOTH EXTRACTION      Family History  Problem Relation Age of Onset  . Hypertension Mother   . Hypertension Father               Social History:  reports that she has never smoked. She has never used smokeless tobacco. She reports that she does not drink alcohol or use drugs.  Allergies  Allergen Reactions  . Ciprofloxacin Hcl Diarrhea and Other (See Comments)    Developed C-Diff    MEDICATIONS:                                                                                                                     Scheduled: . amLODipine  10 mg Oral Daily  . fluticasone  2 spray Each Nare Daily  . furosemide  40 mg Oral Daily  . levothyroxine  75 mcg Oral Q0600  . loratadine  10 mg Oral Daily  . mometasone-formoterol  2 puff Inhalation BID  . montelukast   10 mg Oral QPM  . multivitamin with minerals  1 tablet Oral Daily  . norethindrone-ethinyl estradiol  1 tablet Oral Daily  . pantoprazole  40 mg Oral Daily  . PARoxetine  60 mg Oral Daily   Continuous: . sodium chloride 75 mL/hr at 08/27/19 2320  . sodium chloride    . acyclovir Stopped (08/27/19 2216)  . cefTRIAXone (ROCEPHIN)  IV Stopped (08/27/19 2216)  . vancomycin 1,250 mg (08/28/19 0040)     ROS:                                                                                                                                       Denies CP, SOB, upper extremity weakness or numbness, current vision changes, dysphasia, confusion, or abdominal pain. Has had episodes of bilateral vision blurring and seeing spots in the past, but none currently. Other symptoms as per HPI with comprehensive ROS  otherwise negative.    Blood pressure (!) 142/78, pulse 97, temperature 98 F (36.7 C), temperature source Oral, resp. rate (!) 23, SpO2 97 %.   General Examination:                                                                                                       Physical Exam  General: Morbidly obese HEENT-  Cook/AT. No nuchal rigidity in flexion or rotation. No scalp tenderness.    Lungs- Respirations unlabored Extremities- No edema  Neurological Examination Mental Status: Awake and alert. Fully oriented. Speech fluent without evidence of aphasia.  Able to follow all commands without difficulty. Cranial Nerves: II: Visual fields intact with no extinction to DSS. PERRL.   III,IV, VI: No ptosis. EOMI.  V,VII: No facial droop. Cool object subjectively feels warm bilaterally.  VIII: hearing intact to voice IX,X: No hypophonia XI: Symmetric shoulder shrug XII: midline tongue extension Motor: Upper extremities: BUE 4+/5 proximal and distal with inconsistent effort. No pronator drift. Normal dexterity. Normal tone.  Lower extremities: Poor/inconsistent effort. Does not lift BLE  antigravity at thighs. Giveway weakness when testing knee extension, ADF and APF with maximum elicitable strength rated as 4/5 bilaterally. Tone is normal.  Sensory: Temp and light touch intact in BUE and BLE. No extinction.  Deep Tendon Reflexes: 1+ bilateral biceps and brachioradialis. 2+ bilateral patellae and achilles. Toes downgoing.  Cerebellar: No ataxia with FNF bilaterally Gait: Deferred   Lab Results: Basic Metabolic Panel: Recent Labs  Lab 08/26/19 2015 08/27/19 1308  NA 138 134*  K 3.9 4.1  CL 106 100  CO2 20* 23  GLUCOSE 96 106*  BUN 13 14  CREATININE 0.95 0.91  CALCIUM 8.4* 9.0    CBC: Recent Labs  Lab 08/26/19 2015 08/27/19 1308  WBC 8.9 13.8*  NEUTROABS 5.5 11.7*  HGB 13.1 13.6  HCT 40.8 41.7  MCV 91.3 90.8  PLT 311 316    Cardiac Enzymes: No results for input(s): CKTOTAL, CKMB, CKMBINDEX, TROPONINI in the last 168 hours.  Lipid Panel: No results for input(s): CHOL, TRIG, HDL, CHOLHDL, VLDL, LDLCALC in the last 168 hours.  Imaging: CT Head Wo Contrast  Result Date: 08/27/2019 CLINICAL DATA:  39 year old female with altered mental status. EXAM: CT HEAD WITHOUT CONTRAST TECHNIQUE: Contiguous axial images were obtained from the base of the skull through the vertex without intravenous contrast. COMPARISON:  Head CT dated 08/26/2019. FINDINGS: Brain: The ventricles and sulci appropriate size for patient's age. The gray-white matter discrimination is preserved. There is no acute intracranial hemorrhage. No mass effect or midline shift. No extra-axial fluid collection. Vascular: No hyperdense vessel or unexpected calcification. Skull: Normal. Negative for fracture or focal lesion. Sinuses/Orbits: No acute finding. Other: None IMPRESSION: Unremarkable noncontrast CT of the brain. Electronically Signed   By: Elgie Collard M.D.   On: 08/27/2019 16:35   CT Head Wo Contrast  Result Date: 08/26/2019 CLINICAL DATA:  Syncope, weakness, headache EXAM: CT HEAD  WITHOUT CONTRAST TECHNIQUE: Contiguous axial images were obtained from the base of the skull through the vertex without  intravenous contrast. COMPARISON:  12/26/2018 FINDINGS: Brain: No acute infarct or hemorrhage. Lateral ventricles and midline structures are unremarkable. No acute extra-axial fluid collections. No mass effect. Vascular: No hyperdense vessel or unexpected calcification. Skull: Normal. Negative for fracture or focal lesion. Sinuses/Orbits: No acute finding. Other: None IMPRESSION: 1. Stable head CT, no acute process. Electronically Signed   By: Sharlet Salina M.D.   On: 08/26/2019 21:04   CT Cervical Spine Wo Contrast  Result Date: 08/26/2019 CLINICAL DATA:  Syncope, fell, diffuse pain EXAM: CT CERVICAL and LUMBAR SPINE WITHOUT CONTRAST TECHNIQUE: Multidetector CT imaging of the cervical and lumbar spine was performed without intravenous contrast. Multiplanar CT image reconstructions were also generated. COMPARISON:  09/04/2018 FINDINGS: CT CERVICAL SPINE FINDINGS Alignment: Alignment is anatomic. Skull base and vertebrae: No acute displaced fractures. Soft tissues and spinal canal: No prevertebral fluid or swelling. No visible canal hematoma. Disc levels: Disc spaces are well preserved. No significant central canal or neural foraminal encroachment. Upper chest: Airway is patent. Visualized portions of the lung apices are clear. Reconstructed images demonstrate no additional findings. CT LUMBAR SPINE FINDINGS Segmentation: There are 6 non-rib-bearing lumbar type vertebral bodies. Alignment: There is been and discectomy at L6/S1 with posterior fusion at that level. Persistent grade 1 anterolisthesis of L6 relative to S1. Vertebrae: No acute displaced fractures. Paraspinal and other soft tissues: Negative. Disc levels: Aside from the postsurgical changes at L6/S1, the remaining intervertebral disc spaces are unremarkable. IMPRESSION: 1. No acute displaced cervical or lumbar spine fracture. 2.  There are 6 non-rib-bearing lumbar type vertebral bodies. 3. Postsurgical changes at L6/S1 with posterior fusion at that level. Persistent grade 1 anterolisthesis of L6 relative to S1. Electronically Signed   By: Sharlet Salina M.D.   On: 08/26/2019 21:11   CT Lumbar Spine Wo Contrast  Result Date: 08/26/2019 CLINICAL DATA:  Syncope, fell, diffuse pain EXAM: CT CERVICAL and LUMBAR SPINE WITHOUT CONTRAST TECHNIQUE: Multidetector CT imaging of the cervical and lumbar spine was performed without intravenous contrast. Multiplanar CT image reconstructions were also generated. COMPARISON:  09/04/2018 FINDINGS: CT CERVICAL SPINE FINDINGS Alignment: Alignment is anatomic. Skull base and vertebrae: No acute displaced fractures. Soft tissues and spinal canal: No prevertebral fluid or swelling. No visible canal hematoma. Disc levels: Disc spaces are well preserved. No significant central canal or neural foraminal encroachment. Upper chest: Airway is patent. Visualized portions of the lung apices are clear. Reconstructed images demonstrate no additional findings. CT LUMBAR SPINE FINDINGS Segmentation: There are 6 non-rib-bearing lumbar type vertebral bodies. Alignment: There is been and discectomy at L6/S1 with posterior fusion at that level. Persistent grade 1 anterolisthesis of L6 relative to S1. Vertebrae: No acute displaced fractures. Paraspinal and other soft tissues: Negative. Disc levels: Aside from the postsurgical changes at L6/S1, the remaining intervertebral disc spaces are unremarkable. IMPRESSION: 1. No acute displaced cervical or lumbar spine fracture. 2. There are 6 non-rib-bearing lumbar type vertebral bodies. 3. Postsurgical changes at L6/S1 with posterior fusion at that level. Persistent grade 1 anterolisthesis of L6 relative to S1. Electronically Signed   By: Sharlet Salina M.D.   On: 08/26/2019 21:11   MR LUMBAR SPINE WO CONTRAST  Result Date: 08/27/2019 CLINICAL DATA:  Chronic back pain, weakness  EXAM: MRI LUMBAR SPINE WITHOUT CONTRAST TECHNIQUE: Multiplanar, multisequence MR imaging of the lumbar spine was performed. No intravenous contrast was administered. COMPARISON:  CT 08/26/2019, MRI 12/26/2018 FINDINGS: Segmentation: 5 lumbar type vertebral bodies as designated on prior MRI report of 12/26/2018 with the operative  level being labeled as L5-S1. Alignment:  Chronic grade 2 anterolisthesis L5 on S1. Vertebrae: Since the previous MRI, there has been posterior and interbody fusion at L5-S1 with posterior decompression. No fracture, evidence of discitis, or bone lesion. Conus medullaris and cauda equina: Conus extends to the T12-L1 level. Conus and cauda equina appear normal. Paraspinal and other soft tissues: Negative. Disc levels: T12-L1: Unremarkable. L1-L2: Unremarkable. L2-L3: Unremarkable. L3-L4: Unremarkable. L4-L5: Unremarkable disc. Mild epidural lipomatosis, which appears slightly less prominent compared to prior. No foraminal or canal stenosis. L5-S1: Interval fusion and posterior decompression. Evaluation of this level is largely degraded by metallic susceptibility artifact. Bilateral foramina appear to be somewhat improved, although are largely obscured. Epidural lipomatosis is partially visualized. IMPRESSION: 1. Interval L5-S1 posterior and interbody fusion with posterior decompression. Evaluation of this level is degraded by metallic susceptibility artifact. Bilateral L5-S1 foramina appear somewhat improved, although are largely obscured by metallic susceptibility artifact. 2. Epidural lipomatosis at L4-5 and L5-S1, which appears slightly less prominent compared to prior. 3. Remaining vertebral levels are unremarkable without foraminal or canal stenosis. Electronically Signed   By: Duanne Guess D.O.   On: 08/27/2019 13:15   DG Chest Port 1 View  Result Date: 08/27/2019 CLINICAL DATA:  Unresponsive EXAM: PORTABLE CHEST 1 VIEW COMPARISON:  05/21/2019 FINDINGS: Patient rotated minimally  left. Midline trachea. Normal heart size for level of inspiration. Mildly degraded exam due to AP portable technique and patient body habitus. No pleural fluid. Low lung volumes with resultant pulmonary interstitial prominence. No lobar consolidation. No overt congestive failure. IMPRESSION: Low lung volumes, without definite acute disease. Electronically Signed   By: Jeronimo Greaves M.D.   On: 08/27/2019 13:51    Assessment: 40 year old female who presented to the ED with headache, confusion and BLE pain with weakness.  1. Neurological exam reveals giveway weakness in BLE. No evidence for an encephalopathy. Headache currently improving and now rated as 4/10. No nuchal rigidity.  2. No clear localization on exam. The giveway weakness is the only deficit but is not definitively localizable due to poor effort.   3. CT head:  Unremarkable noncontrast CT of the brain. 4. CT lumbar spine: There are 6 non-rib-bearing lumbar type vertebral bodies. Postsurgical changes at L6/S1 with posterior fusion at that level. Persistent grade 1 anterolisthesis of L6 relative to S1. 5. CT cervical spine: No acute displaced cervical or lumbar spine fracture.   6. On empiric ABX for possible meningitis.  7. Given the episode of unresponsiveness with nystagmus following MRI, vertebrobasilar TIA is on the DDx.   Recommendations: 1. ID consult. Uncertain if LP benefits would outweigh risks. The patient is morbidly obese and has a history of lumbar spine surgery, which would make an LP technically difficult with increased risk of complications. She also has no nuchal rigidity and AMS is now resolved, making a meningitis unlikely. If ID opinion is that LP is needed, then it should be performed under fluoroscopy.  2. PT/OT 3. Pain medications as needed for headache and hip/thigh pain.  4. CTA of head and neck to assess for possible vertebrobasilar insufficiency. May also need outpatient open MRI of the brain.  5. Obtain CT of  thoracic spine to rule out cord impingement.   Electronically signed: Dr. Caryl Pina  08/28/2019, 6:28 AM

## 2019-08-28 NOTE — Procedures (Signed)
Patient Name: Carolyn Castro  MRN: [id number]  Epilepsy Attending: Charlsie Quest  Referring Physician/Provider: Dr. Chaya Jan Date: 08/28/2019 Duration: 24.59 mins  Patient history: 39 year old female presented ED with headache confusion and bilateral lower extremity pain with weakness.  EEG to evaluate for seizures.  Level of alertness: Awake  AEDs during EEG study: None  Technical aspects: This EEG study was done with scalp electrodes positioned according to the 10-20 International system of electrode placement. Electrical activity was acquired at a sampling rate of 500Hz  and reviewed with a high frequency filter of 70Hz  and a low frequency filter of 1Hz . EEG data were recorded continuously and digitally stored.   Description: The posterior dominant rhythm consists of 11 Hz activity of moderate voltage (25-35 uV) seen predominantly in posterior head regions, symmetric and reactive to eye opening and eye closing. Hyperventilation and photic stimulation were not performed.  Of note, study was technically difficult due to significant electrode artifact.  IMPRESSION: This technically difficult study is within normal limits. No seizures or epileptiform discharges were seen throughout the recording.  Kristine Chahal 

## 2019-08-28 NOTE — Progress Notes (Signed)
EEG complete - results pending 

## 2019-08-29 LAB — URINE CULTURE: Culture: 2000 — AB

## 2019-08-29 LAB — BASIC METABOLIC PANEL
Anion gap: 11 (ref 5–15)
BUN: 9 mg/dL (ref 6–20)
CO2: 22 mmol/L (ref 22–32)
Calcium: 8.4 mg/dL — ABNORMAL LOW (ref 8.9–10.3)
Chloride: 104 mmol/L (ref 98–111)
Creatinine, Ser: 0.98 mg/dL (ref 0.44–1.00)
GFR calc Af Amer: >60 mL/min (ref >60)
GFR calc non Af Amer: >60 mL/min (ref >60)
Glucose, Bld: 102 mg/dL — ABNORMAL HIGH (ref 70–99)
Potassium: 3.7 mmol/L (ref 3.5–5.1)
Sodium: 137 mmol/L (ref 135–145)

## 2019-08-29 LAB — HSV DNA BY PCR (REFERENCE LAB)
HSV 1 DNA: NEGATIVE
HSV 2 DNA: NEGATIVE

## 2019-08-29 MED ORDER — KETOROLAC TROMETHAMINE 30 MG/ML IJ SOLN
30.0000 mg | Freq: Once | INTRAMUSCULAR | Status: AC
  Administered 2019-08-29: 30 mg via INTRAVENOUS
  Filled 2019-08-29: qty 1

## 2019-08-29 MED ORDER — DIPHENHYDRAMINE HCL 50 MG/ML IJ SOLN
25.0000 mg | Freq: Once | INTRAMUSCULAR | Status: AC
  Administered 2019-08-29: 16:00:00 25 mg via INTRAVENOUS
  Filled 2019-08-29: qty 1

## 2019-08-29 MED ORDER — METOCLOPRAMIDE HCL 5 MG/ML IJ SOLN
5.0000 mg | Freq: Once | INTRAMUSCULAR | Status: AC
  Administered 2019-08-29: 16:00:00 5 mg via INTRAVENOUS
  Filled 2019-08-29: qty 2

## 2019-08-29 NOTE — Plan of Care (Signed)
  Problem: Education: Goal: Knowledge of General Education information will improve Description: Including pain rating scale, medication(s)/side effects and non-pharmacologic comfort measures Outcome: Progressing   Problem: Nutrition: Goal: Adequate nutrition will be maintained Outcome: Progressing   

## 2019-08-29 NOTE — Progress Notes (Signed)
PROGRESS NOTE    LTANYA BAYLEY  ZOX:[id number] DOB: 1981-03-09 DOA: 08/27/2019 PCP: Shirlean Mylar, MD    Brief Narrative:  39 y.o. female with past medical history significant for hypothyroidism, asthma, morbid obesity, hypertension, GERD.  She had a syncopal event yesterday and presented to the emergency department and was discharged home after a fairly normal work-up.  This morning she was scheduled for an MRI of her lumbar spine as ordered by her neurosurgeon as an outpatient for bilateral lower extremity weakness.  At the completion of her MRI she was noticed to be non-responsive.  She was not noticed to be in respiratory distress, per report there was no response to noxious stimuli.  She was noted to have nystagmus at that time, pupils were equal round and reactive to light per report.  She was then sent to the ED for evaluation.  In the ED labs are noted to be fairly unremarkable, WBC count was 13.8, she had a negative chest x-ray and a negative urine analysis.  CT scan of the head is unremarkable for acute findings.  UDS is positive for opiates and I do not see any on her home medication list.  She did have a temperature of 101.4 on arrival.  At the time of my evaluation she is alert and oriented.  She tells me that last Saturday at [facility] she was diagnosed with influenza, was not given Tamiflu.  Since then she has been having headaches, fevers, body aches, sore throat, cough and mild shortness of breath above her baseline.  Influenza A and B and rapid Covid PCR test are negative.  Admission has been requested for further evaluation and management.  Assessment & Plan:   Principal Problem:   Syncope Active Problems:   Hypothyroidism   Asthma   Obesity, Class III, BMI 40-49.9 (morbid obesity) (HCC)   Essential hypertension   GERD (gastroesophageal reflux disease)   Fever   Flu-like symptoms   Chronic hypoxemic respiratory failure (HCC)   Syncope and collapse  #1.   Syncope/episode of unresponsiveness.   -Awake and interactive this AM, still complaining of headache with nausea, photophobia, phonophobia -Pt is s/p lumbar puncture performed by interventional radiology.  Evaluated by neurology who opine neuro exam no evidence of encephalopathy, no clear localization on exam.  CT the head was unremarkable.  CT of the lumbar spine reveals postsurgical changes, CT of C-spine no acute displaced cervical fracture -LP results per Neurology with elevated protein likely elevated due to prior spinal surgeries. CSF appears noninfectious with recommendation to d/c antivirals and abx  #2.  Flulike symptoms.   -Respiratory panel negative.  Influenza a and B, rapid Covid PCR negative.  SARS coronavirus 2  Negative.  Cough from asthma -Given gentle IVF -Cont with supportive tx for now  #3.  Hypothyroidism.  Home medications include Synthroid.  TSH within the limits of normal -Cont thyroid replacement as tolerated  4.  Asthma.  Currently compensated.  Reports she has needed her as needed home nebulizers more lately.  Home medications include as needed nebs and inhalers. -Continue home meds -Scheduled nebs for 24 hours -Pt uses 2LNC at night, see below. Will ensure nocturnal O2  #5.  GERD.  Stable at baseline. -Continue PPI as tolerated  #6.  Hypertension.   -No antihypertensive meds on home list. -BP stable at this time  #7.  Morbid obesity. BMI greater than 40. -recommend diet/lifestyle modification  #8. Tachycardia with tachypnea -HR has been persistently elevated over the  past 48hrs with HR into the 120's and tachypnea with RR as high as 30 -CXR reviewed, low lung volumes noted -Unclear etiology. Pt does appear uncomfortable on exam -Given persistent HR and tachypnea, patient is considered too hemodynamically unstable for discharge today. Pt will require IV medication per below  #9. Headache -Complains of continued headache with photophobia and  phonophobia with nausea -Little to no relief with current anlalgesic -Have ordered IV toradol, IV reglan, IV benadryl -Have ordered [facility] for nocturnal use, as pt uses at home prior to admit  DVT prophylaxis: SCD's Code Status: Full Family Communication: Pt in room, family not at bedside Disposition Plan: From home, likely home 2/26 if headache and hemodynamics improve  Consultants:   Neurology  Procedures:   LP  Antimicrobials: Anti-infectives (From admission, onward)   Start     Dose/Rate Route Frequency Ordered Stop   08/28/19 1400  acyclovir (ZOVIRAX) 820 mg in dextrose 5 % 150 mL IVPB  Status:  Discontinued     10 mg/kg  81.8 kg (Adjusted) 166.4 mL/hr over 60 Minutes Intravenous Every 8 hours 08/28/19 1308 08/28/19 1539   08/27/19 2330  vancomycin (VANCOREADY) IVPB 1250 mg/250 mL  Status:  Discontinued     1,250 mg 166.7 mL/hr over 90 Minutes Intravenous Every 8 hours 08/27/19 1814 08/28/19 1300   08/27/19 2300  cefTRIAXone (ROCEPHIN) 2 g in sodium chloride 0.9 % 100 mL IVPB  Status:  Discontinued     2 g 200 mL/hr over 30 Minutes Intravenous Every 24 hours 08/27/19 2248 08/27/19 2253   08/27/19 2000  acyclovir (ZOVIRAX) 820 mg in dextrose 5 % 150 mL IVPB  Status:  Discontinued     10 mg/kg  81.8 kg (Adjusted) 166.4 mL/hr over 60 Minutes Intravenous Every 8 hours 08/27/19 1820 08/28/19 1300   08/27/19 1845  cefTRIAXone (ROCEPHIN) 2 g in sodium chloride 0.9 % 100 mL IVPB  Status:  Discontinued     2 g 200 mL/hr over 30 Minutes Intravenous Every 12 hours 08/27/19 1826 08/28/19 1300   08/27/19 1430  vancomycin (VANCOREADY) IVPB 2000 mg/400 mL     2,000 mg 200 mL/hr over 120 Minutes Intravenous  Once 08/27/19 1420 08/27/19 2217   08/27/19 1415  ceFEPIme (MAXIPIME) 2 g in sodium chloride 0.9 % 100 mL IVPB     2 g 200 mL/hr over 30 Minutes Intravenous  Once 08/27/19 1407 08/27/19 2217   08/27/19 1415  metroNIDAZOLE (FLAGYL) IVPB 500 mg     500 mg 100 mL/hr over 60 Minutes  Intravenous  Once 08/27/19 1407 08/27/19 2217   08/27/19 1415  vancomycin (VANCOCIN) IVPB 1000 mg/200 mL premix  Status:  Discontinued     1,000 mg 200 mL/hr over 60 Minutes Intravenous  Once 08/27/19 1407 08/27/19 1419       Subjective: Still complaining of band-like headache primarily in the posterior of the head  Objective: Vitals:   08/29/19 1430 08/29/19 1456 08/29/19 1500 08/29/19 1648  BP:    117/72  Pulse:  100 (!) 105 (!) 106  Resp: 20  (!) 24 19  Temp:    98.1 F (36.7 C)  TempSrc:    Oral  SpO2:   95% 95%    Intake/Output Summary (Last 24 hours) at 08/29/2019 1702 Last data filed at 08/29/2019 1600 Gross per 24 hour  Intake 815.92 ml  Output 850 ml  Net -34.08 ml   There were no vitals filed for this visit.  Examination:  General exam: Appears calm,  appears uncomfortable Respiratory system: Clear to auscultation. Respiratory effort normal. Cardiovascular system: S1 & S2 heard, Regular Gastrointestinal system: Abdomen is nondistended, obese Central nervous system: Alert and oriented. No focal neurological deficits. Extremities: Symmetric 5 x 5 power. Skin: No rashes, lesions Psychiatry: Judgement and insight appear normal. Mood & affect appropriate.   Data Reviewed: I have personally reviewed following labs and imaging studies  CBC: Recent Labs  Lab 08/26/19 2015 08/27/19 1308 08/28/19 0623  WBC 8.9 13.8* 8.3  NEUTROABS 5.5 11.7*  --   HGB 13.1 13.6 12.3  HCT 40.8 41.7 37.9  MCV 91.3 90.8 89.6  PLT 311 316 293   Basic Metabolic Panel: Recent Labs  Lab 08/26/19 2015 08/27/19 1308 08/28/19 0623 08/29/19 0122  NA 138 134* 139 137  K 3.9 4.1 3.5 3.7  CL 106 100 107 104  CO2 20* 23 24 22   GLUCOSE 96 106* 97 102*  BUN 13 14 8 9   CREATININE 0.95 0.91 0.84 0.98  CALCIUM 8.4* 9.0 8.2* 8.4*   GFR: Estimated Creatinine Clearance: 101.5 mL/min (by C-G formula based on SCr of 0.98 mg/dL). Liver Function Tests: Recent Labs  Lab 08/26/19 2015  08/27/19 1308  AST 24 36  ALT 27 34  ALKPHOS 60 69  BILITOT 0.4 0.7  PROT 6.2* 7.7  ALBUMIN 3.1* 3.6   Recent Labs  Lab 08/27/19 1308  LIPASE 23   No results for input(s): AMMONIA in the last 168 hours. Coagulation Profile: Recent Labs  Lab 08/27/19 1308  INR 0.9   Cardiac Enzymes: No results for input(s): CKTOTAL, CKMB, CKMBINDEX, TROPONINI in the last 168 hours. BNP (last 3 results) No results for input(s): PROBNP in the last 8760 hours. HbA1C: Recent Labs    08/28/19 1327  HGBA1C 5.6   CBG: No results for input(s): GLUCAP in the last 168 hours. Lipid Profile: No results for input(s): CHOL, HDL, LDLCALC, TRIG, CHOLHDL, LDLDIRECT in the last 72 hours. Thyroid Function Tests: Recent Labs    08/27/19 2322  TSH 4.201   Anemia Panel: No results for input(s): VITAMINB12, FOLATE, FERRITIN, TIBC, IRON, RETICCTPCT in the last 72 hours. Sepsis Labs: Recent Labs  Lab 08/27/19 1308 08/27/19 1540  LATICACIDVEN 1.4 1.2    Recent Results (from the past 240 hour(s))  Blood Culture (routine x 2)     Status: None (Preliminary result)   Collection Time: 08/27/19  1:08 PM   Specimen: BLOOD  Result Value Ref Range Status   Specimen Description BLOOD RIGHT ANTECUBITAL  Final   Special Requests   Final    BOTTLES DRAWN AEROBIC AND ANAEROBIC Blood Culture adequate volume Performed at [facility], [address] [place], [place] [address]    Culture NO GROWTH 2 DAYS  Final   Report Status PENDING  Incomplete  Blood Culture (routine x 2)     Status: None (Preliminary result)   Collection Time: 08/27/19  1:13 PM   Specimen: BLOOD  Result Value Ref Range Status   Specimen Description BLOOD BLOOD LEFT FOREARM  Final   Special Requests   Final    BOTTLES DRAWN AEROBIC AND ANAEROBIC Blood Culture adequate volume Performed at [facility], [address] [place], [place] [address]    Culture NO GROWTH 2 DAYS  Final   Report Status  PENDING  Incomplete  Respiratory Panel by RT PCR (Flu A&B, Covid) - Nasopharyngeal Swab     Status: None   Collection Time: 08/27/19  1:16 PM  Specimen: Nasopharyngeal Swab  Result Value Ref Range Status   SARS Coronavirus 2 by RT PCR NEGATIVE NEGATIVE Final    Comment: (NOTE) SARS-CoV-2 target nucleic acids are NOT DETECTED. The SARS-CoV-2 RNA is generally detectable in upper respiratoy specimens during the acute phase of infection. The lowest concentration of SARS-CoV-2 viral copies this assay can detect is 131 copies/mL. A negative result does not preclude SARS-Cov-2 infection and should not be used as the sole basis for treatment or other patient management decisions. A negative result may occur with  improper specimen collection/handling, submission of specimen other than nasopharyngeal swab, presence of viral mutation(s) within the areas targeted by this assay, and inadequate number of viral copies (<131 copies/mL). A negative result must be combined with clinical observations, patient history, and epidemiological information. The expected result is Negative. Fact Sheet for Patients:  https://www.moore.com/ Fact Sheet for Healthcare Providers:  https://www.young.biz/ This test is not yet ap proved or cleared by the [place] FDA and  has been authorized for detection and/or diagnosis of SARS-CoV-2 by FDA under an Emergency Use Authorization (EUA). This EUA will remain  in effect (meaning this test can be used) for the duration of the COVID-19 declaration under Section 564(b)(1) of the Act, 21 U.S.C. section 360bbb-3(b)(1), unless the authorization is terminated or revoked sooner.    Influenza A by PCR NEGATIVE NEGATIVE Final   Influenza B by PCR NEGATIVE NEGATIVE Final    Comment: (NOTE) The Xpert Xpress SARS-CoV-2/FLU/RSV assay is intended as an aid in  the diagnosis of influenza from Nasopharyngeal swab specimens and  should not  be used as a sole basis for treatment. Nasal washings and  aspirates are unacceptable for Xpert Xpress SARS-CoV-2/FLU/RSV  testing. Fact Sheet for Patients: https://www.moore.com/ Fact Sheet for Healthcare Providers: https://www.young.biz/ This test is not yet approved or cleared by the [place] FDA and  has been authorized for detection and/or diagnosis of SARS-CoV-2 by  FDA under an Emergency Use Authorization (EUA). This EUA will remain  in effect (meaning this test can be used) for the duration of the  Covid-19 declaration under Section 564(b)(1) of the Act, 21  U.S.C. section 360bbb-3(b)(1), unless the authorization is  terminated or revoked. Performed at [facility], [address] [place], [place] [address]   Urine culture     Status: Abnormal   Collection Time: 08/27/19  2:30 PM   Specimen: In/Out Cath Urine  Result Value Ref Range Status   Specimen Description IN/OUT CATH URINE  Final   Special Requests NONE  Final   Culture 2,000 COLONIES/mL STAPHYLOCOCCUS EPIDERMIDIS (A)  Final   Report Status 08/29/2019 FINAL  Final   Organism ID, Bacteria STAPHYLOCOCCUS EPIDERMIDIS (A)  Final      Susceptibility   Staphylococcus epidermidis - MIC*    CIPROFLOXACIN >=8 RESISTANT Resistant     GENTAMICIN <=0.5 SENSITIVE Sensitive     NITROFURANTOIN <=16 SENSITIVE Sensitive     OXACILLIN >=4 RESISTANT Resistant     TETRACYCLINE >=16 RESISTANT Resistant     VANCOMYCIN 1 SENSITIVE Sensitive     TRIMETH/SULFA 80 RESISTANT Resistant     CLINDAMYCIN >=8 RESISTANT Resistant     RIFAMPIN <=0.5 SENSITIVE Sensitive     Inducible Clindamycin Value in next row Sensitive      NEGATIVEPerformed at [facility], [address] [place], [place] [address]    * 2,000 COLONIES/mL STAPHYLOCOCCUS EPIDERMIDIS  SARS Coronavirus 2 by RT PCR     Status: None  Collection Time: 08/28/19  1:55 AM  Result Value Ref Range Status   SARS  Coronavirus 2 NEGATIVE NEGATIVE Final    Comment: (NOTE) Result indicates the ABSENCE of SARS-CoV-2 RNA in the patient specimen.  The lowest concentration of SARS-CoV-2 viral copies this assay can detect in nasopharyngeal swab specimens is 500 copies / mL.  A negative result does not preclude SARS-CoV-2 infection and should not be used as the sole basis for patient management decisions. A negative result may occur with improper specimen collection / handling, submission of a specimen other than nasopharyngeal swab, presence of viral mutation(s) within the areas targeted by this assay, and inadequate number of viral copies (<500 copies / mL) present.  Negative results must be combined with clinical observations, patient history, and epidemiological information.  The expected result is NEGATIVE.  Patient Fact Sheet:  https://wong-henderson.biz/   Provider Fact Sheet:  CheapJackpot.at   This test is not yet approved or cleared by the [place] FDA and  has been authorized for  detection and/or diagnosis of SARS-CoV-2 by FDA under an Emergency Use Authorization (EUA).  This EUA will remain in effect (meaning this test can be used) for the duration of  the COVID-19 declaration under Section 564(b)(1) of the Act, 21 U.S.C. section 360bbb-3(b)(1), unless the authorization is terminated or revoked sooner Performed at [facility], [address], [place], [place] [address]   CSF culture     Status: None (Preliminary result)   Collection Time: 08/28/19 10:48 AM   Specimen: PATH Cytology CSF; Cerebrospinal Fluid  Result Value Ref Range Status   Specimen Description CSF  Final   Special Requests NONE  Final   Gram Stain NO WBC SEEN NO ORGANISMS SEEN CYTOSPIN SMEAR   Final   Culture   Final    NO GROWTH < 24 HOURS Performed at [facility], [address] [place], [place] [address]    Report Status PENDING  Incomplete      Radiology Studies: MR BRAIN WO CONTRAST  Result Date: 08/28/2019 CLINICAL DATA:  Seizure, abnormal neuro exam. Additional history provided: Non-responsive episode, possible seizure EXAM: MRI HEAD WITHOUT CONTRAST TECHNIQUE: Multiplanar, multiecho pulse sequences of the brain and surrounding structures were obtained without intravenous contrast. COMPARISON:  Head CT 08/27/2019 FINDINGS: Brain: The examination is intermittently motion degraded. Most notably there is mild/moderate motion degradation of the axial T1 weighted sequence. There is no evidence of acute infarct. No evidence of intracranial mass. No midline shift or extra-axial fluid collection. No chronic intracranial blood products. No focal parenchymal signal abnormality is identified. The hippocampi are symmetric in size and signal. Cerebral volume is normal for age. Vascular: Flow voids maintained within the proximal large arterial vessels. Skull and upper cervical spine: No focal marrow lesion. Sinuses/Orbits: Visualized orbits demonstrate no acute abnormality. Minimal ethmoid sinus mucosal thickening. No significant mastoid effusion. IMPRESSION: Intermittently motion degraded examination. Unremarkable MRI appearance of the brain for age. No evidence of acute intracranial abnormality on this noncontrast examination. No specific seizure focus is identified. Electronically Signed   By: Jackey Loge DO   On: 08/28/2019 12:17   EEG adult  Result Date: 08/28/2019 Charlsie Quest, MD     08/29/2019  9:36 AM Patient Name: RYLAH FUKUDA MRN: [id number] Epilepsy Attending: Charlsie Quest Referring Physician/Provider: Dr. Chaya Jan Date: 08/28/2019 Duration: 24.59 mins Patient history: 39 year old female presented ED with headache confusion and bilateral lower extremity pain with weakness.  EEG to evaluate  for seizures. Level of alertness: Awake AEDs during EEG study: None Technical aspects: This EEG study was done with scalp  electrodes positioned according to the 10-20 International system of electrode placement. Electrical activity was acquired at a sampling rate of 500Hz  and reviewed with a high frequency filter of 70Hz  and a low frequency filter of 1Hz . EEG data were recorded continuously and digitally stored. Description: The posterior dominant rhythm consists of 11 Hz activity of moderate voltage (25-35 uV) seen predominantly in posterior head regions, symmetric and reactive to eye opening and eye closing. Hyperventilation and photic stimulation were not performed. Of note, study was technically difficult due to significant electrode artifact. IMPRESSION: This technically difficult study is within normal limits. No seizures or epileptiform discharges were seen throughout the recording.   ECHOCARDIOGRAM COMPLETE  Result Date: 08/28/2019    ECHOCARDIOGRAM REPORT   Patient Name:   CHEYNNE VIRDEN Date of Exam: 08/28/2019 Medical Rec #:  08/30/2019         Height:       64.0 in Accession #:    Suezanne Cheshire        Weight:       270.0 lb Date of Birth:  [birth year]        BSA:          2.223 m Patient Age:    38 years          BP:           150/75 mmHg Patient Gender: F                 HR:           116 bpm. Exam Location:  Inpatient Procedure: 2D Echo Indications:    Syncope R55  History:        Patient has prior history of Echocardiogram examinations, most                 recent 12/27/2018. Risk Factors:Hypertension.  Sonographer:    05/22/1982 RDCS (AE) Referring Phys: [id number] ESTELA Y HERNANDEZ ACOSTA IMPRESSIONS  1. Left ventricular ejection fraction, by estimation, is 65 to 70%. The left ventricle has normal function. The left ventricle has no regional wall motion abnormalities. Left ventricular diastolic parameters were normal.  2. Right ventricular systolic function is normal. The right ventricular size is normal. Tricuspid regurgitation signal is inadequate for assessing PA pressure.  3. The mitral valve is  grossly normal. No evidence of mitral valve regurgitation. No evidence of mitral stenosis.  4. The aortic valve is grossly normal. Aortic valve regurgitation is not visualized. No aortic stenosis is present. Comparison(s): A prior study was performed on 12/27/2018. No significant change from prior study. Prior images reviewed side by side. FINDINGS  Left Ventricle: Left ventricular ejection fraction, by estimation, is 65 to 70%. The left ventricle has normal function. The left ventricle has no regional wall motion abnormalities. The left ventricular internal cavity size was normal in size. There is  no left ventricular hypertrophy. Left ventricular diastolic parameters were normal. Right Ventricle: The right ventricular size is normal. No increase in right ventricular wall thickness. Right ventricular systolic function is normal. Tricuspid regurgitation signal is inadequate for assessing PA pressure. Left Atrium: Left atrial size was normal in size. Right Atrium: Right atrial size was normal in size. Pericardium: Trivial pericardial effusion is present. Presence of pericardial fat pad. Mitral Valve: The mitral valve is grossly normal. No evidence of mitral valve regurgitation. No evidence  of mitral valve stenosis. Tricuspid Valve: The tricuspid valve is grossly normal. Tricuspid valve regurgitation is not demonstrated. Aortic Valve: The aortic valve is grossly normal. Aortic valve regurgitation is not visualized. No aortic stenosis is present. Pulmonic Valve: The pulmonic valve was grossly normal. Pulmonic valve regurgitation is not visualized. No evidence of pulmonic stenosis. Aorta: The aortic root and ascending aorta are structurally normal, with no evidence of dilitation. IAS/Shunts: No atrial level shunt detected by color flow Doppler.  LEFT VENTRICLE PLAX 2D LVIDd:         4.20 cm  Diastology LVIDs:         2.70 cm  LV e' lateral:   10.80 cm/s LV PW:         1.10 cm  LV E/e' lateral: 9.6 LV IVS:        1.10 cm   LV e' medial:    10.70 cm/s LVOT diam:     2.00 cm  LV E/e' medial:  9.7 LV SV:         58 LV SV Index:   26 LVOT Area:     3.14 cm  RIGHT VENTRICLE TAPSE (M-mode): 2.3 cm LEFT ATRIUM           Index       RIGHT ATRIUM          Index LA diam:      3.20 cm 1.44 cm/m  RA Area:     7.21 cm LA Vol (A2C): 20.8 ml 9.36 ml/m  RA Volume:   10.50 ml 4.72 ml/m LA Vol (A4C): 25.5 ml 11.47 ml/m  AORTIC VALVE LVOT Vmax:   118.00 cm/s LVOT Vmean:  77.100 cm/s LVOT VTI:    0.184 m  AORTA Ao Root diam: 3.00 cm MITRAL VALVE MV Area (PHT): 4.06 cm     SHUNTS MV Decel Time: 187 msec     Systemic VTI:  0.18 m MV E velocity: 104.00 cm/s  Systemic Diam: 2.00 cm MV A velocity: 106.00 cm/s MV E/A ratio:  0.98 Lennie Odor MD Electronically signed by Lennie Odor MD Signature Date/Time: 08/28/2019/5:53:52 PM    Final    DG FL GUIDED LUMBAR PUNCTURE  Result Date: 08/28/2019 CLINICAL DATA:  Seizure. EXAM: DIAGNOSTIC LUMBAR PUNCTURE UNDER FLUOROSCOPIC GUIDANCE FLUOROSCOPY TIME:  Fluoroscopy Time:  30 seconds Radiation Exposure Index (if provided by the fluoroscopic device): 15.90 mGy Number of Acquired Spot Images: 1 PROCEDURE: Informed consent was obtained from the patient prior to the procedure, including potential complications of headache, allergy, and pain. With the patient prone, the lower back was prepped with Betadine. 1% Lidocaine was used for local anesthesia. There are 6 non-rib-bearing lumbar type vertebral bodies. For the purposes of this examination the lowest well-formed intervertebral disc is designated L6-S1. Lumbar puncture was performed at the L4-L5 level using a 20 gauge needle with return of clear CSF with an opening pressure of 21 cm water. 10 ml of CSF were obtained for laboratory studies. The patient tolerated the procedure well and there were no apparent complications. IMPRESSION: Technically successful fluoroscopic guided L4-L5 lumbar puncture. No immediate postprocedure complication. Opening pressure 21  cm water. 10 mL of CSF obtained for laboratory studies. Electronically Signed   By: Jackey Loge DO   On: 08/28/2019 10:54    Scheduled Meds: . amLODipine  10 mg Oral Daily  . fluticasone  2 spray Each Nare Daily  . furosemide  40 mg Oral Daily  . levothyroxine  75 mcg Oral Q0600  .  loratadine  10 mg Oral Daily  . mometasone-formoterol  2 puff Inhalation BID  . montelukast  10 mg Oral QPM  . multivitamin with minerals  1 tablet Oral Daily  . norethindrone-ethinyl estradiol  1 tablet Oral Daily  . pantoprazole  40 mg Oral Daily  . PARoxetine  60 mg Oral Daily   Continuous Infusions: . sodium chloride 75 mL/hr at 08/29/19 0558     LOS: 2 days   Rickey BarbaraStephen Kelin Nixon, MD [facility] Pager On Amion  If 7PM-7AM, please contact night-coverage 08/29/2019, 5:02 PM

## 2019-08-30 LAB — CREATININE, SERUM
Creatinine, Ser: 0.83 mg/dL (ref 0.44–1.00)
GFR calc Af Amer: >60 mL/min (ref >60)
GFR calc non Af Amer: >60 mL/min (ref >60)

## 2019-08-30 NOTE — Discharge Summary (Signed)
Physician Discharge Summary  Carolyn Castro UEK:[id number] DOB: April 08, 1981 DOA: 08/27/2019  PCP: Shirlean Mylar, MD  Admit date: 08/27/2019 Discharge date: 08/30/2019  Admitted From: Home Disposition:  Home  Recommendations for Outpatient Follow-up:  1. Follow up with PCP in 1 week  Discharge Condition: Stable CODE STATUS: Full  Diet recommendation: Heart healthy diet   Brief/Interim Summary: Carolyn Castro is 39 y.o.femalewith past medical history significant for hypothyroidism, asthma, morbid obesity, hypertension, GERD. She had a syncopal event yesterday and presented to the emergency department and was discharged home after a fairly normal work-up. This morning she was scheduled for an MRI of her lumbar spine as ordered by her neurosurgeon as an outpatient for bilateral lower extremity weakness. At the completion of her MRI she was noticed to be non-responsive. She was not noticed to be in respiratory distress, per report there was no response to noxious stimuli. She was noted to have nystagmus at that time, pupils were equal round and reactive to light per report. She was then sent to the ED for evaluation.   In the ED labs are noted to be fairly unremarkable, WBC count was 13.8, she had a negative chest x-ray and a negative urine analysis. CT scan of the head is unremarkable for acute findings. UDS is positive for opiates and I do not see any on her home medication list. She did have a temperature of 101.4 on arrival. At the time of evaluation she is alert and oriented. She tells me that last Saturday at [facility] she was diagnosed with influenza, was not given Tamiflu. Since then she has been having headaches, fevers,body aches, sore throat, cough and mild shortness of breath above her baseline. Influenza Aand B and rapid Covid PCR test are negative. Admission has been requested for further evaluation and management.  She underwent significant neurologic and  infectious work-up.  She underwent lumbar puncture without infection found and patient was stopped on her antibiotics and antiviral.  Neurology was also consulted during her hospitalization.  She was treated with supportive care.  On day of discharge, she had reported feeling better, headache much more tolerable, felt that she could manage her symptoms at home and had no further questions or concerns.  She is instructed to follow-up with her primary care physician closely.   Discharge Diagnoses:  Principal Problem:   Syncope Active Problems:   Hypothyroidism   Asthma   Obesity, Class III, BMI 40-49.9 (morbid obesity) (HCC)   Essential hypertension   GERD (gastroesophageal reflux disease)   Fever   Flu-like symptoms   Chronic hypoxemic respiratory failure (HCC)   Syncope and collapse   #1. Syncope/episode of unresponsiveness.  -Pt is s/p lumbar puncture performed by interventional radiology. Evaluated by neurology who opine neuro exam no evidence of encephalopathy,no clear localization on exam. CT the head was unremarkable. CT of the lumbar spine reveals postsurgical changes, CT of C-spine no acute displaced cervical fracture -LP results per Neurology with elevated protein likely elevated due to prior spinal surgeries. CSF appears noninfectious with recommendation to d/c antivirals and abx  #2. Flulike symptoms.  -Respiratory panel negative. Influenza a and B, rapid Covid PCR negative. SARS coronavirus 2 Negative.Cough from asthma -Cont with supportive tx for now  #3. Hypothyroidism. Home medications include Synthroid. TSH within the limits of normal -Cont thyroid replacement   #4. Asthma. Currently compensated. Reports she has needed her as needed home nebulizers more lately. Home medications include as needed nebs and inhalers. -Continue home meds  #  5. GERD. Stable at baseline. -Continue PPI  #6. Hypertension.  -BP stable at this time  #7. Morbid  obesity. BMI greater than 40. -Recommend diet/lifestyle modification  #8. Tachycardia with tachypnea -HR has been persistently elevated over the past 48hrs with HR into the 120's and tachypnea with RR as high as 30 -CXR reviewed, low lung volumes noted -Improved without any symptoms this morning  #9. Headache -Improved   Discharge Instructions  Discharge Instructions    Call MD for:  difficulty breathing, headache or visual disturbances   Complete by: As directed    Call MD for:  extreme fatigue   Complete by: As directed    Call MD for:  persistant dizziness or light-headedness   Complete by: As directed    Call MD for:  persistant nausea and vomiting   Complete by: As directed    Call MD for:  severe uncontrolled pain   Complete by: As directed    Call MD for:  temperature >100.4   Complete by: As directed    Diet - low sodium heart healthy   Complete by: As directed    Discharge instructions   Complete by: As directed    You were cared for by a hospitalist during your hospital stay. If you have any questions about your discharge medications or the care you received while you were in the hospital after you are discharged, you can call the unit and ask to speak with the hospitalist on call if the hospitalist that took care of you is not available. Once you are discharged, your primary care physician will handle any further medical issues. Please note that NO REFILLS for any discharge medications will be authorized once you are discharged, as it is imperative that you return to your primary care physician (or establish a relationship with a primary care physician if you do not have one) for your aftercare needs so that they can reassess your need for medications and monitor your lab values.   Increase activity slowly   Complete by: As directed      Allergies as of 08/30/2019      Reactions   Ciprofloxacin Hcl Diarrhea, Other (See Comments)   Developed C-Diff      Medication  List    TAKE these medications   acetaminophen 500 MG tablet Commonly known as: TYLENOL Take 1,000 mg by mouth every 6 (six) hours as needed (for pain).   albuterol 108 (90 Base) MCG/ACT inhaler Commonly known as: VENTOLIN HFA Inhale 2 puffs into the lungs every 6 (six) hours as needed for wheezing or shortness of breath.   ALPRAZolam 1 MG tablet Commonly known as: XANAX Take 0.5 mg by mouth 2 (two) times daily as needed for anxiety. What changed: Another medication with the same name was removed. Continue taking this medication, and follow the directions you see here.   amLODipine 10 MG tablet Commonly known as: NORVASC Take 10 mg by mouth daily.   budesonide-formoterol 160-4.5 MCG/ACT inhaler Commonly known as: SYMBICORT Inhale 2 puffs into the lungs 2 (two) times daily.   cholecalciferol 25 MCG (1000 UNIT) tablet Commonly known as: VITAMIN D3 Take 1,000 Units by mouth daily.   CRANBERRY PO Take 1 tablet by mouth 2 (two) times daily.   cyclobenzaprine 10 MG tablet Commonly known as: FLEXERIL Take 1 tablet (10 mg total) by mouth 2 (two) times daily as needed for muscle spasms.   diphenhydrAMINE 25 MG tablet Commonly known as: BENADRYL Take 50 mg by  mouth as needed for itching or allergies.   fexofenadine 180 MG tablet Commonly known as: ALLEGRA Take 180 mg by mouth daily.   furosemide 40 MG tablet Commonly known as: LASIX Take 40 mg by mouth daily.   gabapentin 300 MG capsule Commonly known as: NEURONTIN Take 300 mg by mouth 3 (three) times daily as needed (pain).   ibuprofen 600 MG tablet Commonly known as: ADVIL Take 1 tablet (600 mg total) by mouth every 6 (six) hours as needed.   ipratropium-albuterol 0.5-2.5 (3) MG/3ML Soln Commonly known as: DUONEB Take 3 mLs by nebulization every 6 (six) hours as needed (sob and wheezing).   levothyroxine 75 MCG tablet Commonly known as: SYNTHROID Take 75 mcg by mouth daily before breakfast.   meloxicam 15 MG  tablet Commonly known as: MOBIC Take 1 tablet (15 mg total) by mouth daily. What changed:   when to take this  reasons to take this   mometasone 50 MCG/ACT nasal spray Commonly known as: NASONEX Place 2 sprays into the nose daily.   montelukast 10 MG tablet Commonly known as: SINGULAIR Take 10 mg by mouth every evening.   norethindrone-ethinyl estradiol 1-20 MG-MCG tablet Commonly known as: LOESTRIN Take 1 tablet by mouth daily.   omeprazole 20 MG capsule Commonly known as: PRILOSEC Take 20 mg by mouth 2 (two) times daily.   ondansetron 4 MG disintegrating tablet Commonly known as: ZOFRAN-ODT Take 4 mg by mouth daily as needed for nausea.   PARoxetine 30 MG tablet Commonly known as: PAXIL TAKE 2 TABLETS(60 MG) BY MOUTH DAILY What changed: See the new instructions.   PROBIOTIC DAILY PO Take 1 tablet by mouth daily.   Promethazine-Codeine 6.25-10 MG/5ML Soln Take 5 mLs by mouth every 6 (six) hours as needed (cough).   Stool Softener 100 MG capsule Generic drug: Docusate Sodium Take 100 mg by mouth 2 (two) times daily.   traZODone 50 MG tablet Commonly known as: DESYREL Take 2 tablets (100 mg total) by mouth at bedtime as needed for sleep.   WOMENS MULTI PO Take 1 tablet by mouth daily.      Follow-up Information    Shirlean Mylar, MD. Schedule an appointment as soon as possible for a visit in 1 week(s).   Specialty: Family Medicine Contact information: [address] [place] [address] [phone]          Allergies  Allergen Reactions  . Ciprofloxacin Hcl Diarrhea and Other (See Comments)    Developed C-Diff    Consultations:  Neurology    Procedures/Studies: CT Head Wo Contrast  Result Date: 08/27/2019 CLINICAL DATA:  39 year old female with altered mental status. EXAM: CT HEAD WITHOUT CONTRAST TECHNIQUE: Contiguous axial images were obtained from the base of the skull through the vertex without intravenous contrast.  COMPARISON:  Head CT dated 08/26/2019. FINDINGS: Brain: The ventricles and sulci appropriate size for patient's age. The gray-white matter discrimination is preserved. There is no acute intracranial hemorrhage. No mass effect or midline shift. No extra-axial fluid collection. Vascular: No hyperdense vessel or unexpected calcification. Skull: Normal. Negative for fracture or focal lesion. Sinuses/Orbits: No acute finding. Other: None IMPRESSION: Unremarkable noncontrast CT of the brain. Electronically Signed   By: Elgie Collard M.D.   On: 08/27/2019 16:35   CT Head Wo Contrast  Result Date: 08/26/2019 CLINICAL DATA:  Syncope, weakness, headache EXAM: CT HEAD WITHOUT CONTRAST TECHNIQUE: Contiguous axial images were obtained from the base of the skull through the vertex without intravenous  contrast. COMPARISON:  12/26/2018 FINDINGS: Brain: No acute infarct or hemorrhage. Lateral ventricles and midline structures are unremarkable. No acute extra-axial fluid collections. No mass effect. Vascular: No hyperdense vessel or unexpected calcification. Skull: Normal. Negative for fracture or focal lesion. Sinuses/Orbits: No acute finding. Other: None IMPRESSION: 1. Stable head CT, no acute process. Electronically Signed   By: Sharlet Salina M.D.   On: 08/26/2019 21:04   CT Cervical Spine Wo Contrast  Result Date: 08/26/2019 CLINICAL DATA:  Syncope, fell, diffuse pain EXAM: CT CERVICAL and LUMBAR SPINE WITHOUT CONTRAST TECHNIQUE: Multidetector CT imaging of the cervical and lumbar spine was performed without intravenous contrast. Multiplanar CT image reconstructions were also generated. COMPARISON:  09/04/2018 FINDINGS: CT CERVICAL SPINE FINDINGS Alignment: Alignment is anatomic. Skull base and vertebrae: No acute displaced fractures. Soft tissues and spinal canal: No prevertebral fluid or swelling. No visible canal hematoma. Disc levels: Disc spaces are well preserved. No significant central canal or neural foraminal  encroachment. Upper chest: Airway is patent. Visualized portions of the lung apices are clear. Reconstructed images demonstrate no additional findings. CT LUMBAR SPINE FINDINGS Segmentation: There are 6 non-rib-bearing lumbar type vertebral bodies. Alignment: There is been and discectomy at L6/S1 with posterior fusion at that level. Persistent grade 1 anterolisthesis of L6 relative to S1. Vertebrae: No acute displaced fractures. Paraspinal and other soft tissues: Negative. Disc levels: Aside from the postsurgical changes at L6/S1, the remaining intervertebral disc spaces are unremarkable. IMPRESSION: 1. No acute displaced cervical or lumbar spine fracture. 2. There are 6 non-rib-bearing lumbar type vertebral bodies. 3. Postsurgical changes at L6/S1 with posterior fusion at that level. Persistent grade 1 anterolisthesis of L6 relative to S1. Electronically Signed   By: Sharlet Salina M.D.   On: 08/26/2019 21:11   CT Lumbar Spine Wo Contrast  Result Date: 08/26/2019 CLINICAL DATA:  Syncope, fell, diffuse pain EXAM: CT CERVICAL and LUMBAR SPINE WITHOUT CONTRAST TECHNIQUE: Multidetector CT imaging of the cervical and lumbar spine was performed without intravenous contrast. Multiplanar CT image reconstructions were also generated. COMPARISON:  09/04/2018 FINDINGS: CT CERVICAL SPINE FINDINGS Alignment: Alignment is anatomic. Skull base and vertebrae: No acute displaced fractures. Soft tissues and spinal canal: No prevertebral fluid or swelling. No visible canal hematoma. Disc levels: Disc spaces are well preserved. No significant central canal or neural foraminal encroachment. Upper chest: Airway is patent. Visualized portions of the lung apices are clear. Reconstructed images demonstrate no additional findings. CT LUMBAR SPINE FINDINGS Segmentation: There are 6 non-rib-bearing lumbar type vertebral bodies. Alignment: There is been and discectomy at L6/S1 with posterior fusion at that level. Persistent grade 1  anterolisthesis of L6 relative to S1. Vertebrae: No acute displaced fractures. Paraspinal and other soft tissues: Negative. Disc levels: Aside from the postsurgical changes at L6/S1, the remaining intervertebral disc spaces are unremarkable. IMPRESSION: 1. No acute displaced cervical or lumbar spine fracture. 2. There are 6 non-rib-bearing lumbar type vertebral bodies. 3. Postsurgical changes at L6/S1 with posterior fusion at that level. Persistent grade 1 anterolisthesis of L6 relative to S1. Electronically Signed   By: Sharlet Salina M.D.   On: 08/26/2019 21:11   MR BRAIN WO CONTRAST  Result Date: 08/28/2019 CLINICAL DATA:  Seizure, abnormal neuro exam. Additional history provided: Non-responsive episode, possible seizure EXAM: MRI HEAD WITHOUT CONTRAST TECHNIQUE: Multiplanar, multiecho pulse sequences of the brain and surrounding structures were obtained without intravenous contrast. COMPARISON:  Head CT 08/27/2019 FINDINGS: Brain: The examination is intermittently motion degraded. Most notably there is mild/moderate motion degradation  of the axial T1 weighted sequence. There is no evidence of acute infarct. No evidence of intracranial mass. No midline shift or extra-axial fluid collection. No chronic intracranial blood products. No focal parenchymal signal abnormality is identified. The hippocampi are symmetric in size and signal. Cerebral volume is normal for age. Vascular: Flow voids maintained within the proximal large arterial vessels. Skull and upper cervical spine: No focal marrow lesion. Sinuses/Orbits: Visualized orbits demonstrate no acute abnormality. Minimal ethmoid sinus mucosal thickening. No significant mastoid effusion. IMPRESSION: Intermittently motion degraded examination. Unremarkable MRI appearance of the brain for age. No evidence of acute intracranial abnormality on this noncontrast examination. No specific seizure focus is identified. Electronically Signed   By: Jackey Loge DO   On:  08/28/2019 12:17   MR LUMBAR SPINE WO CONTRAST  Result Date: 08/27/2019 CLINICAL DATA:  Chronic back pain, weakness EXAM: MRI LUMBAR SPINE WITHOUT CONTRAST TECHNIQUE: Multiplanar, multisequence MR imaging of the lumbar spine was performed. No intravenous contrast was administered. COMPARISON:  CT 08/26/2019, MRI 12/26/2018 FINDINGS: Segmentation: 5 lumbar type vertebral bodies as designated on prior MRI report of 12/26/2018 with the operative level being labeled as L5-S1. Alignment:  Chronic grade 2 anterolisthesis L5 on S1. Vertebrae: Since the previous MRI, there has been posterior and interbody fusion at L5-S1 with posterior decompression. No fracture, evidence of discitis, or bone lesion. Conus medullaris and cauda equina: Conus extends to the T12-L1 level. Conus and cauda equina appear normal. Paraspinal and other soft tissues: Negative. Disc levels: T12-L1: Unremarkable. L1-L2: Unremarkable. L2-L3: Unremarkable. L3-L4: Unremarkable. L4-L5: Unremarkable disc. Mild epidural lipomatosis, which appears slightly less prominent compared to prior. No foraminal or canal stenosis. L5-S1: Interval fusion and posterior decompression. Evaluation of this level is largely degraded by metallic susceptibility artifact. Bilateral foramina appear to be somewhat improved, although are largely obscured. Epidural lipomatosis is partially visualized. IMPRESSION: 1. Interval L5-S1 posterior and interbody fusion with posterior decompression. Evaluation of this level is degraded by metallic susceptibility artifact. Bilateral L5-S1 foramina appear somewhat improved, although are largely obscured by metallic susceptibility artifact. 2. Epidural lipomatosis at L4-5 and L5-S1, which appears slightly less prominent compared to prior. 3. Remaining vertebral levels are unremarkable without foraminal or canal stenosis. Electronically Signed   By: Duanne Guess D.O.   On: 08/27/2019 13:15   DG Chest Port 1 View  Result Date:  08/27/2019 CLINICAL DATA:  Unresponsive EXAM: PORTABLE CHEST 1 VIEW COMPARISON:  05/21/2019 FINDINGS: Patient rotated minimally left. Midline trachea. Normal heart size for level of inspiration. Mildly degraded exam due to AP portable technique and patient body habitus. No pleural fluid. Low lung volumes with resultant pulmonary interstitial prominence. No lobar consolidation. No overt congestive failure. IMPRESSION: Low lung volumes, without definite acute disease. Electronically Signed   By: Jeronimo Greaves M.D.   On: 08/27/2019 13:51   EEG adult  Result Date: 08/28/2019 Charlsie Quest, MD     08/29/2019  9:36 AM Patient Name: Carolyn Castro MRN: [id number] Epilepsy Attending: Charlsie Quest Referring Physician/Provider: Dr. Chaya Jan Date: 08/28/2019 Duration: 24.59 mins Patient history: 39 year old female presented ED with headache confusion and bilateral lower extremity pain with weakness.  EEG to evaluate for seizures. Level of alertness: Awake AEDs during EEG study: None Technical aspects: This EEG study was done with scalp electrodes positioned according to the 10-20 International system of electrode placement. Electrical activity was acquired at a sampling rate of 500Hz  and reviewed with a high frequency filter of 70Hz  and a low frequency  filter of 1Hz . EEG data were recorded continuously and digitally stored. Description: The posterior dominant rhythm consists of 11 Hz activity of moderate voltage (25-35 uV) seen predominantly in posterior head regions, symmetric and reactive to eye opening and eye closing. Hyperventilation and photic stimulation were not performed. Of note, study was technically difficult due to significant electrode artifact. IMPRESSION: This technically difficult study is within normal limits. No seizures or epileptiform discharges were seen throughout the recording. Lora Havens   ECHOCARDIOGRAM COMPLETE  Result Date: 08/28/2019    ECHOCARDIOGRAM REPORT    Patient Name:   Carolyn Castro Date of Exam: 08/28/2019 Medical Rec #:  [id number]         Height:       64.0 in Accession #:    [id number]        Weight:       270.0 lb Date of Birth:  [birth year]        BSA:          2.223 m Patient Age:    57 years          BP:           150/75 mmHg Patient Gender: F                 HR:           116 bpm. Exam Location:  Inpatient Procedure: 2D Echo Indications:    Syncope R55  History:        Patient has prior history of Echocardiogram examinations, most                 recent 12/27/2018. Risk Factors:Hypertension.  Sonographer:    Mikki Santee RDCS (AE) Referring Phys: [id number] ESTELA Y HERNANDEZ ACOSTA IMPRESSIONS  1. Left ventricular ejection fraction, by estimation, is 65 to 70%. The left ventricle has normal function. The left ventricle has no regional wall motion abnormalities. Left ventricular diastolic parameters were normal.  2. Right ventricular systolic function is normal. The right ventricular size is normal. Tricuspid regurgitation signal is inadequate for assessing PA pressure.  3. The mitral valve is grossly normal. No evidence of mitral valve regurgitation. No evidence of mitral stenosis.  4. The aortic valve is grossly normal. Aortic valve regurgitation is not visualized. No aortic stenosis is present. Comparison(s): A prior study was performed on 12/27/2018. No significant change from prior study. Prior images reviewed side by side. FINDINGS  Left Ventricle: Left ventricular ejection fraction, by estimation, is 65 to 70%. The left ventricle has normal function. The left ventricle has no regional wall motion abnormalities. The left ventricular internal cavity size was normal in size. There is  no left ventricular hypertrophy. Left ventricular diastolic parameters were normal. Right Ventricle: The right ventricular size is normal. No increase in right ventricular wall thickness. Right ventricular systolic function is normal. Tricuspid regurgitation signal is  inadequate for assessing PA pressure. Left Atrium: Left atrial size was normal in size. Right Atrium: Right atrial size was normal in size. Pericardium: Trivial pericardial effusion is present. Presence of pericardial fat pad. Mitral Valve: The mitral valve is grossly normal. No evidence of mitral valve regurgitation. No evidence of mitral valve stenosis. Tricuspid Valve: The tricuspid valve is grossly normal. Tricuspid valve regurgitation is not demonstrated. Aortic Valve: The aortic valve is grossly normal. Aortic valve regurgitation is not visualized. No aortic stenosis is present. Pulmonic Valve: The pulmonic valve was grossly normal. Pulmonic valve regurgitation is not visualized. No evidence of  pulmonic stenosis. Aorta: The aortic root and ascending aorta are structurally normal, with no evidence of dilitation. IAS/Shunts: No atrial level shunt detected by color flow Doppler.  LEFT VENTRICLE PLAX 2D LVIDd:         4.20 cm  Diastology LVIDs:         2.70 cm  LV e' lateral:   10.80 cm/s LV PW:         1.10 cm  LV E/e' lateral: 9.6 LV IVS:        1.10 cm  LV e' medial:    10.70 cm/s LVOT diam:     2.00 cm  LV E/e' medial:  9.7 LV SV:         58 LV SV Index:   26 LVOT Area:     3.14 cm  RIGHT VENTRICLE TAPSE (M-mode): 2.3 cm LEFT ATRIUM           Index       RIGHT ATRIUM          Index LA diam:      3.20 cm 1.44 cm/m  RA Area:     7.21 cm LA Vol (A2C): 20.8 ml 9.36 ml/m  RA Volume:   10.50 ml 4.72 ml/m LA Vol (A4C): 25.5 ml 11.47 ml/m  AORTIC VALVE LVOT Vmax:   118.00 cm/s LVOT Vmean:  77.100 cm/s LVOT VTI:    0.184 m  AORTA Ao Root diam: 3.00 cm MITRAL VALVE MV Area (PHT): 4.06 cm     SHUNTS MV Decel Time: 187 msec     Systemic VTI:  0.18 m MV E velocity: 104.00 cm/s  Systemic Diam: 2.00 cm MV A velocity: 106.00 cm/s MV E/A ratio:  0.98 Lennie Odor MD Electronically signed by Lennie Odor MD Signature Date/Time: 08/28/2019/5:53:52 PM    Final    DG FL GUIDED LUMBAR PUNCTURE  Result Date:  08/28/2019 CLINICAL DATA:  Seizure. EXAM: DIAGNOSTIC LUMBAR PUNCTURE UNDER FLUOROSCOPIC GUIDANCE FLUOROSCOPY TIME:  Fluoroscopy Time:  30 seconds Radiation Exposure Index (if provided by the fluoroscopic device): 15.90 mGy Number of Acquired Spot Images: 1 PROCEDURE: Informed consent was obtained from the patient prior to the procedure, including potential complications of headache, allergy, and pain. With the patient prone, the lower back was prepped with Betadine. 1% Lidocaine was used for local anesthesia. There are 6 non-rib-bearing lumbar type vertebral bodies. For the purposes of this examination the lowest well-formed intervertebral disc is designated L6-S1. Lumbar puncture was performed at the L4-L5 level using a 20 gauge needle with return of clear CSF with an opening pressure of 21 cm water. 10 ml of CSF were obtained for laboratory studies. The patient tolerated the procedure well and there were no apparent complications. IMPRESSION: Technically successful fluoroscopic guided L4-L5 lumbar puncture. No immediate postprocedure complication. Opening pressure 21 cm water. 10 mL of CSF obtained for laboratory studies. Electronically Signed   By: Jackey Loge DO   On: 08/28/2019 10:54       Discharge Exam: Vitals:   08/30/19 0800 08/30/19 0938  BP: 94/68 122/70  Pulse: (!) 113 (!) 102  Resp: (!) 26 (!) 22  Temp: (!) 97.5 F (36.4 C)   SpO2: 96%      General: Pt is alert, awake, not in acute distress Cardiovascular: Tachycardic, regular rhythm, S1/S2 +, no edema Respiratory: CTA bilaterally, no wheezing, no rhonchi, no respiratory distress, no conversational dyspnea  Abdominal: Soft, NT, ND, bowel sounds + Extremities: no edema, no cyanosis Psych: Normal mood  and affect, stable judgement and insight     The results of significant diagnostics from this hospitalization (including imaging, microbiology, ancillary and laboratory) are listed below for reference.     Microbiology: Recent  Results (from the past 240 hour(s))  Blood Culture (routine x 2)     Status: None (Preliminary result)   Collection Time: 08/27/19  1:08 PM   Specimen: BLOOD  Result Value Ref Range Status   Specimen Description BLOOD RIGHT ANTECUBITAL  Final   Special Requests   Final    BOTTLES DRAWN AEROBIC AND ANAEROBIC Blood Culture adequate volume Performed at [facility], [address] [place], [place] [address]    Culture NO GROWTH 2 DAYS  Final   Report Status PENDING  Incomplete  Blood Culture (routine x 2)     Status: None (Preliminary result)   Collection Time: 08/27/19  1:13 PM   Specimen: BLOOD  Result Value Ref Range Status   Specimen Description BLOOD BLOOD LEFT FOREARM  Final   Special Requests   Final    BOTTLES DRAWN AEROBIC AND ANAEROBIC Blood Culture adequate volume Performed at [facility], [address], [place], [place] [address]    Culture NO GROWTH 2 DAYS  Final   Report Status PENDING  Incomplete  Respiratory Panel by RT PCR (Flu A&B, Covid) - Nasopharyngeal Swab     Status: None   Collection Time: 08/27/19  1:16 PM   Specimen: Nasopharyngeal Swab  Result Value Ref Range Status   SARS Coronavirus 2 by RT PCR NEGATIVE NEGATIVE Final    Comment: (NOTE) SARS-CoV-2 target nucleic acids are NOT DETECTED. The SARS-CoV-2 RNA is generally detectable in upper respiratoy specimens during the acute phase of infection. The lowest concentration of SARS-CoV-2 viral copies this assay can detect is 131 copies/mL. A negative result does not preclude SARS-Cov-2 infection and should not be used as the sole basis for treatment or other patient management decisions. A negative result may occur with  improper specimen collection/handling, submission of specimen other than nasopharyngeal swab, presence of viral mutation(s) within the areas targeted by this assay, and inadequate number of viral copies (<131 copies/mL). A negative result must be  combined with clinical observations, patient history, and epidemiological information. The expected result is Negative. Fact Sheet for Patients:  https://www.moore.com/ Fact Sheet for Healthcare Providers:  https://www.young.biz/ This test is not yet ap proved or cleared by the [place] FDA and  has been authorized for detection and/or diagnosis of SARS-CoV-2 by FDA under an Emergency Use Authorization (EUA). This EUA will remain  in effect (meaning this test can be used) for the duration of the COVID-19 declaration under Section 564(b)(1) of the Act, 21 U.S.C. section 360bbb-3(b)(1), unless the authorization is terminated or revoked sooner.    Influenza A by PCR NEGATIVE NEGATIVE Final   Influenza B by PCR NEGATIVE NEGATIVE Final    Comment: (NOTE) The Xpert Xpress SARS-CoV-2/FLU/RSV assay is intended as an aid in  the diagnosis of influenza from Nasopharyngeal swab specimens and  should not be used as a sole basis for treatment. Nasal washings and  aspirates are unacceptable for Xpert Xpress SARS-CoV-2/FLU/RSV  testing. Fact Sheet for Patients: https://www.moore.com/ Fact Sheet for Healthcare Providers: https://www.young.biz/ This test is not yet approved or cleared by the [place] FDA and  has been authorized for detection and/or diagnosis of SARS-CoV-2 by  FDA under an Emergency Use Authorization (EUA). This EUA will remain  in effect (meaning this test  can be used) for the duration of the  Covid-19 declaration under Section 564(b)(1) of the Act, 21  U.S.C. section 360bbb-3(b)(1), unless the authorization is  terminated or revoked. Performed at [facility], [address] [place], [place] [address]   Urine culture     Status: Abnormal   Collection Time: 08/27/19  2:30 PM   Specimen: In/Out Cath Urine  Result Value Ref Range Status   Specimen Description IN/OUT CATH  URINE  Final   Special Requests NONE  Final   Culture 2,000 COLONIES/mL STAPHYLOCOCCUS EPIDERMIDIS (A)  Final   Report Status 08/29/2019 FINAL  Final   Organism ID, Bacteria STAPHYLOCOCCUS EPIDERMIDIS (A)  Final      Susceptibility   Staphylococcus epidermidis - MIC*    CIPROFLOXACIN >=8 RESISTANT Resistant     GENTAMICIN <=0.5 SENSITIVE Sensitive     NITROFURANTOIN <=16 SENSITIVE Sensitive     OXACILLIN >=4 RESISTANT Resistant     TETRACYCLINE >=16 RESISTANT Resistant     VANCOMYCIN 1 SENSITIVE Sensitive     TRIMETH/SULFA 80 RESISTANT Resistant     CLINDAMYCIN >=8 RESISTANT Resistant     RIFAMPIN <=0.5 SENSITIVE Sensitive     Inducible Clindamycin Value in next row Sensitive      NEGATIVEPerformed at [facility], [address] [place], [place] [address]    * 2,000 COLONIES/mL STAPHYLOCOCCUS EPIDERMIDIS  SARS Coronavirus 2 by RT PCR     Status: None   Collection Time: 08/28/19  1:55 AM  Result Value Ref Range Status   SARS Coronavirus 2 NEGATIVE NEGATIVE Final    Comment: (NOTE) Result indicates the ABSENCE of SARS-CoV-2 RNA in the patient specimen.  The lowest concentration of SARS-CoV-2 viral copies this assay can detect in nasopharyngeal swab specimens is 500 copies / mL.  A negative result does not preclude SARS-CoV-2 infection and should not be used as the sole basis for patient management decisions. A negative result may occur with improper specimen collection / handling, submission of a specimen other than nasopharyngeal swab, presence of viral mutation(s) within the areas targeted by this assay, and inadequate number of viral copies (<500 copies / mL) present.  Negative results must be combined with clinical observations, patient history, and epidemiological information.  The expected result is NEGATIVE.  Patient Fact Sheet:  https://wong-henderson.biz/   Provider Fact Sheet:  CheapJackpot.at   This test is not yet  approved or cleared by the [place] FDA and  has been authorized for  detection and/or diagnosis of SARS-CoV-2 by FDA under an Emergency Use Authorization (EUA).  This EUA will remain in effect (meaning this test can be used) for the duration of  the COVID-19 declaration under Section 564(b)(1) of the Act, 21 U.S.C. section 360bbb-3(b)(1), unless the authorization is terminated or revoked sooner Performed at [facility], [address], [place], [place] [address]   CSF culture     Status: None (Preliminary result)   Collection Time: 08/28/19 10:48 AM   Specimen: PATH Cytology CSF; Cerebrospinal Fluid  Result Value Ref Range Status   Specimen Description CSF  Final   Special Requests NONE  Final   Gram Stain NO WBC SEEN NO ORGANISMS SEEN CYTOSPIN SMEAR   Final   Culture   Final    NO GROWTH 2 DAYS Performed at [facility], [address], [place], [place] [address]    Report Status PENDING  Incomplete     Labs: BNP (last 3 results) Recent  Labs    01/02/19 0329 05/22/19 0512  BNP 18.3 11.7   Basic Metabolic Panel: Recent Labs  Lab 08/26/19 2015 08/27/19 1308 08/28/19 0623 08/29/19 0122 08/30/19 0332  NA 138 134* 139 137  --   K 3.9 4.1 3.5 3.7  --   CL 106 100 107 104  --   CO2 20* 23 24 22   --   GLUCOSE 96 106* 97 102*  --   BUN 13 14 8 9   --   CREATININE 0.95 0.91 0.84 0.98 0.83  CALCIUM 8.4* 9.0 8.2* 8.4*  --    Liver Function Tests: Recent Labs  Lab 08/26/19 2015 08/27/19 1308  AST 24 36  ALT 27 34  ALKPHOS 60 69  BILITOT 0.4 0.7  PROT 6.2* 7.7  ALBUMIN 3.1* 3.6   Recent Labs  Lab 08/27/19 1308  LIPASE 23   No results for input(s): AMMONIA in the last 168 hours. CBC: Recent Labs  Lab 08/26/19 2015 08/27/19 1308 08/28/19 0623  WBC 8.9 13.8* 8.3  NEUTROABS 5.5 11.7*  --   HGB 13.1 13.6 12.3  HCT 40.8 41.7 37.9  MCV 91.3 90.8 89.6  PLT 311 316 293   Cardiac Enzymes: No results for input(s): CKTOTAL, CKMB, CKMBINDEX,  TROPONINI in the last 168 hours. BNP: Invalid input(s): POCBNP CBG: No results for input(s): GLUCAP in the last 168 hours. D-Dimer No results for input(s): DDIMER in the last 72 hours. Hgb A1c Recent Labs    08/28/19 1327  HGBA1C 5.6   Lipid Profile No results for input(s): CHOL, HDL, LDLCALC, TRIG, CHOLHDL, LDLDIRECT in the last 72 hours. Thyroid function studies Recent Labs    08/27/19 2322  TSH 4.201   Anemia work up No results for input(s): VITAMINB12, FOLATE, FERRITIN, TIBC, IRON, RETICCTPCT in the last 72 hours. Urinalysis    Component Value Date/Time   COLORURINE STRAW (A) 08/27/2019 1430   APPEARANCEUR CLEAR 08/27/2019 1430   LABSPEC 1.008 08/27/2019 1430   PHURINE 8.0 08/27/2019 1430   GLUCOSEU NEGATIVE 08/27/2019 1430   HGBUR SMALL (A) 08/27/2019 1430   BILIRUBINUR NEGATIVE 08/27/2019 1430   KETONESUR NEGATIVE 08/27/2019 1430   PROTEINUR NEGATIVE 08/27/2019 1430   NITRITE NEGATIVE 08/27/2019 1430   LEUKOCYTESUR NEGATIVE 08/27/2019 1430   Sepsis Labs Invalid input(s): PROCALCITONIN,  WBC,  LACTICIDVEN Microbiology Recent Results (from the past 240 hour(s))  Blood Culture (routine x 2)     Status: None (Preliminary result)   Collection Time: 08/27/19  1:08 PM   Specimen: BLOOD  Result Value Ref Range Status   Specimen Description BLOOD RIGHT ANTECUBITAL  Final   Special Requests   Final    BOTTLES DRAWN AEROBIC AND ANAEROBIC Blood Culture adequate volume Performed at [facility], [address] [place], [place] [address]    Culture NO GROWTH 2 DAYS  Final   Report Status PENDING  Incomplete  Blood Culture (routine x 2)     Status: None (Preliminary result)   Collection Time: 08/27/19  1:13 PM   Specimen: BLOOD  Result Value Ref Range Status   Specimen Description BLOOD BLOOD LEFT FOREARM  Final   Special Requests   Final    BOTTLES DRAWN AEROBIC AND ANAEROBIC Blood Culture adequate volume Performed at [facility], [address] [place], [place] [address]    Culture NO GROWTH 2 DAYS  Final   Report Status PENDING  Incomplete  Respiratory Panel by RT PCR (Flu A&B, Covid) - Nasopharyngeal Swab  Status: None   Collection Time: 08/27/19  1:16 PM   Specimen: Nasopharyngeal Swab  Result Value Ref Range Status   SARS Coronavirus 2 by RT PCR NEGATIVE NEGATIVE Final    Comment: (NOTE) SARS-CoV-2 target nucleic acids are NOT DETECTED. The SARS-CoV-2 RNA is generally detectable in upper respiratoy specimens during the acute phase of infection. The lowest concentration of SARS-CoV-2 viral copies this assay can detect is 131 copies/mL. A negative result does not preclude SARS-Cov-2 infection and should not be used as the sole basis for treatment or other patient management decisions. A negative result may occur with  improper specimen collection/handling, submission of specimen other than nasopharyngeal swab, presence of viral mutation(s) within the areas targeted by this assay, and inadequate number of viral copies (<131 copies/mL). A negative result must be combined with clinical observations, patient history, and epidemiological information. The expected result is Negative. Fact Sheet for Patients:  https://www.moore.com/ Fact Sheet for Healthcare Providers:  https://www.young.biz/ This test is not yet ap proved or cleared by the [place] FDA and  has been authorized for detection and/or diagnosis of SARS-CoV-2 by FDA under an Emergency Use Authorization (EUA). This EUA will remain  in effect (meaning this test can be used) for the duration of the COVID-19 declaration under Section 564(b)(1) of the Act, 21 U.S.C. section 360bbb-3(b)(1), unless the authorization is terminated or revoked sooner.    Influenza A by PCR NEGATIVE NEGATIVE Final   Influenza B by PCR NEGATIVE NEGATIVE Final    Comment: (NOTE) The Xpert Xpress SARS-CoV-2/FLU/RSV  assay is intended as an aid in  the diagnosis of influenza from Nasopharyngeal swab specimens and  should not be used as a sole basis for treatment. Nasal washings and  aspirates are unacceptable for Xpert Xpress SARS-CoV-2/FLU/RSV  testing. Fact Sheet for Patients: https://www.moore.com/ Fact Sheet for Healthcare Providers: https://www.young.biz/ This test is not yet approved or cleared by the [place] FDA and  has been authorized for detection and/or diagnosis of SARS-CoV-2 by  FDA under an Emergency Use Authorization (EUA). This EUA will remain  in effect (meaning this test can be used) for the duration of the  Covid-19 declaration under Section 564(b)(1) of the Act, 21  U.S.C. section 360bbb-3(b)(1), unless the authorization is  terminated or revoked. Performed at [facility], [address] [place], [place] [address]   Urine culture     Status: Abnormal   Collection Time: 08/27/19  2:30 PM   Specimen: In/Out Cath Urine  Result Value Ref Range Status   Specimen Description IN/OUT CATH URINE  Final   Special Requests NONE  Final   Culture 2,000 COLONIES/mL STAPHYLOCOCCUS EPIDERMIDIS (A)  Final   Report Status 08/29/2019 FINAL  Final   Organism ID, Bacteria STAPHYLOCOCCUS EPIDERMIDIS (A)  Final      Susceptibility   Staphylococcus epidermidis - MIC*    CIPROFLOXACIN >=8 RESISTANT Resistant     GENTAMICIN <=0.5 SENSITIVE Sensitive     NITROFURANTOIN <=16 SENSITIVE Sensitive     OXACILLIN >=4 RESISTANT Resistant     TETRACYCLINE >=16 RESISTANT Resistant     VANCOMYCIN 1 SENSITIVE Sensitive     TRIMETH/SULFA 80 RESISTANT Resistant     CLINDAMYCIN >=8 RESISTANT Resistant     RIFAMPIN <=0.5 SENSITIVE Sensitive     Inducible Clindamycin Value in next row Sensitive      NEGATIVEPerformed at [facility], [address], [place], [place] [address]    * 2,000 COLONIES/mL STAPHYLOCOCCUS EPIDERMIDIS  SARS  Coronavirus 2 by RT PCR     Status: None   Collection Time: 08/28/19  1:55 AM  Result Value Ref Range Status   SARS Coronavirus 2 NEGATIVE NEGATIVE Final    Comment: (NOTE) Result indicates the ABSENCE of SARS-CoV-2 RNA in the patient specimen.  The lowest concentration of SARS-CoV-2 viral copies this assay can detect in nasopharyngeal swab specimens is 500 copies / mL.  A negative result does not preclude SARS-CoV-2 infection and should not be used as the sole basis for patient management decisions. A negative result may occur with improper specimen collection / handling, submission of a specimen other than nasopharyngeal swab, presence of viral mutation(s) within the areas targeted by this assay, and inadequate number of viral copies (<500 copies / mL) present.  Negative results must be combined with clinical observations, patient history, and epidemiological information.  The expected result is NEGATIVE.  Patient Fact Sheet:  https://wong-henderson.biz/https://www.fda.gov/media/136287/download   Provider Fact Sheet:  CheapJackpot.athttps://www.fda.gov/media/136285/download   This test is not yet approved or cleared by the [place] FDA and  has been authorized for  detection and/or diagnosis of SARS-CoV-2 by FDA under an Emergency Use Authorization (EUA).  This EUA will remain in effect (meaning this test can be used) for the duration of  the COVID-19 declaration under Section 564(b)(1) of the Act, 21 U.S.C. section 360bbb-3(b)(1), unless the authorization is terminated or revoked sooner Performed at [facility], [address] [place], [place] [address]   CSF culture     Status: None (Preliminary result)   Collection Time: 08/28/19 10:48 AM   Specimen: PATH Cytology CSF; Cerebrospinal Fluid  Result Value Ref Range Status   Specimen Description CSF  Final   Special Requests NONE  Final   Gram Stain NO WBC SEEN NO ORGANISMS SEEN CYTOSPIN SMEAR   Final   Culture   Final    NO GROWTH 2 DAYS Performed  at [facility], [address] [place], [place] [address]    Report Status PENDING  Incomplete     Patient was seen and examined on the day of discharge and was found to be in stable condition. Time coordinating discharge: 35 minutes including assessment and coordination of care, as well as examination of the patient.   SIGNED:  Noralee StainJennifer Davette Nugent, DO [facility] 08/30/2019, 9:50 AM

## 2019-08-30 NOTE — Progress Notes (Signed)
Discharged to home after IV access removed and discharge instructions reviewed with pt.  All questions were answered.

## 2019-08-30 NOTE — Progress Notes (Signed)
Notified Dr Alvino Chapel of MEWS score of 5 and of 2 with no signs or complaints of distress. MD said she is still good to go home and will be discharging her today.

## 2019-08-31 LAB — CSF CULTURE W GRAM STAIN
Culture: NO GROWTH
Gram Stain: NONE SEEN

## 2019-09-01 LAB — CULTURE, BLOOD (ROUTINE X 2)
Culture: NO GROWTH
Culture: NO GROWTH
Special Requests: ADEQUATE
Special Requests: ADEQUATE

## 2019-09-10 ENCOUNTER — Encounter: Payer: Self-pay | Admitting: Pulmonary Disease

## 2019-09-10 ENCOUNTER — Ambulatory Visit (INDEPENDENT_AMBULATORY_CARE_PROVIDER_SITE_OTHER): Payer: 59 | Admitting: Psychiatry

## 2019-09-10 ENCOUNTER — Encounter: Payer: Self-pay | Admitting: Psychiatry

## 2019-09-10 ENCOUNTER — Other Ambulatory Visit: Payer: Self-pay

## 2019-09-10 ENCOUNTER — Ambulatory Visit: Payer: 59 | Admitting: Primary Care

## 2019-09-10 ENCOUNTER — Ambulatory Visit (INDEPENDENT_AMBULATORY_CARE_PROVIDER_SITE_OTHER): Payer: 59 | Admitting: Pulmonary Disease

## 2019-09-10 VITALS — BP 134/86 | HR 120 | Temp 98.4°F | Ht 64.0 in | Wt 302.1 lb

## 2019-09-10 DIAGNOSIS — Z6841 Body Mass Index (BMI) 40.0 and over, adult: Secondary | ICD-10-CM

## 2019-09-10 DIAGNOSIS — R5381 Other malaise: Secondary | ICD-10-CM

## 2019-09-10 DIAGNOSIS — J9611 Chronic respiratory failure with hypoxia: Secondary | ICD-10-CM

## 2019-09-10 DIAGNOSIS — F4001 Agoraphobia with panic disorder: Secondary | ICD-10-CM

## 2019-09-10 DIAGNOSIS — R0602 Shortness of breath: Secondary | ICD-10-CM

## 2019-09-10 DIAGNOSIS — F5105 Insomnia due to other mental disorder: Secondary | ICD-10-CM | POA: Diagnosis not present

## 2019-09-10 DIAGNOSIS — F325 Major depressive disorder, single episode, in full remission: Secondary | ICD-10-CM | POA: Diagnosis not present

## 2019-09-10 DIAGNOSIS — J4540 Moderate persistent asthma, uncomplicated: Secondary | ICD-10-CM | POA: Diagnosis not present

## 2019-09-10 LAB — COMPREHENSIVE METABOLIC PANEL
ALT: 29 U/L (ref 0–35)
AST: 26 U/L (ref 0–37)
Albumin: 3.8 g/dL (ref 3.5–5.2)
Alkaline Phosphatase: 77 U/L (ref 39–117)
BUN: 9 mg/dL (ref 6–23)
CO2: 24 mEq/L (ref 19–32)
Calcium: 9.1 mg/dL (ref 8.4–10.5)
Chloride: 101 mEq/L (ref 96–112)
Creatinine, Ser: 1.05 mg/dL (ref 0.40–1.20)
GFR: 58.87 mL/min — ABNORMAL LOW (ref >60.00)
Glucose, Bld: 93 mg/dL (ref 70–99)
Potassium: 3.7 mEq/L (ref 3.5–5.1)
Sodium: 134 mEq/L — ABNORMAL LOW (ref 135–145)
Total Bilirubin: 0.3 mg/dL (ref 0.2–1.2)
Total Protein: 7.3 g/dL (ref 6.0–8.3)

## 2019-09-10 LAB — CBC WITH DIFFERENTIAL/PLATELET
Basophils Absolute: 0 10*3/uL (ref 0.0–0.1)
Basophils Relative: 0.3 % (ref 0.0–3.0)
Eosinophils Absolute: 0.1 10*3/uL (ref 0.0–0.7)
Eosinophils Relative: 0.7 % (ref 0.0–5.0)
HCT: 38.8 % (ref 36.0–46.0)
Hemoglobin: 13.1 g/dL (ref 12.0–15.0)
Lymphocytes Relative: 22.8 % (ref 12.0–46.0)
Lymphs Abs: 1.9 10*3/uL (ref 0.7–4.0)
MCHC: 33.8 g/dL (ref 30.0–36.0)
MCV: 87.6 fl (ref 78.0–100.0)
Monocytes Absolute: 0.6 10*3/uL (ref 0.1–1.0)
Monocytes Relative: 6.8 % (ref 3.0–12.0)
Neutro Abs: 5.8 10*3/uL (ref 1.4–7.7)
Neutrophils Relative %: 69.4 % (ref 43.0–77.0)
Platelets: 432 10*3/uL — ABNORMAL HIGH (ref 150.0–400.0)
RBC: 4.43 Mil/uL (ref 3.87–5.11)
RDW: 14.4 % (ref 11.5–15.5)
WBC: 8.3 10*3/uL (ref 4.0–10.5)

## 2019-09-10 MED ORDER — ALPRAZOLAM 1 MG PO TABS: 0.5000 mg | ORAL_TABLET | Freq: Two times a day (BID) | ORAL | 3 refills | Status: DC | PRN

## 2019-09-10 MED ORDER — PAROXETINE HCL 30 MG PO TABS: 60.0000 mg | ORAL_TABLET | Freq: Every day | ORAL | 1 refills | Status: DC

## 2019-09-10 MED ORDER — TRAZODONE HCL 50 MG PO TABS: 100.0000 mg | ORAL_TABLET | Freq: Every evening | ORAL | 1 refills | Status: DC | PRN

## 2019-09-10 NOTE — Assessment & Plan Note (Signed)
BMI 51.86 This continues to contribute to shortness of breath  Plan: Patient did discuss weight loss options with primary care Patient may benefit from referral to weight management/surgical intervention

## 2019-09-10 NOTE — Progress Notes (Signed)
Carolyn Castro [id number] [birth year] 39 y.o.    Subjective:   Patient ID:  Carolyn Castro is a 39 y.o. (DOB 01-06-1981) female.  Chief Complaint:  Chief Complaint  Patient presents with  . Follow-up    Medication Management  . Other    Panic disorder with agoraphobia    HPI Carolyn Castro presents to the office today for follow-up of panic disorder and depression.    When seen September 26, 2017 she had previously been switched from sertraline to paroxetine 40 mg for panic attacks.  She had a good response until a recent bout of health problems had noticed a resurgence of panic.  We decided to increase paroxetine to greater than the usual 60 mg daily from 40 mg daily on August 23, 2018.    When seen May 2020.Health gotten better and anxiety has come down.  Increase in paroxetine has helped her also.  No med changes were made.  She called back in June saying that her anxiety and insomnia was worse and she was having to take steroids for asthma.  She was allowed to increase Xanax to 0.5 mg #5 daily.  Encouraged to reduce the dose as soon as possible.  It was discovered that she was also getting Xanax from another provider and she was cautioned about that as being inappropriate. Subsequently she is gotten Xanax from another provider on more than one occasion. She called April 09, 2019 in the catering her anxiety was worse.  She was worked in to an appointment today.  seen April 12, 2019.  She accidentally reduced the dosage a couple of months prior and  and now the anxiety is out of hand again. Therefore  Increase the paroxetine to greater than usual dose at 60 to try to limit the need of Bz.  Increase in paroxetine was previously effective at the higher dose.[place] Kitchen PDMP shows she is getting Xanax 1 mg quantity 30 from PCP, Dr. Shirlean Mylar and oxycodone 5 mg tablets and quantities of 40-60 from [facility].   She last filled a Xanax prescription from our office in March 2020.   Says she's stopped pain meds.   Last fill date 8/27. 3 ER visits since she was last here for shortness of breath  Last seen December 2020.  No meds were changed. Increased paroxetine to 60 and it's helped.     Recent hospitalization for viral infection of unknown origin and passed out.     Overall really good with panic and using Xanax about 1-2 times weeklly.  I feel good about life.  Overall anxity still better with Paxil.  No problems with it.  Likes living at home to get support from parents over her health issues.   Better handling stress and less panic.  Mostly back to normal.  Likes Paxil better than Zoloft.  Not as sig depressive spells either.  Conc and appetite and function is fair.   Early November had pneumonia and otherwise function is good.    Had low back surgery end of June helped.  Back at work since end of August.  Doesn't like her job bc they always have her on edge.  They are demeaning.    Sleeps better with  trazodone.  8-9 hours.  Past psych meds:  Zoloft 200 NR, , paxil 60,  Xanax., hydroxyzine, trazodone helped.  Review of Systems:  Review of Systems  Respiratory: Positive for shortness of breath and wheezing. Negative for cough and chest tightness.  Gastrointestinal: Negative for abdominal pain.  Musculoskeletal: Positive for back pain.  Neurological: Negative for dizziness, tremors and weakness.  Psychiatric/Behavioral: Negative for agitation, behavioral problems, confusion, decreased concentration, dysphoric mood, hallucinations, self-injury, sleep disturbance and suicidal ideas. The patient is not nervous/anxious and is not hyperactive.   asthma about the same.  Medications: I have reviewed the patient's current medications.  Current Outpatient Medications  Medication Sig Dispense Refill  . acetaminophen (TYLENOL) 500 MG tablet Take 1,000 mg by mouth every 6 (six) hours as needed (for pain).     Marland Kitchen albuterol (VENTOLIN HFA) 108 (90 Base) MCG/ACT inhaler  Inhale 2 puffs into the lungs every 6 (six) hours as needed for wheezing or shortness of breath. 8 g 0  . amLODipine (NORVASC) 10 MG tablet Take 10 mg by mouth daily.    . budesonide-formoterol (SYMBICORT) 160-4.5 MCG/ACT inhaler Inhale 2 puffs into the lungs 2 (two) times daily. 1 Inhaler 5  . cholecalciferol (VITAMIN D3) 25 MCG (1000 UT) tablet Take 1,000 Units by mouth daily.    Marland Kitchen CRANBERRY PO Take 1 tablet by mouth 2 (two) times daily.     . cyclobenzaprine (FLEXERIL) 10 MG tablet Take 1 tablet (10 mg total) by mouth 2 (two) times daily as needed for muscle spasms. 10 tablet 0  . diphenhydrAMINE (BENADRYL) 25 MG tablet Take 50 mg by mouth as needed for itching or allergies.     Mariane Baumgarten Sodium (STOOL SOFTENER) 100 MG capsule Take 100 mg by mouth 2 (two) times daily.    . fexofenadine (ALLEGRA) 180 MG tablet Take 180 mg by mouth daily.    . furosemide (LASIX) 40 MG tablet Take 40 mg by mouth daily.    Marland Kitchen gabapentin (NEURONTIN) 300 MG capsule Take 300 mg by mouth 3 (three) times daily as needed (pain).     Marland Kitchen ibuprofen (ADVIL) 600 MG tablet Take 1 tablet (600 mg total) by mouth every 6 (six) hours as needed. 30 tablet 0  . ipratropium-albuterol (DUONEB) 0.5-2.5 (3) MG/3ML SOLN Take 3 mLs by nebulization every 6 (six) hours as needed (sob and wheezing).     Marland Kitchen levothyroxine (SYNTHROID, LEVOTHROID) 75 MCG tablet Take 75 mcg by mouth daily before breakfast.    . meloxicam (MOBIC) 15 MG tablet Take 1 tablet (15 mg total) by mouth daily. (Patient taking differently: Take 15 mg by mouth daily as needed for pain. ) 30 tablet 1  . mometasone (NASONEX) 50 MCG/ACT nasal spray Place 2 sprays into the nose daily.    . montelukast (SINGULAIR) 10 MG tablet Take 10 mg by mouth every evening.     . Multiple Vitamins-Minerals (WOMENS MULTI PO) Take 1 tablet by mouth daily.     . norethindrone-ethinyl estradiol (MICROGESTIN,JUNEL,LOESTRIN) 1-20 MG-MCG tablet Take 1 tablet by mouth daily.    Marland Kitchen omeprazole (PRILOSEC)  20 MG capsule Take 20 mg by mouth 2 (two) times daily.    . ondansetron (ZOFRAN-ODT) 4 MG disintegrating tablet Take 4 mg by mouth daily as needed for nausea.     . Probiotic Product (PROBIOTIC DAILY PO) Take 1 tablet by mouth daily.    Marland Kitchen ALPRAZolam (XANAX) 1 MG tablet Take 0.5 tablets (0.5 mg total) by mouth 2 (two) times daily as needed for anxiety. 30 tablet 3  . PARoxetine (PAXIL) 30 MG tablet Take 2 tablets (60 mg total) by mouth daily. 180 tablet 1  . traZODone (DESYREL) 50 MG tablet Take 2 tablets (100 mg total) by mouth at bedtime as needed  for sleep. 180 tablet 1   No current facility-administered medications for this visit.    Medication Side Effects: None  Allergies:  Allergies  Allergen Reactions  . Ciprofloxacin Hcl Diarrhea and Other (See Comments)    Developed C-Diff    Past Medical History:  Diagnosis Date  . Anxiety   . Asthma   . Back pain   . Constipation   . Fatigue   . Hypertension   . Hypothyroid   . Hypothyroidism     Family History  Problem Relation Age of Onset  . Hypertension Mother   . Hypertension Father     Social History   Socioeconomic History  . Marital status: Single    Spouse name: Not on file  . Number of children: 0  . Years of education: 87  . Highest education level: Not on file  Occupational History  . Not on file  Tobacco Use  . Smoking status: Never Smoker  . Smokeless tobacco: Never Used  Substance and Sexual Activity  . Alcohol use: Never  . Drug use: Never  . Sexual activity: Not Currently  Other Topics Concern  . Not on file  Social History Narrative   Left handed   One story home   Drinks caffeine   Social Determinants of Health   Financial Resource Strain:   . Difficulty of Paying Living Expenses: Not on file  Food Insecurity:   . Worried About Programme researcher, broadcasting/film/video in the Last Year: Not on file  . Ran Out of Food in the Last Year: Not on file  Transportation Needs:   . Lack of Transportation (Medical):  Not on file  . Lack of Transportation (Non-Medical): Not on file  Physical Activity:   . Days of Exercise per Week: Not on file  . Minutes of Exercise per Session: Not on file  Stress:   . Feeling of Stress : Not on file  Social Connections:   . Frequency of Communication with Friends and Family: Not on file  . Frequency of Social Gatherings with Friends and Family: Not on file  . Attends Religious Services: Not on file  . Active Member of Clubs or Organizations: Not on file  . Attends Banker Meetings: Not on file  . Marital Status: Not on file  Intimate Partner Violence:   . Fear of Current or Ex-Partner: Not on file  . Emotionally Abused: Not on file  . Physically Abused: Not on file  . Sexually Abused: Not on file    Past Medical History, Surgical history, Social history, and Family history were reviewed and updated as appropriate.   Please see review of systems for further details on the patient's review from today.   Objective:   Physical Exam:  There were no vitals taken for this visit.  Physical Exam Constitutional:      General: She is not in acute distress.    Appearance: She is well-developed. She is obese.  Neurological:     Mental Status: She is alert and oriented to person, place, and time.     Cranial Nerves: No dysarthria.  Psychiatric:        Attention and Perception: She is attentive. She does not perceive auditory hallucinations.        Mood and Affect: Mood is anxious. Mood is not depressed. Affect is not labile, blunt, angry or inappropriate.        Speech: Speech normal. Speech is not slurred.  Behavior: Behavior normal. Behavior is not slowed or withdrawn.        Thought Content: Thought content normal. Thought content does not include homicidal or suicidal ideation. Thought content does not include homicidal or suicidal plan.        Cognition and Memory: Cognition normal.        Judgment: Judgment normal.     Comments: Insight  intact. No auditory or visual hallucinations. Anxiety better.     Lab Review:     Component Value Date/Time   NA 137 08/29/2019 0122   K 3.7 08/29/2019 0122   CL 104 08/29/2019 0122   CO2 22 08/29/2019 0122   GLUCOSE 102 (H) 08/29/2019 0122   BUN 9 08/29/2019 0122   CREATININE 0.83 08/30/2019 0332   CALCIUM 8.4 (L) 08/29/2019 0122   PROT 7.7 08/27/2019 1308   ALBUMIN 3.6 08/27/2019 1308   AST 36 08/27/2019 1308   ALT 34 08/27/2019 1308   ALKPHOS 69 08/27/2019 1308   BILITOT 0.7 08/27/2019 1308   GFRNONAA >60 08/30/2019 0332   GFRAA >60 08/30/2019 0332       Component Value Date/Time   WBC 8.3 08/28/2019 0623   RBC 4.23 08/28/2019 0623   HGB 12.3 08/28/2019 0623   HCT 37.9 08/28/2019 0623   PLT 293 08/28/2019 0623   MCV 89.6 08/28/2019 0623   MCH 29.1 08/28/2019 0623   MCHC 32.5 08/28/2019 0623   RDW 13.5 08/28/2019 0623   LYMPHSABS 1.3 08/27/2019 1308   MONOABS 0.7 08/27/2019 1308   EOSABS 0.1 08/27/2019 1308   BASOSABS 0.0 08/27/2019 1308    No results found for: POCLITH, LITHIUM   No results found for: PHENYTOIN, PHENOBARB, VALPROATE, CBMZ   .res Assessment: Plan:    Panic disorder with agoraphobia - Plan: PARoxetine (PAXIL) 30 MG tablet, ALPRAZolam (XANAX) 1 MG tablet  Major depressive disorder with single episode, in full remission (HCC) - Plan: PARoxetine (PAXIL) 30 MG tablet  Insomnia due to mental condition - Plan: traZODone (DESYREL) 50 MG tablet    History of severe panic disorder and mild to moderate major depression which did not respond to sertraline 200 mg a day.  She accidentally reduced the dosage prior to last appt and had relapse. We increased the paroxetine and it worked.. Continue paroxetine to greater than usual dose at 60 to try to limit the need of Bz.  It has worked to manage the anxiety and mood.  Increase in paroxetine was effective at further reducing anxiety below the level she had at 40 mg a day.   We discussed the short-term  risks associated with benzodiazepines including sedation and increased fall risk among others.  Discussed long-term side effect risk including dependence, potential withdrawal symptoms, and the potential eventual dose-related risk of dementia.  Disc risk with opiates.  She is not planning on consistent opiates.  Disc risk of increasing the Xanax.  For sleep continue trazodone 50-100 mg HS.  Follow-up 4-6 mos.  She agrees with the plan. No changes without calling.  Meredith Staggers MD, DFAPA  Please see After Visit Summary for patient specific instructions.  Future Appointments  Date Time Provider Department Center  09/10/2019  1:30 PM Coral Ceo, NP [facility] None    No orders of the defined types were placed in this encounter.     -------------------------------

## 2019-09-10 NOTE — Assessment & Plan Note (Signed)
This is likely multifactorial.  Patient status post hospitalization with a negative neurology and ID work-up.  Plan: Patient needs close follow-up with primary care Lab work today Close follow-up with our office with an appointment with Dr. Delton Coombes

## 2019-09-10 NOTE — Assessment & Plan Note (Signed)
Plan: Continue Symbicort 160 Continue DuoNeb nebulized medication Close follow-up with our office with Dr. Delton Coombes

## 2019-09-10 NOTE — Assessment & Plan Note (Signed)
Plan: Continue nighttime oxygen Walk in office today, no oxygen desaturations

## 2019-09-10 NOTE — Patient Instructions (Addendum)
You were seen today by Lauraine Rinne, NP  for:   I am glad you are out of the hospital.  I am glad you are able to follow-up with our office today.  We will change your breathing medications today remain on Symbicort.  I do want you to follow-up closely with primary care.  Please schedule a follow-up with them.  We will also have you follow-up with our office closely to see Dr. Lamonte Sakai.  We will do lab work today to further evaluate your shortness of breath.  If you have persistent worsening shortness of breath or severe chest pain her oxygen level start to drop please present to an emergency room for further evaluation.  Stay safe and take care,  Carolyn Castro   1. Shortness of breath  - Pro b natriuretic peptide (BNP); Future - Comp Met (CMET); Future - CBC with Differential/Platelet; Future  2. Moderate persistent asthma, unspecified whether complicated  Continue Symbicort >>> 2 puffs in the morning right when you wake up, rinse out your mouth after use, 12 hours later 2 puffs, rinse after use >>> Take this daily, no matter what >>> This is not a rescue inhaler   Only use your albuterol as a rescue medication to be used if you can't catch your breath by resting or doing a relaxed purse lip breathing pattern.  - The less you use it, the better it will work when you need it. - Ok to use up to 2 puffs  every 4 hours if you must but call for immediate appointment if use goes up over your usual need - Don't leave home without it !!  (think of it like the spare tire for your car)   Continue DuoNeb nebulized medications as needed  Continue Singulair  3. Class 3 severe obesity due to excess calories with serious comorbidity and body mass index (BMI) of 50.0 to 59.9 in adult [facility])  Schedule appointment with primary care  Discussed with primary care different weight loss strategies, see if there are medications I would recommend or potentially referral to consider surgical interventions  4. Chronic  hypoxemic respiratory failure (HCC)  Walk in office today, he did not have any oxygen desaturations  Can continue nighttime oxygen    We recommend today:  Orders Placed This Encounter  Procedures  . Pro b natriuretic peptide (BNP)    Standing Status:   Future    Standing Expiration Date:   09/09/2020  . Comp Met (CMET)    Standing Status:   Future    Standing Expiration Date:   09/09/2020  . CBC with Differential/Platelet    Standing Status:   Future    Standing Expiration Date:   09/09/2020   Orders Placed This Encounter  Procedures  . Pro b natriuretic peptide (BNP)  . Comp Met (CMET)  . CBC with Differential/Platelet   No orders of the defined types were placed in this encounter.   Follow Up:    Return in about 1 month (around 10/11/2019), or if symptoms worsen or fail to improve, for Follow up with Dr. Lamonte Sakai.  Schedule with RB in open slot on 10/11/2019  Please do your part to reduce the spread of COVID-19:      Reduce your risk of any infection  and COVID19 by using the similar precautions used for avoiding the common cold or flu:  Marland Kitchen Wash your hands often with soap and warm water for at least 20 seconds.  If soap and water  are not readily available, use an alcohol-based hand sanitizer with at least 60% alcohol.  . If coughing or sneezing, cover your mouth and nose by coughing or sneezing into the elbow areas of your shirt or coat, into a tissue or into your sleeve (not your hands). Langley Gauss A MASK when in public  . Avoid shaking hands with others and consider head nods or verbal greetings only. . Avoid touching your eyes, nose, or mouth with unwashed hands.  . Avoid close contact with people who are sick. . Avoid places or events with large numbers of people in one location, like concerts or sporting events. . If you have some symptoms but not all symptoms, continue to monitor at home and seek medical attention if your symptoms worsen. . If you are having a medical  emergency, call 911.   [place] / e-Visit: [email or web address]         [facility]: [facility]: [phone]                   [facility]: [phone]     It is flu season:   >>> Best ways to protect herself from the flu: Receive the yearly flu vaccine, practice good hand hygiene washing with soap and also using hand sanitizer when available, eat a nutritious meals, get adequate rest, hydrate appropriately   Please contact the office if your symptoms worsen or you have concerns that you are not improving.   Thank you for choosing [facility] for your healthcare, and for allowing [place] to partner with you on your healthcare journey. I am thankful to be able to provide care to you today.   Wyn Quaker FNP-C

## 2019-09-10 NOTE — Progress Notes (Signed)
@Patient  ID: , female    DOB: 1980-12-23, 39 y.o.   MRN: [id number]  Chief Complaint  Patient presents with  . Follow-up    2 month f/u. States she is still having issues with SOB.     Referring provider: [id number], MD  HPI:  39 year old never smoker followed in our office for asthma recurrent sinusitis  Past medical history: Hypothyroidism, obesity, hypertension, GERD, depression, panic disorder Smoking history: Never smoker Maintenance: Symbicort 160 Patient of Dr. 30  09/10/2019  - Visit   39 year old female never smoker followed in our office for asthma.  She is followed by Dr. 30.  She is completing a hospital follow-up with our office today.  She is admitted to the hospital on 08/27/2019 and discharged on 08/30/2019.  Hospital discharge summary listed below:  #1. Syncope/episode of unresponsiveness. -Pt is s/p lumbar puncture performed by interventional radiology. Evaluated by neurology who opine neuro exam no evidence of encephalopathy,no clear localization on exam. CT the head was unremarkable. CT of the lumbar spine reveals postsurgical changes, CT of C-spine no acute displaced cervical fracture -LP results per Neurology with elevated protein likely elevated due to prior spinal surgeries. CSF appears noninfectious with recommendation to d/c antivirals and abx  #2. Flulike symptoms. -Respiratory panel negative. Influenza a and B, rapid Covid PCR negative. SARS coronavirus 2 Negative.Cough from asthma -Cont with supportive tx for now  #3. Hypothyroidism. Home medications include Synthroid. TSH within the limits of normal -Cont thyroid replacement   #4. Asthma. Currently compensated. Reports she has needed her as needed home nebulizers more lately. Home medications include as needed nebs and inhalers. -Continue home meds  #5. GERD. Stable at baseline. -Continue PPI  #6. Hypertension. -BP stable at this time  #7.  Morbid obesity. BMI greater than 40. -Recommend diet/lifestyle modification  #8. Tachycardia with tachypnea -HR has been persistently elevated over the past 48hrs with HR into the 120's and tachypnea with RR as high as 30 -CXR reviewed, low lung volumes noted -Improved without any symptoms this morning  #9. Headache -Improved  [facility] discharge note reports that she was treated for the flu with Tamiflu by [facility] prior to hospitalization on 08/27/2019.  Influenza A&B and rapid Covid test were all negative on admission to [facility] on 08/27/2019.  Patient was seen by neurology EEG was negative although found to be a technically difficult study.  She is also seen by infectious disease.  Both neurology and infectious disease work-ups are unremarkable.  08/27/2018 MRI brain shows no acute sinus abnormality.  Some minimal ethmoid sinus mucosal thickening.  Patient continues to have increased shortness of breath requiring increased DuoNeb use.  Patient reports that she is followed up with primary care they did not do any blood work.  Weight is up today.  Primary care wrote patient out of work so she can help build up her strength.  Patient is not reporting any sort of significant physical exercises help improve physical activity.   Questionaires / Pulmonary Flowsheets:   MMRC: mMRC Dyspnea Scale mMRC Score  09/10/2019 3    Tests:   01/02/2019-CTA chest-mild pneumonia seen in left lower and right upper lobes, left lower lobe opacity has segmental appearance favors bacterial pneumonia  12/26/2018-CT head-sinuses.  Nasal sinuses and mastoid air cells are clear, orbits are clear  01/30/2019-pulmonary function test-FVC 2.88 (77% predicted), postbronchodilator ratio 76, postbronchodilator FEV1 2.13 (69% predicted), no positive bronchodilator response, no bronchodilator response, DLCO 23.37 (105% predicted)  01/08/2019-Home sleep study-AHI 4.7, SaO2 low 77% time with saturation less  than 85% was 58 minutes, indicating nocturnal hypoxemia  12/27/2018-echocardiogram-LV ejection fraction 60 to 65%, cavity size was normal   FENO:  No results found for: NITRICOXIDE  PFT: PFT Results Latest Ref Rng & Units 01/30/2019  FVC-Pre L 2.88  FVC-Predicted Pre % 77  FVC-Post L 2.79  FVC-Predicted Post % 74  Pre FEV1/FVC % % 79  Post FEV1/FCV % % 76  FEV1-Pre L 2.27  FEV1-Predicted Pre % 73  FEV1-Post L 2.13  DLCO UNC% % 105  DLCO COR %Predicted % 136  TLC L 3.91  TLC % Predicted % 77  RV % Predicted % 65    WALK:  SIX MIN WALK 09/10/2019  Tech Comments: Patient was only able to complete 1/2 lap due to fatigue and SOB. She also stated that she felt lightheaded. O2 was not needed during or after walk.    Imaging: CT Head Wo Contrast  Result Date: 08/27/2019 CLINICAL DATA:  39 year old female with altered mental status. EXAM: CT HEAD WITHOUT CONTRAST TECHNIQUE: Contiguous axial images were obtained from the base of the skull through the vertex without intravenous contrast. COMPARISON:  Head CT dated 08/26/2019. FINDINGS: Brain: The ventricles and sulci appropriate size for patient's age. The gray-white matter discrimination is preserved. There is no acute intracranial hemorrhage. No mass effect or midline shift. No extra-axial fluid collection. Vascular: No hyperdense vessel or unexpected calcification. Skull: Normal. Negative for fracture or focal lesion. Sinuses/Orbits: No acute finding. Other: None IMPRESSION: Unremarkable noncontrast CT of the brain. Electronically Signed   By: Elgie CollardArash  Radparvar M.D.   On: 08/27/2019 16:35   CT Head Wo Contrast  Result Date: 08/26/2019 CLINICAL DATA:  Syncope, weakness, headache EXAM: CT HEAD WITHOUT CONTRAST TECHNIQUE: Contiguous axial images were obtained from the base of the skull through the vertex without intravenous contrast. COMPARISON:  12/26/2018 FINDINGS: Brain: No acute infarct or hemorrhage. Lateral ventricles and midline  structures are unremarkable. No acute extra-axial fluid collections. No mass effect. Vascular: No hyperdense vessel or unexpected calcification. Skull: Normal. Negative for fracture or focal lesion. Sinuses/Orbits: No acute finding. Other: None IMPRESSION: 1. Stable head CT, no acute process. Electronically Signed   By: Sharlet SalinaMichael  Brown M.D.   On: 08/26/2019 21:04   CT Cervical Spine Wo Contrast  Result Date: 08/26/2019 CLINICAL DATA:  Syncope, fell, diffuse pain EXAM: CT CERVICAL and LUMBAR SPINE WITHOUT CONTRAST TECHNIQUE: Multidetector CT imaging of the cervical and lumbar spine was performed without intravenous contrast. Multiplanar CT image reconstructions were also generated. COMPARISON:  09/04/2018 FINDINGS: CT CERVICAL SPINE FINDINGS Alignment: Alignment is anatomic. Skull base and vertebrae: No acute displaced fractures. Soft tissues and spinal canal: No prevertebral fluid or swelling. No visible canal hematoma. Disc levels: Disc spaces are well preserved. No significant central canal or neural foraminal encroachment. Upper chest: Airway is patent. Visualized portions of the lung apices are clear. Reconstructed images demonstrate no additional findings. CT LUMBAR SPINE FINDINGS Segmentation: There are 6 non-rib-bearing lumbar type vertebral bodies. Alignment: There is been and discectomy at L6/S1 with posterior fusion at that level. Persistent grade 1 anterolisthesis of L6 relative to S1. Vertebrae: No acute displaced fractures. Paraspinal and other soft tissues: Negative. Disc levels: Aside from the postsurgical changes at L6/S1, the remaining intervertebral disc spaces are unremarkable. IMPRESSION: 1. No acute displaced cervical or lumbar spine fracture. 2. There are 6 non-rib-bearing lumbar type vertebral bodies. 3. Postsurgical changes at L6/S1 with posterior fusion at  that level. Persistent grade 1 anterolisthesis of L6 relative to S1. Electronically Signed   By: Sharlet Salina M.D.   On: 08/26/2019  21:11   CT Lumbar Spine Wo Contrast  Result Date: 08/26/2019 CLINICAL DATA:  Syncope, fell, diffuse pain EXAM: CT CERVICAL and LUMBAR SPINE WITHOUT CONTRAST TECHNIQUE: Multidetector CT imaging of the cervical and lumbar spine was performed without intravenous contrast. Multiplanar CT image reconstructions were also generated. COMPARISON:  09/04/2018 FINDINGS: CT CERVICAL SPINE FINDINGS Alignment: Alignment is anatomic. Skull base and vertebrae: No acute displaced fractures. Soft tissues and spinal canal: No prevertebral fluid or swelling. No visible canal hematoma. Disc levels: Disc spaces are well preserved. No significant central canal or neural foraminal encroachment. Upper chest: Airway is patent. Visualized portions of the lung apices are clear. Reconstructed images demonstrate no additional findings. CT LUMBAR SPINE FINDINGS Segmentation: There are 6 non-rib-bearing lumbar type vertebral bodies. Alignment: There is been and discectomy at L6/S1 with posterior fusion at that level. Persistent grade 1 anterolisthesis of L6 relative to S1. Vertebrae: No acute displaced fractures. Paraspinal and other soft tissues: Negative. Disc levels: Aside from the postsurgical changes at L6/S1, the remaining intervertebral disc spaces are unremarkable. IMPRESSION: 1. No acute displaced cervical or lumbar spine fracture. 2. There are 6 non-rib-bearing lumbar type vertebral bodies. 3. Postsurgical changes at L6/S1 with posterior fusion at that level. Persistent grade 1 anterolisthesis of L6 relative to S1. Electronically Signed   By: Sharlet Salina M.D.   On: 08/26/2019 21:11   MR BRAIN WO CONTRAST  Result Date: 08/28/2019 CLINICAL DATA:  Seizure, abnormal neuro exam. Additional history provided: Non-responsive episode, possible seizure EXAM: MRI HEAD WITHOUT CONTRAST TECHNIQUE: Multiplanar, multiecho pulse sequences of the brain and surrounding structures were obtained without intravenous contrast. COMPARISON:  Head CT  08/27/2019 FINDINGS: Brain: The examination is intermittently motion degraded. Most notably there is mild/moderate motion degradation of the axial T1 weighted sequence. There is no evidence of acute infarct. No evidence of intracranial mass. No midline shift or extra-axial fluid collection. No chronic intracranial blood products. No focal parenchymal signal abnormality is identified. The hippocampi are symmetric in size and signal. Cerebral volume is normal for age. Vascular: Flow voids maintained within the proximal large arterial vessels. Skull and upper cervical spine: No focal marrow lesion. Sinuses/Orbits: Visualized orbits demonstrate no acute abnormality. Minimal ethmoid sinus mucosal thickening. No significant mastoid effusion. IMPRESSION: Intermittently motion degraded examination. Unremarkable MRI appearance of the brain for age. No evidence of acute intracranial abnormality on this noncontrast examination. No specific seizure focus is identified. Electronically Signed   By: Jackey Loge DO   On: 08/28/2019 12:17   MR LUMBAR SPINE WO CONTRAST  Result Date: 08/27/2019 CLINICAL DATA:  Chronic back pain, weakness EXAM: MRI LUMBAR SPINE WITHOUT CONTRAST TECHNIQUE: Multiplanar, multisequence MR imaging of the lumbar spine was performed. No intravenous contrast was administered. COMPARISON:  CT 08/26/2019, MRI 12/26/2018 FINDINGS: Segmentation: 5 lumbar type vertebral bodies as designated on prior MRI report of 12/26/2018 with the operative level being labeled as L5-S1. Alignment:  Chronic grade 2 anterolisthesis L5 on S1. Vertebrae: Since the previous MRI, there has been posterior and interbody fusion at L5-S1 with posterior decompression. No fracture, evidence of discitis, or bone lesion. Conus medullaris and cauda equina: Conus extends to the T12-L1 level. Conus and cauda equina appear normal. Paraspinal and other soft tissues: Negative. Disc levels: T12-L1: Unremarkable. L1-L2: Unremarkable. L2-L3:  Unremarkable. L3-L4: Unremarkable. L4-L5: Unremarkable disc. Mild epidural lipomatosis, which appears slightly less  prominent compared to prior. No foraminal or canal stenosis. L5-S1: Interval fusion and posterior decompression. Evaluation of this level is largely degraded by metallic susceptibility artifact. Bilateral foramina appear to be somewhat improved, although are largely obscured. Epidural lipomatosis is partially visualized. IMPRESSION: 1. Interval L5-S1 posterior and interbody fusion with posterior decompression. Evaluation of this level is degraded by metallic susceptibility artifact. Bilateral L5-S1 foramina appear somewhat improved, although are largely obscured by metallic susceptibility artifact. 2. Epidural lipomatosis at L4-5 and L5-S1, which appears slightly less prominent compared to prior. 3. Remaining vertebral levels are unremarkable without foraminal or canal stenosis. Electronically Signed   By: Duanne Guess D.O.   On: 08/27/2019 13:15   DG Chest Port 1 View  Result Date: 08/27/2019 CLINICAL DATA:  Unresponsive EXAM: PORTABLE CHEST 1 VIEW COMPARISON:  05/21/2019 FINDINGS: Patient rotated minimally left. Midline trachea. Normal heart size for level of inspiration. Mildly degraded exam due to AP portable technique and patient body habitus. No pleural fluid. Low lung volumes with resultant pulmonary interstitial prominence. No lobar consolidation. No overt congestive failure. IMPRESSION: Low lung volumes, without definite acute disease. Electronically Signed   By: Jeronimo Greaves M.D.   On: 08/27/2019 13:51   EEG adult  Result Date: 08/28/2019 Charlsie Quest, MD     08/29/2019  9:36 AM Patient Name: MURNA BACKER MRN: [id number] Epilepsy Attending: Charlsie Quest Referring Physician/Provider: Dr. Chaya Jan Date: 08/28/2019 Duration: 24.59 mins Patient history: 39 year old female presented ED with headache confusion and bilateral lower extremity pain with weakness.   EEG to evaluate for seizures. Level of alertness: Awake AEDs during EEG study: None Technical aspects: This EEG study was done with scalp electrodes positioned according to the 10-20 International system of electrode placement. Electrical activity was acquired at a sampling rate of 500Hz  and reviewed with a high frequency filter of 70Hz  and a low frequency filter of 1Hz . EEG data were recorded continuously and digitally stored. Description: The posterior dominant rhythm consists of 11 Hz activity of moderate voltage (25-35 uV) seen predominantly in posterior head regions, symmetric and reactive to eye opening and eye closing. Hyperventilation and photic stimulation were not performed. Of note, study was technically difficult due to significant electrode artifact. IMPRESSION: This technically difficult study is within normal limits. No seizures or epileptiform discharges were seen throughout the recording. Charlsie Quest   ECHOCARDIOGRAM COMPLETE  Result Date: 08/28/2019    ECHOCARDIOGRAM REPORT   Patient Name:   JASAMINE POTTINGER Date of Exam: 08/28/2019 Medical Rec #:  [id number]         Height:       64.0 in Accession #:    [id number]        Weight:       270.0 lb Date of Birth:  1980/10/20        BSA:          2.223 m Patient Age:    38 years          BP:           150/75 mmHg Patient Gender: F                 HR:           116 bpm. Exam Location:  Inpatient Procedure: 2D Echo Indications:    Syncope R55  History:        Patient has prior history of Echocardiogram examinations, most  recent 12/27/2018. Risk Factors:Hypertension.  Sonographer:    Thurman Coyer RDCS (AE) Referring Phys: [id number] ESTELA Y HERNANDEZ ACOSTA IMPRESSIONS  1. Left ventricular ejection fraction, by estimation, is 65 to 70%. The left ventricle has normal function. The left ventricle has no regional wall motion abnormalities. Left ventricular diastolic parameters were normal.  2. Right ventricular systolic function is  normal. The right ventricular size is normal. Tricuspid regurgitation signal is inadequate for assessing PA pressure.  3. The mitral valve is grossly normal. No evidence of mitral valve regurgitation. No evidence of mitral stenosis.  4. The aortic valve is grossly normal. Aortic valve regurgitation is not visualized. No aortic stenosis is present. Comparison(s): A prior study was performed on 12/27/2018. No significant change from prior study. Prior images reviewed side by side. FINDINGS  Left Ventricle: Left ventricular ejection fraction, by estimation, is 65 to 70%. The left ventricle has normal function. The left ventricle has no regional wall motion abnormalities. The left ventricular internal cavity size was normal in size. There is  no left ventricular hypertrophy. Left ventricular diastolic parameters were normal. Right Ventricle: The right ventricular size is normal. No increase in right ventricular wall thickness. Right ventricular systolic function is normal. Tricuspid regurgitation signal is inadequate for assessing PA pressure. Left Atrium: Left atrial size was normal in size. Right Atrium: Right atrial size was normal in size. Pericardium: Trivial pericardial effusion is present. Presence of pericardial fat pad. Mitral Valve: The mitral valve is grossly normal. No evidence of mitral valve regurgitation. No evidence of mitral valve stenosis. Tricuspid Valve: The tricuspid valve is grossly normal. Tricuspid valve regurgitation is not demonstrated. Aortic Valve: The aortic valve is grossly normal. Aortic valve regurgitation is not visualized. No aortic stenosis is present. Pulmonic Valve: The pulmonic valve was grossly normal. Pulmonic valve regurgitation is not visualized. No evidence of pulmonic stenosis. Aorta: The aortic root and ascending aorta are structurally normal, with no evidence of dilitation. IAS/Shunts: No atrial level shunt detected by color flow Doppler.  LEFT VENTRICLE PLAX 2D LVIDd:          4.20 cm  Diastology LVIDs:         2.70 cm  LV e' lateral:   10.80 cm/s LV PW:         1.10 cm  LV E/e' lateral: 9.6 LV IVS:        1.10 cm  LV e' medial:    10.70 cm/s LVOT diam:     2.00 cm  LV E/e' medial:  9.7 LV SV:         58 LV SV Index:   26 LVOT Area:     3.14 cm  RIGHT VENTRICLE TAPSE (M-mode): 2.3 cm LEFT ATRIUM           Index       RIGHT ATRIUM          Index LA diam:      3.20 cm 1.44 cm/m  RA Area:     7.21 cm LA Vol (A2C): 20.8 ml 9.36 ml/m  RA Volume:   10.50 ml 4.72 ml/m LA Vol (A4C): 25.5 ml 11.47 ml/m  AORTIC VALVE LVOT Vmax:   118.00 cm/s LVOT Vmean:  77.100 cm/s LVOT VTI:    0.184 m  AORTA Ao Root diam: 3.00 cm MITRAL VALVE MV Area (PHT): 4.06 cm     SHUNTS MV Decel Time: 187 msec     Systemic VTI:  0.18 m MV E velocity: 104.00 cm/s  Systemic  Diam: 2.00 cm MV A velocity: 106.00 cm/s MV E/A ratio:  0.98 Lennie Odor MD Electronically signed by Lennie Odor MD Signature Date/Time: 08/28/2019/5:53:52 PM    Final    DG FL GUIDED LUMBAR PUNCTURE  Result Date: 08/28/2019 CLINICAL DATA:  Seizure. EXAM: DIAGNOSTIC LUMBAR PUNCTURE UNDER FLUOROSCOPIC GUIDANCE FLUOROSCOPY TIME:  Fluoroscopy Time:  30 seconds Radiation Exposure Index (if provided by the fluoroscopic device): 15.90 mGy Number of Acquired Spot Images: 1 PROCEDURE: Informed consent was obtained from the patient prior to the procedure, including potential complications of headache, allergy, and pain. With the patient prone, the lower back was prepped with Betadine. 1% Lidocaine was used for local anesthesia. There are 6 non-rib-bearing lumbar type vertebral bodies. For the purposes of this examination the lowest well-formed intervertebral disc is designated L6-S1. Lumbar puncture was performed at the L4-L5 level using a 20 gauge needle with return of clear CSF with an opening pressure of 21 cm water. 10 ml of CSF were obtained for laboratory studies. The patient tolerated the procedure well and there were no apparent  complications. IMPRESSION: Technically successful fluoroscopic guided L4-L5 lumbar puncture. No immediate postprocedure complication. Opening pressure 21 cm water. 10 mL of CSF obtained for laboratory studies. Electronically Signed   By: Jackey Loge DO   On: 08/28/2019 10:54    Lab Results:  CBC    Component Value Date/Time   WBC 8.3 08/28/2019 0623   RBC 4.23 08/28/2019 0623   HGB 12.3 08/28/2019 0623   HCT 37.9 08/28/2019 0623   PLT 293 08/28/2019 0623   MCV 89.6 08/28/2019 0623   MCH 29.1 08/28/2019 0623   MCHC 32.5 08/28/2019 0623   RDW 13.5 08/28/2019 0623   LYMPHSABS 1.3 08/27/2019 1308   MONOABS 0.7 08/27/2019 1308   EOSABS 0.1 08/27/2019 1308   BASOSABS 0.0 08/27/2019 1308    BMET    Component Value Date/Time   NA 137 08/29/2019 0122   K 3.7 08/29/2019 0122   CL 104 08/29/2019 0122   CO2 22 08/29/2019 0122   GLUCOSE 102 (H) 08/29/2019 0122   BUN 9 08/29/2019 0122   CREATININE 0.83 08/30/2019 0332   CALCIUM 8.4 (L) 08/29/2019 0122   GFRNONAA >60 08/30/2019 0332   GFRAA >60 08/30/2019 0332    BNP    Component Value Date/Time   BNP 11.7 05/22/2019 0512    ProBNP No results found for: PROBNP  Specialty Problems      Pulmonary Problems   Asthma   Snoring   Allergic rhinitis   Acute recurrent frontal sinusitis   Shortness of breath   Chronic hypoxemic respiratory failure (HCC)      Allergies  Allergen Reactions  . Ciprofloxacin Hcl Diarrhea and Other (See Comments)    Developed C-Diff    Immunization History  Administered Date(s) Administered  . Influenza,inj,Quad PF,6+ Mos 02/20/2019  . Influenza-Unspecified 03/26/2018    Past Medical History:  Diagnosis Date  . Anxiety   . Asthma   . Back pain   . Constipation   . Fatigue   . Hypertension   . Hypothyroid   . Hypothyroidism     Tobacco History: Social History   Tobacco Use  Smoking Status Never Smoker  Smokeless Tobacco Never Used   Counseling given: Yes  Continue to not  smoke  Outpatient Encounter Medications as of 09/10/2019  Medication Sig  . acetaminophen (TYLENOL) 500 MG tablet Take 1,000 mg by mouth every 6 (six) hours as needed (for pain).   Marland Kitchen  albuterol (VENTOLIN HFA) 108 (90 Base) MCG/ACT inhaler Inhale 2 puffs into the lungs every 6 (six) hours as needed for wheezing or shortness of breath.  . ALPRAZolam (XANAX) 1 MG tablet Take 0.5 tablets (0.5 mg total) by mouth 2 (two) times daily as needed for anxiety.  Marland Kitchen amLODipine (NORVASC) 10 MG tablet Take 10 mg by mouth daily.  . budesonide-formoterol (SYMBICORT) 160-4.5 MCG/ACT inhaler Inhale 2 puffs into the lungs 2 (two) times daily.  . cholecalciferol (VITAMIN D3) 25 MCG (1000 UT) tablet Take 1,000 Units by mouth daily.  Marland Kitchen CRANBERRY PO Take 1 tablet by mouth 2 (two) times daily.   . cyclobenzaprine (FLEXERIL) 10 MG tablet Take 1 tablet (10 mg total) by mouth 2 (two) times daily as needed for muscle spasms.  . diphenhydrAMINE (BENADRYL) 25 MG tablet Take 50 mg by mouth as needed for itching or allergies.   Tery Sanfilippo Sodium (STOOL SOFTENER) 100 MG capsule Take 100 mg by mouth 2 (two) times daily.  . fexofenadine (ALLEGRA) 180 MG tablet Take 180 mg by mouth daily.  . furosemide (LASIX) 40 MG tablet Take 40 mg by mouth daily.  Marland Kitchen gabapentin (NEURONTIN) 300 MG capsule Take 300 mg by mouth 3 (three) times daily as needed (pain).   Marland Kitchen ibuprofen (ADVIL) 600 MG tablet Take 1 tablet (600 mg total) by mouth every 6 (six) hours as needed.  Marland Kitchen ipratropium-albuterol (DUONEB) 0.5-2.5 (3) MG/3ML SOLN Take 3 mLs by nebulization every 6 (six) hours as needed (sob and wheezing).   Marland Kitchen levothyroxine (SYNTHROID, LEVOTHROID) 75 MCG tablet Take 75 mcg by mouth daily before breakfast.  . meloxicam (MOBIC) 15 MG tablet Take 1 tablet (15 mg total) by mouth daily. (Patient taking differently: Take 15 mg by mouth daily as needed for pain. )  . mometasone (NASONEX) 50 MCG/ACT nasal spray Place 2 sprays into the nose daily.  . montelukast  (SINGULAIR) 10 MG tablet Take 10 mg by mouth every evening.   . Multiple Vitamins-Minerals (WOMENS MULTI PO) Take 1 tablet by mouth daily.   . norethindrone-ethinyl estradiol (MICROGESTIN,JUNEL,LOESTRIN) 1-20 MG-MCG tablet Take 1 tablet by mouth daily.  Marland Kitchen omeprazole (PRILOSEC) 20 MG capsule Take 20 mg by mouth 2 (two) times daily.  . ondansetron (ZOFRAN-ODT) 4 MG disintegrating tablet Take 4 mg by mouth daily as needed for nausea.   Marland Kitchen PARoxetine (PAXIL) 30 MG tablet Take 2 tablets (60 mg total) by mouth daily.  . Probiotic Product (PROBIOTIC DAILY PO) Take 1 tablet by mouth daily.  . traZODone (DESYREL) 50 MG tablet Take 2 tablets (100 mg total) by mouth at bedtime as needed for sleep.   No facility-administered encounter medications on file as of 09/10/2019.     Review of Systems  Review of Systems  Constitutional: Positive for activity change and fatigue. Negative for fever.  HENT: Negative for congestion, sinus pressure, sinus pain and sore throat.   Respiratory: Positive for shortness of breath. Negative for cough and wheezing.   Cardiovascular: Negative for chest pain, palpitations and leg swelling.  Gastrointestinal: Negative for diarrhea, nausea and vomiting.  Musculoskeletal: Negative for arthralgias.  Neurological: Negative for dizziness.  Psychiatric/Behavioral: Negative for sleep disturbance. The patient is not nervous/anxious.      Physical Exam  BP 134/86   Pulse (!) 120   Temp 98.4 F (36.9 C) (Temporal)   Ht  (1.626 m)   Wt (!) 302 lb 1.6 oz (137 kg)   SpO2 97% Comment: on RA  BMI 51.86 kg/m  Wt Readings from Last 5 Encounters:  09/10/19 (!) 302 lb 1.6 oz (137 kg)  08/26/19 270 lb (122.5 kg)  08/19/19 296 lb (134.3 kg)  08/02/19 298 lb (135.2 kg)  05/09/19 260 lb (117.9 kg)    BMI Readings from Last 5 Encounters:  09/10/19 51.86 kg/m  08/26/19 46.35 kg/m  08/19/19 50.81 kg/m  08/02/19 51.15 kg/m  05/09/19 44.63 kg/m     Physical  Exam Vitals and nursing note reviewed.  Constitutional:      General: She is not in acute distress.    Appearance: Normal appearance. She is obese.  HENT:     Head: Normocephalic and atraumatic.     Right Ear: External ear normal.     Left Ear: External ear normal.     Nose: Nose normal. No congestion or rhinorrhea.     Mouth/Throat:     Mouth: Mucous membranes are moist.     Pharynx: Oropharynx is clear.  Eyes:     Pupils: Pupils are equal, round, and reactive to light.  Cardiovascular:     Rate and Rhythm: Normal rate and regular rhythm.     Pulses: Normal pulses.     Heart sounds: Normal heart sounds. No murmur.     Comments: Diminished heart tones Pulmonary:     Breath sounds: No decreased air movement. No decreased breath sounds, wheezing or rales.     Comments: Diminished breath sounds the entire exam, likely due to body habitus Musculoskeletal:     Cervical back: Normal range of motion.     Right lower leg: Edema (1+) present.     Left lower leg: Edema (1+) present.  Skin:    General: Skin is warm and dry.     Capillary Refill: Capillary refill takes less than 2 seconds.  Neurological:     General: No focal deficit present.     Mental Status: She is alert and oriented to person, place, and time. Mental status is at baseline.     Gait: Gait normal.  Psychiatric:        Mood and Affect: Mood normal.        Behavior: Behavior normal.        Thought Content: Thought content normal.        Judgment: Judgment normal.       Assessment & Plan:   Discussion: Unsure what may have caused patient's suspected seizure.  Patient does not need outpatient neurology or infectious disease follow-up per documentation from the hospital.  Patient does need close follow-up with primary care..  She can always be referred back to neurology at the discretion of patient's primary care provider.  I have requested the patient that she keep close follow-up with primary care.  She can also  follow-up with primary care regarding weight loss options as I believe her increasing weight continues to be a barrier to successfully managing her breathing.  Obesity BMI 51.86 This continues to contribute to shortness of breath  Plan: Patient did discuss weight loss options with primary care Patient may benefit from referral to weight management/surgical intervention  Shortness of breath This is likely multifactorial.  Patient status post hospitalization with a negative neurology and ID work-up.  Plan: Patient needs close follow-up with primary care Lab work today Close follow-up with our office with an appointment with Dr. Lamonte Sakai  Asthma Plan: Continue Symbicort 160 Continue DuoNeb nebulized medication Close follow-up with our office with Dr. Lamonte Sakai  Chronic hypoxemic respiratory failure ([place]) Plan: Continue nighttime  oxygen Walk in office today, no oxygen desaturations  Physical deconditioning Likely believe that some of patient's shortness of breath is directly related to physical deconditioning Patient's BMI 51.8 Patient is retaining fluid/has increased weight over the last 2 weeks Recent hospitalization  Plan: Patient to work on slowly increasing her physical activity Walk today in office, patient walk short distance without oxygen desaturation    Return in about 1 month (around 10/11/2019), or if symptoms worsen or fail to improve, for Follow up with Dr. Delton Coombes.   Coral Ceo, NP 09/10/2019   This appointment required 34 minutes of patient care (this includes precharting, chart review, review of results, face-to-face care, etc.).

## 2019-09-10 NOTE — Assessment & Plan Note (Signed)
Likely believe that some of patient's shortness of breath is directly related to physical deconditioning Patient's BMI 51.8 Patient is retaining fluid/has increased weight over the last 2 weeks Recent hospitalization  Plan: Patient to work on slowly increasing her physical activity Walk today in office, patient walk short distance without oxygen desaturation

## 2019-09-11 LAB — PRO B NATRIURETIC PEPTIDE: NT-Pro BNP: 24 pg/mL (ref 0–130)

## 2019-10-11 ENCOUNTER — Ambulatory Visit: Payer: 59 | Attending: Internal Medicine

## 2019-10-11 ENCOUNTER — Ambulatory Visit (INDEPENDENT_AMBULATORY_CARE_PROVIDER_SITE_OTHER): Payer: 59 | Admitting: Emergency Medicine

## 2019-10-11 ENCOUNTER — Encounter: Payer: Self-pay | Admitting: Emergency Medicine

## 2019-10-11 ENCOUNTER — Encounter: Payer: Self-pay | Admitting: *Deleted

## 2019-10-11 ENCOUNTER — Other Ambulatory Visit: Payer: Self-pay

## 2019-10-11 VITALS — BP 122/76 | HR 104 | Ht 64.0 in | Wt 301.0 lb

## 2019-10-11 DIAGNOSIS — B349 Viral infection, unspecified: Secondary | ICD-10-CM | POA: Insufficient documentation

## 2019-10-11 DIAGNOSIS — R0602 Shortness of breath: Secondary | ICD-10-CM

## 2019-10-11 DIAGNOSIS — Z20822 Contact with and (suspected) exposure to covid-19: Secondary | ICD-10-CM

## 2019-10-11 DIAGNOSIS — J4541 Moderate persistent asthma with (acute) exacerbation: Secondary | ICD-10-CM | POA: Diagnosis not present

## 2019-10-11 DIAGNOSIS — J301 Allergic rhinitis due to pollen: Secondary | ICD-10-CM | POA: Diagnosis not present

## 2019-10-11 DIAGNOSIS — J9611 Chronic respiratory failure with hypoxia: Secondary | ICD-10-CM

## 2019-10-11 NOTE — Assessment & Plan Note (Signed)
Continue your oxygen at night while sleeping.

## 2019-10-11 NOTE — Assessment & Plan Note (Signed)
Patient with acute and chronic complaints.  She has had emesis, nausea, fever, body aches for 2 days. Based on these symptoms associated also with some rhinitis (question chronic) I think she needs COVID-19 testing.  We will send her for testing today.  She knows that she will need to be quarantined until the testing comes back negative

## 2019-10-11 NOTE — Assessment & Plan Note (Signed)
Continue your allergy medications: Nasonex, Singulair, Allegra

## 2019-10-11 NOTE — Patient Instructions (Addendum)
Based on the symptoms that you have been experiencing over the last 7 to 10 days you need to be COVID-19 tested.  In conjunction with this you need to perform self isolation and quarantine until your test results are obtained and are confirmed negative.  Depending on the results are may be medications that would be beneficial although we may be out of the time range to use these. Please continue your Symbicort 2 puffs twice a day.  Rinse and gargle after using. Keep albuterol available use 2 puffs if needed for shortness of breath. Continue your oxygen at night while sleeping. Continue your allergy medications: Nasonex, Singulair, Allegra Telephone visit with either Dr. Delton Coombes or APP in 2 weeks to follow-up.

## 2019-10-11 NOTE — Progress Notes (Signed)
   Subjective:    Patient ID: Carolyn Castro, female    DOB: 29-Apr-1981, 39 y.o.   MRN: [id number]  HPI  ROV 10/11/2019 --39 year old obese woman with hypothyroidism and asthma.  Pulmonary function testing with moderate obstruction incentive inspiratory variation on flow volume loop.  Coexisting restriction.  She has allergic rhinitis and is currently treated with nasonex, singulair,allegra.  Also GERD on omeprazole, some occasional breakthrough.  Asthma currently managed on Symbicort.  She uses albuterol approximately.  PSG showed nocturnal hypoxemia but only mild OSA. Wearing O2  She was admitted for syncope in February 2021, etiology unclear. She is experiencing more exertional shortness of breath, has R upper mid chest discomfort, more prominent for the last two weeks, worse with inhalation. Some relief from ibuprofen. Seems to be worst with exertion and a long work day.   She has had a fever x 2 days, nausea/ emesis x 2 days. Muscle aches in her LE's, chest x 2 weeks. She just finished abx from her PCP started 2 weeks ago, just completed.  The patient did not tell her screeners about the symptoms, is not on N95 isolation here in the office currently  COVID vaccine completed 2 weeks ago    Review of Systems  Constitutional: Negative for unexpected weight change.  HENT: Positive for congestion and sneezing. Negative for ear pain.   Respiratory: Positive for cough, chest tightness and shortness of breath.   Cardiovascular: Positive for chest pain.  Neurological: Negative for headaches.       Objective:   Physical Exam  Vitals:   10/11/19 0939  BP: 122/76  Pulse: (!) 104  SpO2: 95%  Weight: (!) 301 lb (136.5 kg)  Height: 5\' 4"  (1.626 m)   Gen: Pleasant, obese woman, in no distress,  normal affect  ENT: No lesions,  mouth clear,  oropharynx clear, no postnasal drip  Neck: No JVD, no stridor  Lungs: No use of accessory muscles, no crackles or wheezing on normal respiration,  no wheeze on forced expiration  Cardiovascular: RRR, heart sounds normal, no murmur or gallops, no peripheral edema  Musculoskeletal: No deformities, no cyanosis or clubbing  Neuro: alert, awake, non focal  Skin: Warm, no lesions or rash     Assessment & Plan:  Acute viral syndrome Patient with acute and chronic complaints.  She has had emesis, nausea, fever, body aches for 2 days. Based on these symptoms associated also with some rhinitis (question chronic) I think she needs COVID-19 testing.  We will send her for testing today.  She knows that she will need to be quarantined until the testing comes back negative  Allergic rhinitis Continue your allergy medications: Nasonex, Singulair, Allegra  Asthma Please continue your Symbicort 2 puffs twice a day.  Rinse and gargle after using. Keep albuterol available use 2 puffs if needed for shortness of breath. Telephone visit with either Dr. or APP in 2 weeks to follow-up.  Chronic hypoxemic respiratory failure ([facility]) Continue your oxygen at night while sleeping.  Delton Coombes, MD, PhD 10/11/2019, 5:25 PM [facility] [phone] or if no answer [phone]

## 2019-10-11 NOTE — Assessment & Plan Note (Signed)
Please continue your Symbicort 2 puffs twice a day.  Rinse and gargle after using. Keep albuterol available use 2 puffs if needed for shortness of breath. Telephone visit with either Dr. Delton Coombes or APP in 2 weeks to follow-up.

## 2019-10-12 LAB — NOVEL CORONAVIRUS, NAA: SARS-CoV-2, NAA: NOT DETECTED

## 2019-10-12 LAB — SARS-COV-2, NAA 2 DAY TAT

## 2019-10-31 ENCOUNTER — Other Ambulatory Visit: Payer: Self-pay

## 2019-10-31 ENCOUNTER — Encounter: Payer: Self-pay | Admitting: Emergency Medicine

## 2019-10-31 ENCOUNTER — Ambulatory Visit (INDEPENDENT_AMBULATORY_CARE_PROVIDER_SITE_OTHER): Payer: 59 | Admitting: Emergency Medicine

## 2019-10-31 DIAGNOSIS — J4541 Moderate persistent asthma with (acute) exacerbation: Secondary | ICD-10-CM | POA: Diagnosis not present

## 2019-10-31 NOTE — Progress Notes (Signed)
Virtual Visit via Telephone Note  I connected with TABBITHA JANVRIN on 10/31/19 at 10:30 AM EDT by telephone and verified that I am speaking with the correct person using two identifiers.  Location: Patient: Home Provider: Office   I discussed the limitations, risks, security and privacy concerns of performing an evaluation and management service by telephone and the availability of in person appointments. I also discussed with the patient that there may be a patient responsible charge related to this service. The patient expressed understanding and agreed to proceed.   History of Present Illness: 39 year old woman with hypothyroidism and asthma, moderate obstruction and coexisting restriction likely due to obesity.  She has allergic rhinitis and GERD which are contributors.  Finally has nocturnal hypoxemia but only mild OSA, not on CPAP.  I saw her 10/11/2019 at which time she had viral symptoms, muscle aches, nausea, emesis, fever.  We performed Covid testing which was negative.  She has been managed on Symbicort, Nasonex, Singulair, Allegra   Observations/Objective: She was seen in urgent care after our last visit, had a CXR that was clear. She was treated with steroid shot for presumed costochondritis with significant relief of her main residual sx which was R flank / rib pain. She is having increased allergy sx - is on her maintenance meds as above. She is using albuterol less - about 1-2x a week. COVID shots are up to date   Assessment and Plan: Asthma -  Well compensated at this time. Continue Symbicort, albuterol as needed, treatment of her allergic rhinitis  Allergic rhinitis- Continue Nasonex, Singulair, Allegra.  She is having some flaring symptoms this season but overall doing okay.  Could consider formal allergy evaluation at some point going forward depending on persistence  Nocturnal hypoxemia- Continue her supplemental oxygen at night.  Costochondritis-presumed that this was  related to persistent cough- Improved after she was treated with another dose of corticosteroids.  Now off steroids altogether.  Need to continue to work on cough suppression, management of allergies, GERD  Follow Up Instructions: 3 months in office   I discussed the assessment and treatment plan with the patient. The patient was provided an opportunity to ask questions and all were answered. The patient agreed with the plan and demonstrated an understanding of the instructions.   The patient was advised to call back or seek an in-person evaluation if the symptoms worsen or if the condition fails to improve as anticipated.  I provided 15 minutes of non-face-to-face time during this encounter.   Leslye Peer, MD

## 2019-12-21 ENCOUNTER — Emergency Department (HOSPITAL_BASED_OUTPATIENT_CLINIC_OR_DEPARTMENT_OTHER)
Admission: EM | Admit: 2019-12-21 | Discharge: 2019-12-21 | Disposition: A | Discharge status: Home / Self Care | Payer: 59 | Attending: Emergency Medicine | Admitting: Emergency Medicine

## 2019-12-21 ENCOUNTER — Encounter (HOSPITAL_BASED_OUTPATIENT_CLINIC_OR_DEPARTMENT_OTHER): Payer: Self-pay | Admitting: Emergency Medicine

## 2019-12-21 ENCOUNTER — Emergency Department (HOSPITAL_BASED_OUTPATIENT_CLINIC_OR_DEPARTMENT_OTHER): Payer: 59

## 2019-12-21 ENCOUNTER — Other Ambulatory Visit: Payer: Self-pay

## 2019-12-21 DIAGNOSIS — Y9289 Other specified places as the place of occurrence of the external cause: Secondary | ICD-10-CM | POA: Insufficient documentation

## 2019-12-21 DIAGNOSIS — Z881 Allergy status to other antibiotic agents status: Secondary | ICD-10-CM | POA: Diagnosis not present

## 2019-12-21 DIAGNOSIS — Z7951 Long term (current) use of inhaled steroids: Secondary | ICD-10-CM | POA: Insufficient documentation

## 2019-12-21 DIAGNOSIS — W19XXXA Unspecified fall, initial encounter: Secondary | ICD-10-CM | POA: Diagnosis not present

## 2019-12-21 DIAGNOSIS — S8992XA Unspecified injury of left lower leg, initial encounter: Secondary | ICD-10-CM

## 2019-12-21 DIAGNOSIS — S81012A Laceration without foreign body, left knee, initial encounter: Secondary | ICD-10-CM | POA: Insufficient documentation

## 2019-12-21 DIAGNOSIS — I10 Essential (primary) hypertension: Secondary | ICD-10-CM | POA: Diagnosis not present

## 2019-12-21 DIAGNOSIS — Z79890 Hormone replacement therapy (postmenopausal): Secondary | ICD-10-CM | POA: Insufficient documentation

## 2019-12-21 DIAGNOSIS — Y998 Other external cause status: Secondary | ICD-10-CM | POA: Insufficient documentation

## 2019-12-21 DIAGNOSIS — S79912A Unspecified injury of left hip, initial encounter: Secondary | ICD-10-CM | POA: Insufficient documentation

## 2019-12-21 DIAGNOSIS — J45909 Unspecified asthma, uncomplicated: Secondary | ICD-10-CM | POA: Insufficient documentation

## 2019-12-21 DIAGNOSIS — Y9389 Activity, other specified: Secondary | ICD-10-CM | POA: Diagnosis not present

## 2019-12-21 DIAGNOSIS — Z79899 Other long term (current) drug therapy: Secondary | ICD-10-CM | POA: Diagnosis not present

## 2019-12-21 DIAGNOSIS — E039 Hypothyroidism, unspecified: Secondary | ICD-10-CM | POA: Insufficient documentation

## 2019-12-21 DIAGNOSIS — S99912A Unspecified injury of left ankle, initial encounter: Secondary | ICD-10-CM | POA: Diagnosis not present

## 2019-12-21 MED ORDER — IBUPROFEN 400 MG PO TABS
600.0000 mg | ORAL_TABLET | Freq: Once | ORAL | Status: AC
  Administered 2019-12-21: 600 mg via ORAL
  Filled 2019-12-21: qty 1

## 2019-12-21 MED ORDER — CEPHALEXIN 500 MG PO CAPS: 500.0000 mg | ORAL_CAPSULE | Freq: Four times a day (QID) | ORAL | 0 refills | Status: DC

## 2019-12-21 MED ORDER — LACTATED RINGERS IV BOLUS
1000.0000 mL | Freq: Once | INTRAVENOUS | Status: AC
  Administered 2019-12-21: 1000 mL via INTRAVENOUS

## 2019-12-21 MED ORDER — LIDOCAINE 5 % EX PTCH: 1.0000 | MEDICATED_PATCH | CUTANEOUS | 0 refills | Status: DC

## 2019-12-21 MED ORDER — METHOCARBAMOL 750 MG PO TABS: 750.0000 mg | ORAL_TABLET | Freq: Two times a day (BID) | ORAL | 0 refills | Status: DC | PRN

## 2019-12-21 MED ORDER — OXYCODONE-ACETAMINOPHEN 5-325 MG PO TABS: 2.0000 | ORAL_TABLET | Freq: Once | ORAL | Status: DC

## 2019-12-21 MED ORDER — OXYCODONE-ACETAMINOPHEN 5-325 MG PO TABS
2.0000 | ORAL_TABLET | Freq: Once | ORAL | Status: AC
  Administered 2019-12-21: 2 via ORAL
  Filled 2019-12-21: qty 2

## 2019-12-21 MED ORDER — ONDANSETRON 4 MG PO TBDP
4.0000 mg | ORAL_TABLET | Freq: Once | ORAL | Status: AC
  Administered 2019-12-21: 4 mg via ORAL
  Filled 2019-12-21: qty 1

## 2019-12-21 MED ORDER — LIDOCAINE-EPINEPHRINE (PF) 1 %-1:200000 IJ SOLN
INTRAMUSCULAR | Status: AC
  Administered 2019-12-21: 30 mL
  Filled 2019-12-21: qty 30

## 2019-12-21 MED ORDER — LIDOCAINE-EPINEPHRINE (PF) 2 %-1:200000 IJ SOLN: 10.0000 mL | Freq: Once | INTRAMUSCULAR | Status: DC

## 2019-12-21 MED ORDER — SODIUM CHLORIDE 0.9 % IV BOLUS: 1000.0000 mL | Freq: Once | INTRAVENOUS | Status: DC

## 2019-12-21 NOTE — ED Triage Notes (Signed)
Fall today, laceration to L knee. Also c/o L hip pain

## 2019-12-21 NOTE — ED Provider Notes (Signed)
[facility] EMERGENCY DEPARTMENT Provider Note   CSN: [id number] Arrival date & time: 12/21/19  1122     History Chief Complaint  Patient presents with  . Fall    Carolyn Castro is a 39 y.o. female.  HPI      Carolyn Castro is a 39 y.o. female, with a history of anxiety, asthma, HTN, hypothyroidism, obesity, presenting to the ED with injuries from a fall that occurred this morning.  She drove herself to the ED. Patient states she was on a hiking trail, lost her balance, and fell, striking her left knee on a stump in the process.  She does not think there were any pieces impaled into the wound. She is complaining of pain to the left hip and left knee.  Pain is sharp/throbbing, 10/10, nonradiating from these locations.  She additionally endorses some decreased sensation along the left lower leg. Denies anticoagulation.  Denies head injury, neck/back pain, chest pain, shortness of breath, abdominal pain, LOC, weakness, or any other complaints.  Past Medical History:  Diagnosis Date  . Anxiety   . Asthma   . Back pain   . Constipation   . Fatigue   . Hypertension   . Hypothyroid   . Hypothyroidism     Patient Active Problem List   Diagnosis Date Noted  . Acute viral syndrome 10/11/2019  . Physical deconditioning 09/10/2019  . Syncope and collapse 08/28/2019  . Syncope 08/27/2019  . Fever 08/27/2019  . Flu-like symptoms 08/27/2019  . Chronic hypoxemic respiratory failure (HCC) 08/27/2019  . Altered mental status   . Suspected COVID-19 virus infection 08/02/2019  . Shortness of breath 05/09/2019  . Acute recurrent frontal sinusitis 05/09/2019  . Allergic rhinitis 02/20/2019  . Normocytic anemia 01/02/2019  . Hyperglycemia 01/02/2019  . GERD (gastroesophageal reflux disease) 01/02/2019  . Acute paraplegia (HCC) 12/26/2018  . Lumbar stenosis with neurogenic claudication 12/26/2018  . Obesity 12/26/2018  . Essential hypertension 12/26/2018  . Near  syncope 12/26/2018  . Lumbar stenosis 12/26/2018  . Asthma 12/25/2018  . Snoring 12/25/2018  . Low back pain 09/04/2018  . Hypothyroidism 09/04/2018  . Infected incision 07/04/2018  . Adnexal mass 06/29/2018  . Pelvic pain 06/29/2018    Past Surgical History:  Procedure Laterality Date  . BACK SURGERY    . COLONOSCOPY    . ESOPHAGOGASTRODUODENOSCOPY (EGD) WITH PROPOFOL N/A 01/28/2019   Procedure: ESOPHAGOGASTRODUODENOSCOPY (EGD) WITH PROPOFOL;  Surgeon: Carman Ching, MD;  Location: [facility];  Service: Endoscopy;  Laterality: N/A;  . LAPAROSCOPIC OVARIAN CYSTECTOMY Left 06/29/2018   Procedure: LAPAROSCOPIC OVARIAN CYSTECTOMY;  Surgeon: Gerald Leitz, MD;  Location: [facility];  Service: Gynecology;  Laterality: Left;  . TONSILLECTOMY    . WISDOM TOOTH EXTRACTION       OB History    Gravida  0   Para  0   Term  0   Preterm  0   AB  0   Living  0     SAB  0   TAB  0   Ectopic  0   Multiple  0   Live Births  0           Family History  Problem Relation Age of Onset  . Hypertension Mother   . Hypertension Father     Social History   Tobacco Use  . Smoking status: Never Smoker  . Smokeless tobacco: Never Used  Vaping Use  . Vaping Use: Never used  Substance Use  Topics  . Alcohol use: Never  . Drug use: Never    Home Medications Prior to Admission medications   Medication Sig Start Date End Date Taking? Authorizing Provider  acetaminophen (TYLENOL) 500 MG tablet Take 1,000 mg by mouth every 6 (six) hours as needed (for pain).     [provider]  albuterol (VENTOLIN HFA) 108 (90 Base) MCG/ACT inhaler Inhale 2 puffs into the lungs every 6 (six) hours as needed for wheezing or shortness of breath. 03/31/19   Junie Spencer, FNP  ALPRAZolam Prudy Feeler) 1 MG tablet Take 0.5 tablets (0.5 mg total) by mouth 2 (two) times daily as needed for anxiety. 09/10/19   Cottle, Steva Ready., MD  amLODipine (NORVASC) 10 MG tablet Take 10 mg by  mouth daily.    [provider]  budesonide-formoterol (SYMBICORT) 160-4.5 MCG/ACT inhaler Inhale 2 puffs into the lungs 2 (two) times daily. 08/14/19   Leslye Peer, MD  cephALEXin (KEFLEX) 500 MG capsule Take 1 capsule (500 mg total) by mouth 4 (four) times daily. 12/21/19   Yahaira Bruski C, PA-C  cholecalciferol (VITAMIN D3) 25 MCG (1000 UT) tablet Take 1,000 Units by mouth daily.    [provider]  CRANBERRY PO Take 1 tablet by mouth 2 (two) times daily.     [provider]  cyclobenzaprine (FLEXERIL) 10 MG tablet Take 1 tablet (10 mg total) by mouth 2 (two) times daily as needed for muscle spasms. 08/26/19   Luevenia Maxin, Mina A, PA-C  diphenhydrAMINE (BENADRYL) 25 MG tablet Take 50 mg by mouth as needed for itching or allergies.     [provider]  Docusate Sodium (STOOL SOFTENER) 100 MG capsule Take 100 mg by mouth 2 (two) times daily.    [provider]  fexofenadine (ALLEGRA) 180 MG tablet Take 180 mg by mouth daily.    [provider]  furosemide (LASIX) 40 MG tablet Take 40 mg by mouth daily. 01/14/19   [provider]  gabapentin (NEURONTIN) 300 MG capsule Take 300 mg by mouth 3 (three) times daily as needed (pain).  04/17/19   [provider]  ibuprofen (ADVIL) 600 MG tablet Take 1 tablet (600 mg total) by mouth every 6 (six) hours as needed. 08/26/19   Fawze, Mina A, PA-C  ipratropium-albuterol (DUONEB) 0.5-2.5 (3) MG/3ML SOLN Take 3 mLs by nebulization every 6 (six) hours as needed (sob and wheezing).  04/26/19   [provider]  levothyroxine (SYNTHROID, LEVOTHROID) 75 MCG tablet Take 75 mcg by mouth daily before breakfast.    [provider]  lidocaine (LIDODERM) 5 % Place 1 patch onto the skin daily. Remove & Discard patch within 12 hours or as directed by MD 12/21/19   Trudy Kory, Hillard Danker, PA-C  meloxicam (MOBIC) 15 MG tablet Take 1 tablet (15 mg total) by mouth daily. Patient taking differently: Take 15 mg by  mouth daily as needed for pain.  08/26/19   Felecia Shelling, DPM  methocarbamol (ROBAXIN) 750 MG tablet Take 1 tablet (750 mg total) by mouth 2 (two) times daily as needed for muscle spasms (or muscle tightness). 12/21/19   Velina Drollinger C, PA-C  mometasone (NASONEX) 50 MCG/ACT nasal spray Place 2 sprays into the nose daily.    [provider]  montelukast (SINGULAIR) 10 MG tablet Take 10 mg by mouth every evening.     [provider]  Multiple Vitamins-Minerals (WOMENS MULTI PO) Take 1 tablet by mouth daily.     [provider]  norethindrone-ethinyl estradiol (MICROGESTIN,JUNEL,LOESTRIN) 1-20 MG-MCG tablet Take 1 tablet by mouth daily.    [provider]  omeprazole (PRILOSEC) 20 MG capsule Take 20 mg by mouth 2 (two) times daily. 07/21/19   [provider]  ondansetron (ZOFRAN-ODT) 4 MG disintegrating tablet Take 4 mg by mouth daily as needed for nausea.  03/05/19   [provider]  PARoxetine (PAXIL) 30 MG tablet Take 2 tablets (60 mg total) by mouth daily. 09/10/19   Cottle, Steva Ready., MD  Probiotic Product (PROBIOTIC DAILY PO) Take 1 tablet by mouth daily.    [provider]  traZODone (DESYREL) 50 MG tablet Take 2 tablets (100 mg total) by mouth at bedtime as needed for sleep. 09/10/19   Cottle, Steva Ready., MD    Allergies    Ciprofloxacin hcl  Review of Systems   Review of Systems  Constitutional: Negative for diaphoresis.  Eyes: Negative for visual disturbance.  Respiratory: Negative for shortness of breath.   Cardiovascular: Negative for chest pain.  Gastrointestinal: Negative for abdominal pain, nausea and vomiting.  Musculoskeletal: Positive for arthralgias. Negative for back pain and neck pain.  Neurological: Negative for dizziness, weakness and headaches.  All other systems reviewed and are negative.   Physical Exam Updated Vital Signs BP (!) 145/92 (BP Location: Right Arm)   Pulse (!) 118   Temp 98.6 F (37 C) (Oral)    Resp 20   Ht 5\' 4"  (1.626 m)   Wt 136.1 kg   SpO2 100%   BMI 51.49 kg/m   Physical Exam Vitals and nursing note reviewed.  Constitutional:      General: She is not in acute distress.    Appearance: She is well-developed. She is not diaphoretic.  HENT:     Head: Normocephalic and atraumatic.     Mouth/Throat:     Mouth: Mucous membranes are moist.     Pharynx: Oropharynx is clear.  Eyes:     Conjunctiva/sclera: Conjunctivae normal.  Cardiovascular:     Rate and Rhythm: Regular rhythm. Tachycardia present.     Pulses: Normal pulses.          Radial pulses are 2+ on the right side and 2+ on the left side.       Dorsalis pedis pulses are 2+ on the right side and 2+ on the left side.       Posterior tibial pulses are 2+ on the right side and 2+ on the left side.     Comments: Tactile temperature in the extremities appropriate and equal bilaterally. Pulmonary:     Effort: Pulmonary effort is normal. No respiratory distress.  Abdominal:     Palpations: Abdomen is soft.     Tenderness: There is no abdominal tenderness. There is no guarding.  Musculoskeletal:     Cervical back: Neck supple.     Right lower leg: No edema.     Left lower leg: No edema.     Comments: Exam complicated by body habitus. Tenderness to the left anterior and lateral hip without noted deformity or instability.  Pain with attempted range of motion.  No noted color change. Tenderness to the anterior left knee without swelling, deformity, or instability.  Approximately 2 cm laceration overlying the patella with adipose tissue exposed. No tenderness to the rest of the left lower extremity.  Small area of bruising to the right anterior knee.  No noted pain with range of motion.  No deformity, instability, or wounds noted.  No pain, tenderness, or other signs of injury to the rest of the extremities.  Normal motor function intact in all extremities. No midline spinal tenderness.  In fact, no tenderness anywhere  on the neck or back.  Lymphadenopathy:     Cervical: No cervical adenopathy.  Skin:    General: Skin is warm and dry.  Neurological:     Mental Status: She is alert.     Comments: Sensation grossly intact to light touch in the lower extremities bilaterally. No saddle anesthesias. Strength 5/5 in the bilateral lower extremities. Coordination intact. After pain management, patient able to ambulate without assistance.  Psychiatric:        Mood and Affect: Mood and affect normal.        Speech: Speech normal.        Behavior: Behavior normal.          ED Results / Procedures / Treatments   Labs (all labs ordered are listed, but only abnormal results are displayed) Labs Reviewed - No data to display  EKG None  Radiology DG Ankle Complete Left  Result Date: 12/21/2019 CLINICAL DATA:  Ankle pain after a fall EXAM: LEFT ANKLE COMPLETE - 3+ VIEW COMPARISON:  None. FINDINGS: There is no evidence of fracture, dislocation, or joint effusion. There is a small plantar calcaneal enthesophyte. Soft tissue swelling surrounds the ankle. IMPRESSION: Soft tissue swelling without acute fracture or dislocation. Electronically Signed   By: Romona Curls M.D.   On: 12/21/2019 16:44   DG Knee Complete 4 Views Left  Result Date: 12/21/2019 CLINICAL DATA:  Pain after fall EXAM: LEFT KNEE - COMPLETE 4+ VIEW COMPARISON:  None. FINDINGS: Minimal tricompartmental degenerative changes with tiny osteophytes. No fracture, dislocation, or joint effusion. IMPRESSION: No fracture.  Minimal degenerative changes. Electronically Signed   By: Gerome Sam III M.D   On: 12/21/2019 12:31   DG Hip Unilat With Pelvis 2-3 Views Left  Result Date: 12/21/2019 CLINICAL DATA:  Fall with hip pain. Anterior left knee pain with laceration. EXAM: DG HIP (WITH OR WITHOUT PELVIS) 2-3V LEFT COMPARISON:  None. FINDINGS: Surgical changes are seen in the lumbosacral spine. No fracture or dislocation is identified in the left hip.  IMPRESSION: No acute abnormalities.  No fracture or dislocation. Electronically Signed   By: Gerome Sam III M.D   On: 12/21/2019 12:30    Procedures .Marland KitchenLaceration Repair  Date/Time: 12/21/2019 4:00 PM Performed by: Anselm Pancoast, PA-C Authorized by: Anselm Pancoast, PA-C   Consent:    Consent obtained:  Verbal   Consent given by:  Patient   Risks discussed:  Infection, need for additional repair, pain, poor cosmetic result, poor wound healing and retained foreign body Anesthesia (see MAR for exact dosages):    Anesthesia method:  Local infiltration   Local anesthetic:  Lidocaine 1% WITH epi Laceration details:    Location:  Leg   Leg location:  L knee   Length (cm):  2 Repair type:    Repair type:  Intermediate Exploration:    Hemostasis achieved with:  Epinephrine Treatment:    Area cleansed with:  Betadine and saline   Amount of cleaning:  Extensive   Irrigation solution:  Sterile saline   Irrigation method:  Syringe Skin repair:    Repair method:  Sutures   Suture size:  3-0   Suture material:  Prolene   Suture technique:  Horizontal mattress   Number of sutures:  4 Approximation:    Approximation:  Loose  Post-procedure details:    Dressing:  Non-adherent dressing and sterile dressing   Patient tolerance of procedure:  Tolerated well, no immediate complications   (including critical care time)  Medications Ordered in ED Medications  lidocaine-EPINEPHrine (XYLOCAINE W/EPI) 2 %-1:200000 (PF) injection 10 mL (10 mLs Infiltration Not Given 12/21/19 1410)  oxyCODONE-acetaminophen (PERCOCET/ROXICET) 5-325 MG per tablet 2 tablet (2 tablets Oral Given 12/21/19 1327)  ibuprofen (ADVIL) tablet 600 mg (600 mg Oral Given 12/21/19 1327)  ondansetron (ZOFRAN-ODT) disintegrating tablet 4 mg (4 mg Oral Given 12/21/19 1327)  lidocaine-EPINEPHrine (XYLOCAINE-EPINEPHrine) 1 %-1:200000 (PF) injection (30 mLs  Given by Other 12/21/19 1340)  lactated ringers bolus 1,000 mL ( Intravenous  Stopped 12/21/19 1744)    ED Course  I have reviewed the triage vital signs and the nursing notes.  Pertinent labs & imaging results that were available during my care of the patient were reviewed by me and considered in my medical decision making (see chart for details).  Clinical Course as of Dec 20 1916  Sat Dec 21, 2019  1600 Patient reexamined. States her pain is 6/10. She is able to move her left lower extremity more readily, though it is still painful.  She now states her left ankle is beginning to hurt. She has tenderness to the left lateral ankle without noted deformity, swelling, or instability.   [SJ]    Clinical Course User Index [SJ] Levell Tavano C, PA-C   MDM Rules/Calculators/A&P                          Patient presents with left lower extremity pain following a fall while hiking. No evidence of neurovascular compromise. Tachycardic, which initially was thought to be due to pain.  Improved with pain management, but not resolved.  Tachycardia did resolve after IV fluids which leads me to believe patient did have a degree of dehydration driving her tachycardia.  This would be logical given the fact that patient was out hiking and does not usually hike. I personally reviewed and interpreted the patient's x-rays.  No acute osseous abnormality on patient's x-rays. Patient ambulatory prior to discharge without need for assistance.  Orthopedic follow-up as needed. The patient was given instructions for home care as well as return precautions. Patient voices understanding of these instructions, accepts the plan, and is comfortable with discharge.   Vitals:   12/21/19 1251 12/21/19 1456 12/21/19 1600 12/21/19 1839  BP: 136/65 (!) 143/90 135/73 136/90  Pulse: (!) 114 (!) 112 (!) 104 95  Resp: 18 18    Temp:      TempSrc:      SpO2: 98% 93% 97% 100%  Weight:      Height:        Final Clinical Impression(s) / ED Diagnoses Final diagnoses:  Fall, initial encounter    Laceration of left knee, initial encounter  Injury of left knee, initial encounter  Injury of left ankle, initial encounter  Hip injury, left, initial encounter    Rx / DC Orders ED Discharge Orders         Ordered    lidocaine (LIDODERM) 5 %  Every 24 hours     Discontinue  Reprint     12/21/19 1810    methocarbamol (ROBAXIN) 750 MG tablet  2 times daily PRN     Discontinue  Reprint     12/21/19 1810    cephALEXin (KEFLEX) 500 MG capsule  4 times daily  Discontinue  Reprint     12/21/19 1810           Anselm PancoastJoy, Taber Sweetser C, PA-C 12/22/19 1813    Alvira MondaySchlossman, Erin, MD 12/22/19 2204

## 2019-12-21 NOTE — ED Notes (Signed)
Ambulated in hallway to the bathroom.  Tolerated well.

## 2019-12-21 NOTE — ED Notes (Signed)
Pt discharged to home. Discharge instructions have been discussed with patient and/or family members. Pt verbally acknowledges understanding d/c instructions, and endorses comprehension to checkout at registration before leaving.  °

## 2019-12-21 NOTE — Discharge Instructions (Addendum)
You have been seen today for injuries to the left leg. There were no acute abnormalities on the x-rays, including no sign of fracture or dislocation, however, there could be injuries to the soft tissues, such as the ligaments or tendons that are not seen on xrays. There could also be what are called occult fractures that are small fractures not seen on xray. Antiinflammatory medications: Take 600 mg of ibuprofen every 6 hours or 440 mg (over the counter dose) to 500 mg (prescription dose) of naproxen every 12 hours for the next 3 days. After this time, these medications may be used as needed for pain. Take these medications with food to avoid upset stomach. Choose only one of these medications, do not take them together. Acetaminophen (generic for Tylenol): Should you continue to have additional pain while taking the ibuprofen or naproxen, you may add in acetaminophen as needed. Your daily total maximum amount of acetaminophen from all sources should be limited to 4000mg /day for persons without liver problems, or 2000mg /day for those with liver problems. Methocarbamol: Methocarbamol (generic for Robaxin) is a muscle relaxer and can help relieve stiff muscles or muscle spasms.  Do not drive or perform other dangerous activities while taking this medication as it can cause drowsiness as well as changes in reaction time and judgement. Ice: May apply ice to the area over the next 24 hours for 15 minutes at a time to reduce swelling. Elevation: Keep the extremity elevated as often as possible to reduce pain and inflammation. Follow up: If symptoms are improving, you may follow up with your primary care provider for any continued management. If symptoms are not starting to improve within a week, you should follow up with the orthopedic specialist within two weeks. Return: Return to the ED for numbness, weakness, increasing pain, overall worsening symptoms, loss of function, or if symptoms are not improving, you  have tried to follow up with the orthopedic specialist, and have been unable to do so.  For prescription assistance, may try using prescription discount sites or apps, such as goodrx.com or Good Rx smart phone app.   Wound Care - Laceration You may remove the bandage after 24 hours. Clean the wound and surrounding area gently with tap water and mild soap. Rinse well and blot dry. Do not scrub the wound, as this may cause the wound edges to come apart. You may shower, but avoid submerging the wound, such as with a bath or swimming. Clean the wound daily to prevent infection. Do not use cleaners such as hydrogen peroxide or alcohol.   Scar reduction: Application of a topical antibiotic ointment, such as Neosporin, after the wound has begun to close and heal well can decrease scab formation and reduce scarring. After the wound has healed and wound closures have been removed, application of ointments such as Aquaphor can also reduce scar formation.  The key to scar reduction is keeping the skin well hydrated and supple. Drinking plenty of water throughout the day (At least eight 8oz glasses of water a day) is essential to staying well hydrated.  Sun exposure: Keep the wound out of the sun. After the wound has healed, continue to protect it from the sun by wearing protective clothing or applying sunscreen.  Pain: You may use Tylenol, naproxen, or ibuprofen for pain.  Please take all of your antibiotics until finished!   You may develop abdominal discomfort or diarrhea from the antibiotic.  You may help offset this with probiotics which you can buy  or get in yogurt. Do not eat or take the probiotics until 2 hours after your antibiotic.   Suture/staple removal: Return to the ED in 7-10 days for suture removal.  Return to the ED sooner should the wound edges come apart or signs of infection arise, such as spreading redness, puffiness/swelling, pus draining from the wound, severe increase in pain, fever  over 100.30F, or any other major issues.  For prescription assistance, may try using prescription discount sites or apps, such as goodrx.com

## 2019-12-26 ENCOUNTER — Encounter (HOSPITAL_COMMUNITY): Payer: Self-pay | Admitting: Emergency Medicine

## 2019-12-26 ENCOUNTER — Emergency Department (HOSPITAL_COMMUNITY): Payer: 59

## 2019-12-26 ENCOUNTER — Emergency Department (HOSPITAL_COMMUNITY)
Admission: EM | Admit: 2019-12-26 | Discharge: 2019-12-26 | Disposition: A | Discharge status: Home / Self Care | Payer: 59 | Attending: Emergency Medicine | Admitting: Emergency Medicine

## 2019-12-26 DIAGNOSIS — J45909 Unspecified asthma, uncomplicated: Secondary | ICD-10-CM | POA: Insufficient documentation

## 2019-12-26 DIAGNOSIS — E039 Hypothyroidism, unspecified: Secondary | ICD-10-CM | POA: Insufficient documentation

## 2019-12-26 DIAGNOSIS — I10 Essential (primary) hypertension: Secondary | ICD-10-CM | POA: Insufficient documentation

## 2019-12-26 DIAGNOSIS — Z48817 Encounter for surgical aftercare following surgery on the skin and subcutaneous tissue: Secondary | ICD-10-CM | POA: Insufficient documentation

## 2019-12-26 DIAGNOSIS — Z794 Long term (current) use of insulin: Secondary | ICD-10-CM | POA: Diagnosis not present

## 2019-12-26 DIAGNOSIS — M25562 Pain in left knee: Secondary | ICD-10-CM | POA: Diagnosis not present

## 2019-12-26 DIAGNOSIS — Z20822 Contact with and (suspected) exposure to covid-19: Secondary | ICD-10-CM | POA: Insufficient documentation

## 2019-12-26 DIAGNOSIS — M25561 Pain in right knee: Secondary | ICD-10-CM | POA: Diagnosis present

## 2019-12-26 DIAGNOSIS — Z5189 Encounter for other specified aftercare: Secondary | ICD-10-CM

## 2019-12-26 LAB — I-STAT BETA HCG BLOOD, ED (MC, WL, AP ONLY): I-stat hCG, quantitative: <5 m[IU]/mL (ref <5)

## 2019-12-26 LAB — COMPREHENSIVE METABOLIC PANEL
ALT: 38 U/L (ref 0–44)
AST: 29 U/L (ref 15–41)
Albumin: 3.6 g/dL (ref 3.5–5.0)
Alkaline Phosphatase: 75 U/L (ref 38–126)
Anion gap: 10 (ref 5–15)
BUN: 10 mg/dL (ref 6–20)
CO2: 29 mmol/L (ref 22–32)
Calcium: 8.2 mg/dL — ABNORMAL LOW (ref 8.9–10.3)
Chloride: 103 mmol/L (ref 98–111)
Creatinine, Ser: 1.04 mg/dL — ABNORMAL HIGH (ref 0.44–1.00)
GFR calc Af Amer: >60 mL/min (ref >60)
GFR calc non Af Amer: >60 mL/min (ref >60)
Glucose, Bld: 110 mg/dL — ABNORMAL HIGH (ref 70–99)
Potassium: 3.5 mmol/L (ref 3.5–5.1)
Sodium: 142 mmol/L (ref 135–145)
Total Bilirubin: 0.2 mg/dL — ABNORMAL LOW (ref 0.3–1.2)
Total Protein: 7.4 g/dL (ref 6.5–8.1)

## 2019-12-26 LAB — CBC WITH DIFFERENTIAL/PLATELET
Abs Immature Granulocytes: 0.04 10*3/uL (ref 0.00–0.07)
Basophils Absolute: 0 10*3/uL (ref 0.0–0.1)
Basophils Relative: 0 %
Eosinophils Absolute: 0.1 10*3/uL (ref 0.0–0.5)
Eosinophils Relative: 1 %
HCT: 41.2 % (ref 36.0–46.0)
Hemoglobin: 13.4 g/dL (ref 12.0–15.0)
Immature Granulocytes: 0 %
Lymphocytes Relative: 16 %
Lymphs Abs: 1.9 10*3/uL (ref 0.7–4.0)
MCH: 30.2 pg (ref 26.0–34.0)
MCHC: 32.5 g/dL (ref 30.0–36.0)
MCV: 92.8 fL (ref 80.0–100.0)
Monocytes Absolute: 0.7 10*3/uL (ref 0.1–1.0)
Monocytes Relative: 6 %
Neutro Abs: 8.9 10*3/uL — ABNORMAL HIGH (ref 1.7–7.7)
Neutrophils Relative %: 77 %
Platelets: 350 10*3/uL (ref 150–400)
RBC: 4.44 MIL/uL (ref 3.87–5.11)
RDW: 14.4 % (ref 11.5–15.5)
WBC: 11.7 10*3/uL — ABNORMAL HIGH (ref 4.0–10.5)
nRBC: 0 % (ref 0.0–0.2)

## 2019-12-26 LAB — SARS CORONAVIRUS 2 BY RT PCR (HOSPITAL ORDER, PERFORMED IN ~~LOC~~ HOSPITAL LAB): SARS Coronavirus 2: NEGATIVE

## 2019-12-26 LAB — C-REACTIVE PROTEIN: CRP: 10.6 mg/dL — ABNORMAL HIGH (ref <1.0)

## 2019-12-26 LAB — SEDIMENTATION RATE: Sed Rate: 28 mm/hr — ABNORMAL HIGH (ref 0–22)

## 2019-12-26 MED ORDER — SULFAMETHOXAZOLE-TRIMETHOPRIM 800-160 MG PO TABS
1.0000 | ORAL_TABLET | Freq: Two times a day (BID) | ORAL | 0 refills | Status: AC
Start: 2019-12-26 — End: 2020-01-02

## 2019-12-26 MED ORDER — IOHEXOL 300 MG/ML  SOLN
100.0000 mL | Freq: Once | INTRAMUSCULAR | Status: AC | PRN
  Administered 2019-12-26: 100 mL via INTRAVENOUS

## 2019-12-26 NOTE — ED Triage Notes (Signed)
Pt brought in by [facility] for left knee pain and yellow drainage from laceration that occurred over the weekend from a fall. Pt was started on antibiotics.  Vitals: 142/78, 96HR, 94% on RA.

## 2019-12-26 NOTE — ED Provider Notes (Signed)
[facility] DEPT Provider Note   CSN: [id number] Arrival date & time: 12/26/19  1308     History Chief Complaint  Patient presents with  . knee infection    Carolyn Castro is a 39 y.o. female.  39 year old female with past medical history below including asthma, anxiety, hypothyroidism, hypertension, morbid obesity who presents with right knee pain.  Patient was seen here on 6/19 after she fell and cut her left knee on a stump.  Wound was repaired and she was discharged on Keflex which she has been taking.  3 days ago, she began having increased pain at the wound site and pain progressed.  She saw PCP who increased the dose of Keflex but she was told to come to the ED if things worsened.  She reports that her pain has continued and she has seen some yellow drainage from the laceration site.  She also reports multiple other symptoms including fevers up to 101, body aches, sore throat, cough, runny nose, upset stomach, and feeling like she has the flu.  She is fully vaccinated against COVID-19 and denies any sick contacts.  She denies any recent tick bites.  The history is provided by the patient.       Past Medical History:  Diagnosis Date  . Anxiety   . Asthma   . Back pain   . Constipation   . Fatigue   . Hypertension   . Hypothyroid   . Hypothyroidism     Patient Active Problem List   Diagnosis Date Noted  . Acute viral syndrome 10/11/2019  . Physical deconditioning 09/10/2019  . Syncope and collapse 08/28/2019  . Syncope 08/27/2019  . Fever 08/27/2019  . Flu-like symptoms 08/27/2019  . Chronic hypoxemic respiratory failure ([place]) 08/27/2019  . Altered mental status   . Suspected COVID-19 virus infection 08/02/2019  . Shortness of breath 05/09/2019  . Acute recurrent frontal sinusitis 05/09/2019  . Allergic rhinitis 02/20/2019  . Normocytic anemia 01/02/2019  . Hyperglycemia 01/02/2019  . GERD (gastroesophageal reflux disease)  01/02/2019  . Acute paraplegia ([place]) 12/26/2018  . Lumbar stenosis with neurogenic claudication 12/26/2018  . Obesity 12/26/2018  . Essential hypertension 12/26/2018  . Near syncope 12/26/2018  . Lumbar stenosis 12/26/2018  . Asthma 12/25/2018  . Snoring 12/25/2018  . Low back pain 09/04/2018  . Hypothyroidism 09/04/2018  . Infected incision 07/04/2018  . Adnexal mass 06/29/2018  . Pelvic pain 06/29/2018    Past Surgical History:  Procedure Laterality Date  . BACK SURGERY    . COLONOSCOPY    . ESOPHAGOGASTRODUODENOSCOPY (EGD) WITH PROPOFOL N/A 01/28/2019   Procedure: ESOPHAGOGASTRODUODENOSCOPY (EGD) WITH PROPOFOL;  Surgeon: Laurence Spates, MD;  Location: [facility];  Service: Endoscopy;  Laterality: N/A;  . LAPAROSCOPIC OVARIAN CYSTECTOMY Left 06/29/2018   Procedure: LAPAROSCOPIC OVARIAN CYSTECTOMY;  Surgeon: Christophe Louis, MD;  Location: [facility];  Service: Gynecology;  Laterality: Left;  . TONSILLECTOMY    . WISDOM TOOTH EXTRACTION       OB History    Gravida  0   Para  0   Term  0   Preterm  0   AB  0   Living  0     SAB  0   TAB  0   Ectopic  0   Multiple  0   Live Births  0           Family History  Problem Relation Age of Onset  . Hypertension Mother   .  Hypertension Father     Social History   Tobacco Use  . Smoking status: Never Smoker  . Smokeless tobacco: Never Used  Vaping Use  . Vaping Use: Never used  Substance Use Topics  . Alcohol use: Never  . Drug use: Never    Home Medications Prior to Admission medications   Medication Sig Start Date End Date Taking? Authorizing Provider  acetaminophen (TYLENOL) 500 MG tablet Take 1,000 mg by mouth every 6 (six) hours as needed (for pain).    Yes [provider]  albuterol (VENTOLIN HFA) 108 (90 Base) MCG/ACT inhaler Inhale 2 puffs into the lungs every 6 (six) hours as needed for wheezing or shortness of breath. 03/31/19  Yes Hawks, Christy A, FNP  ALPRAZolam  Duanne Moron) 1 MG tablet Take 0.5 tablets (0.5 mg total) by mouth 2 (two) times daily as needed for anxiety. 09/10/19  Yes Cottle, Billey Co., MD  amLODipine (NORVASC) 10 MG tablet Take 10 mg by mouth daily.   Yes [provider]  budesonide-formoterol (SYMBICORT) 160-4.5 MCG/ACT inhaler Inhale 2 puffs into the lungs 2 (two) times daily. 08/14/19  Yes Collene Gobble, MD  Butalbital-APAP-Caffeine 50-300-40 MG CAPS Take 1 capsule by mouth daily as needed (pain).  12/23/19  Yes [provider]  cephALEXin (KEFLEX) 500 MG capsule Take 1 capsule (500 mg total) by mouth 4 (four) times daily. Patient taking differently: Take 500 mg by mouth 4 (four) times daily. Start date : 12/21/19 12/21/19  Yes Joy, Shawn C, PA-C  cholecalciferol (VITAMIN D3) 25 MCG (1000 UT) tablet Take 1,000 Units by mouth daily.   Yes [provider]  CRANBERRY PO Take 1 tablet by mouth 2 (two) times daily.    Yes [provider]  cyclobenzaprine (FLEXERIL) 10 MG tablet Take 1 tablet (10 mg total) by mouth 2 (two) times daily as needed for muscle spasms. 08/26/19  Yes Fawze, Mina A, PA-C  diphenhydrAMINE (BENADRYL) 25 MG tablet Take 50 mg by mouth as needed for itching or allergies.    Yes [provider]  Docusate Sodium (STOOL SOFTENER) 100 MG capsule Take 100 mg by mouth 2 (two) times daily.   Yes [provider]  fexofenadine (ALLEGRA) 180 MG tablet Take 180 mg by mouth daily.   Yes [provider]  furosemide (LASIX) 40 MG tablet Take 40 mg by mouth daily. 01/14/19  Yes [provider]  gabapentin (NEURONTIN) 300 MG capsule Take 300 mg by mouth 3 (three) times daily as needed (pain).  04/17/19  Yes [provider]  ibuprofen (ADVIL) 600 MG tablet Take 1 tablet (600 mg total) by mouth every 6 (six) hours as needed. Patient taking differently: Take 600 mg by mouth every 6 (six) hours as needed for moderate pain.  08/26/19  Yes Fawze, Mina A, PA-C    ipratropium-albuterol (DUONEB) 0.5-2.5 (3) MG/3ML SOLN Take 3 mLs by nebulization every 6 (six) hours as needed (sob and wheezing).  04/26/19  Yes [provider]  levothyroxine (SYNTHROID, LEVOTHROID) 75 MCG tablet Take 75 mcg by mouth daily before breakfast.   Yes [provider]  lidocaine (LIDODERM) 5 % Place 1 patch onto the skin daily. Remove & Discard patch within 12 hours or as directed by MD 12/21/19  Yes Joy, Shawn C, PA-C  meloxicam (MOBIC) 15 MG tablet Take 1 tablet (15 mg total) by mouth daily. Patient taking differently: Take 15 mg by mouth daily as needed for pain.  08/26/19  Yes Daylene Katayama  M, DPM  methocarbamol (ROBAXIN) 750 MG tablet Take 1 tablet (750 mg total) by mouth 2 (two) times daily as needed for muscle spasms (or muscle tightness). 12/21/19  Yes Joy, Shawn C, PA-C  mometasone (NASONEX) 50 MCG/ACT nasal spray Place 2 sprays into the nose daily.   Yes [provider]  montelukast (SINGULAIR) 10 MG tablet Take 10 mg by mouth every evening.    Yes [provider]  Multiple Vitamins-Minerals (WOMENS MULTI PO) Take 1 tablet by mouth daily.    Yes [provider]  norethindrone-ethinyl estradiol (MICROGESTIN,JUNEL,LOESTRIN) 1-20 MG-MCG tablet Take 1 tablet by mouth daily.   Yes [provider]  omeprazole (PRILOSEC) 20 MG capsule Take 20 mg by mouth 2 (two) times daily. 07/21/19  Yes [provider]  ondansetron (ZOFRAN-ODT) 4 MG disintegrating tablet Take 4 mg by mouth daily as needed for nausea.  03/05/19  Yes [provider]  PARoxetine (PAXIL) 30 MG tablet Take 2 tablets (60 mg total) by mouth daily. 09/10/19  Yes Cottle, Billey Co., MD  Probiotic Product (PROBIOTIC DAILY PO) Take 1 tablet by mouth daily.   Yes [provider]  traMADol (ULTRAM) 50 MG tablet Take 50 mg by mouth every 6 (six) hours as needed for moderate pain.  12/20/19  Yes [provider]  traZODone (DESYREL) 50 MG tablet Take  2 tablets (100 mg total) by mouth at bedtime as needed for sleep. 09/10/19  Yes Cottle, Billey Co., MD  sulfamethoxazole-trimethoprim (BACTRIM DS) 800-160 MG tablet Take 1 tablet by mouth 2 (two) times daily for 7 days. 12/26/19 01/02/20  Vedant Shehadeh, Wenda Overland, MD    Allergies    Ciprofloxacin hcl  Review of Systems   Review of Systems All other systems reviewed and are negative except that which was mentioned in HPI  Physical Exam Updated Vital Signs BP 104/75 (BP Location: Left Arm)   Pulse 99   Temp 97.9 F (36.6 C) (Oral)   Resp (!) 21   SpO2 97%   Physical Exam Vitals and nursing note reviewed.  Constitutional:      General: She is not in acute distress.    Appearance: She is well-developed.  HENT:     Head: Normocephalic and atraumatic.  Eyes:     Conjunctiva/sclera: Conjunctivae normal.  Cardiovascular:     Rate and Rhythm: Regular rhythm. Tachycardia present.     Pulses: Normal pulses.     Heart sounds: Normal heart sounds. No murmur heard.   Pulmonary:     Effort: Pulmonary effort is normal.     Breath sounds: Normal breath sounds. No wheezing.  Abdominal:     General: Bowel sounds are normal. There is no distension.     Palpations: Abdomen is soft.     Tenderness: There is no abdominal tenderness.  Musculoskeletal:     Cervical back: Neck supple.     Comments: Exam limited by body habitus; bruising on b/l knees, no obvious L knee swelling or joint effusion compared to R; 2cm wound with sutures in place just below patella without significant drainage or erythema but tender to palpation  Skin:    General: Skin is warm and dry.  Neurological:     Mental Status: She is alert and oriented to person, place, and time.     Comments: Fluent speech  Psychiatric:     Comments: Anxious, tearful     ED Results / Procedures / Treatments   Labs (all labs ordered are listed, but  only abnormal results are displayed) Labs Reviewed  COMPREHENSIVE METABOLIC PANEL -  Abnormal; Notable for the following components:      Result Value   Glucose, Bld 110 (*)    Creatinine, Ser 1.04 (*)    Calcium 8.2 (*)    Total Bilirubin 0.2 (*)    All other components within normal limits  CBC WITH DIFFERENTIAL/PLATELET - Abnormal; Notable for the following components:   WBC 11.7 (*)    Neutro Abs 8.9 (*)    All other components within normal limits  SEDIMENTATION RATE - Abnormal; Notable for the following components:   Sed Rate 28 (*)    All other components within normal limits  C-REACTIVE PROTEIN - Abnormal; Notable for the following components:   CRP 10.6 (*)    All other components within normal limits  SARS CORONAVIRUS 2 BY RT PCR (HOSPITAL ORDER, [place] [facility])  I-STAT BETA HCG BLOOD, ED ([facility], [facility], [facility] ONLY)    EKG None  Radiology CT KNEE LEFT W CONTRAST  Result Date: 12/26/2019 CLINICAL DATA:  Increasing pain after laceration just distal to the patella. EXAM: CT OF THE LEFT KNEE WITH CONTRAST TECHNIQUE: Multidetector CT imaging was performed following the standard protocol during bolus administration of intravenous contrast. CONTRAST:  18m OMNIPAQUE IOHEXOL 300 MG/ML  SOLN COMPARISON:  Radiographs dated 12/21/2019 FINDINGS: Bones/Joint/Cartilage There is no fracture, dislocation, or joint effusion. Tiny marginal osteophytes on the patella. Ligaments Suboptimally assessed by CT. Cruciate and collateral ligaments are normal. Muscles and Tendons Normal. Specifically, the quadriceps tendon and the patellar tendon normal. Soft tissues There is soft tissue edema anterior to the lateral pole of the patella and superficial and deep to the lateral patellofemoral ligament. No definable abscess. This could represent cellulitis or contusion secondary to the recent injury. IMPRESSION: 1. Soft tissue edema anterior to the lateral pole of the patella and superficial and deep to the lateral patellofemoral ligament. This could represent cellulitis or  contusion secondary to the recent injury. 2. Otherwise, normal exam. Electronically Signed   By: JLorriane ShireM.D.   On: 12/26/2019 17:04    Procedures .Suture Removal  Date/Time: 12/26/2019 5:26 PM Performed by: LSharlett Iles MD Authorized by: LSharlett Iles MD   Consent:    Consent obtained:  Verbal   Consent given by:  Patient   Risks discussed:  Pain and wound separation   Alternatives discussed:  Delayed treatment Location:    Location:  Lower extremity   Lower extremity location:  Knee   Knee location:  L knee Procedure details:    Wound appearance:  No signs of infection and good wound healing   Number of sutures removed:  4 Post-procedure details:    Post-removal:  Band-Aid applied   Patient tolerance of procedure:  Tolerated well, no immediate complications   (including critical care time)  Medications Ordered in ED Medications  iohexol (OMNIPAQUE) 300 MG/ML solution 100 mL (100 mLs Intravenous Contrast Given 12/26/19 1644)    ED Course  I have reviewed the triage vital signs and the nursing notes.  Pertinent labs & imaging results that were available during my care of the patient were reviewed by me and considered in my medical decision making (see chart for details).    MDM Rules/Calculators/A&P                          Pt anxious on exam, tearful, tachycardic, afebrile. External exam of  laceration was not overwhelmingly suggestive of infection. Her URI sx suggest she may have other illness simultaneous to knee injury.  I removed her sutures as the wound was healing well and she had no wound dehiscence or drainage after sutures removed.  Lab work shows normal CMP, CBC with WBC 11.7, COVID negative, ESR 28, CRP 10.6.  Obtained contrasted CT of knee to evaluate for fluid collection.  She has no acute joint problem, no joint effusion.  Soft tissue edema anterior to patella and around patella fibular ligament without fluid collection to suggest  abscess.  These changes could be related to the contusion itself or cellulitis.  As she is already on Keflex, will add Bactrim for better coverage.  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  She feels that this may have contributed to worsening knee pain.  I have counseled on the importance of elevation and resting the next several days to ensure that knee is improving prior to return to usual activities.  Discussed wound care.  Have reviewed return precautions and she voiced understanding. Final Clinical Impression(s) / ED Diagnoses Final diagnoses:  Encounter for post-traumatic wound check  Acute pain of left knee    Rx / DC Orders ED Discharge Orders         Ordered    sulfamethoxazole-trimethoprim (BACTRIM DS) 800-160 MG tablet  2 times daily     Discontinue  Reprint     12/26/19 1724           Zaidin Blyden, Wenda Overland, MD 12/26/19 1729

## 2020-01-09 ENCOUNTER — Other Ambulatory Visit: Payer: Self-pay

## 2020-02-03 ENCOUNTER — Encounter: Payer: Self-pay | Admitting: Psychiatry

## 2020-02-03 ENCOUNTER — Ambulatory Visit (INDEPENDENT_AMBULATORY_CARE_PROVIDER_SITE_OTHER): Payer: 59 | Admitting: Psychiatry

## 2020-02-03 ENCOUNTER — Other Ambulatory Visit: Payer: Self-pay

## 2020-02-03 DIAGNOSIS — F325 Major depressive disorder, single episode, in full remission: Secondary | ICD-10-CM | POA: Diagnosis not present

## 2020-02-03 DIAGNOSIS — F5105 Insomnia due to other mental disorder: Secondary | ICD-10-CM | POA: Diagnosis not present

## 2020-02-03 DIAGNOSIS — F4001 Agoraphobia with panic disorder: Secondary | ICD-10-CM | POA: Diagnosis not present

## 2020-02-03 MED ORDER — PAROXETINE HCL 30 MG PO TABS: 60.0000 mg | ORAL_TABLET | Freq: Every day | ORAL | 1 refills | Status: DC

## 2020-02-03 MED ORDER — TRAZODONE HCL 50 MG PO TABS: 100.0000 mg | ORAL_TABLET | Freq: Every day | ORAL | 1 refills | Status: DC

## 2020-02-03 MED ORDER — ALPRAZOLAM 1 MG PO TABS: 1.0000 mg | ORAL_TABLET | Freq: Two times a day (BID) | ORAL | 2 refills | Status: DC | PRN

## 2020-02-03 NOTE — Progress Notes (Signed)
DOLORES EWING [id number] 28-May-1981 39 y.o.    Subjective:   Patient ID:  IRMGARD RAMPERSAUD is a 39 y.o. (DOB 08-17-1980) female.  Chief Complaint:  Chief Complaint  Patient presents with  . Follow-up  . Anxiety    HPI ISMAEL TREPTOW presents to the office today for follow-up of panic disorder and depression.    When seen September 26, 2017 she had previously been switched from sertraline to paroxetine 40 mg for panic attacks.  She had a good response until a recent bout of health problems had noticed a resurgence of panic.  We decided to increase paroxetine to greater than the usual 60 mg daily from 40 mg daily on August 23, 2018.    When seen May 2020.Health gotten better and anxiety has come down.  Increase in paroxetine has helped her also.  No med changes were made.  She called back in June 2020 saying that her anxiety and insomnia was worse and she was having to take steroids for asthma.  She was allowed to increase Xanax to 0.5 mg #5 daily.  Encouraged to reduce the dose as soon as possible.  It was discovered that she was also getting Xanax from another provider and she was cautioned about that as being inappropriate. Subsequently she is gotten Xanax from another provider on more than one occasion. She called April 09, 2019 in the catering her anxiety was worse.  She was worked in to an appointment today.  seen April 12, 2019.  She accidentally reduced the dosage a couple of months prior and  and now the anxiety is out of hand again. Therefore  Increase the paroxetine to greater than usual dose at 60 to try to limit the need of Bz.  Increase in paroxetine was previously effective at the higher dose.[place] PDMP shows she is getting Xanax 1 mg quantity 30 from PCP, Dr. Shirlean Mylar and oxycodone 5 mg tablets and quantities of 40-60 from [facility].   She last filled a Xanax prescription from our office in March 2020.  Says she's stopped pain meds.   Last fill date 8/27. 3  ER visits since she was last here for shortness of breath  Last seen December 2020 & March 2021.  No meds were changed. Increased paroxetine to 60 and it's helped.     02/03/20 appt with the following noted: Consistent with paroxetine 60 since here. Xanax 0.5 mg about twice weekly. Anxiety higher lately.  2 PT jobs including dog sitting.  One of dogs got away and attacked another dog with vet bill.  She had to fork out $1000 to help pay for it.  Realizes she shouldn't have done it.  Got real frutstrated with herself for giving in to their demands. Has done therapy online about twice weekly who is good. Gets numb from stress.  GM died naturally hard.  $ stress.  Louann Sjogren can be demeaning and demanding.  Wants to do international missions but can't DT Covid. Last 2 mos hard bc no recent vacation Dt $ stress as noted with medical bills.  Drained. Sleep well with trazodone. Panic better with paroxetine but not completely stopped but shorter and less severe.  Overall really good with panic and using Xanax about 1-2 times weeklly.    Overall anxiety still better with Paxil.  No problems with it.  Likes living at home to get support from parents over her health issues.   Better handling stress and less panic.  Mostly back  to normal.  Likes Paxil better than Zoloft.  Not as sig depressive spells either.  Conc and appetite and function is fair.   Early November had pneumonia and otherwise function is good.    Had low back surgery end of June 2020 helped.   Sleeps better with  trazodone.  8-9 hours.  Past psych meds:  Zoloft 200 NR & SE, , paxil 60,  Xanax., hydroxyzine, trazodone helped.  Review of Systems:  Review of Systems  Respiratory: Positive for shortness of breath and wheezing. Negative for cough and chest tightness.   Gastrointestinal: Negative for abdominal pain.  Musculoskeletal: Positive for arthralgias and back pain.  Neurological: Negative for dizziness, tremors and weakness.   Psychiatric/Behavioral: Negative for agitation, behavioral problems, confusion, decreased concentration, dysphoric mood, hallucinations, self-injury, sleep disturbance and suicidal ideas. The patient is not nervous/anxious and is not hyperactive.   asthma about the same.  Medications: I have reviewed the patient's current medications.  Current Outpatient Medications  Medication Sig Dispense Refill  . ALPRAZolam (XANAX) 1 MG tablet Take 1 tablet (1 mg total) by mouth 2 (two) times daily as needed for anxiety. 30 tablet 2  . amLODipine (NORVASC) 10 MG tablet Take 10 mg by mouth daily.    . budesonide-formoterol (SYMBICORT) 160-4.5 MCG/ACT inhaler Inhale 2 puffs into the lungs 2 (two) times daily.    . cyclobenzaprine (FLEXERIL) 10 MG tablet Take 1 tablet (10 mg total) by mouth 2 (two) times daily as needed for muscle spasms. 10 tablet 0  . fexofenadine (ALLEGRA) 180 MG tablet Take 180 mg by mouth daily.    Marland Kitchen. gabapentin (NEURONTIN) 300 MG capsule Take 300 mg by mouth 3 (three) times daily.    Marland Kitchen. levothyroxine (SYNTHROID) 75 MCG tablet Take 75 mcg by mouth daily before breakfast.    . meloxicam (MOBIC) 15 MG tablet Take 1 tablet (15 mg total) by mouth daily. (Patient taking differently: Take 15 mg by mouth daily as needed for pain. ) 30 tablet 1  . PARoxetine (PAXIL) 30 MG tablet Take 2 tablets (60 mg total) by mouth daily. 180 tablet 1  . traZODone (DESYREL) 50 MG tablet Take 2 tablets (100 mg total) by mouth at bedtime. 180 tablet 1   No current facility-administered medications for this visit.    Medication Side Effects: None  Allergies:  Allergies  Allergen Reactions  . Ciprofloxacin Hcl Diarrhea and Other (See Comments)    Developed C-Diff    Past Medical History:  Diagnosis Date  . Anxiety   . Asthma   . Back pain   . Constipation   . Fatigue   . Hypertension   . Hypothyroid   . Hypothyroidism     Family History  Problem Relation Age of Onset  . Hypertension Mother   .  Hypertension Father     Social History   Socioeconomic History  . Marital status: Single    Spouse name: Not on file  . Number of children: 0  . Years of education: 6416  . Highest education level: Not on file  Occupational History  . Not on file  Tobacco Use  . Smoking status: Never Smoker  . Smokeless tobacco: Never Used  Vaping Use  . Vaping Use: Never used  Substance and Sexual Activity  . Alcohol use: Never  . Drug use: Never  . Sexual activity: Not Currently    Birth control/protection: Pill  Other Topics Concern  . Not on file  Social History Narrative   Left  handed   One story home   Drinks caffeine   Social Determinants of Health   Financial Resource Strain:   . Difficulty of Paying Living Expenses:   Food Insecurity:   . Worried About Programme researcher, broadcasting/film/video in the Last Year:   . Barista in the Last Year:   Transportation Needs:   . Freight forwarder (Medical):   Marland Kitchen Lack of Transportation (Non-Medical):   Physical Activity:   . Days of Exercise per Week:   . Minutes of Exercise per Session:   Stress:   . Feeling of Stress :   Social Connections:   . Frequency of Communication with Friends and Family:   . Frequency of Social Gatherings with Friends and Family:   . Attends Religious Services:   . Active Member of Clubs or Organizations:   . Attends Banker Meetings:   Marland Kitchen Marital Status:   Intimate Partner Violence:   . Fear of Current or Ex-Partner:   . Emotionally Abused:   Marland Kitchen Physically Abused:   . Sexually Abused:     Past Medical History, Surgical history, Social history, and Family history were reviewed and updated as appropriate.   Please see review of systems for further details on the patient's review from today.   Objective:   Physical Exam:  There were no vitals taken for this visit.  Physical Exam Constitutional:      General: She is not in acute distress.    Appearance: She is well-developed. She is obese.   Neurological:     Mental Status: She is alert and oriented to person, place, and time.     Cranial Nerves: No dysarthria.  Psychiatric:        Attention and Perception: She is attentive. She does not perceive auditory hallucinations.        Mood and Affect: Mood is anxious. Mood is not depressed. Affect is not labile, blunt, angry or inappropriate.        Speech: Speech normal. Speech is not slurred.        Behavior: Behavior normal. Behavior is not slowed or withdrawn.        Thought Content: Thought content normal. Thought content does not include homicidal or suicidal ideation. Thought content does not include homicidal or suicidal plan.        Cognition and Memory: Cognition normal.        Judgment: Judgment normal.     Comments: Insight intact. No auditory or visual hallucinations. Anxiety a little worse with stress.     Lab Review:     Component Value Date/Time   NA 142 12/26/2019 1440   K 3.5 12/26/2019 1440   CL 103 12/26/2019 1440   CO2 29 12/26/2019 1440   GLUCOSE 110 (H) 12/26/2019 1440   BUN 10 12/26/2019 1440   CREATININE 1.04 (H) 12/26/2019 1440   CALCIUM 8.2 (L) 12/26/2019 1440   PROT 7.4 12/26/2019 1440   ALBUMIN 3.6 12/26/2019 1440   AST 29 12/26/2019 1440   ALT 38 12/26/2019 1440   ALKPHOS 75 12/26/2019 1440   BILITOT 0.2 (L) 12/26/2019 1440   GFRNONAA >60 12/26/2019 1440   GFRAA >60 12/26/2019 1440       Component Value Date/Time   WBC 11.7 (H) 12/26/2019 1440   RBC 4.44 12/26/2019 1440   HGB 13.4 12/26/2019 1440   HCT 41.2 12/26/2019 1440   PLT 350 12/26/2019 1440   MCV 92.8 12/26/2019 1440   MCH 30.2  12/26/2019 1440   MCHC 32.5 12/26/2019 1440   RDW 14.4 12/26/2019 1440   LYMPHSABS 1.9 12/26/2019 1440   MONOABS 0.7 12/26/2019 1440   EOSABS 0.1 12/26/2019 1440   BASOSABS 0.0 12/26/2019 1440    No results found for: POCLITH, LITHIUM   No results found for: PHENYTOIN, PHENOBARB, VALPROATE, CBMZ   .res Assessment: Plan:    Panic disorder  with agoraphobia - Plan: PARoxetine (PAXIL) 30 MG tablet, ALPRAZolam (XANAX) 1 MG tablet  Insomnia due to mental condition - Plan: traZODone (DESYREL) 50 MG tablet  Major depressive disorder with single episode, in full remission (HCC) - Plan: PARoxetine (PAXIL) 30 MG tablet    History of severe panic disorder and mild to moderate major depression which did not respond to sertraline 200 mg a day.  Continue paroxetine to greater than usual dose at 60 to try to limit the need of Bz.  It has worked to manage the anxiety and mood.  Increase in paroxetine was effective at further reducing anxiety below the level she had at 40 mg a day.   She remains easily stressed.  We discussed the short-term risks associated with benzodiazepines including sedation and increased fall risk among others.  Discussed long-term side effect risk including dependence, potential withdrawal symptoms, and the potential eventual dose-related risk of dementia.  Disc risk with opiates.  She is not planning on consistent opiates.  Disc risk of increasing the Xanax.  She's using sparingly.  For sleep continue trazodone 50-100 mg HS.   Continue therapy if helpful.  Follow-up 4-6 mos.  She agrees with the plan. No changes without calling.  Meredith Staggers MD, DFAPA  Please see After Visit Summary for patient specific instructions.  Future Appointments  Date Time Provider Department Center  02/25/2020  8:15 AM Romero Belling, MD [facility] None    No orders of the defined types were placed in this encounter.     -------------------------------

## 2020-02-07 ENCOUNTER — Ambulatory Visit: Payer: 59 | Admitting: Endocrinology

## 2020-02-25 ENCOUNTER — Ambulatory Visit: Payer: 59 | Admitting: Endocrinology

## 2020-03-21 IMAGING — CT CT T SPINE W/O CM
3 of 4 series · 13 of 33 positions shown, 15 images · non-contrast
Comparison: 08/15/2018 CT abdomen and pelvis.

CLINICAL DATA: 37 y/o F; fall at home onto kitchen floor. Back
pain. Denies head injury.

EXAM:
CT CERVICAL, THORACIC, AND LUMBAR SPINE WITHOUT CONTRAST
TECHNIQUE: Multidetector CT imaging of the cervical, thoracic and lumbar spine
was performed without intravenous contrast. Multiplanar CT image
reconstructions were also generated.

[Series 4: t-spine 2.0 st · axial · 0.31mm/px · z∈[-527,-329]mm · 5 of 149 slices shown, 7 images]
[im 25/149  soft-tissue]
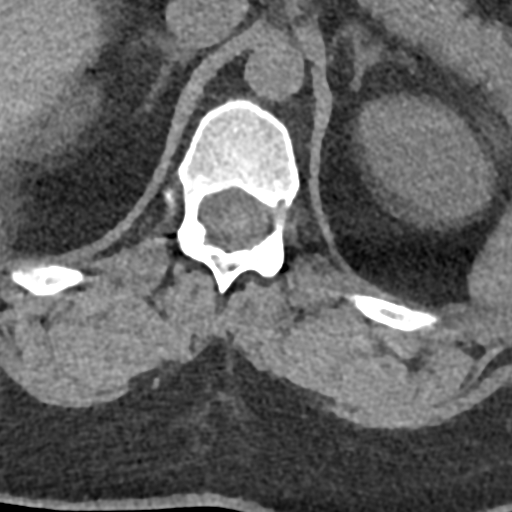
[im 25/149  bone]
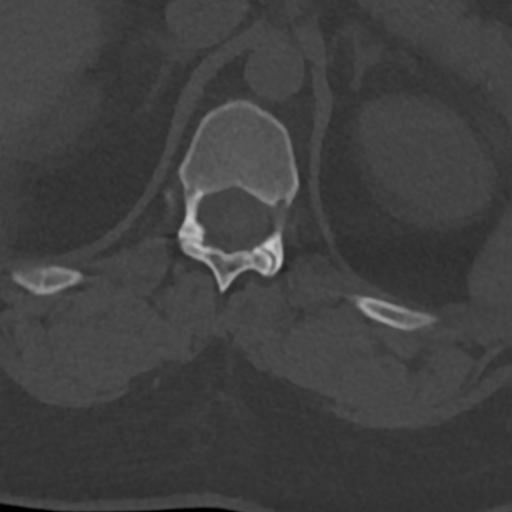
[im 50/149  bone]
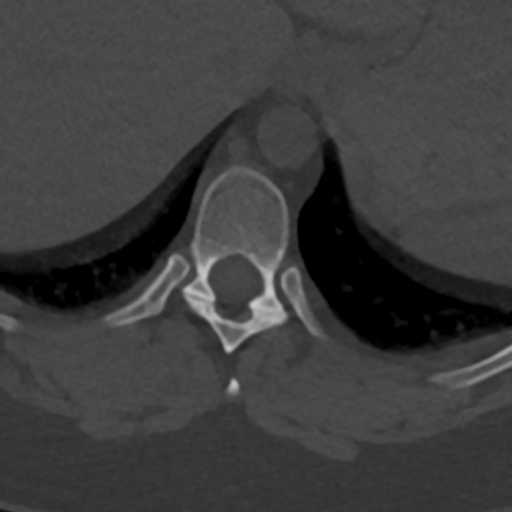
[im 75/149  bone]
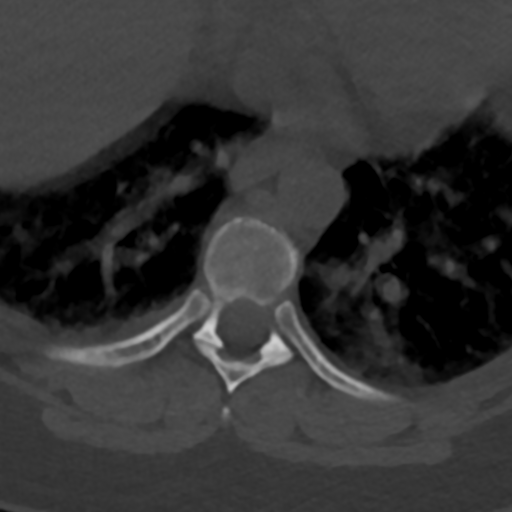
[im 99/149  bone]
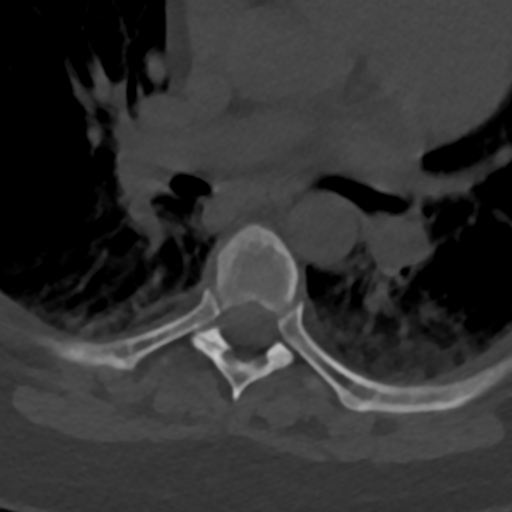
[im 124/149  soft-tissue]
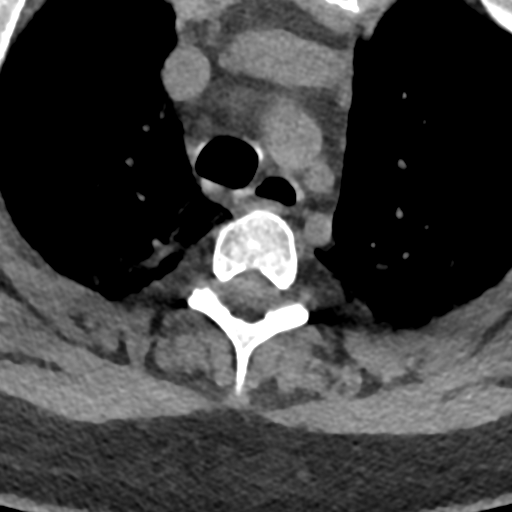
[im 124/149  bone]
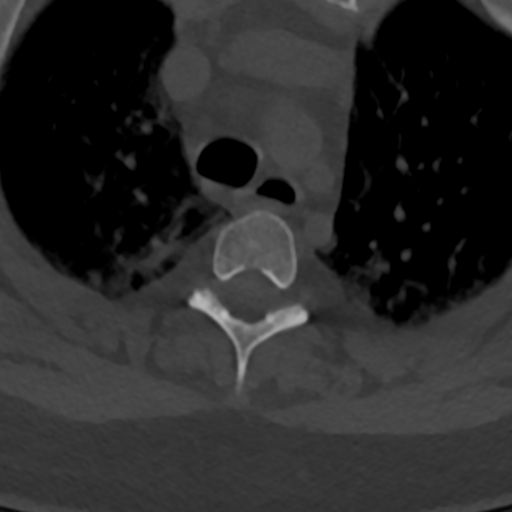

[Series 6: t-spine 2.0 cor bone · coronal · 0.26mm/px · 3 of 61 slices shown]
[im 13/61  bone]
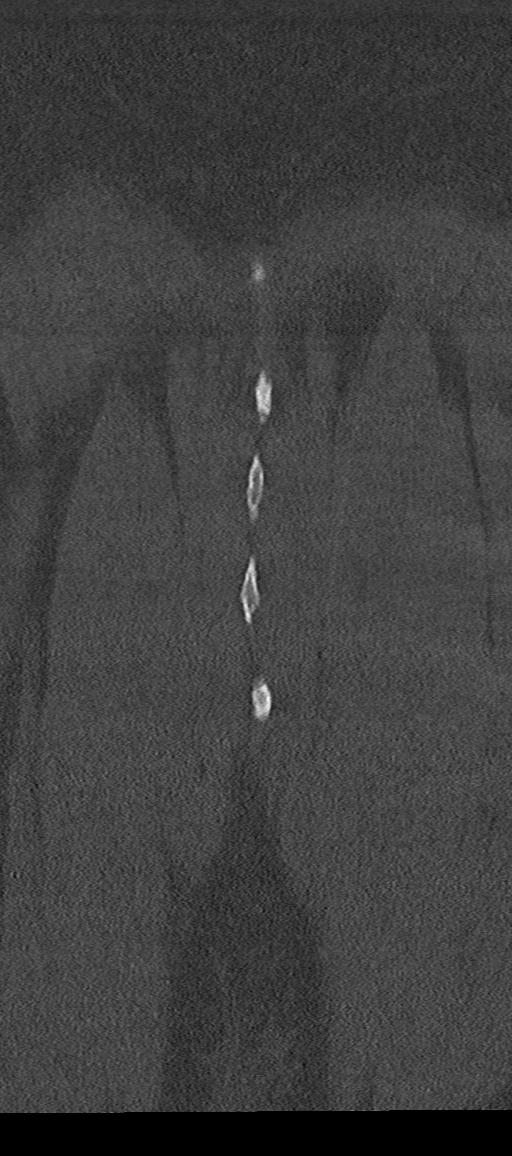
[im 25/61  bone]
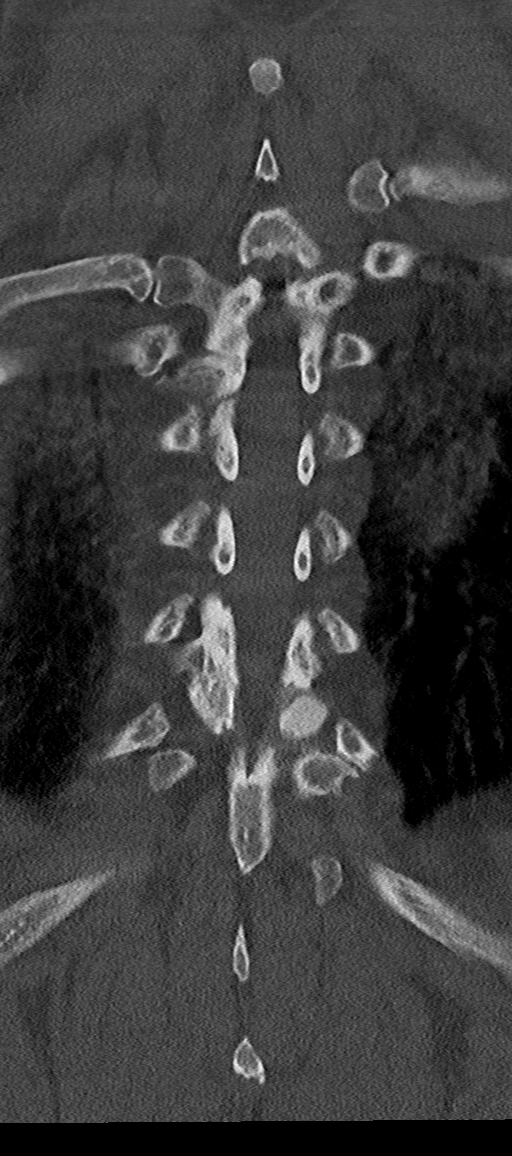
[im 37/61  bone]
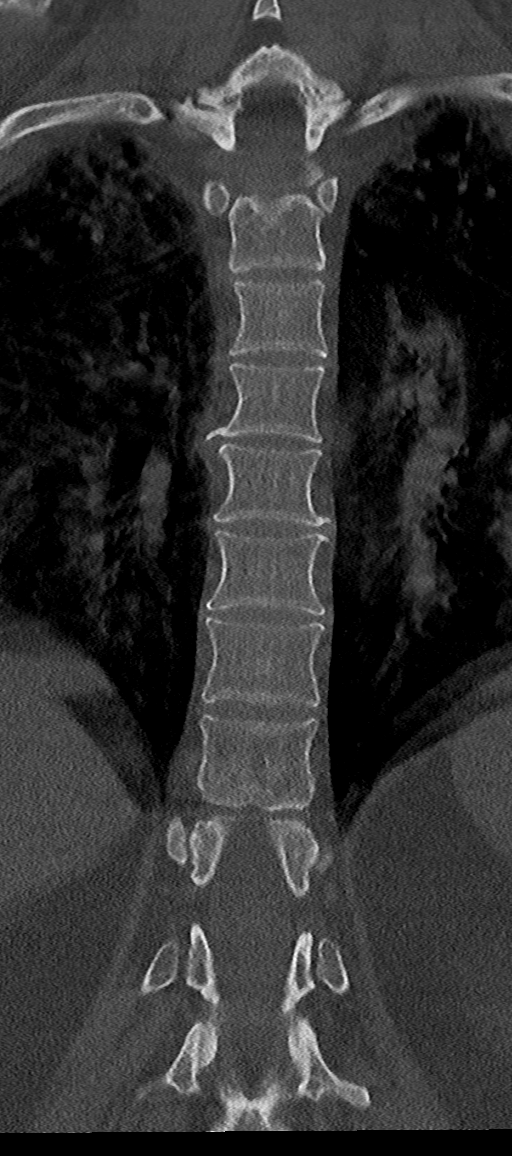

[Series 9: t-spine 2.0 orthogonal · axial · 0.21mm/px · z∈[-519,-334]mm · 5 of 157 slices shown]
[im 27/157  bone]
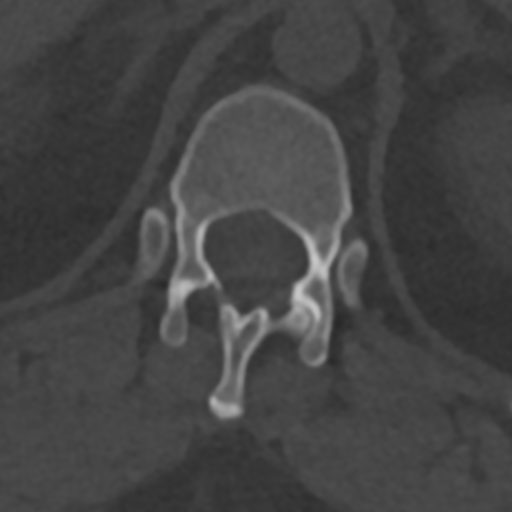
[im 53/157  bone]
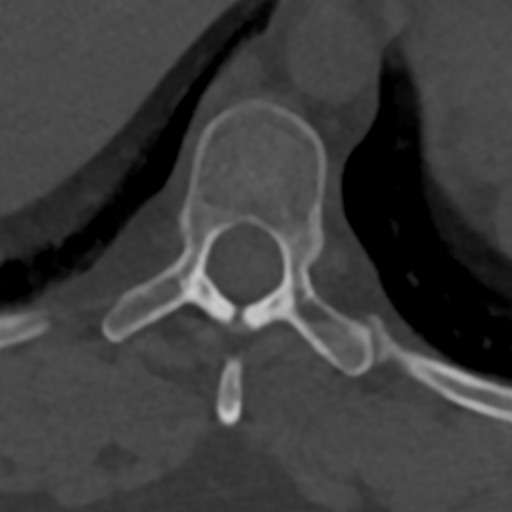
[im 79/157  bone]
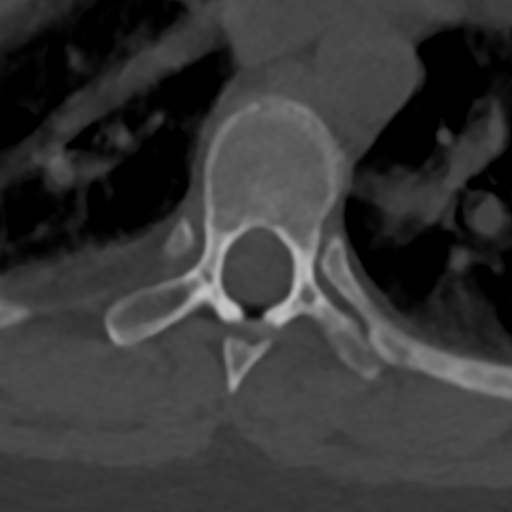
[im 105/157  bone]
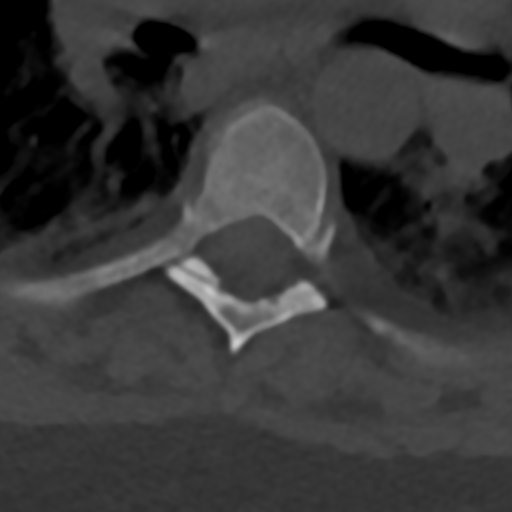
[im 131/157  bone]
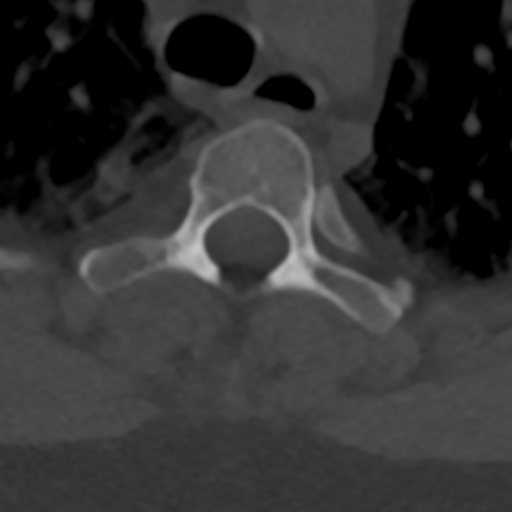

[13 of 33 positions shown; findings below may reference images not displayed]

FINDINGS: CT CERVICAL SPINE FINDINGS

Alignment: Normal.

Skull base and vertebrae: No acute fracture. No primary bone lesion
or focal pathologic process.

Soft tissues and spinal canal: No prevertebral fluid or swelling. No
visible canal hematoma.

Disc levels:  Intervertebral disc space heights are maintained.

Upper chest: Negative.

CT THORACIC SPINE FINDINGS

Alignment: Normal.

Vertebrae: No acute fracture or focal pathologic process.

Paraspinal and other soft tissues: Negative.

Disc levels: Negative.

CT LUMBAR SPINE FINDINGS

Segmentation: 5 lumbar type vertebrae.

Alignment: Stable L5-S1 grade 1 anterolisthesis with chronic
bilateral L5 pars defects.

Vertebrae: No acute fracture or focal pathologic process.

Paraspinal and other soft tissues: Negative.

Disc levels: Severe loss of the L5-S1 intervertebral disc space
height. L5-S1 endplate marginal osteophytes, uncovered disc bulge,
and facet hypertrophy results in severe bilateral foraminal
stenosis.
IMPRESSION: 1. No acute fracture or dislocation identified.
2. Stable grade 1 L5-S1 anterolisthesis with chronic bilateral L5
pars defects.
3. Stable advanced L5-S1 spondylosis resulting in severe bilateral
foraminal stenosis.

## 2020-03-21 IMAGING — CT CT CERVICAL SPINE W/O CM
3 of 4 series · 13 of 33 positions shown, 16 images · non-contrast
Comparison: 08/15/2018 CT abdomen and pelvis.

CLINICAL DATA: 37 y/o F; fall at home onto kitchen floor. Back
pain. Denies head injury.

EXAM:
CT CERVICAL, THORACIC, AND LUMBAR SPINE WITHOUT CONTRAST
TECHNIQUE: Multidetector CT imaging of the cervical, thoracic and lumbar spine
was performed without intravenous contrast. Multiplanar CT image
reconstructions were also generated.

[Series 4: c_spine 2.0 st · axial · 0.34mm/px · z∈[-299,-203]mm · 5 of 73 slices shown, 7 images]
[im 13/73  soft-tissue]
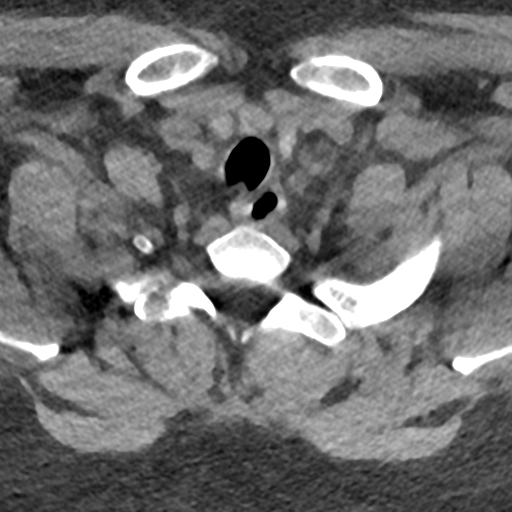
[im 13/73  bone]
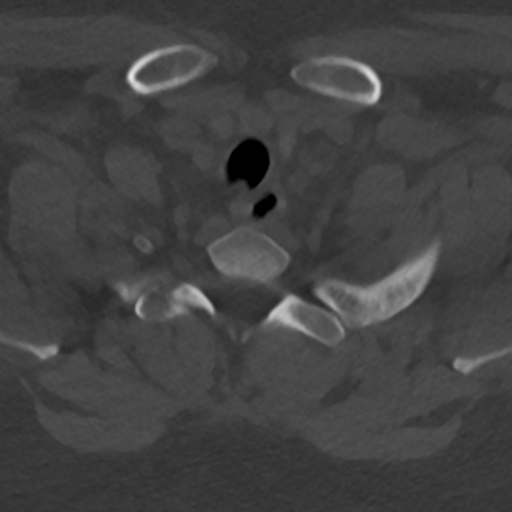
[im 25/73  bone]
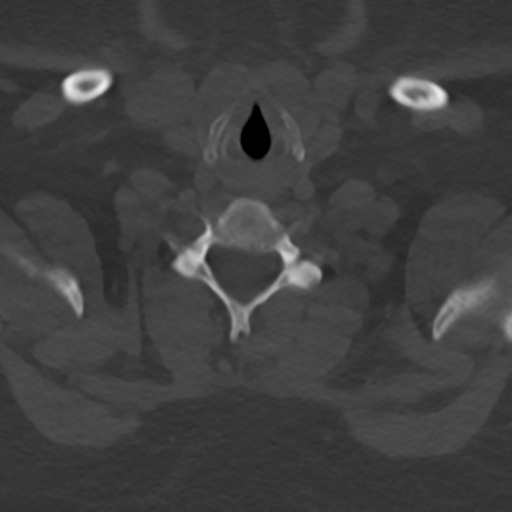
[im 37/73  bone]
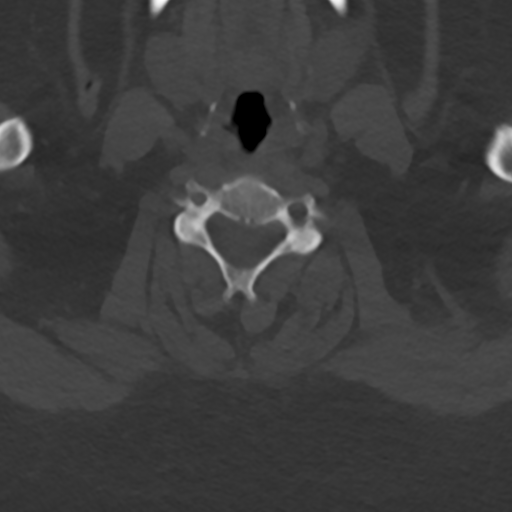
[im 49/73  bone]
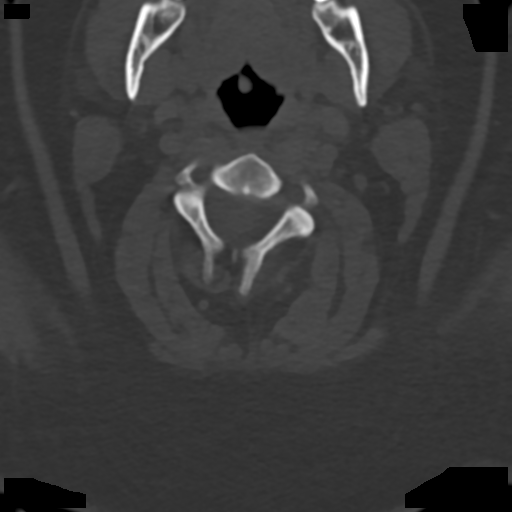
[im 61/73  soft-tissue]
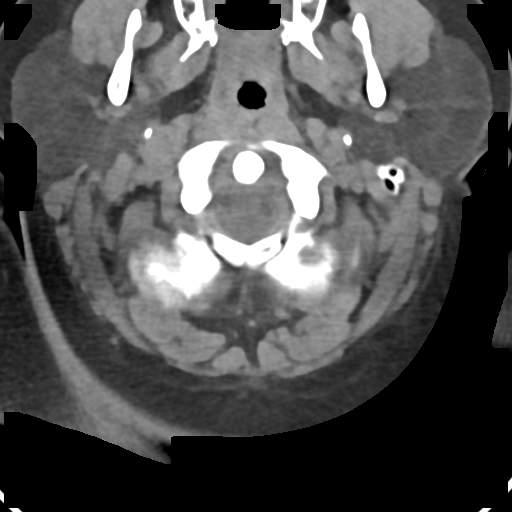
[im 61/73  bone]
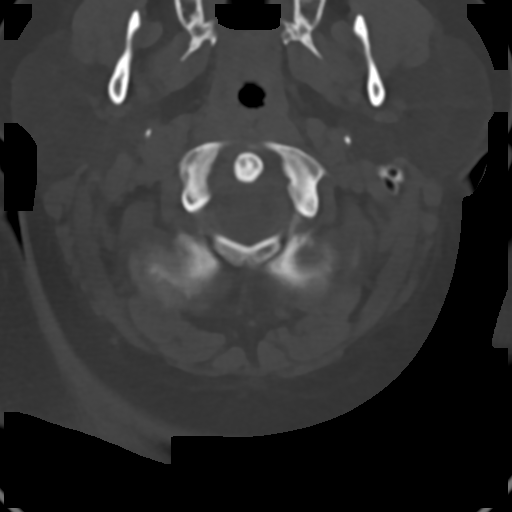

[Series 6: c_spine 2.0 sag bone · sagittal · 0.24mm/px · 5 of 61 slices shown, 6 images]
[im 21/61  bone]
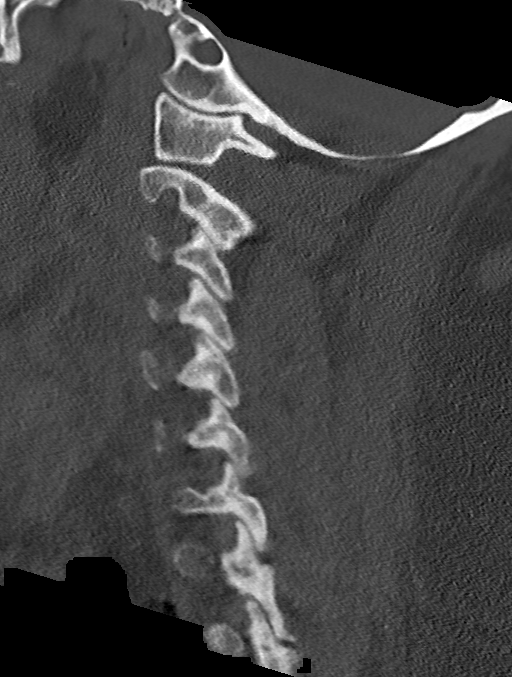
[im 26/61  bone]
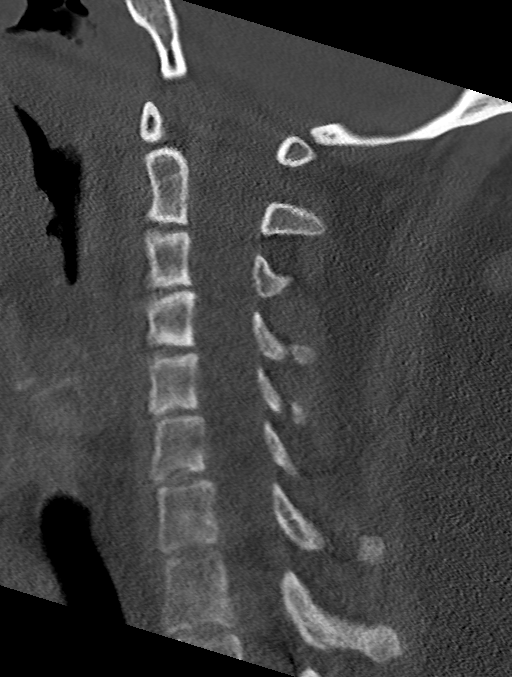
[im 31/61  soft-tissue]
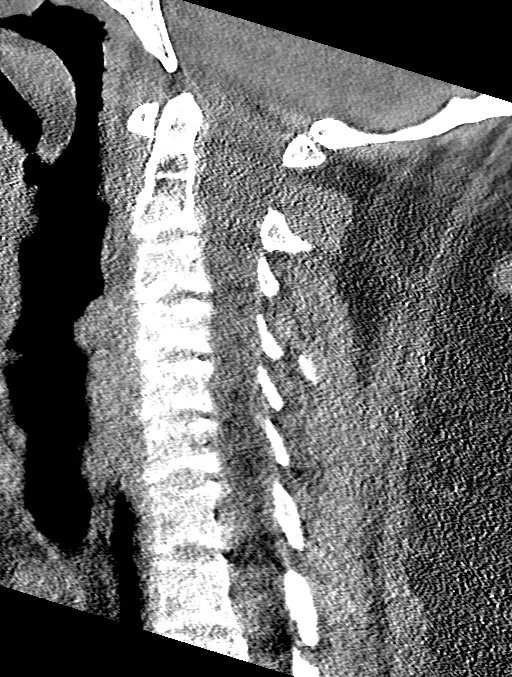
[im 31/61  bone]
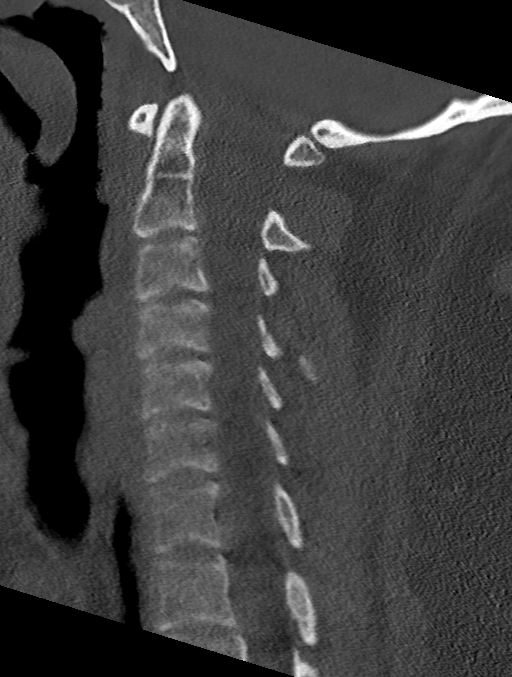
[im 36/61  bone]
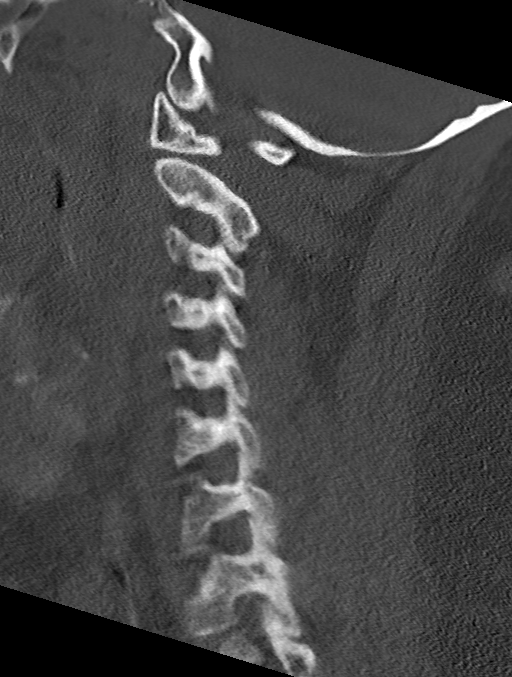
[im 41/61  bone]
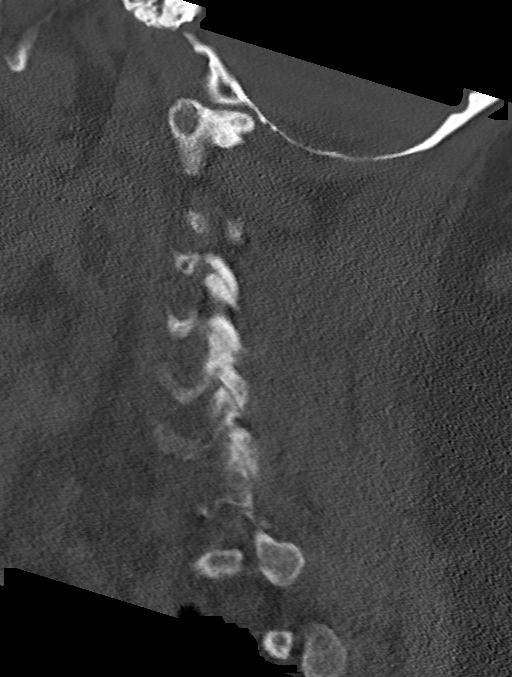

[Series 7: c_spine 2.0 cor bone · coronal · 0.23mm/px · 3 of 61 slices shown]
[im 13/61  bone]
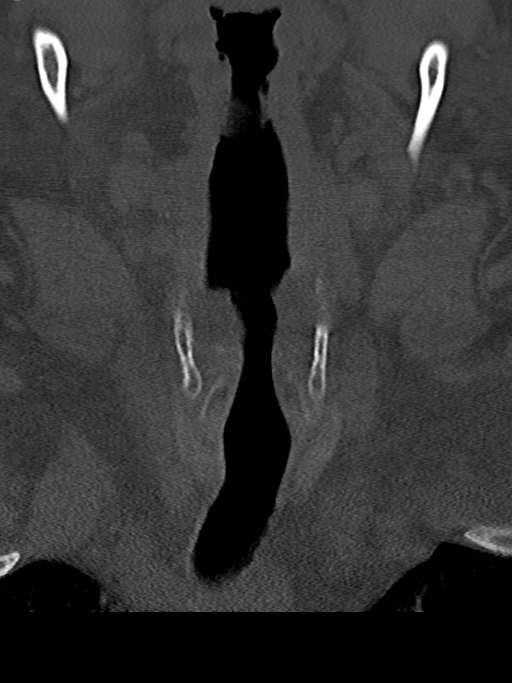
[im 25/61  bone]
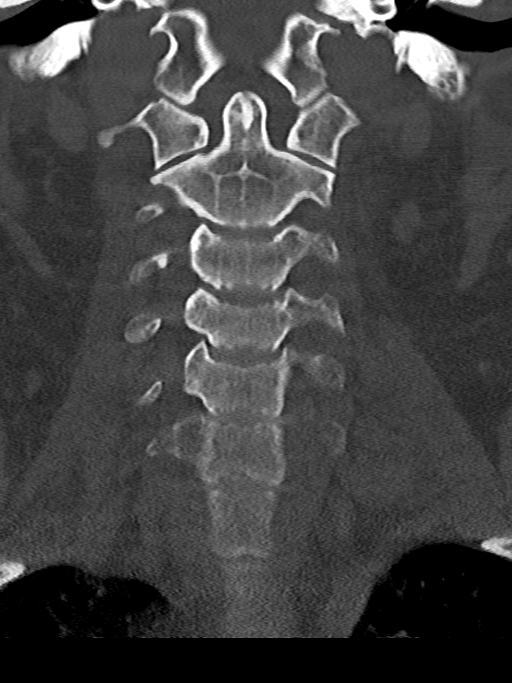
[im 37/61  bone]
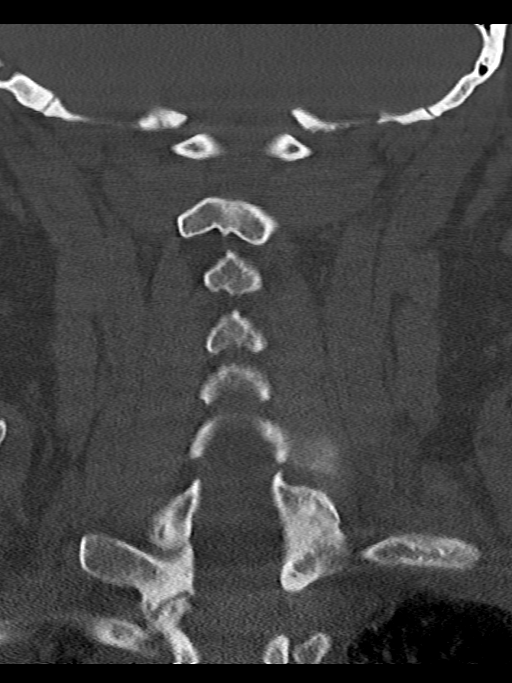

[13 of 33 positions shown; findings below may reference images not displayed]

FINDINGS: CT CERVICAL SPINE FINDINGS

Alignment: Normal.

Skull base and vertebrae: No acute fracture. No primary bone lesion
or focal pathologic process.

Soft tissues and spinal canal: No prevertebral fluid or swelling. No
visible canal hematoma.

Disc levels:  Intervertebral disc space heights are maintained.

Upper chest: Negative.

CT THORACIC SPINE FINDINGS

Alignment: Normal.

Vertebrae: No acute fracture or focal pathologic process.

Paraspinal and other soft tissues: Negative.

Disc levels: Negative.

CT LUMBAR SPINE FINDINGS

Segmentation: 5 lumbar type vertebrae.

Alignment: Stable L5-S1 grade 1 anterolisthesis with chronic
bilateral L5 pars defects.

Vertebrae: No acute fracture or focal pathologic process.

Paraspinal and other soft tissues: Negative.

Disc levels: Severe loss of the L5-S1 intervertebral disc space
height. L5-S1 endplate marginal osteophytes, uncovered disc bulge,
and facet hypertrophy results in severe bilateral foraminal
stenosis.
IMPRESSION: 1. No acute fracture or dislocation identified.
2. Stable grade 1 L5-S1 anterolisthesis with chronic bilateral L5
pars defects.
3. Stable advanced L5-S1 spondylosis resulting in severe bilateral
foraminal stenosis.

## 2020-04-21 ENCOUNTER — Ambulatory Visit: Payer: 59 | Admitting: Endocrinology

## 2020-05-11 ENCOUNTER — Other Ambulatory Visit: Payer: Self-pay | Admitting: Psychiatry

## 2020-05-13 ENCOUNTER — Other Ambulatory Visit: Payer: Self-pay | Admitting: Psychiatry

## 2020-05-13 DIAGNOSIS — F5105 Insomnia due to other mental disorder: Secondary | ICD-10-CM

## 2020-05-14 ENCOUNTER — Other Ambulatory Visit: Payer: Self-pay | Admitting: Psychiatry

## 2020-05-14 ENCOUNTER — Encounter: Payer: Self-pay | Admitting: Endocrinology

## 2020-05-14 ENCOUNTER — Other Ambulatory Visit: Payer: Self-pay

## 2020-05-14 ENCOUNTER — Ambulatory Visit (INDEPENDENT_AMBULATORY_CARE_PROVIDER_SITE_OTHER): Payer: BC Managed Care – PPO | Admitting: Endocrinology

## 2020-05-14 VITALS — BP 128/84 | HR 112 | Ht 65.0 in | Wt 303.0 lb

## 2020-05-14 DIAGNOSIS — F4001 Agoraphobia with panic disorder: Secondary | ICD-10-CM

## 2020-05-14 DIAGNOSIS — E039 Hypothyroidism, unspecified: Secondary | ICD-10-CM

## 2020-05-14 LAB — T3, FREE: T3, Free: 3.4 pg/mL (ref 2.3–4.2)

## 2020-05-14 LAB — CORTISOL
Cortisol, Plasma: 7.9 ug/dL
Cortisol, Plasma: 31.1 ug/dL

## 2020-05-14 LAB — TSH: TSH: 1.49 u[IU]/mL (ref 0.35–4.50)

## 2020-05-14 LAB — T4, FREE: Free T4: 0.78 ng/dL (ref 0.60–1.60)

## 2020-05-14 LAB — VITAMIN D 25 HYDROXY (VIT D DEFICIENCY, FRACTURES): VITD: 39.74 ng/mL (ref 30.00–100.00)

## 2020-05-14 NOTE — Patient Instructions (Signed)
Blood tests are requested for you today.  We'll let you know about the results.  I would be happy to see you back here as needed.   

## 2020-05-14 NOTE — Progress Notes (Signed)
Subjective:    Patient ID: Carolyn Castro, female    DOB: 10-02-1980, 39 y.o.   MRN: [id number]  HPI Pt is referred by Dr Hyman Hopes, to consider endocrine cause of fatigue.  no h/o abdominal or brain injury.  No h/o cancer, seizures, hypoglycemia, amyloidosis, tuberculosis, or diabetes.  she has never been on steroid therapy.  No h/o ketoconazole, rifampin, or dilantin.  Pt also has hypothyroidism.  Pt reports hypothyroidism was dx'ed in 2016.  she has been on prescribed thyroid hormone therapy since dx.  He has never taken kelp or any other type of non-prescribed thyroid product.  He has never had thyroid imaging.  She is not considering a pregnancy.  He has never had thyroid surgery, or XRT to the neck.  He has never been on amiodarone or lithium.  Main symptoms are fatigue, lightheadedness, weight gain, and constipation.   She takes Vit-D, at an uncertain non-rx dosage. Past Medical History:  Diagnosis Date  . Anxiety   . Asthma   . Back pain   . Constipation   . Fatigue   . Hypertension   . Hypothyroid   . Hypothyroidism     Past Surgical History:  Procedure Laterality Date  . BACK SURGERY    . COLONOSCOPY    . ESOPHAGOGASTRODUODENOSCOPY (EGD) WITH PROPOFOL N/A 01/28/2019   Procedure: ESOPHAGOGASTRODUODENOSCOPY (EGD) WITH PROPOFOL;  Surgeon: Carman Ching, MD;  Location: [facility];  Service: Endoscopy;  Laterality: N/A;  . LAPAROSCOPIC OVARIAN CYSTECTOMY Left 06/29/2018   Procedure: LAPAROSCOPIC OVARIAN CYSTECTOMY;  Surgeon: Gerald Leitz, MD;  Location: [facility];  Service: Gynecology;  Laterality: Left;  . TONSILLECTOMY    . WISDOM TOOTH EXTRACTION      Social History   Socioeconomic History  . Marital status: Single    Spouse name: Not on file  . Number of children: 0  . Years of education: 63  . Highest education level: Not on file  Occupational History  . Not on file  Tobacco Use  . Smoking status: Never Smoker  . Smokeless tobacco: Never Used    Vaping Use  . Vaping Use: Never used  Substance and Sexual Activity  . Alcohol use: Never  . Drug use: Never  . Sexual activity: Not Currently    Birth control/protection: Pill  Other Topics Concern  . Not on file  Social History Narrative   Left handed   One story home   Drinks caffeine   Social Determinants of Health   Financial Resource Strain:   . Difficulty of Paying Living Expenses: Not on file  Food Insecurity:   . Worried About Programme researcher, broadcasting/film/video in the Last Year: Not on file  . Ran Out of Food in the Last Year: Not on file  Transportation Needs:   . Lack of Transportation (Medical): Not on file  . Lack of Transportation (Non-Medical): Not on file  Physical Activity:   . Days of Exercise per Week: Not on file  . Minutes of Exercise per Session: Not on file  Stress:   . Feeling of Stress : Not on file  Social Connections:   . Frequency of Communication with Friends and Family: Not on file  . Frequency of Social Gatherings with Friends and Family: Not on file  . Attends Religious Services: Not on file  . Active Member of Clubs or Organizations: Not on file  . Attends Banker Meetings: Not on file  . Marital Status: Not on file  Intimate Partner Violence:   . Fear of Current or Ex-Partner: Not on file  . Emotionally Abused: Not on file  . Physically Abused: Not on file  . Sexually Abused: Not on file    Current Outpatient Medications on File Prior to Visit  Medication Sig Dispense Refill  . ALPRAZolam (XANAX) 1 MG tablet Take 1 tablet (1 mg total) by mouth 2 (two) times daily as needed for anxiety. 30 tablet 2  . amLODipine (NORVASC) 10 MG tablet Take 10 mg by mouth daily.    . budesonide-formoterol (SYMBICORT) 160-4.5 MCG/ACT inhaler Inhale 2 puffs into the lungs 2 (two) times daily.    . cyclobenzaprine (FLEXERIL) 10 MG tablet Take 1 tablet (10 mg total) by mouth 2 (two) times daily as needed for muscle spasms. 10 tablet 0  . fexofenadine  (ALLEGRA) 180 MG tablet Take 180 mg by mouth daily.    Marland Kitchen gabapentin (NEURONTIN) 300 MG capsule Take 300 mg by mouth 3 (three) times daily.    Marland Kitchen levothyroxine (SYNTHROID) 75 MCG tablet Take 75 mcg by mouth daily before breakfast.    . PARoxetine (PAXIL) 30 MG tablet Take 2 tablets (60 mg total) by mouth daily. 180 tablet 1  . traZODone (DESYREL) 50 MG tablet TAKE 2 TABLETS(100 MG) BY MOUTH AT BEDTIME 180 tablet 1  . meloxicam (MOBIC) 15 MG tablet Take 1 tablet (15 mg total) by mouth daily. (Patient not taking: Reported on 05/14/2020) 30 tablet 1   No current facility-administered medications on file prior to visit.    Allergies  Allergen Reactions  . Ciprofloxacin Hcl Diarrhea and Other (See Comments)    Developed C-Diff    Family History  Problem Relation Age of Onset  . Hypertension Mother   . Hypertension Father   . Adrenal disorder Father        uncertain type    BP 128/84   Pulse (!) 112   Ht 5\' 5"  (1.651 m)   Wt (!) 303 lb (137.4 kg)   SpO2 97%   BMI 50.42 kg/m    Review of Systems Denies n/v, cold intolerance, vitiligo, and change in skin tone.      Objective:   Physical Exam VS: see vs page GEN: no distress HEAD: head: no deformity eyes: no periorbital swelling, no proptosis external nose and ears are normal NECK: supple, thyroid is not enlarged CHEST WALL: no deformity LUNGS: clear to auscultation CV: reg rate and rhythm, no murmur.  MUSCULOSKELETAL: gait is normal and steady EXTEMITIES: no deformity.  Trace bilat leg edema NEURO:  cn 2-12 grossly intact.   readily moves all 4's.  sensation is intact to touch on all 4's SKIN:  Normal texture and temperature.  No rash or suspicious lesion is visible.   NODES:  None palpable at the neck PSYCH: alert, well-oriented.  Does not appear anxious nor depressed.   Lab Results  Component Value Date   TSH 4.201 08/27/2019   Lab Results  Component Value Date   CALCIUM 8.2 (L) 12/26/2019   PHOS 3.1 01/03/2019     Lab Results  Component Value Date   HGBA1C 5.6 08/28/2019   Lab Results  Component Value Date   CREATININE 1.04 (H) 12/26/2019   BUN 10 12/26/2019   NA 142 12/26/2019   K 3.5 12/26/2019   CL 103 12/26/2019   CO2 29 12/26/2019   I have reviewed outside records, and summarized: Pt was noted to have above sxs, and referred here.  Sinusitis, myalgias, and  weakness were also addressed.  ACTH stimulation test is done: baseline cortisol level=8 then Cosyntropin 250 mcg is given im 45 minutes later, cortisol level=31 (normal response)  25-OH Vit=39    Assessment & Plan:  Fatigue: no endocrine cause found.  Hypocalcemia, uncertain etiology and prognosis.  Vit-D def: well-replaced. Please continue the same vit-D.   Patient Instructions  Blood tests are requested for you today.  We'll let you know about the results.  I would be happy to see you back here as needed

## 2020-05-15 LAB — PTH, INTACT AND CALCIUM
Calcium: 9.3 mg/dL (ref 8.6–10.2)
PTH: 66 pg/mL — ABNORMAL HIGH (ref 14–64)

## 2020-07-09 ENCOUNTER — Ambulatory Visit: Payer: 59 | Admitting: Psychiatry

## 2020-07-20 ENCOUNTER — Ambulatory Visit: Payer: 59 | Admitting: Psychiatry

## 2020-07-22 ENCOUNTER — Encounter: Payer: Self-pay | Admitting: Psychiatry

## 2020-07-22 ENCOUNTER — Other Ambulatory Visit: Payer: Self-pay

## 2020-07-22 ENCOUNTER — Ambulatory Visit (INDEPENDENT_AMBULATORY_CARE_PROVIDER_SITE_OTHER): Payer: BC Managed Care – PPO | Admitting: Psychiatry

## 2020-07-22 DIAGNOSIS — F325 Major depressive disorder, single episode, in full remission: Secondary | ICD-10-CM | POA: Diagnosis not present

## 2020-07-22 DIAGNOSIS — F5105 Insomnia due to other mental disorder: Secondary | ICD-10-CM

## 2020-07-22 DIAGNOSIS — F4001 Agoraphobia with panic disorder: Secondary | ICD-10-CM

## 2020-07-22 MED ORDER — PAROXETINE HCL 30 MG PO TABS: 60.0000 mg | ORAL_TABLET | Freq: Every day | ORAL | 1 refills | Status: DC

## 2020-07-22 MED ORDER — TRAZODONE HCL 50 MG PO TABS
100.0000 mg | ORAL_TABLET | Freq: Every evening | ORAL | 1 refills | Status: DC | PRN
Start: 2020-07-22 — End: 2020-10-15

## 2020-07-22 MED ORDER — ALPRAZOLAM 1 MG PO TABS: 1.0000 mg | ORAL_TABLET | Freq: Two times a day (BID) | ORAL | 3 refills | Status: DC | PRN

## 2020-07-22 NOTE — Progress Notes (Signed)
[name] [id number] [birth year] 40 y.o.    Subjective:   Patient ID:  Carolyn Castro is a 40 y.o. (DOB 10/24/1980) female.  Chief Complaint:  Chief Complaint  Patient presents with  . Follow-up  . Anxiety  . Sleeping Problem    HPI Carolyn Castro presents to the office today for follow-up of panic disorder and depression.    When seen September 26, 2017 she had previously been switched from sertraline to paroxetine 40 mg for panic attacks.  She had a good response until a recent bout of health problems had noticed a resurgence of panic.  We decided to increase paroxetine to greater than the usual 60 mg daily from 40 mg daily on August 23, 2018.    When seen May 2020.Health gotten better and anxiety has come down.  Increase in paroxetine has helped her also.  No med changes were made.  She called back in June 2020 saying that her anxiety and insomnia was worse and she was having to take steroids for asthma.  She was allowed to increase Xanax to 0.5 mg #5 daily.  Encouraged to reduce the dose as soon as possible.  It was discovered that she was also getting Xanax from another provider and she was cautioned about that as being inappropriate. Subsequently she is gotten Xanax from another provider on more than one occasion. She called April 09, 2019 in the catering her anxiety was worse.  She was worked in to an appointment today.  seen April 12, 2019.  She accidentally reduced the dosage a couple of months prior and  and now the anxiety is out of hand again. Therefore  Increase the paroxetine to greater than usual dose at 60 to try to limit the need of Bz.  Increase in paroxetine was previously effective at the higher dose.[place] PDMP shows she is getting Xanax 1 mg quantity 30 from PCP, Dr. Shirlean Mylar and oxycodone 5 mg tablets and quantities of 40-60 from [facility].   She last filled a Xanax prescription from our office in March 2020.  Says she's stopped pain meds.    Last fill date 8/27. 3 ER visits since she was last here for shortness of breath  Last seen December 2020 & March 2021.  No meds were changed. Increased paroxetine to 60 and it's helped.     02/03/20 appt with the following noted: Consistent with paroxetine 60 since here. Xanax 0.5 mg about twice weekly. Anxiety higher lately.  2 PT jobs including dog sitting.  One of dogs got away and attacked another dog with vet bill.  She had to fork out $1000 to help pay for it.  Realizes she shouldn't have done it.  Got real frutstrated with herself for giving in to their demands. Has done therapy online about twice weekly who is good. Gets numb from stress.  GM died naturally hard.  $ stress.  Louann Sjogren can be demeaning and demanding.  Wants to do international missions but can't DT Covid. Last 2 mos hard bc no recent vacation Dt $ stress as noted with medical bills.  Drained. Sleep well with trazodone. Panic better with paroxetine but not completely stopped but shorter and less severe. Overall really good with panic and using Xanax about 1-2 times weeklly.    Overall anxiety still better with Paxil.  No problems with it.  Likes living at home to get support from parents over her health issues.   Better handling stress and less panic.  Mostly back to normal.  Likes Paxil better than Zoloft.  Not as sig depressive spells either.  Conc and appetite and function is fair.   Early November had pneumonia and otherwise function is good.   Sleeps better with  trazodone.  8-9 hours. Plan continue paroxetine 60 mg daily which was helpful.  No med changes  07/22/2020 appointment with the following noted: Change jobs TA with schools.  At times worries about job performance.  Overall things going well.  Good coworkers.  Since August.  Some worry over saving $ for the summer.  Can get panicky if overthinks that.  Xanax helps.  Anxiety can get triggered at work too excessively.  Some hypervigilance and startle. Hx severe  asthma  Had low back surgery end of June 2020 helped.     Past psych meds:  Zoloft 200 NR & SE, , paxil 60,  Xanax., hydroxyzine, trazodone helped. Hx severe asthma so no propranolol  Review of Systems:  Review of Systems  Constitutional: Positive for unexpected weight change.  Respiratory: Positive for shortness of breath and wheezing. Negative for cough and chest tightness.   Gastrointestinal: Negative for abdominal pain.  Musculoskeletal: Positive for arthralgias and back pain.  Neurological: Negative for dizziness, tremors and weakness.  Psychiatric/Behavioral: Negative for agitation, behavioral problems, confusion, decreased concentration, dysphoric mood, hallucinations, self-injury, sleep disturbance and suicidal ideas. The patient is nervous/anxious. The patient is not hyperactive.   asthma about the same.  Medications: I have reviewed the patient's current medications.  Current Outpatient Medications  Medication Sig Dispense Refill  . amLODipine (NORVASC) 10 MG tablet Take 10 mg by mouth daily.    . budesonide-formoterol (SYMBICORT) 160-4.5 MCG/ACT inhaler Inhale 2 puffs into the lungs 2 (two) times daily.    . cyclobenzaprine (FLEXERIL) 10 MG tablet Take 1 tablet (10 mg total) by mouth 2 (two) times daily as needed for muscle spasms. 10 tablet 0  . fexofenadine (ALLEGRA) 180 MG tablet Take 180 mg by mouth daily.    Marland Kitchen. gabapentin (NEURONTIN) 300 MG capsule Take 300 mg by mouth 3 (three) times daily.    Marland Kitchen. levothyroxine (SYNTHROID) 75 MCG tablet Take 75 mcg by mouth daily before breakfast.    . meloxicam (MOBIC) 15 MG tablet Take 1 tablet (15 mg total) by mouth daily. 30 tablet 1  . ALPRAZolam (XANAX) 1 MG tablet Take 1 tablet (1 mg total) by mouth 2 (two) times daily as needed for anxiety. 30 tablet 3  . PARoxetine (PAXIL) 30 MG tablet Take 2 tablets (60 mg total) by mouth daily. 180 tablet 1  . traZODone (DESYREL) 50 MG tablet Take 2 tablets (100 mg total) by mouth at bedtime  as needed for sleep. 180 tablet 1   No current facility-administered medications for this visit.    Medication Side Effects: None  Allergies:  Allergies  Allergen Reactions  . Ciprofloxacin Hcl Diarrhea and Other (See Comments)    Developed C-Diff    Past Medical History:  Diagnosis Date  . Anxiety   . Asthma   . Back pain   . Constipation   . Fatigue   . Hypertension   . Hypothyroid   . Hypothyroidism     Family History  Problem Relation Age of Onset  . Hypertension Mother   . Hypertension Father   . Adrenal disorder Father        uncertain type    Social History   Socioeconomic History  . Marital status: Single    Spouse  name: Not on file  . Number of children: 0  . Years of education: 11  . Highest education level: Not on file  Occupational History  . Not on file  Tobacco Use  . Smoking status: Never Smoker  . Smokeless tobacco: Never Used  Vaping Use  . Vaping Use: Never used  Substance and Sexual Activity  . Alcohol use: Never  . Drug use: Never  . Sexual activity: Not Currently    Birth control/protection: Pill  Other Topics Concern  . Not on file  Social History Narrative   Left handed   One story home   Drinks caffeine   Social Determinants of Health   Financial Resource Strain: Not on file  Food Insecurity: Not on file  Transportation Needs: Not on file  Physical Activity: Not on file  Stress: Not on file  Social Connections: Not on file  Intimate Partner Violence: Not on file    Past Medical History, Surgical history, Social history, and Family history were reviewed and updated as appropriate.   Please see review of systems for further details on the patient's review from today.   Objective:   Physical Exam:  There were no vitals taken for this visit.  Physical Exam Constitutional:      General: She is not in acute distress.    Appearance: She is well-developed. She is obese.  Neurological:     Mental Status: She is alert  and oriented to person, place, and time.     Cranial Nerves: No dysarthria.  Psychiatric:        Attention and Perception: She is attentive. She does not perceive auditory hallucinations.        Mood and Affect: Mood is anxious. Mood is not depressed. Affect is not labile, blunt, angry or inappropriate.        Speech: Speech normal. Speech is not slurred.        Behavior: Behavior normal. Behavior is not slowed or withdrawn.        Thought Content: Thought content normal. Thought content does not include homicidal or suicidal ideation. Thought content does not include homicidal or suicidal plan.        Cognition and Memory: Cognition normal.        Judgment: Judgment normal.     Comments: Insight intact. No auditory or visual hallucinations. Anxiety a little worse with stress and still worries but better     Lab Review:     Component Value Date/Time   NA 142 12/26/2019 1440   K 3.5 12/26/2019 1440   CL 103 12/26/2019 1440   CO2 29 12/26/2019 1440   GLUCOSE 110 (H) 12/26/2019 1440   BUN 10 12/26/2019 1440   CREATININE 1.04 (H) 12/26/2019 1440   CALCIUM 9.3 05/14/2020 1208   PROT 7.4 12/26/2019 1440   ALBUMIN 3.6 12/26/2019 1440   AST 29 12/26/2019 1440   ALT 38 12/26/2019 1440   ALKPHOS 75 12/26/2019 1440   BILITOT 0.2 (L) 12/26/2019 1440   GFRNONAA >60 12/26/2019 1440   GFRAA >60 12/26/2019 1440       Component Value Date/Time   WBC 11.7 (H) 12/26/2019 1440   RBC 4.44 12/26/2019 1440   HGB 13.4 12/26/2019 1440   HCT 41.2 12/26/2019 1440   PLT 350 12/26/2019 1440   MCV 92.8 12/26/2019 1440   MCH 30.2 12/26/2019 1440   MCHC 32.5 12/26/2019 1440   RDW 14.4 12/26/2019 1440   LYMPHSABS 1.9 12/26/2019 1440   MONOABS 0.7  12/26/2019 1440   EOSABS 0.1 12/26/2019 1440   BASOSABS 0.0 12/26/2019 1440    No results found for: POCLITH, LITHIUM   No results found for: PHENYTOIN, PHENOBARB, VALPROATE, CBMZ   .res Assessment: Plan:    Panic disorder with agoraphobia - Plan:  ALPRAZolam (XANAX) 1 MG tablet, PARoxetine (PAXIL) 30 MG tablet  Insomnia due to mental condition - Plan: traZODone (DESYREL) 50 MG tablet  Major depressive disorder with single episode, in full remission (HCC) - Plan: PARoxetine (PAXIL) 30 MG tablet    History of severe panic disorder and mild to moderate major depression which did not respond to sertraline 200 mg a day.  Continue paroxetine to greater than usual dose at 60 to try to limit the need of Bz.  It has worked to manage the anxiety and mood.  Increase in paroxetine was effective at further reducing anxiety below the level she had at 40 mg a day.   She remains easily stressed.  Betther with job change.  We discussed the short-term risks associated with benzodiazepines including sedation and increased fall risk among others.  Discussed long-term side effect risk including dependence, potential withdrawal symptoms, and the potential eventual dose-related risk of dementia.  Disc risk with opiates.  She is not planning on consistent opiates.  Disc risk of increasing the Xanax.  She's using sparingly.  For sleep continue trazodone 50-100 mg HS. It helps stop worry at night.  Continue therapy if helpful.  Follow-up 4-6 mos.  She agrees with the plan. No changes without calling.  Meredith Staggers MD, DFAPA  Please see After Visit Summary for patient specific instructions.  No future appointments.  No orders of the defined types were placed in this encounter.     -------------------------------

## 2020-10-15 ENCOUNTER — Other Ambulatory Visit: Payer: Self-pay | Admitting: Psychiatry

## 2020-10-15 DIAGNOSIS — F5105 Insomnia due to other mental disorder: Secondary | ICD-10-CM

## 2020-10-15 NOTE — Telephone Encounter (Signed)
Appt on 01/12/21

## 2020-10-28 ENCOUNTER — Emergency Department (HOSPITAL_COMMUNITY): Payer: BC Managed Care – PPO

## 2020-10-28 ENCOUNTER — Other Ambulatory Visit: Payer: Self-pay

## 2020-10-28 ENCOUNTER — Emergency Department (HOSPITAL_COMMUNITY)
Admission: EM | Admit: 2020-10-28 | Discharge: 2020-10-28 | Disposition: A | Discharge status: Home / Self Care | Payer: BC Managed Care – PPO | Attending: Emergency Medicine | Admitting: Emergency Medicine

## 2020-10-28 DIAGNOSIS — I10 Essential (primary) hypertension: Secondary | ICD-10-CM | POA: Diagnosis not present

## 2020-10-28 DIAGNOSIS — R0682 Tachypnea, not elsewhere classified: Secondary | ICD-10-CM | POA: Insufficient documentation

## 2020-10-28 DIAGNOSIS — Z79899 Other long term (current) drug therapy: Secondary | ICD-10-CM | POA: Insufficient documentation

## 2020-10-28 DIAGNOSIS — R0789 Other chest pain: Secondary | ICD-10-CM | POA: Insufficient documentation

## 2020-10-28 DIAGNOSIS — R000 Tachycardia, unspecified: Secondary | ICD-10-CM | POA: Diagnosis not present

## 2020-10-28 DIAGNOSIS — R519 Headache, unspecified: Secondary | ICD-10-CM | POA: Diagnosis not present

## 2020-10-28 DIAGNOSIS — J45909 Unspecified asthma, uncomplicated: Secondary | ICD-10-CM | POA: Insufficient documentation

## 2020-10-28 DIAGNOSIS — Y92410 Unspecified street and highway as the place of occurrence of the external cause: Secondary | ICD-10-CM | POA: Diagnosis not present

## 2020-10-28 DIAGNOSIS — E039 Hypothyroidism, unspecified: Secondary | ICD-10-CM | POA: Diagnosis not present

## 2020-10-28 DIAGNOSIS — Z7951 Long term (current) use of inhaled steroids: Secondary | ICD-10-CM | POA: Insufficient documentation

## 2020-10-28 LAB — CBC WITH DIFFERENTIAL/PLATELET
Abs Immature Granulocytes: 0.05 10*3/uL (ref 0.00–0.07)
Basophils Absolute: 0 10*3/uL (ref 0.0–0.1)
Basophils Relative: 0 %
Eosinophils Absolute: 0.1 10*3/uL (ref 0.0–0.5)
Eosinophils Relative: 1 %
HCT: 43.6 % (ref 36.0–46.0)
Hemoglobin: 14.3 g/dL (ref 12.0–15.0)
Immature Granulocytes: 1 %
Lymphocytes Relative: 16 %
Lymphs Abs: 1.6 10*3/uL (ref 0.7–4.0)
MCH: 29.6 pg (ref 26.0–34.0)
MCHC: 32.8 g/dL (ref 30.0–36.0)
MCV: 90.3 fL (ref 80.0–100.0)
Monocytes Absolute: 0.5 10*3/uL (ref 0.1–1.0)
Monocytes Relative: 6 %
Neutro Abs: 7.5 10*3/uL (ref 1.7–7.7)
Neutrophils Relative %: 76 %
Platelets: 386 10*3/uL (ref 150–400)
RBC: 4.83 MIL/uL (ref 3.87–5.11)
RDW: 13.8 % (ref 11.5–15.5)
WBC: 9.7 10*3/uL (ref 4.0–10.5)
nRBC: 0 % (ref 0.0–0.2)

## 2020-10-28 LAB — COMPREHENSIVE METABOLIC PANEL
ALT: 40 U/L (ref 0–44)
AST: 34 U/L (ref 15–41)
Albumin: 3.3 g/dL — ABNORMAL LOW (ref 3.5–5.0)
Alkaline Phosphatase: 69 U/L (ref 38–126)
Anion gap: 11 (ref 5–15)
BUN: 8 mg/dL (ref 6–20)
CO2: 23 mmol/L (ref 22–32)
Calcium: 8.8 mg/dL — ABNORMAL LOW (ref 8.9–10.3)
Chloride: 103 mmol/L (ref 98–111)
Creatinine, Ser: 0.9 mg/dL (ref 0.44–1.00)
GFR, Estimated: >60 mL/min (ref >60)
Glucose, Bld: 117 mg/dL — ABNORMAL HIGH (ref 70–99)
Potassium: 3.6 mmol/L (ref 3.5–5.1)
Sodium: 137 mmol/L (ref 135–145)
Total Bilirubin: 0.5 mg/dL (ref 0.3–1.2)
Total Protein: 7 g/dL (ref 6.5–8.1)

## 2020-10-28 LAB — ETHANOL: Alcohol, Ethyl (B): <10 mg/dL (ref <10)

## 2020-10-28 LAB — I-STAT BETA HCG BLOOD, ED (MC, WL, AP ONLY): I-stat hCG, quantitative: <5 m[IU]/mL (ref <5)

## 2020-10-28 LAB — D-DIMER, QUANTITATIVE: D-Dimer, Quant: 0.9 ug/mL-FEU — ABNORMAL HIGH (ref 0.00–0.50)

## 2020-10-28 MED ORDER — METHYL SALICYLATE-LIDO-MENTHOL 4-4-5 % EX PTCH: 1.0000 | MEDICATED_PATCH | Freq: Two times a day (BID) | CUTANEOUS | 0 refills | Status: DC

## 2020-10-28 MED ORDER — MORPHINE SULFATE (PF) 4 MG/ML IV SOLN
4.0000 mg | Freq: Once | INTRAVENOUS | Status: AC
  Administered 2020-10-28: 4 mg via INTRAVENOUS
  Filled 2020-10-28: qty 1

## 2020-10-28 MED ORDER — SODIUM CHLORIDE 0.9 % IV BOLUS
500.0000 mL | Freq: Once | INTRAVENOUS | Status: AC
  Administered 2020-10-28: 500 mL via INTRAVENOUS

## 2020-10-28 MED ORDER — DICLOFENAC EPOLAMINE 1.3 % EX PTCH
1.0000 | MEDICATED_PATCH | Freq: Two times a day (BID) | CUTANEOUS | Status: DC
  Administered 2020-10-28: 1 via TRANSDERMAL
  Filled 2020-10-28 (×2): qty 1

## 2020-10-28 MED ORDER — IOHEXOL 350 MG/ML SOLN
100.0000 mL | Freq: Once | INTRAVENOUS | Status: AC | PRN
  Administered 2020-10-28: 100 mL via INTRAVENOUS
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
## 2020-10-28 NOTE — Discharge Instructions (Signed)
As discussed, today's evaluation has been generally reassuring.  However, with your ongoing chest pain and today's ED ECG showing a borderline prolonged QT, it is importantly follow-up with our cardiologists in addition to your psychiatrist.  When you call the cardiology office to schedule follow-up, please note that this is a recommendation of the ED physician due to your chest pain and borderline prolonged QT.   For the next few days you will likely have persistent soreness, but if you develop worsening symptoms, or any concerning changes, do not hesitate to return here.

## 2020-10-28 NOTE — ED Notes (Signed)
Patient transported to CT 
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
## 2020-11-03 ENCOUNTER — Telehealth: Payer: Self-pay | Admitting: Emergency Medicine

## 2020-11-03 NOTE — Telephone Encounter (Signed)
ATC. Needing to set pt up with consult with Dr. Delton Coombes. If pt calls back please use any open space (30 min consult) to schedule pt with Dr.Byrum

## 2020-11-04 ENCOUNTER — Other Ambulatory Visit: Payer: Self-pay

## 2020-11-04 ENCOUNTER — Ambulatory Visit (INDEPENDENT_AMBULATORY_CARE_PROVIDER_SITE_OTHER): Payer: BC Managed Care – PPO | Admitting: Emergency Medicine

## 2020-11-04 ENCOUNTER — Encounter: Payer: Self-pay | Admitting: Emergency Medicine

## 2020-11-04 VITALS — BP 148/88 | HR 115 | Temp 98.1°F | Ht 64.0 in | Wt 316.2 lb

## 2020-11-04 DIAGNOSIS — G4734 Idiopathic sleep related nonobstructive alveolar hypoventilation: Secondary | ICD-10-CM | POA: Diagnosis not present

## 2020-11-04 DIAGNOSIS — R55 Syncope and collapse: Secondary | ICD-10-CM | POA: Diagnosis not present

## 2020-11-04 DIAGNOSIS — R0683 Snoring: Secondary | ICD-10-CM

## 2020-11-04 DIAGNOSIS — J4541 Moderate persistent asthma with (acute) exacerbation: Secondary | ICD-10-CM

## 2020-11-04 DIAGNOSIS — J9611 Chronic respiratory failure with hypoxia: Secondary | ICD-10-CM
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
## 2020-11-04 NOTE — Assessment & Plan Note (Signed)
Please continue Symbicort 2 puffs twice a day.  Rinse and gargle after using. Keep your albuterol available to use 2 puffs when you need if shortness of breath, chest tightness, wheezing.

## 2020-11-04 NOTE — Assessment & Plan Note (Signed)
Reliable with her supplemental oxygen at night 5 L/min.  Question whether this is adequate.  She has gained 30 pounds since her PSG and needs a repeat.  She may now have clinically relevant OSA/OHS.  We will start with the sleep study.  If she does not have clinically significant OSA then consider an a.m. ABG to look for hypercapnia

## 2020-11-04 NOTE — Patient Instructions (Addendum)
We will arrange for split-night sleep study Walking oximetry on room air today For now continue to use your oxygen at 5 L/min at night while sleeping Depending on your work-up we may decide to refer you to neurology to rule out other causes for your altered level of consciousness, fogginess Please continue Symbicort 2 puffs twice a day.  Rinse and gargle after using. Keep your albuterol available to use 2 puffs when you need if shortness of breath, chest tightness, wheezing. Follow with Dr. Delton Coombes in 2 months or sooner if you have any problems.
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
## 2020-11-04 NOTE — Assessment & Plan Note (Signed)
We will arrange for a walking oximetry, evaluate her nocturnal oximetry and for OSA as above

## 2020-11-09 ENCOUNTER — Telehealth: Payer: Self-pay | Admitting: Emergency Medicine

## 2020-11-09 NOTE — Telephone Encounter (Signed)
PCC"S   Did one of yall call her about the split night study that was scheduled?   thanks

## 2020-11-10 NOTE — Telephone Encounter (Signed)
Spoke with patient & gave her appt info.  Nothing further needed at this time.

## 2020-11-10 NOTE — Telephone Encounter (Signed)
I didn't call her .

## 2020-11-13 ENCOUNTER — Other Ambulatory Visit: Payer: Self-pay | Admitting: Psychiatry

## 2020-11-13 DIAGNOSIS — F4001 Agoraphobia with panic disorder: Secondary | ICD-10-CM

## 2021-01-12 ENCOUNTER — Ambulatory Visit (INDEPENDENT_AMBULATORY_CARE_PROVIDER_SITE_OTHER): Payer: BC Managed Care – PPO | Admitting: Psychiatry

## 2021-01-12 ENCOUNTER — Encounter: Payer: Self-pay | Admitting: Psychiatry

## 2021-01-12 ENCOUNTER — Ambulatory Visit (HOSPITAL_BASED_OUTPATIENT_CLINIC_OR_DEPARTMENT_OTHER): Payer: BC Managed Care – PPO | Attending: Emergency Medicine | Admitting: Pulmonary Disease

## 2021-01-12 ENCOUNTER — Other Ambulatory Visit: Payer: Self-pay

## 2021-01-12 DIAGNOSIS — R0683 Snoring: Secondary | ICD-10-CM

## 2021-01-12 DIAGNOSIS — R0902 Hypoxemia: Secondary | ICD-10-CM | POA: Insufficient documentation

## 2021-01-12 DIAGNOSIS — F5105 Insomnia due to other mental disorder: Secondary | ICD-10-CM | POA: Diagnosis not present

## 2021-01-12 DIAGNOSIS — F4001 Agoraphobia with panic disorder: Secondary | ICD-10-CM | POA: Diagnosis not present

## 2021-01-12 DIAGNOSIS — F325 Major depressive disorder, single episode, in full remission: Secondary | ICD-10-CM

## 2021-01-12 DIAGNOSIS — G4733 Obstructive sleep apnea (adult) (pediatric): Secondary | ICD-10-CM | POA: Diagnosis not present

## 2021-01-12 DIAGNOSIS — G4736 Sleep related hypoventilation in conditions classified elsewhere: Secondary | ICD-10-CM | POA: Diagnosis not present

## 2021-01-12 DIAGNOSIS — G4763 Sleep related bruxism: Secondary | ICD-10-CM | POA: Diagnosis not present

## 2021-01-12 MED ORDER — PAROXETINE HCL 30 MG PO TABS: 60.0000 mg | ORAL_TABLET | Freq: Every day | ORAL | 1 refills | Status: DC

## 2021-01-12 MED ORDER — TRAZODONE HCL 50 MG PO TABS
100.0000 mg | ORAL_TABLET | Freq: Every day | ORAL | 1 refills | Status: DC
Start: 2021-01-12 — End: 2021-04-19
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
## 2021-01-14 ENCOUNTER — Ambulatory Visit: Payer: BC Managed Care – PPO | Admitting: Emergency Medicine

## 2021-01-14 ENCOUNTER — Encounter: Payer: Self-pay | Admitting: Emergency Medicine

## 2021-01-14 ENCOUNTER — Other Ambulatory Visit: Payer: Self-pay

## 2021-01-14 DIAGNOSIS — J9611 Chronic respiratory failure with hypoxia: Secondary | ICD-10-CM

## 2021-01-14 DIAGNOSIS — J4541 Moderate persistent asthma with (acute) exacerbation: Secondary | ICD-10-CM | POA: Diagnosis not present

## 2021-01-14 DIAGNOSIS — J301 Allergic rhinitis due to pollen: Secondary | ICD-10-CM | POA: Diagnosis not present

## 2021-01-14 DIAGNOSIS — R404 Transient alteration of awareness: Secondary | ICD-10-CM | POA: Diagnosis not present

## 2021-01-14 DIAGNOSIS — G4733 Obstructive sleep apnea (adult) (pediatric): Secondary | ICD-10-CM | POA: Diagnosis not present

## 2021-01-14 NOTE — Patient Instructions (Addendum)
We will get the final report on your split-night sleep study from 01/12/2021.  On my initial interpretation it does not look like there is any significant obstructive sleep apnea that would indicate that we need to treat with CPAP. He did continue to have low oxygen levels when you sleep and should continue your oxygen at night as you have been using it. Please continue your Symbicort 2 puffs twice a day.  Rinse and gargle after using. Keep your albuterol available to use 2 puffs if needed for shortness of breath, chest tightness, wheezing. Continue Singulair and Allegra as you have been taking them. We will refer you to Neurology to further evaluate your symptoms of fogginess, clouded thinking. Follow with Dr Delton Coombes in 6 months or sooner if you have any problems
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
## 2021-01-14 NOTE — Addendum Note (Signed)
Addended by: Dorisann Frames R on: 01/14/2021 10:02 AM   Modules accepted: Orders

## 2021-01-14 NOTE — Procedures (Signed)
Patient Name: Carolyn Castro, Carolyn Castro Date: 01/12/2021 Gender: Female D.O.B: January 21, 1981 Age (years): 39 Referring Provider: Levy Pupa Height (inches): 64 Interpreting Physician: Cyril Mourning MD, ABSM Weight (lbs): 300 RPSGT: Maynard Sink BMI: 51 MRN: [id number] Neck Size: 17.00 <br> <br> CLINICAL INFORMATION Sleep Study Type: NPSG    Indication for sleep study: Fatigue, Hypertension, Morning Headaches, Obesity, Snoring    Epworth Sleepiness Score: 9     Most recent polysomnogram dated 01/08/2019 revealed an AHI of 4.7/h. SLEEP STUDY TECHNIQUE As per the AASM Manual for the Scoring of Sleep and Associated Events v2.3 (April 2016) with a hypopnea requiring 4% desaturations.  The channels recorded and monitored were frontal, central and occipital EEG, electrooculogram (EOG), submentalis EMG (chin), nasal and oral airflow, thoracic and abdominal wall motion, anterior tibialis EMG, snore microphone, electrocardiogram, and pulse oximetry.  MEDICATIONS Medications self-administered by patient taken the night of the study : TRAZODONE, PAROXETINE, GABAPENTIN, PRILOSEC, CRANBERRY, SINGULAIR  SLEEP ARCHITECTURE The study was initiated at 9:47:09 PM and ended at 4:58:16 AM.  Sleep onset time was 6.9 minutes and the sleep efficiency was 94.6%%. The total sleep time was 408 minutes.  Stage REM latency was 340.5 minutes.  The patient spent 5.4%% of the night in stage N1 sleep, 82.5%% in stage N2 sleep, 0.0%% in stage N3 and 12.1% in REM.  Alpha intrusion was absent.  Supine sleep was 80.02%.  RESPIRATORY PARAMETERS The overall apnea/hypopnea index (AHI) was 8.4 per hour. There were 0 total apneas, including 0 obstructive, 0 central and 0 mixed apneas. There were 57 hypopneas and 20 RERAs.  The AHI during Stage REM sleep was 44.8 per hour.  AHI while supine was 2.0 per hour.  The mean oxygen saturation was 92.1%. The minimum SpO2 during sleep was 83.0%.  moderate snoring  was noted during this study.  CARDIAC DATA The 2 lead EKG demonstrated sinus rhythm. The mean heart rate was 100.3 beats per minute. Other EKG findings include: None.  LEG MOVEMENT DATA The total PLMS were 0 with a resulting PLMS index of 0.0. Associated arousal with leg movement index was 0.0 .  IMPRESSIONS - Mild obstructive sleep apnea occurred during this study (AHI = 8.4/h).Most events were noted during REM sleep - Mild oxygen desaturation was noted during this study (Min O2 = 83.0%). - The patient snored with moderate snoring volume. - No cardiac abnormalities were noted during this study. - Clinically significant periodic limb movements did not occur during sleep. No significant associated arousals.  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 - Sleep hygiene should be reviewed to assess factors that may improve sleep quality. - Weight management and regular exercise should be initiated or continued if appropriate.  Cyril Mourning MD Board Certified in Sleep medicine

## 2021-01-14 NOTE — Assessment & Plan Note (Addendum)
She initially described this as almost presyncope, feeling like she is going to pass out.  But she more reliably describes it now as "cloudy thinking, slow responsiveness".  Her PSG looks to be reassuring although she does need to continue her oxygen at night.  We will check walking oximetry today to ensure that she does not have occult desaturation.  She has several medications on her list that could cause clouded thinking including Xanax, trazodone, Paxil, muscle relaxer and these may be contributors.  I think she needs neurology consultation to ensure there is no other work-up needs to be done to evaluate further, question partial seizures.  She agrees and I will make the referral today.

## 2021-01-14 NOTE — Assessment & Plan Note (Signed)
Walking oximetry today.  Continue oxygen at 5 L/min while sleeping.

## 2021-01-14 NOTE — Assessment & Plan Note (Signed)
Continue Symbicort 2 puffs twice a day.  Albuterol as needed.  Adequate control at this time.

## 2021-01-14 NOTE — Assessment & Plan Note (Signed)
Singulair, Allegra.  Improved compared with last visit

## 2021-01-18 ENCOUNTER — Encounter: Payer: Self-pay | Admitting: Neurology

## 2021-01-19 ENCOUNTER — Ambulatory Visit: Payer: BC Managed Care – PPO | Admitting: Psychiatry

## 2021-02-01 ENCOUNTER — Other Ambulatory Visit: Payer: Self-pay | Admitting: Psychiatry

## 2021-02-01 DIAGNOSIS — F4001 Agoraphobia with panic disorder: Secondary | ICD-10-CM

## 2021-02-02 NOTE — Telephone Encounter (Signed)
Please send if appropriate.

## 2021-03-22 ENCOUNTER — Ambulatory Visit: Payer: BC Managed Care – PPO | Admitting: Neurology

## 2021-04-05 ENCOUNTER — Other Ambulatory Visit: Payer: Self-pay | Admitting: Psychiatry

## 2021-04-05 DIAGNOSIS — F4001 Agoraphobia with panic disorder: Secondary | ICD-10-CM

## 2021-04-19 ENCOUNTER — Ambulatory Visit (INDEPENDENT_AMBULATORY_CARE_PROVIDER_SITE_OTHER): Payer: BC Managed Care – PPO | Admitting: Psychiatry

## 2021-04-19 ENCOUNTER — Other Ambulatory Visit: Payer: Self-pay

## 2021-04-19 ENCOUNTER — Encounter: Payer: Self-pay | Admitting: Psychiatry

## 2021-04-19 DIAGNOSIS — F5105 Insomnia due to other mental disorder: Secondary | ICD-10-CM

## 2021-04-19 DIAGNOSIS — F4001 Agoraphobia with panic disorder: Secondary | ICD-10-CM | POA: Diagnosis not present

## 2021-04-19 DIAGNOSIS — F325 Major depressive disorder, single episode, in full remission: Secondary | ICD-10-CM

## 2021-04-19 MED ORDER — PAROXETINE HCL 30 MG PO TABS: 60.0000 mg | ORAL_TABLET | Freq: Every day | ORAL | 1 refills | Status: DC

## 2021-04-19 MED ORDER — TRAZODONE HCL 50 MG PO TABS: 100.0000 mg | ORAL_TABLET | Freq: Every day | ORAL | 1 refills | Status: DC

## 2021-04-19 MED ORDER — ALPRAZOLAM 1 MG PO TABS
1.0000 mg | ORAL_TABLET | Freq: Two times a day (BID) | ORAL | 3 refills | Status: DC | PRN
Start: 2021-04-19 — End: 2021-08-23

## 2021-04-19 NOTE — Progress Notes (Signed)
Carolyn Castro [id number] 09-21-1980 40 y.o.    Subjective:   Patient ID:  Carolyn Castro is a 40 y.o. (DOB 02-02-1981) female.  Chief Complaint:  Chief Complaint  Patient presents with   Follow-up   Panic disorder with agoraphobia   Anxiety   Stress    HPI Carolyn Castro presents to the office today for follow-up of panic disorder and depression.    When seen September 26, 2017 she had previously been switched from sertraline to paroxetine 40 mg for panic attacks.  She had a good response until a recent bout of health problems had noticed a resurgence of panic.  We decided to increase paroxetine to greater than the usual 60 mg daily from 40 mg daily on August 23, 2018.    When seen May 2020.Health gotten better and anxiety has come down.  Increase in paroxetine has helped her also.  No med changes were made.  She called back in June 2020 saying that her anxiety and insomnia was worse and she was having to take steroids for asthma.  She was allowed to increase Xanax to 0.5 mg #5 daily.  Encouraged to reduce the dose as soon as possible.  It was discovered that she was also getting Xanax from another provider and she was cautioned about that as being inappropriate. Subsequently she is gotten Xanax from another provider on more than one occasion. She called April 09, 2019 in the catering her anxiety was worse.  She was worked in to an appointment today.  seen April 12, 2019.  She accidentally reduced the dosage a couple of months prior and  and now the anxiety is out of hand again. Therefore  Increase the paroxetine to greater than usual dose at 60 to try to limit the need of Bz.  Increase in paroxetine was previously effective at the higher dose.[place] PDMP shows she is getting Xanax 1 mg quantity 30 from PCP, Dr. Shirlean Mylar and oxycodone 5 mg tablets and quantities of 40-60 from [facility].   She last filled a Xanax prescription from our office in March 2020.  Says she's  stopped pain meds.   Last fill date 8/27. 3 ER visits since she was last here for shortness of breath  seen December 2020 & March 2021.  No meds were changed. Increased paroxetine to 60 and it's helped.     02/03/20 appt with the following noted: Consistent with paroxetine 60 since here. Xanax 0.5 mg about twice weekly. Anxiety higher lately.  2 PT jobs including dog sitting.  One of dogs got away and attacked another dog with vet bill.  She had to fork out $1000 to help pay for it.  Realizes she shouldn't have done it.  Got real frutstrated with herself for giving in to their demands. Has done therapy online about twice weekly who is good. Gets numb from stress.  GM died naturally hard.  $ stress.  Louann Sjogren can be demeaning and demanding.  Wants to do international missions but can't DT Covid. Last 2 mos hard bc no recent vacation Dt $ stress as noted with medical bills.  Drained. Sleep well with trazodone. Panic better with paroxetine but not completely stopped but shorter and less severe. Overall really good with panic and using Xanax about 1-2 times weeklly.    Overall anxiety still better with Paxil.  No problems with it.  Likes living at home to get support from parents over her health issues.   Better handling  stress and less panic.  Mostly back to normal.  Likes Paxil better than Zoloft.  Not as sig depressive spells either.  Conc and appetite and function is fair.   Early November had pneumonia and otherwise function is good.   Sleeps better with  trazodone.  8-9 hours. Plan continue paroxetine 60 mg daily which was helpful.  No med changes  07/22/2020 appointment with the following noted: Change jobs TA with schools.  At times worries about job performance.  Overall things going well.  Good coworkers.  Since August.  Some worry over saving $ for the summer.  Can get panicky if overthinks that.  Xanax helps.  Anxiety can get triggered at work too excessively.  Some hypervigilance and  startle. Hx severe asthma Plan: continue paroxetine 60, Xanax 1 mg ana d trazodone 100  01/12/2021 appointment with the following noted: Parents got Covid a few weeks ago and recovered except some fatigue. She has not had it. Anxiety comes and goes. Started counseling [facility] to deal with stressors and and anxiety and it's helped. Less need for Xanax.. Trazodone works without hangover.   In a month half of the days are anxious. Can see people that are stressful and trigger anxiety.  Not much avoidance with counseling help. Fell asleep driving recently and cog problems and pending sleep study in the lab toninght.  Not using CPAP but using o2. No med changes    04/19/21 appt noted: A lot going on.  Labor day Covid with sig sx.  Having to use more Xanax; about 1 daily 3 days per week.  It messed with her mind bc it drug on and started getting paranoid about her health. GF MI at church. Had a sent. Dad felll a few days later with arm fx and pending surgery.  Hyped up about him.  Worries over dad's health and super close to him. Able to sleep ok.  Work is fine with long hours. No SE.  Had low back surgery end of June 2020 helped.     Past psych meds:  Zoloft 200 NR & SE, , paxil 60,  Xanax., hydroxyzine, trazodone 100 helped. Hx severe asthma so no propranolol  Review of Systems:  Review of Systems  Constitutional:  Positive for unexpected weight change.  Respiratory:  Positive for shortness of breath and wheezing. Negative for cough and chest tightness.   Gastrointestinal:  Negative for abdominal pain.  Musculoskeletal:  Positive for arthralgias and back pain.  Neurological:  Negative for dizziness and tremors.  Psychiatric/Behavioral:  Negative for agitation, behavioral problems, confusion, decreased concentration, hallucinations, self-injury, sleep disturbance and suicidal ideas. The patient is nervous/anxious. The patient is not hyperactive.  asthma about the  same.  Medications: I have reviewed the patient's current medications.  Current Outpatient Medications  Medication Sig Dispense Refill   amLODipine (NORVASC) 10 MG tablet Take 10 mg by mouth daily.     budesonide-formoterol (SYMBICORT) 160-4.5 MCG/ACT inhaler Inhale 2 puffs into the lungs 2 (two) times daily.     cyclobenzaprine (FLEXERIL) 10 MG tablet Take 1 tablet (10 mg total) by mouth 2 (two) times daily as needed for muscle spasms. 10 tablet 0   fexofenadine (ALLEGRA) 180 MG tablet Take 180 mg by mouth daily.     gabapentin (NEURONTIN) 300 MG capsule Take 300 mg by mouth 3 (three) times daily.     levothyroxine (SYNTHROID) 75 MCG tablet Take 75 mcg by mouth daily before breakfast.     meloxicam (MOBIC) 15  MG tablet Take 1 tablet (15 mg total) by mouth daily. (Patient taking differently: Take 15 mg by mouth as needed.) 30 tablet 1   montelukast (SINGULAIR) 10 MG tablet Take 10 mg by mouth at bedtime.     ondansetron (ZOFRAN) 4 MG tablet Take 4 mg by mouth every 8 (eight) hours as needed for nausea or vomiting.     ALPRAZolam (XANAX) 1 MG tablet Take 1 tablet (1 mg total) by mouth 2 (two) times daily as needed for anxiety. 30 tablet 3   PARoxetine (PAXIL) 30 MG tablet Take 2 tablets (60 mg total) by mouth daily. 180 tablet 1   traZODone (DESYREL) 50 MG tablet Take 2 tablets (100 mg total) by mouth at bedtime. 180 tablet 1   No current facility-administered medications for this visit.    Medication Side Effects: None  Allergies:  Allergies  Allergen Reactions   Ciprofloxacin Hcl Diarrhea and Other (See Comments)    Developed C-Diff    Past Medical History:  Diagnosis Date   Anxiety    Asthma    Back pain    Constipation    Fatigue    Hypertension    Hypothyroid    Hypothyroidism     Family History  Problem Relation Age of Onset   Hypertension Mother    Hypertension Father    Adrenal disorder Father        uncertain type    Social History   Socioeconomic History    Marital status: Single    Spouse name: Not on file   Number of children: 0   Years of education: 16   Highest education level: Not on file  Occupational History   Not on file  Tobacco Use   Smoking status: Never   Smokeless tobacco: Never  Vaping Use   Vaping Use: Never used  Substance and Sexual Activity   Alcohol use: Never   Drug use: Never   Sexual activity: Not Currently    Birth control/protection: Pill  Other Topics Concern   Not on file  Social History Narrative   Left handed   One story home   Drinks caffeine   Social Determinants of Health   Financial Resource Strain: Not on file  Food Insecurity: Not on file  Transportation Needs: Not on file  Physical Activity: Not on file  Stress: Not on file  Social Connections: Not on file  Intimate Partner Violence: Not on file    Past Medical History, Surgical history, Social history, and Family history were reviewed and updated as appropriate.   Please see review of systems for further details on the patient's review from today.   Objective:   Physical Exam:  There were no vitals taken for this visit.  Physical Exam Constitutional:      General: She is not in acute distress.    Appearance: She is obese.  Neurological:     Mental Status: She is alert and oriented to person, place, and time.  Psychiatric:        Attention and Perception: She is attentive. She does not perceive auditory hallucinations.        Mood and Affect: Mood is anxious. Mood is not depressed. Affect is not labile, blunt, angry or inappropriate.        Speech: Speech normal. Speech is not slurred.        Behavior: Behavior normal. Behavior is not slowed or withdrawn.        Thought Content: Thought content normal. Thought  content does not include homicidal or suicidal ideation. Thought content does not include homicidal or suicidal plan.        Cognition and Memory: Cognition normal.        Judgment: Judgment normal.     Comments:  Insight intact. Not depressed No auditory or visual hallucinations. Anxiety a little worse with stress and still worries . 1/2 days are pretty anxious. Talkative.    Lab Review:     Component Value Date/Time   NA 137 10/28/2020 0936   K 3.6 10/28/2020 0936   CL 103 10/28/2020 0936   CO2 23 10/28/2020 0936   GLUCOSE 117 (H) 10/28/2020 0936   BUN 8 10/28/2020 0936   CREATININE 0.90 10/28/2020 0936   CALCIUM 8.8 (L) 10/28/2020 0936   PROT 7.0 10/28/2020 0936   ALBUMIN 3.3 (L) 10/28/2020 0936   AST 34 10/28/2020 0936   ALT 40 10/28/2020 0936   ALKPHOS 69 10/28/2020 0936   BILITOT 0.5 10/28/2020 0936   GFRNONAA >60 10/28/2020 0936   GFRAA >60 12/26/2019 1440       Component Value Date/Time   WBC 9.7 10/28/2020 0936   RBC 4.83 10/28/2020 0936   HGB 14.3 10/28/2020 0936   HCT 43.6 10/28/2020 0936   PLT 386 10/28/2020 0936   MCV 90.3 10/28/2020 0936   MCH 29.6 10/28/2020 0936   MCHC 32.8 10/28/2020 0936   RDW 13.8 10/28/2020 0936   LYMPHSABS 1.6 10/28/2020 0936   MONOABS 0.5 10/28/2020 0936   EOSABS 0.1 10/28/2020 0936   BASOSABS 0.0 10/28/2020 0936    No results found for: POCLITH, LITHIUM   No results found for: PHENYTOIN, PHENOBARB, VALPROATE, CBMZ   .res Assessment: Plan:    Panic disorder with agoraphobia - Plan: ALPRAZolam (XANAX) 1 MG tablet, PARoxetine (PAXIL) 30 MG tablet  Major depressive disorder with single episode, in full remission (HCC) - Plan: PARoxetine (PAXIL) 30 MG tablet  Insomnia due to mental condition - Plan: traZODone (DESYREL) 50 MG tablet   Greater than 50% of 30 min face to face time with patient was spent on counseling and coordination of care. We discussed History of severe panic disorder and mild to moderate major depression which did not respond to sertraline 200 mg a day.  Continue paroxetine to greater than usual dose at 60 to try to limit the need of Bz.  It has helped to partially manage the anxiety and mood.  Increase in paroxetine  was effective at further reducing anxiety below the level she had at 40 mg a day but 1/2 days are still anxious days.   Residual anxiety.  Consider even higher dose for TR anxiety.  Disc SE   She remains easily stressed.  Betther with job change.  sleep study pending showed low O2 at night and on 02 now..  No OSA .Marland Kitchen  Disc importance of oxygen levels to brain and mental health.  We discussed the short-term risks associated with benzodiazepines including sedation and increased fall risk among others.  Discussed long-term side effect risk including dependence, potential withdrawal symptoms, and the potential eventual dose-related risk of dementia.  Disc risk with opiates.  She is not planning on consistent opiates.  Disc risk of increasing the Xanax.  She's using sparingly. Continue Xanax 1 mg BID prn  For sleep continue trazodone 50-100 mg HS. It helps stop worry at night.  Continue therapy if helpful. Going twice monthly  No med changes  Follow-up 4 mos.  She agrees with the plan.  No changes without calling.  Consider increase paroxetine.  Meredith Staggers MD, DFAPA  Please see After Visit Summary for patient specific instructions.  Future Appointments  Date Time Provider Department Center  05/21/2021  7:50 AM Allena Katz, Roxana Hires K, DO [facility] None    No orders of the defined types were placed in this encounter.     -------------------------------

## 2021-05-10 ENCOUNTER — Other Ambulatory Visit: Payer: Self-pay | Admitting: Psychiatry

## 2021-05-10 DIAGNOSIS — F325 Major depressive disorder, single episode, in full remission: Secondary | ICD-10-CM

## 2021-05-10 DIAGNOSIS — F4001 Agoraphobia with panic disorder: Secondary | ICD-10-CM

## 2021-05-21 ENCOUNTER — Ambulatory Visit: Payer: BC Managed Care – PPO | Admitting: Neurology

## 2021-07-28 ENCOUNTER — Inpatient Hospital Stay (HOSPITAL_COMMUNITY)
Admission: EM | Admit: 2021-07-28 | Discharge: 2021-07-31 | DRG: 193 | Disposition: A | Discharge status: Home / Self Care | Payer: BC Managed Care – PPO | Attending: Internal Medicine | Admitting: Internal Medicine

## 2021-07-28 ENCOUNTER — Emergency Department (HOSPITAL_COMMUNITY): Payer: BC Managed Care – PPO

## 2021-07-28 ENCOUNTER — Other Ambulatory Visit: Payer: Self-pay

## 2021-07-28 DIAGNOSIS — J4541 Moderate persistent asthma with (acute) exacerbation: Secondary | ICD-10-CM | POA: Diagnosis present

## 2021-07-28 DIAGNOSIS — Z8249 Family history of ischemic heart disease and other diseases of the circulatory system: Secondary | ICD-10-CM

## 2021-07-28 DIAGNOSIS — R0602 Shortness of breath: Secondary | ICD-10-CM

## 2021-07-28 DIAGNOSIS — I160 Hypertensive urgency: Secondary | ICD-10-CM | POA: Diagnosis present

## 2021-07-28 DIAGNOSIS — E039 Hypothyroidism, unspecified: Secondary | ICD-10-CM | POA: Diagnosis present

## 2021-07-28 DIAGNOSIS — J9621 Acute and chronic respiratory failure with hypoxia: Secondary | ICD-10-CM | POA: Diagnosis present

## 2021-07-28 DIAGNOSIS — Z7951 Long term (current) use of inhaled steroids: Secondary | ICD-10-CM

## 2021-07-28 DIAGNOSIS — Z79899 Other long term (current) drug therapy: Secondary | ICD-10-CM

## 2021-07-28 DIAGNOSIS — Z6841 Body Mass Index (BMI) 40.0 and over, adult: Secondary | ICD-10-CM

## 2021-07-28 DIAGNOSIS — Z79890 Hormone replacement therapy (postmenopausal): Secondary | ICD-10-CM

## 2021-07-28 DIAGNOSIS — Z881 Allergy status to other antibiotic agents status: Secondary | ICD-10-CM

## 2021-07-28 DIAGNOSIS — E669 Obesity, unspecified: Secondary | ICD-10-CM | POA: Diagnosis present

## 2021-07-28 DIAGNOSIS — E871 Hypo-osmolality and hyponatremia: Secondary | ICD-10-CM | POA: Diagnosis present

## 2021-07-28 DIAGNOSIS — J129 Viral pneumonia, unspecified: Principal | ICD-10-CM | POA: Diagnosis present

## 2021-07-28 DIAGNOSIS — F419 Anxiety disorder, unspecified: Secondary | ICD-10-CM | POA: Diagnosis present

## 2021-07-28 DIAGNOSIS — K219 Gastro-esophageal reflux disease without esophagitis: Secondary | ICD-10-CM | POA: Diagnosis present

## 2021-07-28 DIAGNOSIS — R079 Chest pain, unspecified: Secondary | ICD-10-CM

## 2021-07-28 DIAGNOSIS — J45901 Unspecified asthma with (acute) exacerbation: Secondary | ICD-10-CM | POA: Diagnosis present

## 2021-07-28 DIAGNOSIS — J189 Pneumonia, unspecified organism: Secondary | ICD-10-CM | POA: Diagnosis present

## 2021-07-28 DIAGNOSIS — J9611 Chronic respiratory failure with hypoxia: Secondary | ICD-10-CM | POA: Diagnosis present

## 2021-07-28 DIAGNOSIS — I10 Essential (primary) hypertension: Secondary | ICD-10-CM | POA: Diagnosis present

## 2021-07-28 DIAGNOSIS — Z20822 Contact with and (suspected) exposure to covid-19: Secondary | ICD-10-CM | POA: Diagnosis present

## 2021-07-28 DIAGNOSIS — D649 Anemia, unspecified: Secondary | ICD-10-CM | POA: Diagnosis present

## 2021-07-28 DIAGNOSIS — Z888 Allergy status to other drugs, medicaments and biological substances status: Secondary | ICD-10-CM

## 2021-07-28 LAB — BASIC METABOLIC PANEL
Anion gap: 13 (ref 5–15)
BUN: 8 mg/dL (ref 6–20)
CO2: 19 mmol/L — ABNORMAL LOW (ref 22–32)
Calcium: 9 mg/dL (ref 8.9–10.3)
Chloride: 101 mmol/L (ref 98–111)
Creatinine, Ser: 0.93 mg/dL (ref 0.44–1.00)
GFR, Estimated: >60 mL/min (ref >60)
Glucose, Bld: 112 mg/dL — ABNORMAL HIGH (ref 70–99)
Potassium: 3.5 mmol/L (ref 3.5–5.1)
Sodium: 133 mmol/L — ABNORMAL LOW (ref 135–145)

## 2021-07-28 LAB — CBC
HCT: 43.7 % (ref 36.0–46.0)
Hemoglobin: 14.8 g/dL (ref 12.0–15.0)
MCH: 29.7 pg (ref 26.0–34.0)
MCHC: 33.9 g/dL (ref 30.0–36.0)
MCV: 87.8 fL (ref 80.0–100.0)
Platelets: 396 10*3/uL (ref 150–400)
RBC: 4.98 MIL/uL (ref 3.87–5.11)
RDW: 13.4 % (ref 11.5–15.5)
WBC: 12.4 10*3/uL — ABNORMAL HIGH (ref 4.0–10.5)
nRBC: 0 % (ref 0.0–0.2)

## 2021-07-28 LAB — TROPONIN I (HIGH SENSITIVITY)
Troponin I (High Sensitivity): 6 ng/L (ref <18)
Troponin I (High Sensitivity): 4 ng/L (ref <18)

## 2021-07-28 NOTE — ED Triage Notes (Signed)
Pt here from home via [facility] for shob. Hx of asthma, having flare ups over the last few days, using inhaler w no relief. Pt has dyspnea w/ exertion and chest tightness w/ shob. Hx HTN, 110HR, 96% RA, 172/90

## 2021-07-28 NOTE — ED Provider Triage Note (Signed)
Emergency Medicine Provider Triage Evaluation Note  Carolyn Castro , a 41 y.o. female  was evaluated in triage.  Pt complains of chest pain for the last 1 month.  Patient reports she has been seen by her PCP for this issue, given 2 rounds of antibiotics and steroids with no relief.  Patient has history of asthma, hypothyroid, hypertension, anxiety.  Patient states that her "lungs hurt" and she is having a sinus headache along with congestion.  Patient endorses worsening of chest pain with exertion.  Patient states the chest pain is located diffusely across her chest but more so on the right side, describes it as a constant pain. Patient describes it as cramping.  Review of Systems  Positive: Chest pain, shortness of breath, headache, congestion, nausea Negative: Vomiting, fevers, abdominal pain, diarrhea  Physical Exam  BP (!) 181/114 (BP Location: Right Arm)    Pulse (!) 130    Temp 98.5 F (36.9 C) (Oral)    Resp (!) 22    SpO2 98%  Gen:   Awake, no distress   Resp:  Normal effort  MSK:   Moves extremities without difficulty  Other:  Lungs clear to auscultation bilaterally.  No lower extremity swelling.  No JVD.  Medical Decision Making  Medically screening exam initiated at 1:48 PM.  Appropriate orders placed.  TULANI KOUBA was informed that the remainder of the evaluation will be completed by another provider, this initial triage assessment does not replace that evaluation, and the importance of remaining in the ED until their evaluation is complete.     Azucena Cecil, PA-C 07/28/21 1350

## 2021-07-29 ENCOUNTER — Emergency Department (HOSPITAL_COMMUNITY): Payer: BC Managed Care – PPO

## 2021-07-29 ENCOUNTER — Encounter (HOSPITAL_COMMUNITY): Payer: Self-pay | Admitting: Student

## 2021-07-29 DIAGNOSIS — D649 Anemia, unspecified: Secondary | ICD-10-CM | POA: Diagnosis present

## 2021-07-29 DIAGNOSIS — K219 Gastro-esophageal reflux disease without esophagitis: Secondary | ICD-10-CM

## 2021-07-29 DIAGNOSIS — R0602 Shortness of breath: Secondary | ICD-10-CM | POA: Diagnosis present

## 2021-07-29 DIAGNOSIS — J4541 Moderate persistent asthma with (acute) exacerbation: Secondary | ICD-10-CM | POA: Diagnosis present

## 2021-07-29 DIAGNOSIS — I160 Hypertensive urgency: Secondary | ICD-10-CM | POA: Diagnosis present

## 2021-07-29 DIAGNOSIS — Z6841 Body Mass Index (BMI) 40.0 and over, adult: Secondary | ICD-10-CM | POA: Diagnosis not present

## 2021-07-29 DIAGNOSIS — E039 Hypothyroidism, unspecified: Secondary | ICD-10-CM

## 2021-07-29 DIAGNOSIS — Z888 Allergy status to other drugs, medicaments and biological substances status: Secondary | ICD-10-CM | POA: Diagnosis not present

## 2021-07-29 DIAGNOSIS — Z79899 Other long term (current) drug therapy: Secondary | ICD-10-CM | POA: Diagnosis not present

## 2021-07-29 DIAGNOSIS — J45901 Unspecified asthma with (acute) exacerbation: Secondary | ICD-10-CM | POA: Diagnosis present

## 2021-07-29 DIAGNOSIS — Z20822 Contact with and (suspected) exposure to covid-19: Secondary | ICD-10-CM | POA: Diagnosis present

## 2021-07-29 DIAGNOSIS — J129 Viral pneumonia, unspecified: Secondary | ICD-10-CM | POA: Diagnosis present

## 2021-07-29 DIAGNOSIS — J189 Pneumonia, unspecified organism: Secondary | ICD-10-CM | POA: Diagnosis not present

## 2021-07-29 DIAGNOSIS — E871 Hypo-osmolality and hyponatremia: Secondary | ICD-10-CM | POA: Diagnosis present

## 2021-07-29 DIAGNOSIS — J9611 Chronic respiratory failure with hypoxia: Secondary | ICD-10-CM | POA: Diagnosis not present

## 2021-07-29 DIAGNOSIS — J9621 Acute and chronic respiratory failure with hypoxia: Secondary | ICD-10-CM | POA: Diagnosis present

## 2021-07-29 DIAGNOSIS — F419 Anxiety disorder, unspecified: Secondary | ICD-10-CM

## 2021-07-29 DIAGNOSIS — Z79890 Hormone replacement therapy (postmenopausal): Secondary | ICD-10-CM | POA: Diagnosis not present

## 2021-07-29 DIAGNOSIS — Z881 Allergy status to other antibiotic agents status: Secondary | ICD-10-CM | POA: Diagnosis not present

## 2021-07-29 DIAGNOSIS — Z8249 Family history of ischemic heart disease and other diseases of the circulatory system: Secondary | ICD-10-CM | POA: Diagnosis not present

## 2021-07-29 DIAGNOSIS — Z7951 Long term (current) use of inhaled steroids: Secondary | ICD-10-CM | POA: Diagnosis not present

## 2021-07-29 DIAGNOSIS — I10 Essential (primary) hypertension: Secondary | ICD-10-CM | POA: Diagnosis present

## 2021-07-29 LAB — I-STAT BETA HCG BLOOD, ED (MC, WL, AP ONLY): I-stat hCG, quantitative: <5 m[IU]/mL (ref <5)

## 2021-07-29 LAB — PHOSPHORUS: Phosphorus: 1.9 mg/dL — ABNORMAL LOW (ref 2.5–4.6)

## 2021-07-29 LAB — BASIC METABOLIC PANEL
Anion gap: 11 (ref 5–15)
BUN: 9 mg/dL (ref 6–20)
CO2: 21 mmol/L — ABNORMAL LOW (ref 22–32)
Calcium: 8.5 mg/dL — ABNORMAL LOW (ref 8.9–10.3)
Chloride: 103 mmol/L (ref 98–111)
Creatinine, Ser: 0.96 mg/dL (ref 0.44–1.00)
GFR, Estimated: >60 mL/min (ref >60)
Glucose, Bld: 117 mg/dL — ABNORMAL HIGH (ref 70–99)
Potassium: 3.5 mmol/L (ref 3.5–5.1)
Sodium: 135 mmol/L (ref 135–145)

## 2021-07-29 LAB — TSH: TSH: 3.708 u[IU]/mL (ref 0.350–4.500)

## 2021-07-29 LAB — RESP PANEL BY RT-PCR (FLU A&B, COVID) ARPGX2
Influenza A by PCR: NEGATIVE
Influenza B by PCR: NEGATIVE
SARS Coronavirus 2 by RT PCR: NEGATIVE

## 2021-07-29 LAB — PROCALCITONIN: Procalcitonin: <0.1 ng/mL

## 2021-07-29 MED ORDER — CYCLOBENZAPRINE HCL 10 MG PO TABS
10.0000 mg | ORAL_TABLET | Freq: Two times a day (BID) | ORAL | Status: DC | PRN
  Administered 2021-07-29: 10 mg via ORAL
  Filled 2021-07-29: qty 1

## 2021-07-29 MED ORDER — SODIUM CHLORIDE 0.9% FLUSH
3.0000 mL | Freq: Two times a day (BID) | INTRAVENOUS | Status: DC
  Administered 2021-07-29 – 2021-07-31 (×4): 3 mL via INTRAVENOUS

## 2021-07-29 MED ORDER — ACETAMINOPHEN 325 MG PO TABS
650.0000 mg | ORAL_TABLET | Freq: Four times a day (QID) | ORAL | Status: DC | PRN
  Administered 2021-07-30 – 2021-07-31 (×2): 650 mg via ORAL
  Filled 2021-07-29 (×2): qty 2

## 2021-07-29 MED ORDER — MORPHINE SULFATE (PF) 4 MG/ML IV SOLN
4.0000 mg | Freq: Once | INTRAVENOUS | Status: AC
  Administered 2021-07-29: 4 mg via INTRAVENOUS
  Filled 2021-07-29: qty 1

## 2021-07-29 MED ORDER — AMLODIPINE BESYLATE 10 MG PO TABS
10.0000 mg | ORAL_TABLET | Freq: Every day | ORAL | Status: DC
  Administered 2021-07-29 – 2021-07-31 (×3): 10 mg via ORAL
  Filled 2021-07-29: qty 1
  Filled 2021-07-29: qty 2
  Filled 2021-07-29: qty 1

## 2021-07-29 MED ORDER — LEVOFLOXACIN IN D5W 750 MG/150ML IV SOLN
750.0000 mg | Freq: Once | INTRAVENOUS | Status: AC
  Administered 2021-07-29: 750 mg via INTRAVENOUS
  Filled 2021-07-29: qty 150

## 2021-07-29 MED ORDER — SODIUM CHLORIDE 0.9 % IV SOLN
500.0000 mg | INTRAVENOUS | Status: AC
  Administered 2021-07-29 – 2021-07-31 (×3): 500 mg via INTRAVENOUS
  Filled 2021-07-29 (×3): qty 5

## 2021-07-29 MED ORDER — LEVOFLOXACIN IN D5W 750 MG/150ML IV SOLN: 750.0000 mg | INTRAVENOUS | Status: DC

## 2021-07-29 MED ORDER — IOHEXOL 350 MG/ML SOLN
75.0000 mL | Freq: Once | INTRAVENOUS | Status: AC | PRN
  Administered 2021-07-29: 75 mL via INTRAVENOUS

## 2021-07-29 MED ORDER — PANTOPRAZOLE SODIUM 40 MG PO TBEC
40.0000 mg | DELAYED_RELEASE_TABLET | Freq: Two times a day (BID) | ORAL | Status: DC
  Administered 2021-07-29 – 2021-07-31 (×5): 40 mg via ORAL
  Filled 2021-07-29 (×5): qty 1

## 2021-07-29 MED ORDER — MONTELUKAST SODIUM 10 MG PO TABS
10.0000 mg | ORAL_TABLET | Freq: Every day | ORAL | Status: DC
  Administered 2021-07-29 – 2021-07-30 (×2): 10 mg via ORAL
  Filled 2021-07-29 (×2): qty 1

## 2021-07-29 MED ORDER — IPRATROPIUM-ALBUTEROL 0.5-2.5 (3) MG/3ML IN SOLN
3.0000 mL | Freq: Once | RESPIRATORY_TRACT | Status: AC
  Administered 2021-07-29: 3 mL via RESPIRATORY_TRACT
  Filled 2021-07-29: qty 3

## 2021-07-29 MED ORDER — ONDANSETRON HCL 4 MG/2ML IJ SOLN
4.0000 mg | Freq: Once | INTRAMUSCULAR | Status: AC
  Administered 2021-07-29: 4 mg via INTRAVENOUS
  Filled 2021-07-29: qty 2

## 2021-07-29 MED ORDER — LORATADINE 10 MG PO TABS
10.0000 mg | ORAL_TABLET | Freq: Every day | ORAL | Status: DC
  Administered 2021-07-29 – 2021-07-31 (×3): 10 mg via ORAL
  Filled 2021-07-29 (×3): qty 1

## 2021-07-29 MED ORDER — LEVOTHYROXINE SODIUM 75 MCG PO TABS
75.0000 ug | ORAL_TABLET | Freq: Every day | ORAL | Status: DC
  Administered 2021-07-29 – 2021-07-31 (×3): 75 ug via ORAL
  Filled 2021-07-29 (×3): qty 1

## 2021-07-29 MED ORDER — ACETAMINOPHEN 650 MG RE SUPP: 650.0000 mg | Freq: Four times a day (QID) | RECTAL | Status: DC | PRN

## 2021-07-29 MED ORDER — ALBUTEROL SULFATE (2.5 MG/3ML) 0.083% IN NEBU: 2.5000 mg | INHALATION_SOLUTION | RESPIRATORY_TRACT | Status: DC | PRN

## 2021-07-29 MED ORDER — SODIUM CHLORIDE 0.9 % IV BOLUS
1000.0000 mL | Freq: Once | INTRAVENOUS | Status: AC
  Administered 2021-07-29: 1000 mL via INTRAVENOUS

## 2021-07-29 MED ORDER — ARFORMOTEROL TARTRATE 15 MCG/2ML IN NEBU
15.0000 ug | INHALATION_SOLUTION | Freq: Two times a day (BID) | RESPIRATORY_TRACT | Status: DC
  Administered 2021-07-29 – 2021-07-31 (×5): 15 ug via RESPIRATORY_TRACT
  Filled 2021-07-29 (×5): qty 2

## 2021-07-29 MED ORDER — GUAIFENESIN ER 600 MG PO TB12
600.0000 mg | ORAL_TABLET | Freq: Two times a day (BID) | ORAL | Status: DC
  Administered 2021-07-29 – 2021-07-31 (×5): 600 mg via ORAL
  Filled 2021-07-29 (×5): qty 1

## 2021-07-29 MED ORDER — NORETHINDRONE ACET-ETHINYL EST 1-20 MG-MCG PO TABS
1.0000 | ORAL_TABLET | Freq: Every day | ORAL | Status: DC
  Administered 2021-07-30: 1 via ORAL

## 2021-07-29 MED ORDER — PAROXETINE HCL 30 MG PO TABS
60.0000 mg | ORAL_TABLET | Freq: Every day | ORAL | Status: DC
  Administered 2021-07-30: 60 mg via ORAL
  Filled 2021-07-29 (×4): qty 2

## 2021-07-29 MED ORDER — TRAZODONE HCL 50 MG PO TABS
100.0000 mg | ORAL_TABLET | Freq: Every day | ORAL | Status: DC
  Administered 2021-07-29 – 2021-07-30 (×2): 100 mg via ORAL
  Filled 2021-07-29 (×2): qty 2

## 2021-07-29 MED ORDER — METOPROLOL TARTRATE 5 MG/5ML IV SOLN
5.0000 mg | Freq: Once | INTRAVENOUS | Status: AC
  Administered 2021-07-29: 5 mg via INTRAVENOUS
  Filled 2021-07-29: qty 5

## 2021-07-29 MED ORDER — SODIUM CHLORIDE 0.9 % IV SOLN
2.0000 g | INTRAVENOUS | Status: DC
  Administered 2021-07-29 – 2021-07-31 (×3): 2 g via INTRAVENOUS
  Filled 2021-07-29 (×3): qty 20

## 2021-07-29 MED ORDER — ONDANSETRON HCL 4 MG/2ML IJ SOLN: 4.0000 mg | Freq: Four times a day (QID) | INTRAMUSCULAR | Status: DC | PRN

## 2021-07-29 MED ORDER — PREDNISONE 20 MG PO TABS
40.0000 mg | ORAL_TABLET | Freq: Every day | ORAL | Status: DC
  Administered 2021-07-30 – 2021-07-31 (×2): 40 mg via ORAL
  Filled 2021-07-29 (×2): qty 2

## 2021-07-29 MED ORDER — ALPRAZOLAM 0.5 MG PO TABS
1.0000 mg | ORAL_TABLET | Freq: Two times a day (BID) | ORAL | Status: DC | PRN
  Administered 2021-07-29: 1 mg via ORAL
  Filled 2021-07-29: qty 4

## 2021-07-29 MED ORDER — PROBIOTIC 250 MG PO CAPS: ORAL_CAPSULE | Freq: Every day | ORAL | Status: DC

## 2021-07-29 MED ORDER — METOPROLOL TARTRATE 5 MG/5ML IV SOLN: 5.0000 mg | INTRAVENOUS | Status: DC | PRN

## 2021-07-29 MED ORDER — ALBUTEROL SULFATE (2.5 MG/3ML) 0.083% IN NEBU
2.5000 mg | INHALATION_SOLUTION | Freq: Four times a day (QID) | RESPIRATORY_TRACT | Status: DC
  Administered 2021-07-29: 2.5 mg via RESPIRATORY_TRACT
  Filled 2021-07-29 (×2): qty 3

## 2021-07-29 MED ORDER — METHYLPREDNISOLONE SODIUM SUCC 125 MG IJ SOLR
125.0000 mg | Freq: Once | INTRAMUSCULAR | Status: AC
  Administered 2021-07-29: 125 mg via INTRAVENOUS
  Filled 2021-07-29: qty 2

## 2021-07-29 MED ORDER — BUDESONIDE 0.5 MG/2ML IN SUSP
0.5000 mg | Freq: Two times a day (BID) | RESPIRATORY_TRACT | Status: DC
  Administered 2021-07-29 – 2021-07-31 (×5): 0.5 mg via RESPIRATORY_TRACT
  Filled 2021-07-29 (×5): qty 2

## 2021-07-29 MED ORDER — ENOXAPARIN SODIUM 40 MG/0.4ML IJ SOSY
40.0000 mg | PREFILLED_SYRINGE | Freq: Every day | INTRAMUSCULAR | Status: DC
  Administered 2021-07-29: 40 mg via SUBCUTANEOUS
  Filled 2021-07-29: qty 0.4

## 2021-07-29 MED ORDER — ONDANSETRON HCL 4 MG PO TABS: 4.0000 mg | ORAL_TABLET | Freq: Four times a day (QID) | ORAL | Status: DC | PRN
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
## 2021-07-29 NOTE — ED Provider Notes (Signed)
Care assumed from [place] at shift change pending admission call.  Patient presents with persistent worsening shortness of breath.  Has been treated with multiple courses of steroids and antibiotics, has underlying history of asthma.  Despite treatment here in the emergency department patient continues to be short of breath and with ambulation patient has desatted to 85% and remains tachycardic.  Started on Levaquin for pneumonia seen on CT scan as she is already recently treated with Augmentin and doxycycline.  Consult placed to hospitalist team for admission, I have discussed labs, imaging and pertinent plan with Dr. Fuller Plan and he will see and admit the patient.   Jacqlyn Larsen, PA-C [id number]    Delora Fuel, MD [phone]
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
## 2021-07-29 NOTE — ED Notes (Signed)
Patient transported to CT 

## 2021-07-29 NOTE — Progress Notes (Signed)
New admission to room 5N18. Patient is A&O*4. Able to verbalize her needs to staff. VS checked and cardiac monitor applied. Bed kept in low position and locked. Call bell in reach.

## 2021-07-30 LAB — CBC
HCT: 38 % (ref 36.0–46.0)
Hemoglobin: 12.6 g/dL (ref 12.0–15.0)
MCH: 29.2 pg (ref 26.0–34.0)
MCHC: 33.2 g/dL (ref 30.0–36.0)
MCV: 88.2 fL (ref 80.0–100.0)
Platelets: 353 10*3/uL (ref 150–400)
RBC: 4.31 MIL/uL (ref 3.87–5.11)
RDW: 13.4 % (ref 11.5–15.5)
WBC: 11.2 10*3/uL — ABNORMAL HIGH (ref 4.0–10.5)
nRBC: 0 % (ref 0.0–0.2)

## 2021-07-30 LAB — HIV ANTIBODY (ROUTINE TESTING W REFLEX): HIV Screen 4th Generation wRfx: NONREACTIVE

## 2021-07-30 MED ORDER — ENOXAPARIN SODIUM 80 MG/0.8ML IJ SOSY
70.0000 mg | PREFILLED_SYRINGE | Freq: Every day | INTRAMUSCULAR | Status: DC
  Administered 2021-07-30 – 2021-07-31 (×2): 70 mg via SUBCUTANEOUS
  Filled 2021-07-30 (×3): qty 0.7

## 2021-07-30 NOTE — Plan of Care (Signed)

## 2021-07-30 NOTE — Progress Notes (Signed)
Mobility Specialist Criteria Algorithm Info.    07/30/21 1232  Therapy Vitals  BP (!) 150/88  Patient Position (if appropriate) Sitting (After ambulation)  Pain Assessment  Pain Assessment Faces  Faces Pain Scale 4  Pain Location head  Pain Descriptors / Indicators Headache;Pressure  Pain Intervention(s) Monitored during session  Oxygen Therapy  SpO2 94 %  O2 Device Room Air  Patient Activity (if Appropriate) Ambulating  Pulse Oximetry Type Continuous  Mobility  Bed Position Semi-fowlers  Activity Ambulated independently in hallway  Range of Motion/Exercises Active;All extremities  Level of Assistance Independent  Assistive Device None  Distance Ambulated (ft) 40 ft  Activity Response Tolerated fair;RN notified   Patient received in bed, pleasantly agreed to participate. Ambulated in hallway independently with steady gait. Distance limited secondary to pressure/pain in her head that got more severe with fatigue. Returned to room without incident. HR was elevated 120+ and O2 saturation >94% throughout. BP also elevated 150/88 after ambulation. Symptoms subsided with rest. Was left in bed with all needs met, call bell in reach. RN notified  07/30/2021 12:35 PM  Carolyn Castro, [place], [place]  XQKSK:[phone] Office: [phone]
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
## 2021-07-30 NOTE — Plan of Care (Signed)

## 2021-07-31 MED ORDER — AZITHROMYCIN 250 MG PO TABS: ORAL_TABLET | ORAL | 0 refills | Status: DC

## 2021-07-31 MED ORDER — PREDNISONE 10 MG PO TABS: ORAL_TABLET | ORAL | 0 refills | Status: AC

## 2021-07-31 NOTE — Plan of Care (Signed)
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
## 2021-07-31 NOTE — Progress Notes (Signed)
°  Transition of Care [facility]) Screening Note   Patient Details  Name: Carolyn Castro Date of Birth: 06-Feb-1981   Transition of Care [facility]) CM/SW Contact:    Bess Kinds, RN Phone Number: [phone] 07/31/2021, 10:02 AM    Transition of Care Department [facility]) has reviewed patient and no TOC needs have been identified at this time.

## 2021-08-03 ENCOUNTER — Other Ambulatory Visit: Payer: Self-pay

## 2021-08-03 ENCOUNTER — Encounter: Payer: Self-pay | Admitting: Nurse Practitioner

## 2021-08-03 ENCOUNTER — Telehealth: Payer: Self-pay | Admitting: Nurse Practitioner

## 2021-08-03 ENCOUNTER — Ambulatory Visit (INDEPENDENT_AMBULATORY_CARE_PROVIDER_SITE_OTHER): Payer: BC Managed Care – PPO | Admitting: Nurse Practitioner

## 2021-08-03 VITALS — BP 130/90 | HR 103 | Temp 97.6°F | Ht 64.0 in | Wt 320.4 lb

## 2021-08-03 DIAGNOSIS — G4734 Idiopathic sleep related nonobstructive alveolar hypoventilation: Secondary | ICD-10-CM | POA: Diagnosis not present

## 2021-08-03 DIAGNOSIS — J4541 Moderate persistent asthma with (acute) exacerbation: Secondary | ICD-10-CM | POA: Diagnosis not present

## 2021-08-03 DIAGNOSIS — J45909 Unspecified asthma, uncomplicated: Secondary | ICD-10-CM | POA: Diagnosis not present

## 2021-08-03 DIAGNOSIS — J189 Pneumonia, unspecified organism: Secondary | ICD-10-CM

## 2021-08-03 DIAGNOSIS — J0111 Acute recurrent frontal sinusitis: Secondary | ICD-10-CM | POA: Diagnosis not present

## 2021-08-03 MED ORDER — BREZTRI AEROSPHERE 160-9-4.8 MCG/ACT IN AERO: 2.0000 | INHALATION_SPRAY | Freq: Two times a day (BID) | RESPIRATORY_TRACT | 3 refills | Status: DC

## 2021-08-03 MED ORDER — MOLNUPIRAVIR EUA 200MG CAPSULE: 4.0000 | ORAL_CAPSULE | Freq: Two times a day (BID) | ORAL | 0 refills | Status: AC

## 2021-08-03 MED ORDER — AZELASTINE HCL 0.1 % NA SOLN: 2.0000 | Freq: Two times a day (BID) | NASAL | 2 refills | Status: DC

## 2021-08-03 MED ORDER — PROMETHAZINE-CODEINE 6.25-10 MG/5ML PO SYRP: 5.0000 mL | ORAL_SOLUTION | Freq: Four times a day (QID) | ORAL | 0 refills | Status: DC | PRN

## 2021-08-03 MED ORDER — PSEUDOEPHEDRINE-GUAIFENESIN ER 60-600 MG PO TB12: 1.0000 | ORAL_TABLET | Freq: Two times a day (BID) | ORAL | 0 refills | Status: DC

## 2021-08-03 MED ORDER — BREZTRI AEROSPHERE 160-9-4.8 MCG/ACT IN AERO: 2.0000 | INHALATION_SPRAY | Freq: Two times a day (BID) | RESPIRATORY_TRACT | 0 refills | Status: DC

## 2021-08-03 NOTE — Assessment & Plan Note (Signed)
Stable. Continue supplemental O2 5 lpm at night. Walking oximetry in hospital without desaturations. O2 stable in office today.

## 2021-08-03 NOTE — Telephone Encounter (Signed)
As long as her sx started in the last 5 days, she would be a candidate for molnupiravir.   Please send molnupiravir 800mg  bid x 5 days. Thanks,

## 2021-08-03 NOTE — Assessment & Plan Note (Addendum)
Tx in December and earlier this month with doxycycline and augmentin. Currently on z pack. Continue Allegra, Singulair, and nasonex. Astelin nasal spray Twice daily and nasal irrigations advised. Mucinex D - monitor BP and change to mucinex if increased BP occurs. Tylenol 650 mg every 6 hours as needed for headaches OTC. Given recurrent, persistent sinusitis, will refer back to ENT for further eval and management.
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
## 2021-08-03 NOTE — Patient Instructions (Addendum)
Stop Symbicort. Start Breztri 2 puffs Twice daily with spacer. Brush tongue and rinse mouth afterwards -Continue prednisone taper as previously prescribed -Finish z pack in it's entirety -Continue Allegra 180 mg daily -Continue Singulair 10 mg At bedtime  -Continue omeprazole 20 mg Twice daily  -Continue supplemental O2 5 lpm at night -Continue nasonex nasal spray daily  -Astelin nasal spray 2 sprays each nostril Twice daily  -Mucinex D Twice daily. Monitor BP and stop taking if >140/90 -Phenergen with codeine 5 mL every 6 hours as needed for cough. May cause drowsiness. -Nasal irrigations 1-2 times a day  Asthma Action Plan in place Rinse mouth after inhaled corticosteroid use.  Avoid triggers, when able.  Exercise encouraged. Notify if worsening symptoms upon exertion.  Notify and seek help if symptoms unrelieved by rescue inhaler.  Notify if worsening shortness of breath, productive cough, or fevers develop.  Repeat CT chest in 6 weeks  Follow up in 6 weeks with Dr. Delton Coombes or Philis Nettle. If symptoms do not improve or worsen, please contact office for sooner follow up or seek emergency care.

## 2021-08-03 NOTE — Telephone Encounter (Signed)
Spoke to patient. Patient tested positive for covid after visit with Katie today.  Currently on prednisone and zpak. C/o prod cough with green sputum, increased sob, mild wheezing, headache, body aches and and chest tightness. Sx started yesterday. Denied fever, chills or additional sx She wears 2L QHS. She has not been monitoring spo2.  She is using albuterol HFA TID, albuterol solution once daily and Breztri BID. Fully vaccinated against covid and flu.  Dr. Delton Coombes, please advise. Thanks

## 2021-08-03 NOTE — Assessment & Plan Note (Signed)
Symptoms stable without recent fevers, chills or productive cough. Currently on prednisone and z pack. Previously completed 2 abx courses. Suspected to be viral. Return precautions discussed. Repeat CT chest in 6 weeks with follow up after.

## 2021-08-03 NOTE — Telephone Encounter (Signed)
Patient is aware of recommendations and voiced her understanding.  Molnupiravir sent to preferred pharmacy. Nothing further needed.

## 2021-08-03 NOTE — Assessment & Plan Note (Addendum)
Moderate improvement in symptoms. Will continue prednisone taper and z pack. Step up to triple therapy with Breztri 2 puffs Twice daily. Suspect cough multifactorial r/t asthmatic bronchitis and postnasal drip. Return precautions discussed if symptoms were to worsen or not improve.   Patient Instructions  Stop Symbicort. Start Breztri 2 puffs Twice daily with spacer. Brush tongue and rinse mouth afterwards -Continue prednisone taper as previously prescribed -Finish z pack in it's entirety -Continue Allegra 180 mg daily -Continue Singulair 10 mg At bedtime  -Continue omeprazole 20 mg Twice daily  -Continue supplemental O2 5 lpm at night -Continue nasonex nasal spray daily  -Astelin nasal spray 2 sprays each nostril Twice daily  -Mucinex D Twice daily. Monitor BP and stop taking if >140/90 -Phenergen with codeine 5 mL every 6 hours as needed for cough. May cause drowsiness.  Asthma Action Plan in place Rinse mouth after inhaled corticosteroid use.  Avoid triggers, when able.  Exercise encouraged. Notify if worsening symptoms upon exertion.  Notify and seek help if symptoms unrelieved by rescue inhaler.  Notify if worsening shortness of breath, productive cough, or fevers develop.  Repeat CT chest in 6 weeks  Follow up in 6 weeks with Dr. Delton Coombes or Philis Nettle. If symptoms do not improve or worsen, please contact office for sooner follow up or seek emergency care.

## 2021-08-17 ENCOUNTER — Telehealth: Payer: Self-pay | Admitting: Nurse Practitioner

## 2021-08-17 NOTE — Telephone Encounter (Signed)
The patient feels that she has some congestion, sore throat, shortness of breath with exertion and dry cough.  She was in the hospital recently for PNA and had a negative covid  as of 08/17/21.   She is taking her Daily breztri and her allergy medications with no relief.  She wants to know if she would need to be out of work for a few days to rest and recuperate. Please advise.

## 2021-08-17 NOTE — Telephone Encounter (Signed)
I would recommend she stay out of work for 48 hours. Call if congestion turns purulent or develops fevers. She should take mucinex 600mg  twice daily and use rescue inhaler if she has one every 6 hours. Otherwise needs virtual visit if symptoms do not improve.

## 2021-08-17 NOTE — Telephone Encounter (Signed)
I called the patient and she is aware of the provider recommendations. She did not have any other questions.

## 2021-08-17 NOTE — Telephone Encounter (Signed)
Left message for the patient to call back,

## 2021-08-23 ENCOUNTER — Ambulatory Visit (INDEPENDENT_AMBULATORY_CARE_PROVIDER_SITE_OTHER): Payer: BC Managed Care – PPO | Admitting: Psychiatry

## 2021-08-23 ENCOUNTER — Encounter: Payer: Self-pay | Admitting: Psychiatry

## 2021-08-23 ENCOUNTER — Other Ambulatory Visit: Payer: Self-pay

## 2021-08-23 DIAGNOSIS — F4001 Agoraphobia with panic disorder: Secondary | ICD-10-CM

## 2021-08-23 DIAGNOSIS — F325 Major depressive disorder, single episode, in full remission: Secondary | ICD-10-CM

## 2021-08-23 DIAGNOSIS — F5105 Insomnia due to other mental disorder: Secondary | ICD-10-CM | POA: Diagnosis not present

## 2021-08-23 MED ORDER — ALPRAZOLAM 1 MG PO TABS: 1.0000 mg | ORAL_TABLET | Freq: Two times a day (BID) | ORAL | 3 refills | Status: DC | PRN

## 2021-08-23 MED ORDER — PAROXETINE HCL 30 MG PO TABS: 60.0000 mg | ORAL_TABLET | Freq: Every day | ORAL | 1 refills | Status: DC

## 2021-08-23 MED ORDER — TRAZODONE HCL 50 MG PO TABS: 100.0000 mg | ORAL_TABLET | Freq: Every day | ORAL | 1 refills | Status: DC

## 2021-08-23 NOTE — Progress Notes (Signed)
Carolyn CheshireKaren M Ketron [id number] 07-24-1980 41 y.o.    Subjective:   Patient ID:  Carolyn Castro is a 41 y.o. (DOB 07-24-1980) female.  Chief Complaint:  Chief Complaint  Patient presents with   Follow-up    Panic disorder with agoraphobia     Anxiety   Sleeping Problem    HPI Carolyn Castro presents to the office today for follow-up of panic disorder and depression.    When seen September 26, 2017 she had previously been switched from sertraline to paroxetine 40 mg for panic attacks.  She had a good response until a recent bout of health problems had noticed a resurgence of panic.  We decided to increase paroxetine to greater than the usual 60 mg daily from 40 mg daily on August 23, 2018.    When seen May 2020.Health gotten better and anxiety has come down.  Increase in paroxetine has helped her also.  No med changes were made.  She called back in June 2020 saying that her anxiety and insomnia was worse and she was having to take steroids for asthma.  She was allowed to increase Xanax to 0.5 mg #5 daily.  Encouraged to reduce the dose as soon as possible.  It was discovered that she was also getting Xanax from another provider and she was cautioned about that as being inappropriate. Subsequently she is gotten Xanax from another provider on more than one occasion. She called April 09, 2019 in the catering her anxiety was worse.  She was worked in to an appointment today.  seen April 12, 2019.  She accidentally reduced the dosage a couple of months prior and  and now the anxiety is out of hand again. Therefore  Increase the paroxetine to greater than usual dose at 60 to try to limit the need of Bz.  Increase in paroxetine was previously effective at the higher dose.Marland Kitchen. PDMP shows she is getting Xanax 1 mg quantity 30 from PCP, Dr. Shirlean Mylararol Webb and oxycodone 5 mg tablets and [facility] 40-60 [facility]   She last filled a Xanax prescription from our office in March 2020.   Says she's stopped pain meds.   Last fill date 8/27. 3 ER visits since she was last here for shortness of breath  seen December 2020 & March 2021.  No meds were changed. Increased paroxetine to 60 and it's helped.     02/03/20 appt with the following noted: Consistent with paroxetine 60 since here. Xanax 0.5 mg about twice weekly. Anxiety higher lately.  2 PT jobs including dog sitting.  One of dogs got away and attacked another dog with vet bill.  She had to fork out $1000 to help pay for it.  Realizes she shouldn't have done it.  Got real frutstrated with herself for giving in to their demands. Has done therapy online about twice weekly who is good. Gets numb from stress.  GM died naturally hard.  $ stress.  Louann SjogrenBoss can be demeaning and demanding.  Wants to do international missions but can't DT Covid. Last 2 mos hard bc no recent vacation Dt $ stress as noted with medical bills.  Drained. Sleep well with trazodone. Panic better with paroxetine but not completely stopped but shorter and less severe. Overall really good with panic and using Xanax about 1-2 times weeklly.    Overall anxiety still better with Paxil.  No problems with it.  Likes living at home to get support from parents over her health issues.  Better handling stress and less panic.  Mostly back to normal.  Likes Paxil better than Zoloft.  Not as sig depressive spells either.  Conc and appetite and function is fair.   Early November had pneumonia and otherwise function is good.   Sleeps better with  trazodone.  8-9 hours. Plan continue paroxetine 60 mg daily which was helpful.  No med changes  07/22/2020 appointment with the following noted: Change jobs TA with schools.  At times worries about job performance.  Overall things going well.  Good coworkers.  Since August.  Some worry over saving $ for the summer.  Can get panicky if overthinks that.  Xanax helps.  Anxiety can get triggered at work too excessively.  Some hypervigilance  and startle. Hx severe asthma Plan: continue paroxetine 60, Xanax 1 mg ana d trazodone 100  01/12/2021 appointment with the following noted: Parents got Covid a few weeks ago and recovered except some fatigue. She has not had it. Anxiety comes and goes. Started counseling [facility] to deal with stressors and and anxiety and it's helped. Less need for Xanax.. Trazodone works without hangover.   In a month half of the days are anxious. Can see people that are stressful and trigger anxiety.  Not much avoidance with counseling help. Fell asleep driving recently and cog problems and pending sleep study in the lab toninght.  Not using CPAP but using o2. No med changes    04/19/21 appt noted: A lot going on.  Labor day Covid with sig sx.  Having to use more Xanax; about 1 daily 3 days per week.  It messed with her mind bc it drug on and started getting paranoid about her health. GF MI at church. Had a sent. Dad felll a few days later with arm fx and pending surgery.  Hyped up about him.  Worries over dad's health and super close to him. Able to sleep ok.  Work is fine with long hours. No SE.  Plan no med changes.  08/23/2021 appointment with the following noted: She continues paroxetine 60 mg daily, Xanax 1 mg twice daily, and trazodone 50 to 100 mg nightly Taking Xanax daily. Recent pneumonia with hospitalization and then Covid triggered anxiety over breathing so still taking Xanax to daily with this but easing up this last week.  Severe asthma Recent second Covid less severe than past. Recurring anxiety and panic problems but getting better. OK with depression.  Continues counseling has been helpful. 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 No Se and not sedated specifically. Still working and doing fine there.  Had low back surgery end of June 2020 helped.     Past psych meds:  Zoloft 200 NR & SE, , paxil 60,  Xanax 1 mg BID ., hydroxyzine, trazodone 100 helped. Hx  severe asthma so no propranolol  Review of Systems:  Review of Systems  HENT:  Positive for voice change.   Respiratory:  Positive for shortness of breath and wheezing. Negative for cough and chest tightness.   Gastrointestinal:  Negative for abdominal pain.  Musculoskeletal:  Positive for arthralgias and back pain.  Neurological:  Negative for dizziness and tremors.  Psychiatric/Behavioral:  Negative for agitation, behavioral problems, confusion, decreased concentration, hallucinations, self-injury, sleep disturbance and suicidal ideas. The patient is nervous/anxious. The patient is not hyperactive.  asthma about the same.  Medications: I have reviewed the patient's current medications.  Current Outpatient Medications  Medication Sig Dispense Refill   amLODipine (NORVASC) 10 MG tablet  Take 10 mg by mouth daily.     azelastine (ASTELIN) 0.1 % nasal spray Place 2 sprays into both nostrils 2 (two) times daily. Use in each nostril as directed 30 mL 2   azithromycin (ZITHROMAX) 250 MG tablet Take 1 tab po daily until complete 4 each 0   Budeson-Glycopyrrol-Formoterol (BREZTRI AEROSPHERE) 160-9-4.8 MCG/ACT AERO Inhale 2 puffs into the lungs in the morning and at bedtime. 10.7 g 3   Budeson-Glycopyrrol-Formoterol (BREZTRI AEROSPHERE) 160-9-4.8 MCG/ACT AERO Inhale 2 puffs into the lungs in the morning and at bedtime. 5.9 g 0   budesonide-formoterol (SYMBICORT) 160-4.5 MCG/ACT inhaler Inhale 2 puffs into the lungs 2 (two) times daily.     CRANBERRY PO Take 1 capsule by mouth in the morning and at bedtime.     cyclobenzaprine (FLEXERIL) 10 MG tablet Take 1 tablet (10 mg total) by mouth 2 (two) times daily as needed for muscle spasms. 10 tablet 0   fexofenadine (ALLEGRA) 180 MG tablet Take 180 mg by mouth daily.     levothyroxine (SYNTHROID) 75 MCG tablet Take 75 mcg by mouth daily before breakfast.     montelukast (SINGULAIR) 10 MG tablet Take 10 mg by mouth at bedtime.     Multiple  Vitamins-Minerals (MULTIVITAMIN WOMEN) TABS Take 1 tablet by mouth daily.     norethindrone-ethinyl estradiol (LOESTRIN) 1-20 MG-MCG tablet Take 1 tablet by mouth daily.     omeprazole (PRILOSEC) 20 MG capsule Take 20 mg by mouth 2 (two) times daily.     ondansetron (ZOFRAN) 4 MG tablet Take 4 mg by mouth every 8 (eight) hours as needed for nausea or vomiting.     Probiotic Product (PROBIOTIC PO) Take 1 capsule by mouth daily at 12 noon.     ALPRAZolam (XANAX) 1 MG tablet Take 1 tablet (1 mg total) by mouth 2 (two) times daily as needed for anxiety. 30 tablet 3   PARoxetine (PAXIL) 30 MG tablet Take 2 tablets (60 mg total) by mouth at bedtime. 180 tablet 1   traZODone (DESYREL) 50 MG tablet Take 2 tablets (100 mg total) by mouth at bedtime. 180 tablet 1   No current facility-administered medications for this visit.    Medication Side Effects: None  Allergies:  Allergies  Allergen Reactions   Ciprofloxacin Hcl Diarrhea and Other (See Comments)    Developed C-Diff   Fluticasone Propionate     Other reaction(s): Headaches   Triamcinolone Acetonide     Other reaction(s): rash on face    Past Medical History:  Diagnosis Date   Anxiety    Asthma    Back pain    Constipation    Fatigue    Hypertension    Hypothyroid    Hypothyroidism     Family History  Problem Relation Age of Onset   Hypertension Mother    Hypertension Father    Adrenal disorder Father        uncertain type    Social History   Socioeconomic History   Marital status: Single    Spouse name: Not on file   Number of children: 0   Years of education: 16   Highest education level: Not on file  Occupational History   Not on file  Tobacco Use   Smoking status: Never   Smokeless tobacco: Never  Vaping Use   Vaping Use: Never used  Substance and Sexual Activity   Alcohol use: Never   Drug use: Never   Sexual activity: Not Currently  Birth control/protection: Pill  Other Topics Concern   Not on file   Social History Narrative   Left handed   One story home   Drinks caffeine   Social Determinants of Health   Financial Resource Strain: Not on file  Food Insecurity: Not on file  Transportation Needs: Not on file  Physical Activity: Not on file  Stress: Not on file  Social Connections: Not on file  Intimate Partner Violence: Not on file    Past Medical History, Surgical history, Social history, and Family history were reviewed and updated as appropriate.   Please see review of systems for further details on the patient's review from today.   Objective:   Physical Exam:  There were no vitals taken for this visit.  Physical Exam Constitutional:      General: She is not in acute distress.    Appearance: She is obese.  Neurological:     Mental Status: She is alert and oriented to person, place, and time.  Psychiatric:        Attention and Perception: She is attentive. She does not perceive auditory hallucinations.        Mood and Affect: Mood is anxious. Mood is not depressed. Affect is not labile, blunt, angry or inappropriate.        Speech: Speech normal. Speech is not slurred.        Behavior: Behavior normal. Behavior is not slowed or withdrawn.        Thought Content: Thought content normal. Thought content is not delusional. Thought content does not include homicidal or suicidal ideation. Thought content does not include suicidal plan.        Cognition and Memory: Cognition normal.        Judgment: Judgment normal.     Comments: Insight intact. Not depressed No auditory or visual hallucinations.  Anxiety a little worse with stress and still worries . Talkative.    Lab Review:     Component Value Date/Time   NA 135 07/29/2021 0416   K 3.5 07/29/2021 0416   CL 103 07/29/2021 0416   CO2 21 (L) 07/29/2021 0416   GLUCOSE 117 (H) 07/29/2021 0416   BUN 9 07/29/2021 0416   CREATININE 0.96 07/29/2021 0416   CALCIUM 8.5 (L) 07/29/2021 0416   PROT 7.0 10/28/2020 0936    ALBUMIN 3.3 (L) 10/28/2020 0936   AST 34 10/28/2020 0936   ALT 40 10/28/2020 0936   ALKPHOS 69 10/28/2020 0936   BILITOT 0.5 10/28/2020 0936   GFRNONAA >60 07/29/2021 0416   GFRAA >60 12/26/2019 1440       Component Value Date/Time   WBC 11.2 (H) 07/30/2021 0119   RBC 4.31 07/30/2021 0119   HGB 12.6 07/30/2021 0119   HCT 38.0 07/30/2021 0119   PLT 353 07/30/2021 0119   MCV 88.2 07/30/2021 0119   MCH 29.2 07/30/2021 0119   MCHC 33.2 07/30/2021 0119   RDW 13.4 07/30/2021 0119   LYMPHSABS 1.6 10/28/2020 0936   MONOABS 0.5 10/28/2020 0936   EOSABS 0.1 10/28/2020 0936   BASOSABS 0.0 10/28/2020 0936    No results found for: POCLITH, LITHIUM   No results found for: PHENYTOIN, PHENOBARB, VALPROATE, CBMZ   .res Assessment: Plan:    Panic disorder with agoraphobia - Plan: ALPRAZolam (XANAX) 1 MG tablet, PARoxetine (PAXIL) 30 MG tablet  Major depressive disorder with single episode, in full remission (HCC) - Plan: PARoxetine (PAXIL) 30 MG tablet  Insomnia due to mental condition - Plan: traZODone (  DESYREL) 50 MG tablet   Greater than 50% of 30 min face to face time with patient was spent on counseling and coordination of care. We discussed History of severe panic disorder and mild to moderate major depression which did not respond to sertraline 200 mg a day.  Continue paroxetine to greater than usual dose at 60 to try to limit the need of Bz.  It has helped to partially manage the anxiety and mood.  Increase in paroxetine was effective at further reducing anxiety below the level she had at 40 mg a day Residual anxiety.  Consider even higher dose for TR anxiety.  Disc SE   She remains easily stressed.  Betther with job change.  sleep study pending showed low O2 at night and on 02 now..  No OSA .Marland Kitchen.  Disc importance of oxygen levels to brain and mental health.  We discussed the short-term risks associated with benzodiazepines including sedation and increased fall risk among others.   Discussed long-term side effect risk including dependence, potential withdrawal symptoms, and the potential eventual dose-related risk of dementia.  Disc risk with opiates.  She is not planning on consistent opiates.  Disc risk of increasing the Xanax.  She's using sparingly. Continue Xanax 1 mg BID prn  For sleep continue trazodone 50-100 mg HS. It helps stop worry at night.  Continue therapy if helpful. Going twice monthly  No med changes  Follow-up 4 -6 mos.  She agrees with the plan. No changes without calling.  Consider increase paroxetine.  Meredith Staggersarey Cottle MD, DFAPA  Please see After Visit Summary for patient specific instructions.  Future Appointments  Date Time Provider Department Center  08/31/2021  8:30 AM [facility]  09/28/2021  4:15 PM Byrum, Les Pouobert S, MD [facility] None    No orders of the defined types were placed in this encounter.      -------------------------------

## 2021-08-31 ENCOUNTER — Other Ambulatory Visit: Payer: Self-pay

## 2021-08-31 ENCOUNTER — Ambulatory Visit (INDEPENDENT_AMBULATORY_CARE_PROVIDER_SITE_OTHER)
Admission: RE | Admit: 2021-08-31 | Discharge: 2021-08-31 | Disposition: A | Discharge status: Home / Self Care | Payer: BC Managed Care – PPO | Source: Ambulatory Visit | Attending: Nurse Practitioner | Admitting: Nurse Practitioner

## 2021-08-31 DIAGNOSIS — J189 Pneumonia, unspecified organism: Secondary | ICD-10-CM | POA: Diagnosis not present

## 2021-09-01 ENCOUNTER — Telehealth: Payer: Self-pay | Admitting: Nurse Practitioner

## 2021-09-01 NOTE — Telephone Encounter (Signed)
Spoke to patient. ?She is requesting CT results from 08/31/2021. She is aware that scan has not been read by radiology yet. Advised patient that our office will contact her with results once read.  ?Nothing further needed.  ? ? ? ?

## 2021-09-02 NOTE — Progress Notes (Signed)
Please notify pt that her CT chest scan showed resolution of her pneumonia. She has a mild amount of atelectasis in this area which is likely residual. Deep breathing exercises and activity as tolerated. She has a 5.4 mm nodule which is stable when compared to her previous exams. Follow up with Dr. Lamonte Sakai as scheduled on 3/28. Thanks!

## 2021-09-05 ENCOUNTER — Emergency Department (HOSPITAL_BASED_OUTPATIENT_CLINIC_OR_DEPARTMENT_OTHER)
Admission: EM | Admit: 2021-09-05 | Discharge: 2021-09-05 | Disposition: A | Discharge status: Home / Self Care | Payer: BC Managed Care – PPO | Attending: Emergency Medicine | Admitting: Emergency Medicine

## 2021-09-05 ENCOUNTER — Other Ambulatory Visit: Payer: Self-pay

## 2021-09-05 ENCOUNTER — Encounter (HOSPITAL_BASED_OUTPATIENT_CLINIC_OR_DEPARTMENT_OTHER): Payer: Self-pay | Admitting: Emergency Medicine

## 2021-09-05 ENCOUNTER — Emergency Department (HOSPITAL_BASED_OUTPATIENT_CLINIC_OR_DEPARTMENT_OTHER): Payer: BC Managed Care – PPO

## 2021-09-05 DIAGNOSIS — I10 Essential (primary) hypertension: Secondary | ICD-10-CM | POA: Diagnosis not present

## 2021-09-05 DIAGNOSIS — S61214A Laceration without foreign body of right ring finger without damage to nail, initial encounter: Secondary | ICD-10-CM | POA: Insufficient documentation

## 2021-09-05 DIAGNOSIS — W540XXA Bitten by dog, initial encounter: Secondary | ICD-10-CM | POA: Insufficient documentation

## 2021-09-05 DIAGNOSIS — E039 Hypothyroidism, unspecified: Secondary | ICD-10-CM | POA: Insufficient documentation

## 2021-09-05 DIAGNOSIS — Z79899 Other long term (current) drug therapy: Secondary | ICD-10-CM | POA: Insufficient documentation

## 2021-09-05 DIAGNOSIS — M545 Low back pain, unspecified: Secondary | ICD-10-CM

## 2021-09-05 DIAGNOSIS — J45909 Unspecified asthma, uncomplicated: Secondary | ICD-10-CM | POA: Diagnosis not present

## 2021-09-05 DIAGNOSIS — L03011 Cellulitis of right finger: Secondary | ICD-10-CM | POA: Diagnosis not present

## 2021-09-05 DIAGNOSIS — R000 Tachycardia, unspecified: Secondary | ICD-10-CM | POA: Insufficient documentation

## 2021-09-05 DIAGNOSIS — S6991XA Unspecified injury of right wrist, hand and finger(s), initial encounter: Secondary | ICD-10-CM | POA: Diagnosis present

## 2021-09-05 DIAGNOSIS — S3992XA Unspecified injury of lower back, initial encounter: Secondary | ICD-10-CM | POA: Diagnosis not present

## 2021-09-05 MED ORDER — AMOXICILLIN-POT CLAVULANATE 875-125 MG PO TABS
1.0000 | ORAL_TABLET | Freq: Once | ORAL | Status: AC
  Administered 2021-09-05: 1 via ORAL
  Filled 2021-09-05: qty 1

## 2021-09-05 MED ORDER — AMOXICILLIN-POT CLAVULANATE 875-125 MG PO TABS: 1.0000 | ORAL_TABLET | Freq: Two times a day (BID) | ORAL | 0 refills | Status: AC

## 2021-09-05 MED ORDER — KETOROLAC TROMETHAMINE 30 MG/ML IJ SOLN
30.0000 mg | Freq: Once | INTRAMUSCULAR | Status: AC
  Administered 2021-09-05: 30 mg via INTRAMUSCULAR
  Filled 2021-09-05: qty 1

## 2021-09-05 NOTE — ED Triage Notes (Signed)
Pt arrives pov, ambulatory to triage with c/o dog bite to right hand yesterday by known animal, vaccines UTD. Pt also c/o lower back pain r/t dog encounter. Swelling and redness noted ?

## 2021-09-05 NOTE — Discharge Instructions (Signed)
I have prescribed you an antibiotic for the wound on your hand. Please keep this area clean. If you notice the infection getting any worse while on antibiotics, please return to the emergency department. Otherwise, follow up with your PCP next week.  ?For your lower back pain, I recommend taking Ibuprofen or Naproxen as needed, heating pad, and gentle stretching. Please see your PCP regarding further management of this.  ?

## 2021-09-05 NOTE — ED Provider Notes (Signed)
[facility] EMERGENCY DEPARTMENT Provider Note   CSN: [id number] Arrival date & time: 09/05/21  0932     History  Chief Complaint  Patient presents with   Animal Bite    Carolyn CheshireKaren M Winstead is a 41 y.o. female.  Past medical history of hypertension, hypothyroidism, and asthma.  Patient presents the emergency department with a chief complaint of animal bite and lower back pain.  She says that yesterday she was feeding her friends dogs which were 3 pit bulls.  Apparently 2 of the dog started to get in a fight and she got involved.  She sustained a wound to her right finger.  She says since the injury, the area around the laceration has become more red and swollen.  She denies any fevers or chills.  She denies any other systemic symptoms.  She is up-to-date on her tetanus shot.  She states that these dogs are all vaccinated.  She also states that she is when she bent over to stop the fighting, she bent over and she sustained a injury to her left lower back.  She states that the pain has not gotten any better or worse since then.  She denies any shooting pains down her legs.  She denies any numbness, weakness, or balance issues.  She denies any saddle anesthesia, bowel or bladder dysfunction, or any other concerning lower back symptoms.   Animal Bite     Home Medications Prior to Admission medications   Medication Sig Start Date End Date Taking? Authorizing Provider  amoxicillin-clavulanate (AUGMENTIN) 875-125 MG tablet Take 1 tablet by mouth every 12 (twelve) hours for 7 days. 09/05/21 09/12/21 Yes Aldon Hengst, Finis BudGrace C, PA-C  ALPRAZolam (XANAX) 1 MG tablet Take 1 tablet (1 mg total) by mouth 2 (two) times daily as needed for anxiety. 08/23/21   Cottle, Steva Readyarey G Jr., MD  amLODipine (NORVASC) 10 MG tablet Take 10 mg by mouth daily.    [provider]  azelastine (ASTELIN) 0.1 % nasal spray Place 2 sprays into both nostrils 2 (two) times daily. Use in each nostril as directed  08/03/21   Cobb, Ruby ColaKatherine V, NP  azithromycin (ZITHROMAX) 250 MG tablet Take 1 tab po daily until complete 07/31/21   Azucena FallenLancaster, William C, MD  Budeson-Glycopyrrol-Formoterol (BREZTRI AEROSPHERE) 160-9-4.8 MCG/ACT AERO Inhale 2 puffs into the lungs in the morning and at bedtime. 08/03/21   Cobb, Ruby ColaKatherine V, NP  Budeson-Glycopyrrol-Formoterol (BREZTRI AEROSPHERE) 160-9-4.8 MCG/ACT AERO Inhale 2 puffs into the lungs in the morning and at bedtime. 08/03/21   Cobb, Ruby ColaKatherine V, NP  budesonide-formoterol (SYMBICORT) 160-4.5 MCG/ACT inhaler Inhale 2 puffs into the lungs 2 (two) times daily.    [provider]  CRANBERRY PO Take 1 capsule by mouth in the morning and at bedtime.    [provider]  cyclobenzaprine (FLEXERIL) 10 MG tablet Take 1 tablet (10 mg total) by mouth 2 (two) times daily as needed for muscle spasms. 08/26/19   Luevenia MaxinFawze, Mina A, PA-C  fexofenadine (ALLEGRA) 180 MG tablet Take 180 mg by mouth daily.    [provider]  levothyroxine (SYNTHROID) 75 MCG tablet Take 75 mcg by mouth daily before breakfast.    [provider]  montelukast (SINGULAIR) 10 MG tablet Take 10 mg by mouth at bedtime.    [provider]  Multiple Vitamins-Minerals (MULTIVITAMIN WOMEN) TABS Take 1 tablet by mouth daily.    [provider]  norethindrone-ethinyl estradiol (LOESTRIN) 1-20 MG-MCG tablet Take 1 tablet by mouth daily. 03/29/21  [provider]  omeprazole (PRILOSEC) 20 MG capsule Take 20 mg by mouth 2 (two) times daily. 05/10/21   [provider]  ondansetron (ZOFRAN) 4 MG tablet Take 4 mg by mouth every 8 (eight) hours as needed for nausea or vomiting.    [provider]  PARoxetine (PAXIL) 30 MG tablet Take 2 tablets (60 mg total) by mouth at bedtime. 08/23/21   Cottle, Steva Ready., MD  Probiotic Product (PROBIOTIC PO) Take 1 capsule by mouth daily at 12 noon.    [provider]  traZODone (DESYREL) 50 MG tablet Take 2  tablets (100 mg total) by mouth at bedtime. 08/23/21   Cottle, Steva Ready., MD      Allergies    Ciprofloxacin hcl, Fluticasone propionate, and Triamcinolone acetonide    Review of Systems   Review of Systems  Musculoskeletal:  Positive for back pain.  Skin:  Positive for wound.  All other systems reviewed and are negative.  Physical Exam Updated Vital Signs BP (!) 148/111    Pulse (!) 110    Temp 98.6 F (37 C) (Oral)    Resp 18    Ht 5\' 4"  (1.626 m)    Wt (!) 140.6 kg    SpO2 96%    BMI 53.21 kg/m  Physical Exam Vitals and nursing note reviewed.  Constitutional:      General: She is not in acute distress.    Appearance: Normal appearance. She is well-developed. She is obese. She is not ill-appearing, toxic-appearing or diaphoretic.  HENT:     Head: Normocephalic and atraumatic.     Nose: No nasal deformity.     Mouth/Throat:     Lips: Pink. No lesions.  Eyes:     General: Gaze aligned appropriately. No scleral icterus.       Right eye: No discharge.        Left eye: No discharge.     Conjunctiva/sclera: Conjunctivae normal.     Right eye: Right conjunctiva is not injected. No exudate or hemorrhage.    Left eye: Left conjunctiva is not injected. No exudate or hemorrhage. Pulmonary:     Effort: Pulmonary effort is normal. No respiratory distress.  Musculoskeletal:     Comments: No midline tenderness of spine, no stepoff or deformity; reproducible muscular tenderness in left paraspinal muscles DP/PT pulses 2+ and equal bilaterally No leg edema Sensation grossly intact on anterior thighs, dorsum of foot and lateral foot Strength of knee flexion and extension is 5/5 Plantar and dorsiflexion of ankle 5/5 Gait normal   Skin:    General: Skin is warm and dry.     Findings: Wound present.     Comments: Wound to the tip of the right middle finger.  There is a small superficial cut with surrounding erythema and mild swelling.  Patient has good capillary refill.  She is able to  slightly bend that joint.  The wound does not look like it goes into the joint cavity.  It seems very limited to the surrounding skin.  Neurological:     Mental Status: She is alert and oriented to person, place, and time.  Psychiatric:        Mood and Affect: Mood normal.        Speech: Speech normal.        Behavior: Behavior normal. Behavior is cooperative.        ED Results / Procedures / Treatments   Labs (all labs ordered are listed, but only  abnormal results are displayed) Labs Reviewed - No data to display  EKG None  Radiology DG Hand Complete Right  Result Date: 09/05/2021 CLINICAL DATA:  Patient complains of dog bite to right hand. Swelling and redness noted. EXAM: RIGHT HAND - COMPLETE 3+ VIEW COMPARISON:  None. FINDINGS: There is diffuse soft tissue swelling. No acute fracture or dislocation identified. No radiopaque foreign bodies. IMPRESSION: 1. No acute bone abnormality. 2. Soft tissue swelling. Electronically Signed   By: Signa Kell M.D.   On: 09/05/2021 10:11    Procedures Procedures    Medications Ordered in ED Medications  ketorolac (TORADOL) 30 MG/ML injection 30 mg (has no administration in time range)  amoxicillin-clavulanate (AUGMENTIN) 875-125 MG per tablet 1 tablet (has no administration in time range)    ED Course/ Medical Decision Making/ A&P                           Medical Decision Making Amount and/or Complexity of Data Reviewed Radiology: ordered.  Risk Prescription drug management.    MDM  This is a 41 y.o. female who presents to the ED with lower back pain after an incident where she sustained a bite from a vaccinated dog.   I personally ordered, reviewed, and interpreted all laboratory work and imaging and agree with radiologist interpretation. Results interpreted below: X-ray negative for fracture.  It does show some soft tissue swelling.  My Impression, Plan, and ED Course:  Injury is to the distal tip of the right middle  finger.  There is surrounding erythema and mild swelling.  Wound seems very superficial and does not extend into the tendon or joint.  X-ray reveals no underlying fracture.  Patient appears well and is not systemically sick.  She does have tachycardia to 110, however I reviewed past vital signs and she regularly has a heart rate in the low 100s to 110s.  Patient confirms this is being accurate.  I think there is a superficial soft tissue infection overlying the wound.  She will need antibiotics to treat this.  Since is a dog bite, we will use Augmentin. She needs to return to the ED if the infection worsens at all while on antibiotics. At this point, we would likely need to involve hand surgery. She should f/u with PCP in one week.  Lower back pain is on the left side with no sciatica.  There are no red flag symptoms.  I can reproduce the pain with palpation of the lower back.  There is no midline tenderness.  I do not think that we need further imaging of her lower back.  I recommend supportive treatment such as anti-inflammatories, gentle stretching, and heating pad.  Recommend following up with PCP regarding this.      Charting Requirements Additional history is obtained from:  Independent historian External Records from outside source obtained and reviewed including: n/a Social Determinants of Health:  none Pertinant PMH that complicates patient's illness: HTN, Hypothyroidism, Anxiety  Patient Care Problems that were addressed during this visit: - Soft tissue infection of animal bite: Acute illness with complication - Left lower back pain without sciatica: Acute illness Medications given in ED: IM toradol, first dose of Augmentin Reevaluation of the patient after these medicines showed that the patient stayed the same Disposition: antibiotics for infection, supportive care for back pain. f/u with PCP. Strict return precautions  I have discussed this patient with my attending physician, Dr.  Criss Alvine who has  made changes to the plan accordingly.  Portions of this note were generated with Scientist, clinical (histocompatibility and immunogenetics). Dictation errors may occur despite best attempts at proofreading.    Final Clinical Impression(s) / ED Diagnoses Final diagnoses:  Dog bite, initial encounter  Acute left-sided low back pain without sciatica  Cellulitis of finger of right hand    Rx / DC Orders ED Discharge Orders          Ordered    amoxicillin-clavulanate (AUGMENTIN) 875-125 MG tablet  Every 12 hours        09/05/21 1128              Jonthan Leite, Finis Bud, PA-C 09/05/21 1134    Pricilla Loveless, MD 09/06/21 1110

## 2021-09-10 ENCOUNTER — Encounter: Payer: Self-pay | Admitting: Emergency Medicine

## 2021-09-10 ENCOUNTER — Ambulatory Visit: Payer: BC Managed Care – PPO | Admitting: Emergency Medicine

## 2021-09-10 ENCOUNTER — Other Ambulatory Visit: Payer: Self-pay

## 2021-09-10 ENCOUNTER — Ambulatory Visit (INDEPENDENT_AMBULATORY_CARE_PROVIDER_SITE_OTHER): Payer: BC Managed Care – PPO | Admitting: Emergency Medicine

## 2021-09-10 DIAGNOSIS — J4541 Moderate persistent asthma with (acute) exacerbation: Secondary | ICD-10-CM | POA: Diagnosis not present

## 2021-09-10 DIAGNOSIS — G4733 Obstructive sleep apnea (adult) (pediatric): Secondary | ICD-10-CM

## 2021-09-10 DIAGNOSIS — K219 Gastro-esophageal reflux disease without esophagitis: Secondary | ICD-10-CM

## 2021-09-10 DIAGNOSIS — J301 Allergic rhinitis due to pollen: Secondary | ICD-10-CM

## 2021-09-10 MED ORDER — PREDNISONE 10 MG PO TABS: ORAL_TABLET | ORAL | 0 refills | Status: DC

## 2021-09-10 NOTE — Progress Notes (Signed)
? ?Subjective:  ? ? Patient ID: Carolyn Castro, female    DOB: Oct 14, 1980, 41 y.o.   MRN: [id number] ? ?HPI ? ?ROV 01/14/21 --follow-up visit 41 year old woman with history of asthma and moderate obstruction by spirometry, also restrictive disease due to obesity.  She has allergic rhinitis and GERD with upper airway irritation and cough.  She has chronic nocturnal hypoxemia. At her last visit she mentioned periods of fogginess, ? Pre-syncope ? ?Split-night sleep study was done 01/12/2021.  The final report is not yet available but on my interpretation it does not look like she had clinically significant OSA.  She did have nocturnal desaturations (which we have previously documented) ? ?She has continued to have some periods of slow responsiveness, cloudy thinking, especially in the mornings. Her breathing is , remains on symbicort, singulair, allegra. Less UA irritation currently. She has xanax, trazadone, paxil, on her med list that could cause side effects above.  ? ? ?Acute OV 09/10/21 --acute office visit for 41 year old woman followed for moderate persistent asthma and restrictive disease due to obesity.  Also with chronic upper airway irritation and cough in the setting of allergic rhinitis, GERD.  She had COVID-19 in late January, was treated with molnupiravir.  She had been treated for sinus symptoms and possible sinusitis with doxycycline and Augmentin prior to this.  A CT scan of her chest done in the emergency department 07/29/2021 for associated shortness of breath no evidence of pulmonary embolism, left upper lobe and lingular opacity, groundglass in the right middle lobe consistent with COVID-19 pneumonitis versus bacterial pneumonia.  She was seen 1/31 after being treated with azithromycin and a prednisone taper in the ED, completed these.  Her Symbicort was changed to [employer].  Continued on Allegra, Singulair, Nasonex, Astelin nasal spray, nasal saline irrigations and Mucinex D.  She was recently  started back on Augmentin in the ED after she had a dog bite 09/05/2021 ?She reports today that she is having lower chest wall and costal margin pain, chest "burning", she has had increased exertional dyspnea. She has had frequent dry cough. Not currently on prednisone. She has an OV scheduled with ENT in April. She believes that the Markus Daft is about the same as the Symbicort.  ? ?Repeat CT scan of the chest 08/31/2021 reviewed by me shows resolution of the lingular infiltrate and right basilar groundglass.  Small amount of left upper lobe linear atelectasis.  There is a stable 5 mm pleural-based noncalcified right middle lobe nodule. ? ?Sleep study 01/12/2021 reviewed showed AHI 8.4/h with 57 hypopneas, lowest SPO2 83%.  The AHI during REM sleep was 44.8/h. ? ? ?Review of Systems ?As per HPI ? ?   ?Objective:  ? Physical Exam ? ?Vitals:  ? 09/10/21 1338  ?BP: 124/86  ?Pulse: (!) 105  ?Temp: 97.9 ?F (36.6 ?C)  ?TempSrc: Oral  ?SpO2: 97%  ?Weight: (!) 326 lb (147.9 kg)  ?Height: 5\' 4"  (1.626 m)  ? ?Gen: Pleasant, obese woman, in no distress,  normal affect ? ?ENT: No lesions,  mouth clear,  oropharynx clear, no postnasal drip ? ?Neck: No JVD, no stridor ? ?Lungs: No use of accessory muscles, no crackles or wheezing on normal respiration, no wheeze on forced expiration ? ?Cardiovascular: RRR, heart sounds normal, no murmur or gallops, no peripheral edema ? ?Musculoskeletal: No deformities, no cyanosis or clubbing ? ?Neuro: alert, awake, non focal ? ?Skin: Warm, no lesions or rash ? ?   ?Assessment & Plan:  ?Asthma ?She is  complaining of lung burning in absence of any wheezing on exam.  Hard for me to correlate this with an acute flare of her obstructive lung disease although she has seen improvement in similar symptoms in the past on prednisone.  I think some of her chest discomfort is due to cough and effect on respiratory muscles.  We are trying to optimize sinus, rhinitis treatment to minimize her cough.  For now we  can continue the [place].  I will treat her with a prednisone taper as requested.  We will attempt to optimize the contributing factors to her cough and obstructive lung disease.  No evidence for an active infection at this time.  She is on Augmentin already, being treated for a dog bite. ? ?Allergic rhinitis ?Please continue your Allegra, Singulair, Nasonex nasal spray and Astelin nasal spray as you have been using them. ?Continue to use Mucinex D if needed ?Continue your nasal saline irrigations ?Follow with ENT as planned ?Follow with APP or Dr Delton Coombes in 2 months or sooner if you have any problems. ? ?GERD (gastroesophageal reflux disease) ?Continue omeprazole 20 mg twice a day.  We may decide to increase this dose at some point ? ?Obstructive sleep apnea ?Mild OSA noted on her split-night sleep study.  Significantly worse during REM sleep.  She is willing to try CPAP and we will order for her, auto titration 8-20 cmH2O, best fit mask, heated humidity ? ?Time spent 40 minutes ? ?Levy Pupa, MD, PhD ?09/10/2021, 2:04 PM ?[facility] ?[phone] or if no answer [phone] ? ?

## 2021-09-10 NOTE — Assessment & Plan Note (Signed)
Mild OSA noted on her split-night sleep study.  Significantly worse during REM sleep.  She is willing to try CPAP and we will order for her, auto titration 8-20 cmH2O, best fit mask, heated humidity ?

## 2021-09-10 NOTE — Assessment & Plan Note (Signed)
Please continue your Allegra, Singulair, Nasonex nasal spray and Astelin nasal spray as you have been using them. ?Continue to use Mucinex D if needed ?Continue your nasal saline irrigations ?Follow with ENT as planned ?Follow with APP or Dr Lamonte Sakai in 2 months or sooner if you have any problems. ?

## 2021-09-10 NOTE — Patient Instructions (Addendum)
Please take prednisone 30 mg daily for 3 days, then 20 mg daily for 3 days, then 10 mg daily for 3 days, then stop. ?Finish the Augmentin that you were given in the emergency department ?Please continue Breztri 2 puffs twice a day, rinse and gargle after using. ?Keep albuterol available to use 2 puffs if needed for shortness of breath, chest tightness, wheezing. ?Please continue your Allegra, Singulair, Nasonex nasal spray and Astelin nasal spray as you have been using them. ?Continue to use Mucinex D if needed ?Continue your nasal saline irrigations ?Continue omeprazole 20 mg twice a day.  We may decide to increase this dose at some point going forward depending on how your cough and chest symptoms are doing. ?We will start auto titration CPAP.  We will send an order for you today.  There may be a waiting list to get the device. ?Follow with ENT as planned ?Follow with APP or Dr Delton Coombes in 2 months or sooner if you have any problems. ? ?

## 2021-09-10 NOTE — Addendum Note (Signed)
Addended by: Dorisann Frames R on: 09/10/2021 02:15 PM ? ? Modules accepted: Orders ? ?

## 2021-09-10 NOTE — Assessment & Plan Note (Signed)
She is complaining of lung burning in absence of any wheezing on exam.  Hard for me to correlate this with an acute flare of her obstructive lung disease although she has seen improvement in similar symptoms in the past on prednisone.  I think some of her chest discomfort is due to cough and effect on respiratory muscles.  We are trying to optimize sinus, rhinitis treatment to minimize her cough.  For now we can continue the [place].  I will treat her with a prednisone taper as requested.  We will attempt to optimize the contributing factors to her cough and obstructive lung disease.  No evidence for an active infection at this time.  She is on Augmentin already, being treated for a dog bite. ?

## 2021-09-10 NOTE — Assessment & Plan Note (Signed)
Continue omeprazole 20 mg twice a day.  We may decide to increase this dose at some point ?

## 2021-09-28 ENCOUNTER — Ambulatory Visit: Payer: BC Managed Care – PPO | Admitting: Emergency Medicine

## 2021-10-10 ENCOUNTER — Other Ambulatory Visit: Payer: Self-pay | Admitting: Nurse Practitioner

## 2021-10-10 DIAGNOSIS — J0111 Acute recurrent frontal sinusitis: Secondary | ICD-10-CM

## 2021-10-11 ENCOUNTER — Other Ambulatory Visit: Payer: Self-pay | Admitting: Psychiatry

## 2021-10-11 DIAGNOSIS — F5105 Insomnia due to other mental disorder: Secondary | ICD-10-CM

## 2021-11-15 ENCOUNTER — Emergency Department (HOSPITAL_BASED_OUTPATIENT_CLINIC_OR_DEPARTMENT_OTHER)
Admission: EM | Admit: 2021-11-15 | Discharge: 2021-11-15 | Disposition: A | Discharge status: Home / Self Care | Payer: BC Managed Care – PPO | Attending: Emergency Medicine | Admitting: Emergency Medicine

## 2021-11-15 ENCOUNTER — Encounter (HOSPITAL_BASED_OUTPATIENT_CLINIC_OR_DEPARTMENT_OTHER): Payer: Self-pay | Admitting: Emergency Medicine

## 2021-11-15 ENCOUNTER — Other Ambulatory Visit: Payer: Self-pay

## 2021-11-15 DIAGNOSIS — S00501A Unspecified superficial injury of lip, initial encounter: Secondary | ICD-10-CM | POA: Diagnosis present

## 2021-11-15 DIAGNOSIS — Z79899 Other long term (current) drug therapy: Secondary | ICD-10-CM | POA: Insufficient documentation

## 2021-11-15 DIAGNOSIS — X58XXXA Exposure to other specified factors, initial encounter: Secondary | ICD-10-CM | POA: Insufficient documentation

## 2021-11-15 DIAGNOSIS — S01511A Laceration without foreign body of lip, initial encounter: Secondary | ICD-10-CM

## 2021-11-15 MED ORDER — LIDOCAINE-EPINEPHRINE (PF) 2 %-1:200000 IJ SOLN
10.0000 mL | Freq: Once | INTRAMUSCULAR | Status: AC
  Administered 2021-11-15: 10 mL
  Filled 2021-11-15: qty 20

## 2021-11-15 MED ORDER — BACITRACIN ZINC 500 UNIT/GM EX OINT: TOPICAL_OINTMENT | Freq: Two times a day (BID) | CUTANEOUS | Status: DC

## 2021-11-15 MED ORDER — LIDOCAINE 4 % EX CREA
TOPICAL_CREAM | Freq: Once | CUTANEOUS | Status: AC
Start: 2021-11-15 — End: 2021-11-15
  Filled 2021-11-15: qty 5

## 2021-11-15 MED ORDER — BACITRACIN ZINC 500 UNIT/GM EX OINT: 1.0000 "application " | TOPICAL_OINTMENT | Freq: Two times a day (BID) | CUTANEOUS | 0 refills | Status: DC

## 2021-11-15 NOTE — Discharge Instructions (Signed)
You had a laceration to your lip that was repaired in the ER. ? ?Dissolvable sutures were placed.  If they do not dissolve within 5-7 days, please come to an urgent care or ER to get the sutures removed. ? ?Apply bacitracin ointment to the wound twice a day.  Keep the area clean and dry. ? ?Additionally,  ?Eat soft foods for two to three days. ? ??Rinse the mouth with water after eating. ? ??Avoid spicy or salty foods until the wound is healed. ? ??Avoid the use of straws (negative pressure may increase ecchymosis or bleeding at the wound site). ?

## 2021-11-15 NOTE — ED Provider Notes (Signed)
?[facility] EMERGENCY DEPARTMENT ?Provider Note ? ? ?CSN: [id number] ?Arrival date & time: 11/15/21  6629 ? ?  ? ?History ? ?Chief Complaint  ?Patient presents with  ? Facial Laceration  ? ? ?Carolyn Castro is a 41 y.o. female. ? ?HPI ? ?  ? ?42 year old female comes in with chief complaint of lip laceration. ? ?Patient indicates that she woke up from her sleep with bleeding and she must have bit her tongue.  She has no history of seizures.  She denies any trauma.  She is up-to-date with her tetanus status.  ? ?Denies pain or injury elsewhere. ? ?Home Medications ?Prior to Admission medications   ?Medication Sig Start Date End Date Taking? Authorizing Provider  ?bacitracin ointment Apply 1 application. topically 2 (two) times daily. 11/15/21  Yes Derwood Kaplan, MD  ?ALPRAZolam Prudy Feeler) 1 MG tablet Take 1 tablet (1 mg total) by mouth 2 (two) times daily as needed for anxiety. 08/23/21   Cottle, Steva Ready., MD  ?amLODipine (NORVASC) 10 MG tablet Take 10 mg by mouth daily.    [provider]  ?azelastine (ASTELIN) 0.1 % nasal spray INSTILL 2 SPRAYS INTO BOTH NOSTRILS TWICE DAILY 10/11/21   Allison Quarry, Ruby Cola, NP  ?Budeson-Glycopyrrol-Formoterol (BREZTRI AEROSPHERE) 160-9-4.8 MCG/ACT AERO Inhale 2 puffs into the lungs in the morning and at bedtime. 08/03/21   Cobb, Ruby Cola, NP  ?CRANBERRY PO Take 1 capsule by mouth in the morning and at bedtime.    [provider]  ?cyclobenzaprine (FLEXERIL) 10 MG tablet Take 1 tablet (10 mg total) by mouth 2 (two) times daily as needed for muscle spasms. 08/26/19   Luevenia Maxin, Mina A, PA-C  ?fexofenadine (ALLEGRA) 180 MG tablet Take 180 mg by mouth daily.    [provider]  ?levothyroxine (SYNTHROID) 75 MCG tablet Take 75 mcg by mouth daily before breakfast.    [provider]  ?montelukast (SINGULAIR) 10 MG tablet Take 10 mg by mouth at bedtime.    [provider]  ?Multiple Vitamins-Minerals (MULTIVITAMIN WOMEN) TABS Take 1 tablet  by mouth daily.    [provider]  ?norethindrone-ethinyl estradiol (LOESTRIN) 1-20 MG-MCG tablet Take 1 tablet by mouth daily. 03/29/21   [provider]  ?omeprazole (PRILOSEC) 20 MG capsule Take 20 mg by mouth 2 (two) times daily. 05/10/21   [provider]  ?ondansetron (ZOFRAN) 4 MG tablet Take 4 mg by mouth every 8 (eight) hours as needed for nausea or vomiting.    [provider]  ?PARoxetine (PAXIL) 30 MG tablet Take 2 tablets (60 mg total) by mouth at bedtime. 08/23/21   Cottle, Steva Ready., MD  ?predniSONE (DELTASONE) 10 MG tablet Take 4 tablets X 3 days, 3 tabs X 3 days, 2 tabs x 3 days, 1 tab x 3 days 09/10/21   Leslye Peer, MD  ?Probiotic Product (PROBIOTIC PO) Take 1 capsule by mouth daily at 12 noon.    [provider]  ?traZODone (DESYREL) 50 MG tablet TAKE 2 TABLETS(100 MG) BY MOUTH AT BEDTIME. 10/11/21   Cottle, Steva Ready., MD  ?   ? ?Allergies    ?Ciprofloxacin hcl, Fluticasone propionate, and Triamcinolone acetonide   ? ?Review of Systems   ?Review of Systems ? ?Physical Exam ?Updated Vital Signs ?BP 139/82 (BP Location: Right Arm)   Pulse (!) 115   Temp 98.1 ?F (36.7 ?C) (Oral)   Resp 20   SpO2 100%  ?Physical Exam ?Vitals and nursing note reviewed.  ?  Constitutional:   ?   Appearance: She is well-developed.  ?HENT:  ?   Head: Atraumatic.  ?   Mouth/Throat:  ? ?   Comments: Patient has irregular, stellate type laceration over the lower left lip.  The laceration is a flap type laceration and it crosses the vermilion border twice ?Cardiovascular:  ?   Rate and Rhythm: Normal rate.  ?Pulmonary:  ?   Effort: Pulmonary effort is normal.  ?Musculoskeletal:  ?   Cervical back: Normal range of motion and neck supple.  ?Skin: ?   General: Skin is warm and dry.  ?Neurological:  ?   Mental Status: She is alert and oriented to person, place, and time.  ? ? ?ED Results / Procedures / Treatments   ?Labs ?(all labs ordered are listed, but only abnormal results  are displayed) ?Labs Reviewed - No data to display ? ?EKG ?None ? ?Radiology ?No results found. ? ?Procedures ?.Nerve Block ? ?Date/Time: 11/15/2021 8:43 AM ?Performed by: Derwood Kaplan, MD ?Authorized by: Derwood Kaplan, MD  ? ?Consent:  ?  Consent obtained:  Written ?  Consent given by:  Patient ?  Risks discussed:  Infection, allergic reaction, nerve damage, swelling, unsuccessful block, pain and bleeding ?Universal protocol:  ?  Procedure explained and questions answered to patient or proxy's satisfaction: yes   ?  Relevant documents present and verified: yes   ?  Test results available: yes   ?  Imaging studies available: yes   ?  Required blood products, implants, devices, and special equipment available: yes   ?  Site/side marked: yes   ?  Immediately prior to procedure, a time out was called: yes   ?  Patient identity confirmed:  Arm band ?Indications:  ?  Indications:  Procedural anesthesia ?Location:  ?  Body area:  Head ?  Head nerve blocked: Mouth. ?  Laterality:  Left ?Pre-procedure details:  ?  Preparation: Patient was prepped and draped in usual sterile fashion   ?Skin anesthesia:  ?  Skin anesthesia method:  Topical application ?  Topical anesthetic:  Lidocaine gel ?Procedure details:  ?  Block needle gauge:  27 G ?  Anesthetic injected:  Lidocaine 2% WITH epi ?  Injection procedure:  Anatomic landmarks identified and anatomic landmarks palpated ?Post-procedure details:  ?  Dressing:  None ?  Outcome:  Anesthesia achieved ?  Procedure completion:  Tolerated well, no immediate complications ?Comments:  ?   Patient received mental nerve block ?Marland Kitchen.Laceration Repair ? ?Date/Time: 11/15/2021 8:45 AM ?Performed by: Derwood Kaplan, MD ?Authorized by: Derwood Kaplan, MD  ? ?Consent:  ?  Consent obtained:  Written ?  Consent given by:  Patient ?  Risks discussed:  Infection, pain, poor cosmetic result, poor wound healing, need for additional repair and retained foreign body ?Universal protocol:  ?   Procedure explained and questions answered to patient or proxy's satisfaction: yes   ?  Relevant documents present and verified: yes   ?  Test results available: yes   ?  Imaging studies available: yes   ?  Required blood products, implants, devices, and special equipment available: yes   ?  Site/side marked: yes   ?  Immediately prior to procedure, a time out was called: yes   ?  Patient identity confirmed:  Arm band ?Anesthesia:  ?  Anesthesia method:  Nerve block ?Laceration details:  ?  Location:  Lip ?  Lip location:  Lower exterior lip ?  Length (cm):  2.5 ?  Depth (mm):  3 ?Pre-procedure details:  ?  Preparation:  Patient was prepped and draped in usual sterile fashion ?Exploration:  ?  Imaging outcome: foreign body not noted   ?  Wound exploration: wound explored through full range of motion and entire depth of wound visualized   ?  Wound extent: areolar tissue violated   ?Treatment:  ?  Area cleansed with:  Saline ?  Amount of cleaning:  Standard ?  Irrigation solution:  Tap water and sterile water ?  Irrigation volume:  50 ?  Irrigation method:  Syringe and pressure wash ?  Debridement:  None ?  Undermining:  None ?  Scar revision: no   ?Skin repair:  ?  Repair method:  Sutures ?  Suture size:  5-0 ?  Suture material:  Chromic gut ?  Suture technique:  Simple interrupted ?  Number of sutures:  5 ?Approximation:  ?  Approximation:  Close ?  Vermilion border well-aligned: yes   ?Repair type:  ?  Repair type:  Complex ?Post-procedure details:  ?  Dressing:  Antibiotic ointment ?  Procedure completion:  Tolerated well, no immediate complications ?Comments:  ?   " Stay" stitch was applied to allow for wound over vermilion border to proximate better  ? ? ?Medications Ordered in ED ?Medications  ?lidocaine-EPINEPHrine (XYLOCAINE W/EPI) 2 %-1:200000 (PF) injection 10 mL (has no administration in time range)  ?lidocaine (LMX) 4 % cream (has no administration in time range)  ?bacitracin ointment (has no  administration in time range)  ? ? ?ED Course/ Medical Decision Making/ A&P ?  ?                        ?Medical Decision Making ?Risk ?OTC drugs. ?Prescription drug management. ? ? ?41 year old female comes in with chief co

## 2021-11-15 NOTE — ED Triage Notes (Signed)
Bit her lower lip in her sleep this am, UTD tetanus bleeding controlled at this time ?

## 2021-11-18 ENCOUNTER — Ambulatory Visit (INDEPENDENT_AMBULATORY_CARE_PROVIDER_SITE_OTHER): Payer: BC Managed Care – PPO | Admitting: Emergency Medicine

## 2021-11-18 ENCOUNTER — Encounter: Payer: Self-pay | Admitting: Emergency Medicine

## 2021-11-18 DIAGNOSIS — K219 Gastro-esophageal reflux disease without esophagitis: Secondary | ICD-10-CM | POA: Diagnosis not present

## 2021-11-18 DIAGNOSIS — J4541 Moderate persistent asthma with (acute) exacerbation: Secondary | ICD-10-CM

## 2021-11-18 DIAGNOSIS — J301 Allergic rhinitis due to pollen: Secondary | ICD-10-CM | POA: Diagnosis not present

## 2021-11-18 DIAGNOSIS — G4733 Obstructive sleep apnea (adult) (pediatric): Secondary | ICD-10-CM

## 2021-11-18 NOTE — Patient Instructions (Addendum)
Please continue Breztri 2 puffs twice a day.  Rinse and gargle after using. Keep your albuterol available to use 2 puffs when needed for shortness of breath, chest tightness, wheezing. Please continue Allegra once daily Continue Singulair 10 mg each evening. Continue your Nasonex and Astelin nasal sprays as you have been taking them. Continue your nasal saline irrigation. Continue omeprazole 40 mg twice a day.  Take this medication 1 hour around food. We will refer you back to Allergy to repeat your allergy skin testing and consider possible usefulness of immunotherapy (allergy shots) Continue to wear your CPAP every night Follow with Dr Delton Coombes in 6 months or sooner if you have any problems

## 2021-11-18 NOTE — Addendum Note (Signed)
Addended by: Dorisann Frames R on: 11/18/2021 04:46 PM   Modules accepted: Orders

## 2021-11-18 NOTE — Progress Notes (Signed)
Subjective:    Patient ID: Carolyn Castro, female    DOB: Jun 24, 1981, 41 y.o.   MRN: [id number]  HPI  ROV 01/14/21 --follow-up visit 41 year old woman with history of asthma and moderate obstruction by spirometry, also restrictive disease due to obesity.  She has allergic rhinitis and GERD with upper airway irritation and cough.  She has chronic nocturnal hypoxemia. At her last visit she mentioned periods of fogginess, ? Pre-syncope  Split-night sleep study was done 01/12/2021.  The final report is not yet available but on my interpretation it does not look like she had clinically significant OSA.  She did have nocturnal desaturations (which we have previously documented)  She has continued to have some periods of slow responsiveness, cloudy thinking, especially in the mornings. Her breathing is , remains on symbicort, singulair, allegra. Less UA irritation currently. She has xanax, trazadone, paxil, on her med list that could cause side effects above.    Acute OV 09/10/21 --acute office visit for 41 year old woman followed for moderate persistent asthma and restrictive disease due to obesity.  Also with chronic upper airway irritation and cough in the setting of allergic rhinitis, GERD.  She had COVID-19 in late January, was treated with molnupiravir.  She had been treated for sinus symptoms and possible sinusitis with doxycycline and Augmentin prior to this.  A CT scan of her chest done in the emergency department 07/29/2021 for associated shortness of breath no evidence of pulmonary embolism, left upper lobe and lingular opacity, groundglass in the right middle lobe consistent with COVID-19 pneumonitis versus bacterial pneumonia.  She was seen 1/31 after being treated with azithromycin and a prednisone taper in the ED, completed these.  Her Symbicort was changed to [employer].  Continued on Allegra, Singulair, Nasonex, Astelin nasal spray, nasal saline irrigations and Mucinex D.  She was recently  started back on Augmentin in the ED after she had a dog bite 09/05/2021 She reports today that she is having lower chest wall and costal margin pain, chest "burning", she has had increased exertional dyspnea. She has had frequent dry cough. Not currently on prednisone. She has an OV scheduled with ENT in April. She believes that the Judithann Sauger is about the same as the Symbicort.   Repeat CT scan of the chest 08/31/2021 reviewed by me shows resolution of the lingular infiltrate and right basilar groundglass.  Small amount of left upper lobe linear atelectasis.  There is a stable 5 mm pleural-based noncalcified right middle lobe nodule.  Sleep study 01/12/2021 reviewed showed AHI 8.4/h with 57 hypopneas, lowest SPO2 83%.  The AHI during REM sleep was 44.8/h.   ROV 11/18/2021 --41 year old woman with history of obesity whom I have followed for upper and lower airways disease.  She has moderate persistent asthma, chronic upper airway irritation with cough.  She has GERD and allergic rhinitis.  She had some lingular and right-sided groundglass infiltrate following COVID-19 in January 2023.  Stable 5 mm noncalcified pleural-based nodule in the right middle lobe also noted. She has mild obstructive sleep apnea Here in March for an acute visit when she was complaining of "chest burning" in absence of any wheezing.  I treated her with prednisone per her request although unclear to me whether this represented bronchospasm, may have been related to her persistent cough.  Continued breztri, Allegra, Singulair, Nasonex and Astelin, nasal irrigation, omeprazole 20 mg twice daily.  She was to follow with ENT.  Finally I started her on auto titration CPAP for OSA  noted on split-night sleep study. She reports that she has been wearing CPAP effectively. She notes that it has helped her wakefulness and energy during the day. Her cough is better. She is using albuterol 2x a week.    Review of Systems As per HPI     Objective:    Physical Exam  Vitals:   11/18/21 1610  BP: 138/76  Pulse: (!) 102  Temp: 97.6 F (36.4 C)  TempSrc: Oral  SpO2: 94%  Weight: (!) 331 lb 9.6 oz (150.4 kg)  Height: 5\' 4"  (1.626 m)   Gen: Pleasant, obese woman, in no distress,  normal affect  ENT: No lesions,  mouth clear,  oropharynx clear, no postnasal drip, some hoarse voice  Neck: No JVD, no stridor  Lungs: No use of accessory muscles, no crackles or wheezing on normal respiration, no wheeze on forced expiration  Cardiovascular: RRR, heart sounds normal, no murmur or gallops, no peripheral edema  Musculoskeletal: No deformities, no cyanosis or clubbing  Neuro: alert, awake, non focal  Skin: Warm, no lesions or rash     Assessment & Plan:  Obstructive sleep apnea She is tolerating her CPAP, good compliance and does feel some clinical benefit.  We will plan to continue.  Asthma Please continue Breztri 2 puffs twice a day.  Rinse and gargle after using. Keep your albuterol available to use 2 puffs when needed for shortness of breath, chest tightness, wheezing. Follow with Dr Lamonte Sakai in 6 months or sooner if you have any problems  Allergic rhinitis Please continue Allegra once daily Continue Singulair 10 mg each evening. Continue your Nasonex and Astelin nasal sprays as you have been taking them. Continue your nasal saline irrigation. We will refer you back to Allergy to repeat your allergy skin testing and consider possible usefulness of immunotherapy (allergy shots)   GERD (gastroesophageal reflux disease) Continue omeprazole 40 mg twice a day.  Take this medication 1 hour around food.    Baltazar Apo, MD, PhD 11/18/2021, 4:43 PM [facility] [phone] or if no answer [phone]

## 2021-11-18 NOTE — Assessment & Plan Note (Signed)
Please continue Breztri 2 puffs twice a day.  Rinse and gargle after using. Keep your albuterol available to use 2 puffs when needed for shortness of breath, chest tightness, wheezing. Follow with Dr Delton Coombes in 6 months or sooner if you have any problems

## 2021-11-18 NOTE — Assessment & Plan Note (Signed)
She is tolerating her CPAP, good compliance and does feel some clinical benefit.  We will plan to continue.

## 2021-11-18 NOTE — Assessment & Plan Note (Signed)
Please continue Allegra once daily Continue Singulair 10 mg each evening. Continue your Nasonex and Astelin nasal sprays as you have been taking them. Continue your nasal saline irrigation. We will refer you back to Allergy to repeat your allergy skin testing and consider possible usefulness of immunotherapy (allergy shots)

## 2021-11-18 NOTE — Assessment & Plan Note (Signed)
Continue omeprazole 40 mg twice a day.  Take this medication 1 hour around food.

## 2022-01-03 ENCOUNTER — Other Ambulatory Visit: Payer: Self-pay | Admitting: Gastroenterology

## 2022-01-11 ENCOUNTER — Other Ambulatory Visit: Payer: Self-pay | Admitting: Psychiatry

## 2022-01-11 DIAGNOSIS — F4001 Agoraphobia with panic disorder: Secondary | ICD-10-CM

## 2022-02-07 ENCOUNTER — Encounter: Payer: Self-pay | Admitting: Psychiatry

## 2022-02-07 ENCOUNTER — Ambulatory Visit (INDEPENDENT_AMBULATORY_CARE_PROVIDER_SITE_OTHER): Payer: BC Managed Care – PPO | Admitting: Psychiatry

## 2022-02-07 DIAGNOSIS — F5105 Insomnia due to other mental disorder: Secondary | ICD-10-CM | POA: Diagnosis not present

## 2022-02-07 DIAGNOSIS — F4001 Agoraphobia with panic disorder: Secondary | ICD-10-CM | POA: Diagnosis not present

## 2022-02-07 DIAGNOSIS — F325 Major depressive disorder, single episode, in full remission: Secondary | ICD-10-CM

## 2022-02-07 MED ORDER — ALPRAZOLAM 1 MG PO TABS: 1.0000 mg | ORAL_TABLET | Freq: Two times a day (BID) | ORAL | 0 refills | Status: DC | PRN

## 2022-02-07 MED ORDER — PAROXETINE HCL 30 MG PO TABS: 60.0000 mg | ORAL_TABLET | Freq: Every day | ORAL | 1 refills | Status: DC

## 2022-02-07 NOTE — Progress Notes (Signed)
LARAMIE GELLES [id number] 04-02-1981 41 y.o.    Subjective:   Patient ID:  Carolyn Castro is a 41 y.o. (DOB [birth year]) female.  Chief Complaint:  Chief Complaint  Patient presents with   Follow-up    Panic disorder with agoraphobia   Anxiety   Sleeping Problem    HPI Carolyn Castro presents to the office today for follow-up of panic disorder and depression.    When seen September 26, 2017 she had previously been switched from sertraline to paroxetine 40 mg for panic attacks.  She had a good response until a recent bout of health problems had noticed a resurgence of panic.  We decided to increase paroxetine to greater than the usual 60 mg daily from 40 mg daily on August 23, 2018.    When seen May 2020.Health gotten better and anxiety has come down.  Increase in paroxetine has helped her also.  No med changes were made.  She called back in June 2020 saying that her anxiety and insomnia was worse and she was having to take steroids for asthma.  She was allowed to increase Xanax to 0.5 mg #5 daily.  Encouraged to reduce the dose as soon as possible.  It was discovered that she was also getting Xanax from another provider and she was cautioned about that as being inappropriate. Subsequently she is gotten Xanax from another provider on more than one occasion. She called April 09, 2019 in the catering her anxiety was worse.  She was worked in to an appointment today.  seen April 12, 2019.  She accidentally reduced the dosage a couple of months prior and  and now the anxiety is out of hand again. Therefore  Increase the paroxetine to greater than usual dose at 60 to try to limit the need of Bz.  Increase in paroxetine was previously effective at the higher dose.[place] PDMP shows she is getting Xanax 1 mg quantity 30 from PCP, Dr. Shirlean Mylar and oxycodone 5 mg tablets and quantities of 40-60 from [facility].   She last filled a Xanax prescription from our office in March 2020.   Says she's stopped pain meds.   Last fill date 8/27. 3 ER visits since she was last here for shortness of breath  seen December 2020 & March 2021.  No meds were changed. Increased paroxetine to 60 and it's helped.     02/03/20 appt with the following noted: Consistent with paroxetine 60 since here. Xanax 0.5 mg about twice weekly. Anxiety higher lately.  2 PT jobs including dog sitting.  One of dogs got away and attacked another dog with vet bill.  She had to fork out $1000 to help pay for it.  Realizes she shouldn't have done it.  Got real frutstrated with herself for giving in to their demands. Has done therapy online about twice weekly who is good. Gets numb from stress.  GM died naturally hard.  $ stress.  Louann Sjogren can be demeaning and demanding.  Wants to do international missions but can't DT Covid. Last 2 mos hard bc no recent vacation Dt $ stress as noted with medical bills.  Drained. Sleep well with trazodone. Panic better with paroxetine but not completely stopped but shorter and less severe. Overall really good with panic and using Xanax about 1-2 times weeklly.    Overall anxiety still better with Paxil.  No problems with it.  Likes living at home to get support from parents over her health issues.  Better handling stress and less panic.  Mostly back to normal.  Likes Paxil better than Zoloft.  Not as sig depressive spells either.  Conc and appetite and function is fair.   Early November had pneumonia and otherwise function is good.   Sleeps better with  trazodone.  8-9 hours. Plan continue paroxetine 60 mg daily which was helpful.  No med changes  07/22/2020 appointment with the following noted: Change jobs TA with schools.  At times worries about job performance.  Overall things going well.  Good coworkers.  Since August.  Some worry over saving $ for the summer.  Can get panicky if overthinks that.  Xanax helps.  Anxiety can get triggered at work too excessively.  Some hypervigilance  and startle. Hx severe asthma Plan: continue paroxetine 60, Xanax 1 mg ana d trazodone 100  01/12/2021 appointment with the following noted: Parents got Covid a few weeks ago and recovered except some fatigue. She has not had it. Anxiety comes and goes. Started counseling [place] to deal with stressors and and anxiety and it's helped. Less need for Xanax.. Trazodone works without hangover.   In a month half of the days are anxious. Can see people that are stressful and trigger anxiety.  Not much avoidance with counseling help. Fell asleep driving recently and cog problems and pending sleep study in the lab toninght.  Not using CPAP but using o2. No med changes    04/19/21 appt noted: A lot going on.  Labor day Covid with sig sx.  Having to use more Xanax; about 1 daily 3 days per week.  It messed with her mind bc it drug on and started getting paranoid about her health. GF MI at church. Had a sent. Dad felll a few days later with arm fx and pending surgery.  Hyped up about him.  Worries over dad's health and super close to him. Able to sleep ok.  Work is fine with long hours. No SE.  Plan no med changes.  08/23/2021 appointment with the following noted: She continues paroxetine 60 mg daily, Xanax 1 mg twice daily, and trazodone 50 to 100 mg nightly Taking Xanax daily. Recent pneumonia with hospitalization and then Covid triggered anxiety over breathing so still taking Xanax to daily with this but easing up this last week.  Severe asthma Recent second Covid less severe than past. Recurring anxiety and panic problems but getting better. OK with depression.  Continues counseling has been helpful. 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 No Se and not sedated specifically. Still working and doing fine there.  02/07/22 appt noted: Good with p[axil. 1-2 panic per month and takes X anax and it helps.   Depression periods can come and go including 3-4 weeks in April without  reason. Xanax avg 1 mg weekly. Some insomnia incl EMA with racing thoughts but total of 7-8 hours. Still going to therapy and it helped. No SE  Had low back surgery end of June 2020 helped.     Past psych meds:  Zoloft 200 NR & SE, , paxil 60,  Xanax 1 mg BID ., hydroxyzine, trazodone 100 helped. Hx severe asthma so no propranolol  Review of Systems:  Review of Systems  HENT:  Positive for voice change.   Respiratory:  Positive for shortness of breath and wheezing. Negative for chest tightness.   Gastrointestinal:  Negative for abdominal pain.  Musculoskeletal:  Positive for arthralgias and back pain.  Neurological:  Negative for dizziness and tremors.  Psychiatric/Behavioral:  Negative for agitation, behavioral problems, confusion, decreased concentration, hallucinations, self-injury, sleep disturbance and suicidal ideas. The patient is nervous/anxious. The patient is not hyperactive.   asthma about the same.  Medications: I have reviewed the patient's current medications.  Current Outpatient Medications  Medication Sig Dispense Refill   amLODipine (NORVASC) 10 MG tablet Take 10 mg by mouth daily.     azelastine (ASTELIN) 0.1 % nasal spray INSTILL 2 SPRAYS INTO BOTH NOSTRILS TWICE DAILY 30 mL 2   bacitracin ointment Apply 1 application. topically 2 (two) times daily. 14 g 0   Budeson-Glycopyrrol-Formoterol (BREZTRI AEROSPHERE) 160-9-4.8 MCG/ACT AERO Inhale 2 puffs into the lungs in the morning and at bedtime. 10.7 g 3   CRANBERRY PO Take 1 capsule by mouth in the morning and at bedtime.     cyclobenzaprine (FLEXERIL) 10 MG tablet Take 1 tablet (10 mg total) by mouth 2 (two) times daily as needed for muscle spasms. 10 tablet 0   fexofenadine (ALLEGRA) 180 MG tablet Take 180 mg by mouth daily.     levothyroxine (SYNTHROID) 75 MCG tablet Take 75 mcg by mouth daily before breakfast.     montelukast (SINGULAIR) 10 MG tablet Take 10 mg by mouth at bedtime.     Multiple  Vitamins-Minerals (MULTIVITAMIN WOMEN) TABS Take 1 tablet by mouth daily.     norethindrone-ethinyl estradiol (LOESTRIN) 1-20 MG-MCG tablet Take 1 tablet by mouth daily.     omeprazole (PRILOSEC) 20 MG capsule Take 20 mg by mouth 2 (two) times daily.     ondansetron (ZOFRAN) 4 MG tablet Take 4 mg by mouth every 8 (eight) hours as needed for nausea or vomiting.     predniSONE (DELTASONE) 10 MG tablet Take 4 tablets X 3 days, 3 tabs X 3 days, 2 tabs x 3 days, 1 tab x 3 days 30 tablet 0   Probiotic Product (PROBIOTIC PO) Take 1 capsule by mouth daily at 12 noon.     traZODone (DESYREL) 50 MG tablet TAKE 2 TABLETS(100 MG) BY MOUTH AT BEDTIME. 180 tablet 1   ALPRAZolam (XANAX) 1 MG tablet Take 1 tablet (1 mg total) by mouth 2 (two) times daily as needed for anxiety. 30 tablet 0   PARoxetine (PAXIL) 30 MG tablet Take 2 tablets (60 mg total) by mouth at bedtime. 180 tablet 1   No current facility-administered medications for this visit.    Medication Side Effects: None  Allergies:  Allergies  Allergen Reactions   Ciprofloxacin Hcl Diarrhea and Other (See Comments)    Developed C-Diff   Fluticasone Propionate     Other reaction(s): Headaches   Triamcinolone Acetonide     Other reaction(s): rash on face    Past Medical History:  Diagnosis Date   Anxiety    Asthma    Back pain    Constipation    Fatigue    Hypertension    Hypothyroid    Hypothyroidism     Family History  Problem Relation Age of Onset   Hypertension Mother    Hypertension Father    Adrenal disorder Father        uncertain type    Social History   Socioeconomic History   Marital status: Single    Spouse name: Not on file   Number of children: 0   Years of education: 16   Highest education level: Not on file  Occupational History   Not on file  Tobacco Use   Smoking status: Never   Smokeless tobacco:  Never  Vaping Use   Vaping Use: Never used  Substance and Sexual Activity   Alcohol use: Never   Drug  use: Never   Sexual activity: Not Currently    Birth control/protection: Pill  Other Topics Concern   Not on file  Social History Narrative   Left handed   One story home   Drinks caffeine   Social Determinants of Health   Financial Resource Strain: Not on file  Food Insecurity: Not on file  Transportation Needs: Not on file  Physical Activity: Not on file  Stress: Not on file  Social Connections: Not on file  Intimate Partner Violence: Not on file    Past Medical History, Surgical history, Social history, and Family history were reviewed and updated as appropriate.   Please see review of systems for further details on the patient's review from today.   Objective:   Physical Exam:  There were no vitals taken for this visit.  Physical Exam Constitutional:      General: She is not in acute distress.    Appearance: She is obese.  Neurological:     Mental Status: She is alert and oriented to person, place, and time.  Psychiatric:        Attention and Perception: She is attentive. She does not perceive auditory hallucinations.        Mood and Affect: Mood is anxious. Mood is not depressed. Affect is not labile, blunt, angry or inappropriate.        Speech: Speech normal. Speech is not slurred.        Behavior: Behavior normal. Behavior is not withdrawn.        Thought Content: Thought content normal. Thought content is not delusional. Thought content does not include homicidal or suicidal ideation. Thought content does not include suicidal plan.        Cognition and Memory: Cognition normal.        Judgment: Judgment normal.     Comments: Insight intact. Not depressed No auditory or visual hallucinations.  Anxiety chronic but not worse Talkative.     Lab Review:     Component Value Date/Time   NA 135 07/29/2021 0416   K 3.5 07/29/2021 0416   CL 103 07/29/2021 0416   CO2 21 (L) 07/29/2021 0416   GLUCOSE 117 (H) 07/29/2021 0416   BUN 9 07/29/2021 0416   CREATININE  0.96 07/29/2021 0416   CALCIUM 8.5 (L) 07/29/2021 0416   PROT 7.0 10/28/2020 0936   ALBUMIN 3.3 (L) 10/28/2020 0936   AST 34 10/28/2020 0936   ALT 40 10/28/2020 0936   ALKPHOS 69 10/28/2020 0936   BILITOT 0.5 10/28/2020 0936   GFRNONAA >60 07/29/2021 0416   GFRAA >60 12/26/2019 1440       Component Value Date/Time   WBC 11.2 (H) 07/30/2021 0119   RBC 4.31 07/30/2021 0119   HGB 12.6 07/30/2021 0119   HCT 38.0 07/30/2021 0119   PLT 353 07/30/2021 0119   MCV 88.2 07/30/2021 0119   MCH 29.2 07/30/2021 0119   MCHC 33.2 07/30/2021 0119   RDW 13.4 07/30/2021 0119   LYMPHSABS 1.6 10/28/2020 0936   MONOABS 0.5 10/28/2020 0936   EOSABS 0.1 10/28/2020 0936   BASOSABS 0.0 10/28/2020 0936    No results found for: "POCLITH", "LITHIUM"   No results found for: "PHENYTOIN", "PHENOBARB", "VALPROATE", "CBMZ"   .res Assessment: Plan:    Panic disorder with agoraphobia - Plan: ALPRAZolam (XANAX) 1 MG tablet, PARoxetine (PAXIL) 30 MG  tablet  Major depressive disorder with single episode, in full remission (HCC) - Plan: PARoxetine (PAXIL) 30 MG tablet  Insomnia due to mental condition   Greater than 50% of 30 min face to face time with patient was spent on counseling and coordination of care. We discussed History of severe panic disorder and mild to moderate major depression which did not respond to sertraline 200 mg a day.  Continue paroxetine to greater than usual dose at 60 to try to limit the need of Bz.  It has helped to partially manage the anxiety and mood.  Increase in paroxetine was effective at further reducing anxiety below the level she had at 40 mg a day Residual anxiety.  Consider even higher dose for TR anxiety but defer..  Disc SE   She remains easily stressed.  [place] with job change.  sleep study pending showed low O2 at night and on 02 now..  No OSA .Marland Kitchen  Disc importance of oxygen levels to brain and mental health.  We discussed the short-term risks associated with  benzodiazepines including sedation and increased fall risk among others.  Discussed long-term side effect risk including dependence, potential withdrawal symptoms, and the potential eventual dose-related risk of dementia.  Disc risk with opiates.  She is not planning on consistent opiates.  Disc risk of increasing the Xanax.  She's using sparingly. Continue Xanax 1 mg BID prn  For sleep continue trazodone  but ok to try 150 mg HS. It helps stop worry at night. Disc sleep hygiene and restriction.  Continue therapy if helpful. Going twice monthly  No med changes  Follow-up 4 -6 mos.  She agrees with the plan. No changes without calling.  Consider increase paroxetine.  Meredith Staggers MD, DFAPA  Please see After Visit Summary for patient specific instructions.  Future Appointments  Date Time Provider Department Center  03/04/2022  8:30 AM Marcelyn Bruins, MD [facility] None    No orders of the defined types were placed in this encounter.      -------------------------------

## 2022-02-17 ENCOUNTER — Other Ambulatory Visit: Payer: Self-pay

## 2022-02-17 ENCOUNTER — Telehealth: Payer: Self-pay | Admitting: Psychiatry

## 2022-02-17 DIAGNOSIS — F5105 Insomnia due to other mental disorder: Secondary | ICD-10-CM

## 2022-02-17 MED ORDER — TRAZODONE HCL 50 MG PO TABS: 150.0000 mg | ORAL_TABLET | Freq: Every day | ORAL | 1 refills | Status: DC

## 2022-02-17 NOTE — Telephone Encounter (Signed)
Rx sent 

## 2022-02-17 NOTE — Telephone Encounter (Signed)
Pt LVM @ 1:38p.  She said when she met with Dr. Clovis Pu he told her to increase her Trazadone by one pill.  That increase worked and she wanted to make sure when a refill is sent in to have it sent in at the higher dose.  Next appt 1/10

## 2022-02-21 ENCOUNTER — Other Ambulatory Visit (HOSPITAL_COMMUNITY): Payer: Self-pay

## 2022-02-21 ENCOUNTER — Other Ambulatory Visit (HOSPITAL_BASED_OUTPATIENT_CLINIC_OR_DEPARTMENT_OTHER): Payer: Self-pay

## 2022-02-21 MED ORDER — WEGOVY 0.25 MG/0.5ML ~~LOC~~ SOAJ
0.2500 mg | SUBCUTANEOUS | 0 refills | Status: DC
  Filled 2022-02-21 – 2022-02-22 (×3): qty 2, 28d supply, fill #0

## 2022-02-22 ENCOUNTER — Other Ambulatory Visit (HOSPITAL_COMMUNITY): Payer: Self-pay

## 2022-02-22 ENCOUNTER — Other Ambulatory Visit (HOSPITAL_BASED_OUTPATIENT_CLINIC_OR_DEPARTMENT_OTHER): Payer: Self-pay

## 2022-02-28 ENCOUNTER — Other Ambulatory Visit (HOSPITAL_COMMUNITY): Payer: Self-pay

## 2022-02-28 MED ORDER — GABAPENTIN 300 MG PO CAPS
300.0000 mg | ORAL_CAPSULE | Freq: Every evening | ORAL | 0 refills | Status: DC | PRN
  Filled 2022-02-28: qty 30, 30d supply, fill #0

## 2022-03-01 ENCOUNTER — Ambulatory Visit: Payer: Self-pay | Admitting: Allergy

## 2022-03-04 ENCOUNTER — Ambulatory Visit (INDEPENDENT_AMBULATORY_CARE_PROVIDER_SITE_OTHER): Payer: BC Managed Care – PPO | Admitting: Allergy

## 2022-03-04 ENCOUNTER — Encounter: Payer: Self-pay | Admitting: Allergy

## 2022-03-04 VITALS — BP 134/80 | HR 113 | Temp 98.5°F | Resp 16 | Ht 62.6 in | Wt 325.0 lb

## 2022-03-04 DIAGNOSIS — J4540 Moderate persistent asthma, uncomplicated: Secondary | ICD-10-CM | POA: Diagnosis not present

## 2022-03-04 DIAGNOSIS — H1013 Acute atopic conjunctivitis, bilateral: Secondary | ICD-10-CM

## 2022-03-04 DIAGNOSIS — J3089 Other allergic rhinitis: Secondary | ICD-10-CM | POA: Diagnosis not present

## 2022-03-04 MED ORDER — RYALTRIS 665-25 MCG/ACT NA SUSP: 2.0000 | Freq: Two times a day (BID) | NASAL | 5 refills | Status: DC

## 2022-03-04 NOTE — Patient Instructions (Signed)
-   Testing today showed: trees, dust mites, and cat. - Copy of test results provided.  - Avoidance measures provided. - Stop taking: Nasonex and Astelin for now --> replacing both with Ryaltris combo nose spray - Continue with: Allegra (fexofenadine) 180mg  tablet once daily and Singulair (montelukast) 10mg  daily. - Start taking: Ryaltris (olopatadine/mometasone) two sprays per nostril 2 times daily  for nasal congestion or drainage control.  - For allergy based eyedrops can try either Pataday, Alaway or Zaditor over-the-counter options.  - You can use an extra dose of the antihistamine, if needed, for breakthrough symptoms.  - Continue nasal saline rinses daily as needed to remove allergens from the nasal cavities as well as help with mucous clearance (this is especially helpful to do before the nasal sprays are given) - Consider allergy shots as a means of long-term control. - Allergy shots "re-train" and "reset" the immune system to ignore environmental allergens and decrease the resulting immune response to those allergens (sneezing, itchy watery eyes, runny nose, nasal congestion, etc).    - Allergy shots improve symptoms in 75-85% of patients.  - We can discuss more at the next appointment if the medications are not working for you.  - Lung function testing is normal - Daily controller medication(s): Singulair 10mg  daily and Breztri 2 puffs twice a day - Prior to physical activity: albuterol 2 puffs 10-15 minutes before physical activity. - Rescue medications: albuterol 2 puffs every 4-6 hours as needed  - Asthma control goals:  * Full participation in all desired activities (may need albuterol before activity) * Albuterol use two time or less a week on average (not counting use with activity) * Cough interfering with sleep two time or less a month * Oral steroids no more than once a year * No hospitalizations  Follow-up in 4-6 months or sooner if needed

## 2022-03-04 NOTE — Progress Notes (Signed)
New Patient Note  RE: Carolyn Castro MRN: [id number] DOB: May 06, 1981 Date of Office Visit: 03/04/2022  Primary care provider: Camie Patience, FNP  Chief Complaint: Allergies  History of present illness: Carolyn Castro is a 41 y.o. female presenting today for evaluation of allergic rhinitis.  She reports symptoms of watery eyes, sore throat, drainage with throat clearing, feels like fluid in the ears, frequent pressure.  Occasional sneezing. Symptoms are year-round.  She takes allegra daily for past 5-6 years.  She can tell a difference in symptoms sine being off allegra for this visit.   She takes nasonex 2 sprays nightly and does help with congestion.   She has astelin that she uses as needed and states would use when symptoms are worsening like when she has a sinus infection.  She has been on singulair as well for years.  She has tried some OTC eye drops but did not feel they were helpful however not sure which drops she has tried.  She has performed nasal saline flush and tolerates performing mostly when she has sinus infections. She states she typically 4-5 sinus infections a year treated with antibiotic and steroids.  She states her symptoms above worsen and include headaches, worse sinus pressure, nausea and sometimes fevers.  She states she had a bad sinus infection in December 2022 which didn't resolve with 2 rounds of antibiotics (augmentin and prednisone) and states required hospitalization as had developed pneumonia by January with low oxygen levels.  This was her last infection.   She has a history of asthma. She follows with Dr Delton Coombes for asthma and OSA.  She is on Breztri 2 puffs twice a day and singulair as above.  She has albuterol that may use 2-3 times a month for relief of symptoms.  Denies nighttime awakeings.   No history of eczema or food allergy.   She has seen an allergist before about 20 years ago or so.   Review of systems in the past 4 weeks: Review of  Systems  Constitutional: Negative.   HENT:  Positive for congestion, rhinorrhea and sinus pressure.   Eyes: Negative.   Respiratory: Negative.    Cardiovascular: Negative.   Gastrointestinal: Negative.   Musculoskeletal: Negative.   Skin: Negative.   Allergic/Immunologic: Negative.   Neurological: Negative.     All other systems negative unless noted above in HPI  Past medical history: Past Medical History:  Diagnosis Date   Angio-edema    ankles   Anxiety    Asthma    Back pain    Constipation    Fatigue    Hypertension    Hypothyroid    Hypothyroidism     Past surgical history: Past Surgical History:  Procedure Laterality Date   BACK SURGERY     COLONOSCOPY     ESOPHAGOGASTRODUODENOSCOPY (EGD) WITH PROPOFOL N/A 01/28/2019   Procedure: ESOPHAGOGASTRODUODENOSCOPY (EGD) WITH PROPOFOL;  Surgeon: Carman Ching, MD;  Location: [facility];  Service: Endoscopy;  Laterality: N/A;   LAPAROSCOPIC OVARIAN CYSTECTOMY Left 06/29/2018   Procedure: LAPAROSCOPIC OVARIAN CYSTECTOMY;  Surgeon: Gerald Leitz, MD;  Location: [facility];  Service: Gynecology;  Laterality: Left;   TONSILLECTOMY     WISDOM TOOTH EXTRACTION      Family history:  Family History  Problem Relation Age of Onset   Hypertension Mother    Hypertension Father    Adrenal disorder Father        uncertain type    Social history: Lives  in a home with carpeting in the bedroom with electric heating and central cooling.  Dog in the home.  No concern for water damage, mildew or roaches in the home.  She is a Psychologist, forensic.  Denies a smoking history.    Medication List: Current Outpatient Medications  Medication Sig Dispense Refill   ALPRAZolam (XANAX) 1 MG tablet Take 1 tablet (1 mg total) by mouth 2 (two) times daily as needed for anxiety. 30 tablet 0   amLODipine (NORVASC) 10 MG tablet Take 10 mg by mouth daily.     azelastine (ASTELIN) 0.1 % nasal spray INSTILL 2 SPRAYS INTO BOTH NOSTRILS  TWICE DAILY 30 mL 2   Budeson-Glycopyrrol-Formoterol (BREZTRI AEROSPHERE) 160-9-4.8 MCG/ACT AERO Inhale 2 puffs into the lungs in the morning and at bedtime. 10.7 g 3   CRANBERRY PO Take 1 capsule by mouth in the morning and at bedtime.     cyclobenzaprine (FLEXERIL) 10 MG tablet Take 1 tablet (10 mg total) by mouth 2 (two) times daily as needed for muscle spasms. 10 tablet 0   fexofenadine (ALLEGRA) 180 MG tablet Take 180 mg by mouth daily.     gabapentin (NEURONTIN) 300 MG capsule Take 1 capsule (300 mg total) by mouth nightly as needed. 30 capsule 0   levothyroxine (SYNTHROID) 75 MCG tablet Take 75 mcg by mouth daily before breakfast.     montelukast (SINGULAIR) 10 MG tablet Take 10 mg by mouth at bedtime.     Multiple Vitamins-Minerals (MULTIVITAMIN WOMEN) TABS Take 1 tablet by mouth daily.     norethindrone-ethinyl estradiol (LOESTRIN) 1-20 MG-MCG tablet Take 1 tablet by mouth daily.     Olopatadine-Mometasone (RYALTRIS) [id number] MCG/ACT SUSP Place 2 sprays into the nose 2 (two) times daily. 29 g 5   omeprazole (PRILOSEC) 20 MG capsule Take 20 mg by mouth 2 (two) times daily.     ondansetron (ZOFRAN) 4 MG tablet Take 4 mg by mouth every 8 (eight) hours as needed for nausea or vomiting.     PARoxetine (PAXIL) 30 MG tablet Take 2 tablets (60 mg total) by mouth at bedtime. 180 tablet 1   Probiotic Product (PROBIOTIC PO) Take 1 capsule by mouth daily at 12 noon.     Semaglutide-Weight Management (WEGOVY) 0.25 MG/0.5ML SOAJ Inject 0.25 mg into the skin once a week. 2 mL 0   traZODone (DESYREL) 50 MG tablet Take 3 tablets (150 mg total) by mouth at bedtime. 270 tablet 1   bacitracin ointment Apply 1 application. topically 2 (two) times daily. (Patient not taking: Reported on 03/04/2022) 14 g 0   predniSONE (DELTASONE) 10 MG tablet Take 4 tablets X 3 days, 3 tabs X 3 days, 2 tabs x 3 days, 1 tab x 3 days (Patient not taking: Reported on 03/04/2022) 30 tablet 0   No current facility-administered  medications for this visit.    Known medication allergies: Allergies  Allergen Reactions   Ciprofloxacin Hcl Diarrhea and Other (See Comments)    Developed C-Diff   Fluticasone Propionate     Other reaction(s): Headaches   Triamcinolone Acetonide     Other reaction(s): rash on face     Physical examination: Blood pressure 134/80, pulse (!) 113, temperature 98.5 F (36.9 C), temperature source Temporal, resp. rate 16, height 5' 2.6" (1.59 m), weight (!) 325 lb (147.4 kg), SpO2 94 %.  General: Alert, interactive, in no acute distress. HEENT: PERRLA, TMs pearly gray, turbinates minimally edematous without discharge, post-pharynx non erythematous. Neck: Supple without  lymphadenopathy. Lungs: Clear to auscultation without wheezing, rhonchi or rales. {no increased work of breathing. CV: Normal S1, S2 without murmurs. Abdomen: Nondistended, nontender. Skin: Warm and dry, without lesions or rashes. Extremities:  No clubbing, cyanosis or edema. Neuro:   Grossly intact.  Diagnositics/Labs: Spirometry: FEV1 2.64L 93%, FVC 3.01L 88% predicted.  Nonobstructive pattern  Allergy testing:   Airborne Adult Perc - 03/04/22 0921     Time Antigen Placed 1191    Allergen Manufacturer Waynette Buttery    Location Back    Number of Test 59    1. Control-Buffer 50% Glycerol Negative    2. Control-Histamine 1 mg/ml 2+    3. Albumin saline Negative    4. Bahia Negative    5. French [place] Territ[place] Negative    6. Johnson Negative    7. Kentucky Blue Negative    8. Meadow Fescue Negative    9. Perennial Rye Negative    10. Sweet Vernal Negative    11. Timothy Negative    12. Cocklebur Negative    13. Burweed Marshelder Negative    14. Ragweed, short Negative    15. Ragweed, Giant Negative    16. Plantain,  English Negative    17. Lamb's Quarters Negative    18. Sheep Sorrell Negative    19. Rough Pigweed Negative    20. Marsh Elder, Rough Negative    21. Mugwort, Common Negative    22. Ash mix 2+    23. Birch  mix Negative    24. Beech American Negative    25. Box, Elder Negative    26. Cedar, red Negative    27. Cottonwood, Guinea[place]Bissau Negative    28. Elm mix 2+    29. Hickory Negative    30. Maple mix Negative    31. Oak, [place] mix Negative    32. Pecan Pollen Negative    33. Pine mix Negative    34. Sycamore Eastern Negative    35. Walnut, Black Pollen Negative    36. Alternaria alternata Negative    37. Cladosporium Herbarum Negative    38. Aspergillus mix Negative    39. Penicillium mix Negative    40. Bipolaris sorokiniana (Helminthosporium) Negative    41. Drechslera spicifera (Curvularia) Negative    42. Mucor plumbeus Negative    43. Fusarium moniliforme Negative    44. Aureobasidium pullulans (pullulara) Negative    45. Rhizopus oryzae Negative    46. Botrytis cinera Negative    47. Epicoccum nigrum Negative    48. Phoma betae Negative    49. Candida Albicans Negative    50. Trichophyton mentagrophytes Negative    51. Mite, D Farinae  5,000 AU/ml 2+    52. Mite, D Pteronyssinus  5,000 AU/ml Negative    53. Cat Hair 10,000 BAU/ml 2+    54.  Dog Epithelia Negative    55. Mixed Feathers Negative    56. Horse Epithelia Negative    57. Cockroach, German Negative    58. Mouse Negative    59. Tobacco Leaf Negative             Intradermal - 03/04/22 0946     Time Antigen Placed 4782    Allergen Manufacturer Waynette Buttery    Location Arm    Number of Test 12    Control Negative    [place] Territ[place] Negative    Johnson Negative    7 Grass Negative    Ragweed mix Negative    Weed mix Negative    Mold  1 Negative    Mold 2 Negative    Mold 3 Negative    Mold 4 Negative    Dog Negative    Cockroach Negative             Allergy testing results were read and interpreted by provider, documented by clinical staff.   Assessment and plan: Allergic rhinitis with conjunctivitis  - Testing today showed: trees, dust mites, and cat. - Copy of test results provided.  - Avoidance  measures provided. - Stop taking: Nasonex and Astelin for now --> replacing both with Ryaltris combo nose spray - Continue with: Allegra (fexofenadine) 180mg  tablet once daily and Singulair (montelukast) 10mg  daily. - Start taking: Ryaltris (olopatadine/mometasone) two sprays per nostril 2 times daily  for nasal congestion or drainage control.  - For allergy based eyedrops can try either Pataday, Alaway or Zaditor over-the-counter options.  - You can use an extra dose of the antihistamine, if needed, for breakthrough symptoms.  - Continue nasal saline rinses daily as needed to remove allergens from the nasal cavities as well as help with mucous clearance (this is especially helpful to do before the nasal sprays are given) - Consider allergy shots as a means of long-term control. - Allergy shots "re-train" and "reset" the immune system to ignore environmental allergens and decrease the resulting immune response to those allergens (sneezing, itchy watery eyes, runny nose, nasal congestion, etc).    - Allergy shots improve symptoms in 75-85% of patients.  - We can discuss more at the next appointment if the medications are not working for you.  Mod persistent asthma - Lung function testing is normal - Daily controller medication(s): Singulair 10mg  daily and Breztri 2 puffs twice a day - Prior to physical activity: albuterol 2 puffs 10-15 minutes before physical activity. - Rescue medications: albuterol 2 puffs every 4-6 hours as needed  - Asthma control goals:  * Full participation in all desired activities (may need albuterol before activity) * Albuterol use two time or less a week on average (not counting use with activity) * Cough interfering with sleep two time or less a month * Oral steroids no more than once a year * No hospitalizations  Follow-up in 4-6 months or sooner if needed  I appreciate the opportunity to take part in [place] care. Please do not hesitate to contact me with  questions.  Sincerely,   09-21-1995, MD Allergy/Immunology [facility]

## 2022-03-16 ENCOUNTER — Other Ambulatory Visit: Payer: Self-pay | Admitting: Psychiatry

## 2022-03-16 DIAGNOSIS — F4001 Agoraphobia with panic disorder: Secondary | ICD-10-CM

## 2022-03-21 ENCOUNTER — Other Ambulatory Visit (HOSPITAL_COMMUNITY): Payer: Self-pay

## 2022-03-21 ENCOUNTER — Other Ambulatory Visit (HOSPITAL_BASED_OUTPATIENT_CLINIC_OR_DEPARTMENT_OTHER): Payer: Self-pay

## 2022-03-21 MED ORDER — WEGOVY 1 MG/0.5ML ~~LOC~~ SOAJ
1.0000 mg | SUBCUTANEOUS | 0 refills | Status: DC
  Filled 2022-03-21 (×3): qty 2, 28d supply, fill #0

## 2022-03-21 MED ORDER — WEGOVY 0.25 MG/0.5ML ~~LOC~~ SOAJ
0.2500 mg | SUBCUTANEOUS | 0 refills | Status: DC
  Filled 2022-03-21: qty 2, 28d supply, fill #0

## 2022-03-21 MED ORDER — WEGOVY 0.5 MG/0.5ML ~~LOC~~ SOAJ
0.5000 mg | SUBCUTANEOUS | 0 refills | Status: DC
  Filled 2022-03-21: qty 2, 28d supply, fill #0

## 2022-03-22 ENCOUNTER — Other Ambulatory Visit (HOSPITAL_BASED_OUTPATIENT_CLINIC_OR_DEPARTMENT_OTHER): Payer: Self-pay

## 2022-04-01 ENCOUNTER — Other Ambulatory Visit (HOSPITAL_COMMUNITY): Payer: Self-pay

## 2022-04-01 MED ORDER — MONTELUKAST SODIUM 10 MG PO TABS
10.0000 mg | ORAL_TABLET | Freq: Every evening | ORAL | 0 refills | Status: DC
  Filled 2022-04-01: qty 90, 90d supply, fill #0

## 2022-04-04 ENCOUNTER — Other Ambulatory Visit (HOSPITAL_COMMUNITY): Payer: Self-pay

## 2022-04-05 ENCOUNTER — Other Ambulatory Visit (HOSPITAL_COMMUNITY): Payer: Self-pay

## 2022-04-05 MED ORDER — ONDANSETRON 4 MG PO TBDP
4.0000 mg | ORAL_TABLET | Freq: Three times a day (TID) | ORAL | 1 refills | Status: DC | PRN
Start: 2022-04-05 — End: 2023-07-18
  Filled 2022-04-05: qty 15, 5d supply, fill #0

## 2022-04-08 ENCOUNTER — Other Ambulatory Visit (HOSPITAL_COMMUNITY): Payer: Self-pay

## 2022-04-11 ENCOUNTER — Other Ambulatory Visit (HOSPITAL_COMMUNITY): Payer: Self-pay

## 2022-04-11 ENCOUNTER — Other Ambulatory Visit (HOSPITAL_BASED_OUTPATIENT_CLINIC_OR_DEPARTMENT_OTHER): Payer: Self-pay

## 2022-04-11 MED ORDER — WEGOVY 1 MG/0.5ML ~~LOC~~ SOAJ
1.0000 mg | SUBCUTANEOUS | 0 refills | Status: DC
  Filled 2022-04-11 (×2): qty 2, 28d supply, fill #0

## 2022-04-14 ENCOUNTER — Other Ambulatory Visit (HOSPITAL_BASED_OUTPATIENT_CLINIC_OR_DEPARTMENT_OTHER): Payer: Self-pay

## 2022-04-14 MED ORDER — INFLUENZA VAC SPLIT QUAD 0.5 ML IM SUSY
PREFILLED_SYRINGE | INTRAMUSCULAR | 0 refills | Status: DC
  Filled 2022-04-14: qty 0.5, 1d supply, fill #0

## 2022-04-14 MED ORDER — COVID-19 MRNA 2023-2024 VACCINE (COMIRNATY) 0.3 ML INJECTION
INTRAMUSCULAR | 0 refills | Status: DC
Start: 2022-04-14 — End: 2023-03-09
  Filled 2022-04-14: qty 0.3, 1d supply, fill #0

## 2022-04-15 ENCOUNTER — Other Ambulatory Visit (HOSPITAL_BASED_OUTPATIENT_CLINIC_OR_DEPARTMENT_OTHER): Payer: Self-pay

## 2022-05-04 ENCOUNTER — Encounter (HOSPITAL_COMMUNITY)
Admission: RE | Disposition: A | Discharge status: Home / Self Care | Payer: Self-pay | Source: Home / Self Care | Attending: Gastroenterology

## 2022-05-04 ENCOUNTER — Ambulatory Visit (HOSPITAL_COMMUNITY)
Admission: RE | Admit: 2022-05-04 | Discharge: 2022-05-04 | Disposition: A | Discharge status: Home / Self Care | Payer: BC Managed Care – PPO | Attending: Gastroenterology | Admitting: Gastroenterology

## 2022-05-04 ENCOUNTER — Other Ambulatory Visit (HOSPITAL_COMMUNITY): Payer: Self-pay

## 2022-05-04 ENCOUNTER — Other Ambulatory Visit (HOSPITAL_BASED_OUTPATIENT_CLINIC_OR_DEPARTMENT_OTHER): Payer: Self-pay

## 2022-05-04 DIAGNOSIS — R059 Cough, unspecified: Secondary | ICD-10-CM | POA: Insufficient documentation

## 2022-05-04 DIAGNOSIS — K219 Gastro-esophageal reflux disease without esophagitis: Secondary | ICD-10-CM | POA: Insufficient documentation

## 2022-05-04 DIAGNOSIS — R1310 Dysphagia, unspecified: Secondary | ICD-10-CM | POA: Diagnosis present

## 2022-05-04 DIAGNOSIS — Z539 Procedure and treatment not carried out, unspecified reason: Secondary | ICD-10-CM | POA: Diagnosis not present

## 2022-05-04 SURGERY — INVASIVE LAB ABORTED CASE

## 2022-05-04 MED ORDER — WEGOVY 1 MG/0.5ML ~~LOC~~ SOAJ
1.0000 mg | SUBCUTANEOUS | 0 refills | Status: DC
  Filled 2022-05-04 – 2022-05-05 (×2): qty 2, 28d supply, fill #0

## 2022-05-04 MED ORDER — LIDOCAINE VISCOUS HCL 2 % MT SOLN
OROMUCOSAL | Status: AC
  Filled 2022-05-04: qty 15

## 2022-05-04 SURGICAL SUPPLY — 2 items
FACESHIELD LNG OPTICON STERILE (SAFETY) IMPLANT
GLOVE BIO SURGEON STRL SZ8 (GLOVE) ×4 IMPLANT

## 2022-05-04 NOTE — Progress Notes (Signed)
Attempted esophageal manometry. Probe inserted twice  per protocol but patient coughing up moderate amounts liquid with bile both times and unable to stop while probe was down. Patient stated did not feel able to continue as could not stop coughing while probe in esophagus. Procedure aborted will let Dr. Michail Sermon know.

## 2022-05-05 ENCOUNTER — Other Ambulatory Visit (HOSPITAL_COMMUNITY): Payer: Self-pay

## 2022-05-05 ENCOUNTER — Other Ambulatory Visit (HOSPITAL_BASED_OUTPATIENT_CLINIC_OR_DEPARTMENT_OTHER): Payer: Self-pay

## 2022-05-05 MED ORDER — WEGOVY 1.7 MG/0.75ML ~~LOC~~ SOAJ
SUBCUTANEOUS | 1 refills | Status: DC
  Filled 2022-05-05 – 2022-05-16 (×2): qty 3, 28d supply, fill #0
  Filled 2022-06-08: qty 3, 28d supply, fill #1

## 2022-05-06 ENCOUNTER — Other Ambulatory Visit (HOSPITAL_BASED_OUTPATIENT_CLINIC_OR_DEPARTMENT_OTHER): Payer: Self-pay

## 2022-05-16 ENCOUNTER — Other Ambulatory Visit (HOSPITAL_BASED_OUTPATIENT_CLINIC_OR_DEPARTMENT_OTHER): Payer: Self-pay

## 2022-06-08 ENCOUNTER — Other Ambulatory Visit: Payer: Self-pay

## 2022-06-16 ENCOUNTER — Other Ambulatory Visit (HOSPITAL_BASED_OUTPATIENT_CLINIC_OR_DEPARTMENT_OTHER): Payer: Self-pay

## 2022-06-16 MED ORDER — SEMAGLUTIDE-WEIGHT MANAGEMENT 1.7 MG/0.75ML ~~LOC~~ SOAJ
1.7000 mg | SUBCUTANEOUS | 1 refills | Status: DC
  Filled 2022-07-16 – 2022-07-18 (×2): qty 3, 28d supply, fill #0

## 2022-06-22 ENCOUNTER — Other Ambulatory Visit: Payer: Self-pay | Admitting: Family Medicine

## 2022-06-22 DIAGNOSIS — K760 Fatty (change of) liver, not elsewhere classified: Secondary | ICD-10-CM

## 2022-07-13 ENCOUNTER — Ambulatory Visit: Payer: BC Managed Care – PPO | Admitting: Psychiatry

## 2022-07-16 ENCOUNTER — Other Ambulatory Visit (HOSPITAL_BASED_OUTPATIENT_CLINIC_OR_DEPARTMENT_OTHER): Payer: Self-pay

## 2022-07-16 ENCOUNTER — Other Ambulatory Visit: Payer: Self-pay

## 2022-07-17 ENCOUNTER — Other Ambulatory Visit (HOSPITAL_BASED_OUTPATIENT_CLINIC_OR_DEPARTMENT_OTHER): Payer: Self-pay

## 2022-07-18 ENCOUNTER — Encounter: Payer: Self-pay | Admitting: Psychiatry

## 2022-07-18 ENCOUNTER — Other Ambulatory Visit (HOSPITAL_BASED_OUTPATIENT_CLINIC_OR_DEPARTMENT_OTHER): Payer: Self-pay

## 2022-07-18 ENCOUNTER — Ambulatory Visit: Payer: BC Managed Care – PPO | Admitting: Psychiatry

## 2022-07-18 DIAGNOSIS — F4001 Agoraphobia with panic disorder: Secondary | ICD-10-CM

## 2022-07-18 DIAGNOSIS — F325 Major depressive disorder, single episode, in full remission: Secondary | ICD-10-CM | POA: Diagnosis not present

## 2022-07-18 DIAGNOSIS — F5105 Insomnia due to other mental disorder: Secondary | ICD-10-CM

## 2022-07-18 MED ORDER — TRAZODONE HCL 150 MG PO TABS: 150.0000 mg | ORAL_TABLET | Freq: Every day | ORAL | 1 refills | Status: DC

## 2022-07-18 MED ORDER — ALPRAZOLAM 1 MG PO TABS: 1.0000 mg | ORAL_TABLET | Freq: Three times a day (TID) | ORAL | 3 refills | Status: DC | PRN

## 2022-07-18 MED ORDER — PAROXETINE HCL 30 MG PO TABS: 60.0000 mg | ORAL_TABLET | Freq: Every day | ORAL | 1 refills | Status: DC

## 2022-07-18 NOTE — Progress Notes (Signed)
Carolyn Castro [id number] 26-Feb-1981 42 y.o.    Subjective:   Patient ID:  Carolyn Castro is a 42 y.o. (DOB [birth year]) female.  Chief Complaint:  Chief Complaint  Patient presents with   Follow-up    Panic disorder with agoraphobia   Depression   Anxiety    HPI Carolyn Castro presents to the office today for follow-up of panic disorder and depression.    When seen September 26, 2017 she had previously been switched from sertraline to paroxetine 40 mg for panic attacks.  She had a good response until a recent bout of health problems had noticed a resurgence of panic.  We decided to increase paroxetine to greater than the usual 60 mg daily from 40 mg daily on August 23, 2018.    When seen May 2020.Health gotten better and anxiety has come down.  Increase in paroxetine has helped her also.  No med changes were made.  She called back in June 2020 saying that her anxiety and insomnia was worse and she was having to take steroids for asthma.  She was allowed to increase Xanax to 0.5 mg #5 daily.  Encouraged to reduce the dose as soon as possible.  It was discovered that she was also getting Xanax from another provider and she was cautioned about that as being inappropriate. Subsequently she is gotten Xanax from another provider on more than one occasion. She called April 09, 2019 in the catering her anxiety was worse.  She was worked in to an appointment today.  seen April 12, 2019.  She accidentally reduced the dosage a couple of months prior and  and now the anxiety is out of hand again. Therefore  Increase the paroxetine to greater than usual dose at 60 to try to limit the need of Bz.  Increase in paroxetine was previously effective at the higher dose.[place] PDMP shows she is getting Xanax 1 mg quantity 30 from PCP, Dr. Shirlean Mylar and oxycodone 5 mg tablets and quantities of 40-60 from [facility]   She last filled a Xanax prescription from our office in March 2020.  Says  she's stopped pain meds.   Last fill date 8/27. 3 ER visits since she was last here for shortness of breath  seen December 2020 & March 2021.  No meds were changed. Increased paroxetine to 60 and it's helped.     02/03/20 appt with the following noted: Consistent with paroxetine 60 since here. Xanax 0.5 mg about twice weekly. Anxiety higher lately.  2 PT jobs including dog sitting.  One of dogs got away and attacked another dog with vet bill.  She had to fork out $1000 to help pay for it.  Realizes she shouldn't have done it.  Got real frutstrated with herself for giving in to their demands. Has done therapy online about twice weekly who is good. Gets numb from stress.  GM died naturally hard.  $ stress.  Louann Sjogren can be demeaning and demanding.  Wants to do international missions but can't DT Covid. Last 2 mos hard bc no recent vacation Dt $ stress as noted with medical bills.  Drained. Sleep well with trazodone. Panic better with paroxetine but not completely stopped but shorter and less severe. Overall really good with panic and using Xanax about 1-2 times weeklly.    Overall anxiety still better with Paxil.  No problems with it.  Likes living at home to get support from parents over her health issues.   Better  handling stress and less panic.  Mostly back to normal.  Likes Paxil better than Zoloft.  Not as sig depressive spells either.  Conc and appetite and function is fair.   Early November had pneumonia and otherwise function is good.   Sleeps better with  trazodone.  8-9 hours. Plan continue paroxetine 60 mg daily which was helpful.  No med changes  07/22/2020 appointment with the following noted: Change jobs TA with schools.  At times worries about job performance.  Overall things going well.  Good coworkers.  Since August.  Some worry over saving $ for the summer.  Can get panicky if overthinks that.  Xanax helps.  Anxiety can get triggered at work too excessively.  Some hypervigilance and  startle. Hx severe asthma Plan: continue paroxetine 60, Xanax 1 mg ana d trazodone 100  01/12/2021 appointment with the following noted: Parents got Covid a few weeks ago and recovered except some fatigue. She has not had it. Anxiety comes and goes. Started counseling [facility] to deal with stressors and and anxiety and it's helped. Less need for Xanax.. Trazodone works without hangover.   In a month half of the days are anxious. Can see people that are stressful and trigger anxiety.  Not much avoidance with counseling help. Fell asleep driving recently and cog problems and pending sleep study in the lab toninght.  Not using CPAP but using o2. No med changes    04/19/21 appt noted: A lot going on.  Labor day Covid with sig sx.  Having to use more Xanax; about 1 daily 3 days per week.  It messed with her mind bc it drug on and started getting paranoid about her health. GF MI at church. Had a sent. Dad felll a few days later with arm fx and pending surgery.  Hyped up about him.  Worries over dad's health and super close to him. Able to sleep ok.  Work is fine with long hours. No SE.  Plan no med changes.  08/23/2021 appointment with the following noted: She continues paroxetine 60 mg daily, Xanax 1 mg twice daily, and trazodone 50 to 100 mg nightly Taking Xanax daily. Recent pneumonia with hospitalization and then Covid triggered anxiety over breathing so still taking Xanax to daily with this but easing up this last week.  Severe asthma Recent second Covid less severe than past. Recurring anxiety and panic problems but getting better. OK with depression.  Continues counseling has been helpful. 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 No Se and not sedated specifically. Still working and doing fine there.  02/07/22 appt noted: Good with p[axil. 1-2 panic per month and takes X anax and it helps.   Depression periods can come and go including 3-4 weeks in April without  reason. Xanax avg 1 mg weekly. Some insomnia incl EMA with racing thoughts but total of 7-8 hours. Still going to therapy and it helped. No SE Plan: No med changes  07/18/22 appt noted: Psych meds: paroxetine 60, Xanax 1 mg twice daily, and trazodone 50 to 100 mg nightly Lost 52# to 278#.  Physcially feels better.  Since August with [facility]. Otherwise pleased with panic.  Only taking Xanax once every 2 weeks. Its reassuring to have it. Sleep good with trazodone.   No SE Using CPAP helps and wt loss helped with breathing.   Had low back surgery end of June 2020 helped.     Past psych meds:  Zoloft 200 NR & SE, , paxil 60,  Xanax 1 mg BID ., hydroxyzine, trazodone 100 helped. Hx severe asthma so no propranolol  Review of Systems:  Review of Systems  Constitutional:  Negative for activity change.  HENT:  Positive for voice change.   Respiratory:  Positive for shortness of breath and wheezing. Negative for chest tightness.   Gastrointestinal:  Negative for abdominal pain.  Musculoskeletal:  Positive for arthralgias and back pain.  Neurological:  Negative for dizziness and tremors.  Psychiatric/Behavioral:  Negative for agitation, behavioral problems, confusion, decreased concentration, hallucinations, self-injury, sleep disturbance and suicidal ideas. The patient is nervous/anxious. The patient is not hyperactive.   asthma about the same.  Medications: I have reviewed the patient's current medications.  Current Outpatient Medications  Medication Sig Dispense Refill   amLODipine (NORVASC) 10 MG tablet Take 10 mg by mouth daily.     azelastine (ASTELIN) 0.1 % nasal spray INSTILL 2 SPRAYS INTO BOTH NOSTRILS TWICE DAILY 30 mL 2   bacitracin ointment Apply 1 application. topically 2 (two) times daily. 14 g 0   Budeson-Glycopyrrol-Formoterol (BREZTRI AEROSPHERE) 160-9-4.8 MCG/ACT AERO Inhale 2 puffs into the lungs in the morning and at bedtime. 10.7 g 3   COVID-19 mRNA vaccine  2023-2024 (COMIRNATY) SUSP injection Inject into the muscle. 0.3 mL 0   CRANBERRY PO Take 1 capsule by mouth in the morning and at bedtime.     cyclobenzaprine (FLEXERIL) 10 MG tablet Take 1 tablet (10 mg total) by mouth 2 (two) times daily as needed for muscle spasms. 10 tablet 0   fexofenadine (ALLEGRA) 180 MG tablet Take 180 mg by mouth daily.     gabapentin (NEURONTIN) 300 MG capsule Take 1 capsule (300 mg total) by mouth nightly as needed. 30 capsule 0   influenza vac split quadrivalent PF (FLUARIX) 0.5 ML injection Inject into the muscle. 0.5 mL 0   levothyroxine (SYNTHROID) 75 MCG tablet Take 75 mcg by mouth daily before breakfast.     montelukast (SINGULAIR) 10 MG tablet Take 10 mg by mouth at bedtime.     montelukast (SINGULAIR) 10 MG tablet Take 1 tablet (10 mg total) by mouth every evening. 90 tablet 0   Multiple Vitamins-Minerals (MULTIVITAMIN WOMEN) TABS Take 1 tablet by mouth daily.     norethindrone-ethinyl estradiol (LOESTRIN) 1-20 MG-MCG tablet Take 1 tablet by mouth daily.     Olopatadine-Mometasone (RYALTRIS) X543819 MCG/ACT SUSP Place 2 sprays into the nose 2 (two) times daily. 29 g 5   omeprazole (PRILOSEC) 20 MG capsule Take 20 mg by mouth 2 (two) times daily.     ondansetron (ZOFRAN) 4 MG tablet Take 4 mg by mouth every 8 (eight) hours as needed for nausea or vomiting.     ondansetron (ZOFRAN-ODT) 4 MG disintegrating tablet Take 1 tablet (4 mg total) by mouth every 8 (eight) hours as needed for nausea 15 tablet 1   Probiotic Product (PROBIOTIC PO) Take 1 capsule by mouth daily at 12 noon.     Semaglutide-Weight Management (WEGOVY) 1 MG/0.5ML SOAJ Inject 1 mg into the skin once a week 2 mL 0   Semaglutide-Weight Management 1.7 MG/0.75ML SOAJ Inject 1.7 mg into the skin once weekly. 3 mL 1   ALPRAZolam (XANAX) 1 MG tablet Take 1 tablet (1 mg total) by mouth 3 (three) times daily as needed for anxiety. 30 tablet 3   PARoxetine (PAXIL) 30 MG tablet Take 2 tablets (60 mg total) by  mouth at bedtime. 180 tablet 1   traZODone (DESYREL) 150 MG tablet Take 1 tablet (  150 mg total) by mouth at bedtime. 90 tablet 1   No current facility-administered medications for this visit.    Medication Side Effects: None  Allergies:  Allergies  Allergen Reactions   Ciprofloxacin Hcl Diarrhea and Other (See Comments)    Developed C-Diff   Fluticasone Propionate     Other reaction(s): Headaches   Triamcinolone Acetonide     Other reaction(s): rash on face    Past Medical History:  Diagnosis Date   Angio-edema    ankles   Anxiety    Asthma    Back pain    Constipation    Fatigue    Hypertension    Hypothyroid    Hypothyroidism     Family History  Problem Relation Age of Onset   Hypertension Mother    Hypertension Father    Adrenal disorder Father        uncertain type    Social History   Socioeconomic History   Marital status: Single    Spouse name: Not on file   Number of children: 0   Years of education: 16   Highest education level: Not on file  Occupational History   Not on file  Tobacco Use   Smoking status: Never    Passive exposure: Never   Smokeless tobacco: Never  Vaping Use   Vaping Use: Never used  Substance and Sexual Activity   Alcohol use: Never   Drug use: Never   Sexual activity: Not Currently    Birth control/protection: Pill  Other Topics Concern   Not on file  Social History Narrative   Left handed   One story home   Drinks caffeine   Social Determinants of Health   Financial Resource Strain: Not on file  Food Insecurity: Not on file  Transportation Needs: Not on file  Physical Activity: Not on file  Stress: Not on file  Social Connections: Not on file  Intimate Partner Violence: Not on file    Past Medical History, Surgical history, Social history, and Family history were reviewed and updated as appropriate.   Please see review of systems for further details on the patient's review from today.   Objective:    Physical Exam:  There were no vitals taken for this visit.  Physical Exam Constitutional:      General: She is not in acute distress.    Appearance: She is obese.  Neurological:     Mental Status: She is alert and oriented to person, place, and time.  Psychiatric:        Attention and Perception: She is attentive. She does not perceive auditory hallucinations.        Mood and Affect: Mood is anxious. Mood is not depressed. Affect is not labile, blunt or angry.        Speech: Speech normal. Speech is not slurred.        Behavior: Behavior normal. Behavior is not withdrawn.        Thought Content: Thought content normal. Thought content is not delusional. Thought content does not include homicidal or suicidal ideation. Thought content does not include suicidal plan.        Cognition and Memory: Cognition normal.        Judgment: Judgment normal.     Comments: Insight intact. Not depressed No auditory or visual hallucinations.  Anxiety chronic but not worse Talkative.     Lab Review:     Component Value Date/Time   NA 135 07/29/2021 0416   K  3.5 07/29/2021 0416   CL 103 07/29/2021 0416   CO2 21 (L) 07/29/2021 0416   GLUCOSE 117 (H) 07/29/2021 0416   BUN 9 07/29/2021 0416   CREATININE 0.96 07/29/2021 0416   CALCIUM 8.5 (L) 07/29/2021 0416   PROT 7.0 10/28/2020 0936   ALBUMIN 3.3 (L) 10/28/2020 0936   AST 34 10/28/2020 0936   ALT 40 10/28/2020 0936   ALKPHOS 69 10/28/2020 0936   BILITOT 0.5 10/28/2020 0936   GFRNONAA >60 07/29/2021 0416   GFRAA >60 12/26/2019 1440       Component Value Date/Time   WBC 11.2 (H) 07/30/2021 0119   RBC 4.31 07/30/2021 0119   HGB 12.6 07/30/2021 0119   HCT 38.0 07/30/2021 0119   PLT 353 07/30/2021 0119   MCV 88.2 07/30/2021 0119   MCH 29.2 07/30/2021 0119   MCHC 33.2 07/30/2021 0119   RDW 13.4 07/30/2021 0119   LYMPHSABS 1.6 10/28/2020 0936   MONOABS 0.5 10/28/2020 0936   EOSABS 0.1 10/28/2020 0936   BASOSABS 0.0 10/28/2020 0936     No results found for: "POCLITH", "LITHIUM"   No results found for: "PHENYTOIN", "PHENOBARB", "VALPROATE", "CBMZ"   .res Assessment: Plan:    Panic disorder with agoraphobia - Plan: PARoxetine (PAXIL) 30 MG tablet, ALPRAZolam (XANAX) 1 MG tablet  Major depressive disorder with single episode, in full remission ([place]) - Plan: PARoxetine (PAXIL) 30 MG tablet  Insomnia due to mental condition - Plan: traZODone (DESYREL) 150 MG tablet   Greater than 50% of 30 min face to face time with patient was spent on counseling and coordination of care. We discussed History of severe panic disorder and mild to moderate major depression which did not respond to sertraline 200 mg a day.  Continue paroxetine to greater than usual dose at 60 to try to limit the need of Bz.  It has helped to partially manage the anxiety and mood.  Increase in paroxetine was effective at further reducing anxiety below the level she had at 40 mg a day Residual anxiety manageable. She remains easily stressed.  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  Using CPAP without O2 at night.  Better with wt loss.  We discussed the short-term risks associated with benzodiazepines including sedation and increased fall risk among others.  Discussed long-term side effect risk including dependence, potential withdrawal symptoms, and the potential eventual dose-related risk of dementia.  But recent studies from 2020 dispute this association between benzodiazepines and dementia risk. Newer studies in 2020 do not support an association with dementia.   Disc risk with opiates.  She is not planning on consistent opiates.   She's using sparingly. Continue Xanax 1 mg BID prn  For sleep continue trazodone  150 mg HS. It helps stop worry at night. Disc sleep hygiene and restriction.  Continue therapy if helpful. Going twice monthly  No med changes  Follow-up 6 mos.  She agrees with the plan.   Lynder Parents MD, DFAPA  Please see  After Visit Summary for patient specific instructions.  Future Appointments  Date Time Provider [place]  08/04/2022  3:20 PM Kennith Gain, MD [facility] None    No orders of the defined types were placed in this encounter.      -------------------------------

## 2022-07-19 ENCOUNTER — Other Ambulatory Visit: Payer: Self-pay | Admitting: Psychiatry

## 2022-07-19 ENCOUNTER — Other Ambulatory Visit (HOSPITAL_COMMUNITY): Payer: Self-pay

## 2022-07-19 DIAGNOSIS — F4001 Agoraphobia with panic disorder: Secondary | ICD-10-CM

## 2022-07-19 MED ORDER — WEGOVY 2.4 MG/0.75ML ~~LOC~~ SOAJ
2.4000 mg | SUBCUTANEOUS | 0 refills | Status: DC
  Filled 2022-07-19 – 2022-08-12 (×3): qty 3, 28d supply, fill #0

## 2022-08-03 ENCOUNTER — Other Ambulatory Visit (HOSPITAL_BASED_OUTPATIENT_CLINIC_OR_DEPARTMENT_OTHER): Payer: Self-pay

## 2022-08-04 ENCOUNTER — Ambulatory Visit: Payer: BC Managed Care – PPO | Admitting: Allergy

## 2022-08-12 ENCOUNTER — Other Ambulatory Visit (HOSPITAL_BASED_OUTPATIENT_CLINIC_OR_DEPARTMENT_OTHER): Payer: Self-pay

## 2022-08-23 ENCOUNTER — Other Ambulatory Visit (HOSPITAL_BASED_OUTPATIENT_CLINIC_OR_DEPARTMENT_OTHER): Payer: Self-pay

## 2022-08-23 MED ORDER — SEMAGLUTIDE-WEIGHT MANAGEMENT 2.4 MG/0.75ML ~~LOC~~ SOAJ
2.4000 mg | SUBCUTANEOUS | 0 refills | Status: DC
  Filled 2022-08-23 – 2022-09-06 (×2): qty 3, 28d supply, fill #0

## 2022-08-24 ENCOUNTER — Other Ambulatory Visit (HOSPITAL_BASED_OUTPATIENT_CLINIC_OR_DEPARTMENT_OTHER): Payer: Self-pay

## 2022-09-06 ENCOUNTER — Other Ambulatory Visit (HOSPITAL_BASED_OUTPATIENT_CLINIC_OR_DEPARTMENT_OTHER): Payer: Self-pay

## 2022-09-12 ENCOUNTER — Other Ambulatory Visit (HOSPITAL_BASED_OUTPATIENT_CLINIC_OR_DEPARTMENT_OTHER): Payer: Self-pay

## 2022-09-12 MED ORDER — WEGOVY 2.4 MG/0.75ML ~~LOC~~ SOAJ
2.4000 mg | SUBCUTANEOUS | 0 refills | Status: DC
  Filled 2022-10-04: qty 3, 28d supply, fill #0

## 2022-10-04 ENCOUNTER — Other Ambulatory Visit (HOSPITAL_BASED_OUTPATIENT_CLINIC_OR_DEPARTMENT_OTHER): Payer: Self-pay

## 2022-10-04 MED ORDER — SEMAGLUTIDE-WEIGHT MANAGEMENT 2.4 MG/0.75ML ~~LOC~~ SOAJ: 2.4000 mg | SUBCUTANEOUS | 0 refills | Status: DC

## 2022-10-11 ENCOUNTER — Other Ambulatory Visit (HOSPITAL_BASED_OUTPATIENT_CLINIC_OR_DEPARTMENT_OTHER): Payer: Self-pay

## 2022-10-18 ENCOUNTER — Ambulatory Visit
Admission: RE | Admit: 2022-10-18 | Discharge: 2022-10-18 | Disposition: A | Discharge status: Home / Self Care | Payer: BC Managed Care – PPO | Source: Ambulatory Visit | Attending: Family Medicine | Admitting: Family Medicine

## 2022-10-18 DIAGNOSIS — K760 Fatty (change of) liver, not elsewhere classified: Secondary | ICD-10-CM

## 2022-11-29 ENCOUNTER — Telehealth: Payer: Self-pay | Admitting: Psychiatry

## 2022-11-29 NOTE — Telephone Encounter (Signed)
Carolyn Castro requesting a letter from Dr Jennelle Human. The letter is to state she is under his care and taking Xanax. She is concerned the Xanax may show up in her urine sample needed for her new job.  Letter may be emailed or sent to her MyChart per her request. Appointment 01/16/23 Contact # [phone]

## 2022-11-29 NOTE — Telephone Encounter (Signed)
Addendum to attached message. Aviannah called re: the same message. She is requesting to pick the letter up here. Phone number is [phone]

## 2022-12-01 NOTE — Telephone Encounter (Signed)
Letter completed and patient notified  

## 2022-12-16 ENCOUNTER — Other Ambulatory Visit: Payer: Self-pay | Admitting: Psychiatry

## 2022-12-16 DIAGNOSIS — F5105 Insomnia due to other mental disorder: Secondary | ICD-10-CM

## 2022-12-28 ENCOUNTER — Other Ambulatory Visit: Payer: Self-pay | Admitting: Psychiatry

## 2022-12-28 DIAGNOSIS — F4001 Agoraphobia with panic disorder: Secondary | ICD-10-CM

## 2023-01-11 ENCOUNTER — Telehealth: Payer: Self-pay | Admitting: Emergency Medicine

## 2023-01-11 NOTE — Telephone Encounter (Signed)
Pt is experiencing sinus congestion, a sore throat, mucus build up, can't sleep at night. Made a ppt for pt for next week 07/17

## 2023-01-16 ENCOUNTER — Ambulatory Visit: Payer: BC Managed Care – PPO | Admitting: Psychiatry

## 2023-01-18 ENCOUNTER — Encounter: Payer: Self-pay | Admitting: Primary Care

## 2023-01-18 ENCOUNTER — Ambulatory Visit (INDEPENDENT_AMBULATORY_CARE_PROVIDER_SITE_OTHER): Payer: BC Managed Care – PPO | Admitting: Primary Care

## 2023-01-18 VITALS — BP 106/64 | HR 93 | Temp 98.1°F | Ht 64.0 in | Wt 263.6 lb

## 2023-01-18 DIAGNOSIS — U071 COVID-19: Secondary | ICD-10-CM | POA: Diagnosis not present

## 2023-01-18 DIAGNOSIS — J208 Acute bronchitis due to other specified organisms: Secondary | ICD-10-CM

## 2023-01-18 MED ORDER — BENZONATATE 100 MG PO CAPS: 100.0000 mg | ORAL_CAPSULE | Freq: Three times a day (TID) | ORAL | 1 refills | Status: DC | PRN

## 2023-01-18 MED ORDER — PREDNISONE 10 MG PO TABS: ORAL_TABLET | ORAL | 0 refills | Status: DC

## 2023-01-18 MED ORDER — DOXYCYCLINE HYCLATE 100 MG PO TABS: 100.0000 mg | ORAL_TABLET | Freq: Two times a day (BID) | ORAL | 0 refills | Status: DC

## 2023-01-18 NOTE — Progress Notes (Signed)
@Patient  ID: Carolyn Castro, female    DOB: 1980/12/18, 42 y.o.   MRN: [id number]  Chief Complaint  Patient presents with   Acute Visit    Referring provider: Camie Patience, FNP  HPI: 42 year old female.  Past medical history significant for obstructive sleep apnea, asthma, allergic rhinitis.  Patient of Dr. Delton Coombes, last seen in office on 11/18/21.  01/18/2023 Patient presents today for acute office visit due to cough.  Patient recently diagnosed with COVID on July 3rd. She took 5 days of antiviral she had on hand. Symptoms have not worsened but continues to have a persistent cough along with sinus and chest congestion. She is getting up a lot of mucus. She has been taking OTC Mucinex Dm and Sudafed. She continues on [employer], PRN Albuterol, Allegra, Singulair and Prilosec as prescribed.    Airview download 10/19/22-01/16/23 Usage 6/90 days (7%) Average usage nights used 5 hours 55 minutes Pressure 8 to 20 cm H2O Air leaks 24 L/min (95%)  AHI 0.3   Allergies  Allergen Reactions   Ciprofloxacin Hcl Diarrhea and Other (See Comments)    Developed C-Diff   Fluticasone Propionate     Other reaction(s): Headaches   Triamcinolone Acetonide     Other reaction(s): rash on face    Immunization History  Administered Date(s) Administered   COVID-19, mRNA, vaccine(Comirnaty)12 years and older 04/15/2022   DTaP 07/20/1982, 09/22/1982, 11/17/1982, 11/27/1985, 02/26/1988   Influenza,inj,Quad PF,6+ Mos 02/20/2019, 05/04/2021, 04/15/2022   Influenza-Unspecified 03/26/2018   Moderna SARS-COV2 Booster Vaccination 05/20/2020   Moderna Sars-Covid-2 Vaccination 09/03/2019, 10/01/2019, 05/05/2020   PFIZER(Purple Top)SARS-COV-2 Vaccination 04/30/2021   Tdap 10/01/2010, 09/03/2020    Past Medical History:  Diagnosis Date   Angio-edema    ankles   Anxiety    Asthma    Back pain    Constipation    Fatigue    Hypertension    Hypothyroid    Hypothyroidism     Tobacco  History: Social History   Tobacco Use  Smoking Status Never   Passive exposure: Never  Smokeless Tobacco Never   Counseling given: Not Answered   Outpatient Medications Prior to Visit  Medication Sig Dispense Refill   ALPRAZolam (XANAX) 1 MG tablet TAKE 1 TABLET(1 MG) BY MOUTH THREE TIMES DAILY AS NEEDED FOR ANXIETY 30 tablet 0   amLODipine (NORVASC) 10 MG tablet Take 10 mg by mouth daily.     azelastine (ASTELIN) 0.1 % nasal spray INSTILL 2 SPRAYS INTO BOTH NOSTRILS TWICE DAILY 30 mL 2   bacitracin ointment Apply 1 application. topically 2 (two) times daily. 14 g 0   Budeson-Glycopyrrol-Formoterol (BREZTRI AEROSPHERE) 160-9-4.8 MCG/ACT AERO Inhale 2 puffs into the lungs in the morning and at bedtime. 10.7 g 3   CRANBERRY PO Take 1 capsule by mouth in the morning and at bedtime.     cyclobenzaprine (FLEXERIL) 10 MG tablet Take 1 tablet (10 mg total) by mouth 2 (two) times daily as needed for muscle spasms. 10 tablet 0   fexofenadine (ALLEGRA) 180 MG tablet Take 180 mg by mouth daily.     gabapentin (NEURONTIN) 300 MG capsule Take 1 capsule (300 mg total) by mouth nightly as needed. 30 capsule 0   levothyroxine (SYNTHROID) 75 MCG tablet Take 75 mcg by mouth daily before breakfast.     metFORMIN (GLUCOPHAGE-XR) 500 MG 24 hr tablet Take 500 mg by mouth 2 (two) times daily.     montelukast (SINGULAIR) 10 MG tablet Take 10 mg by mouth at  bedtime.     Multiple Vitamins-Minerals (MULTIVITAMIN WOMEN) TABS Take 1 tablet by mouth daily.     norethindrone-ethinyl estradiol (LOESTRIN) 1-20 MG-MCG tablet Take 1 tablet by mouth daily.     Olopatadine-Mometasone (RYALTRIS) X543819 MCG/ACT SUSP Place 2 sprays into the nose 2 (two) times daily. 29 g 5   omeprazole (PRILOSEC) 20 MG capsule Take 20 mg by mouth 2 (two) times daily.     ondansetron (ZOFRAN) 4 MG tablet Take 4 mg by mouth every 8 (eight) hours as needed for nausea or vomiting.     PARoxetine (PAXIL) 30 MG tablet Take 2 tablets (60 mg total)  by mouth at bedtime. 180 tablet 1   phentermine (ADIPEX-P) 37.5 MG tablet Take 37.5 mg by mouth daily before breakfast. 1/2 tablet     Probiotic Product (PROBIOTIC PO) Take 1 capsule by mouth daily at 12 noon.     topiramate (TOPAMAX) 50 MG tablet Take 50 mg by mouth daily.     traZODone (DESYREL) 150 MG tablet Take 1 tablet (150 mg total) by mouth at bedtime. 90 tablet 1   COVID-19 mRNA vaccine 2023-2024 (COMIRNATY) SUSP injection Inject into the muscle. (Patient not taking: Reported on 01/18/2023) 0.3 mL 0   influenza vac split quadrivalent PF (FLUARIX) 0.5 ML injection Inject into the muscle. (Patient not taking: Reported on 01/18/2023) 0.5 mL 0   montelukast (SINGULAIR) 10 MG tablet Take 1 tablet (10 mg total) by mouth every evening. (Patient not taking: Reported on 01/18/2023) 90 tablet 0   ondansetron (ZOFRAN-ODT) 4 MG disintegrating tablet Take 1 tablet (4 mg total) by mouth every 8 (eight) hours as needed for nausea (Patient not taking: Reported on 01/18/2023) 15 tablet 1   Semaglutide-Weight Management (WEGOVY) 1 MG/0.5ML SOAJ Inject 1 mg into the skin once a week (Patient not taking: Reported on 01/18/2023) 2 mL 0   Semaglutide-Weight Management (WEGOVY) 2.4 MG/0.75ML SOAJ Inject 2.4 mg into the skin once a week. (Patient not taking: Reported on 01/18/2023) 3 mL 0   Semaglutide-Weight Management 1.7 MG/0.75ML SOAJ Inject 1.7 mg into the skin once weekly. (Patient not taking: Reported on 01/18/2023) 3 mL 1   Semaglutide-Weight Management 2.4 MG/0.75ML SOAJ Inject 2.4 mg into the skin once a week. (Patient not taking: Reported on 01/18/2023) 3 mL 0   traZODone (DESYREL) 50 MG tablet TAKE 3 TABLETS(150 MG) BY MOUTH AT BEDTIME (Patient not taking: Reported on 01/18/2023) 270 tablet 1   No facility-administered medications prior to visit.      Review of Systems  Review of Systems  Constitutional: Negative.  Negative for fever.  HENT: Negative.    Respiratory:  Positive for cough. Negative for  shortness of breath.   Cardiovascular: Negative.      Physical Exam  BP 106/64 (BP Location: Left Arm, Patient Position: Sitting, Cuff Size: Large)   Pulse 93   Temp 98.1 F (36.7 C) (Oral)   Ht 5\' 4"  (1.626 m)   Wt 263 lb 9.6 oz (119.6 kg)   SpO2 100%   BMI 45.25 kg/m  Physical Exam Constitutional:      Appearance: Normal appearance.  HENT:     Head: Normocephalic and atraumatic.     Mouth/Throat:     Mouth: Mucous membranes are moist.     Pharynx: Oropharynx is clear.  Cardiovascular:     Rate and Rhythm: Normal rate and regular rhythm.  Pulmonary:     Effort: Pulmonary effort is normal.     Breath sounds: Normal breath  sounds.  Neurological:     General: No focal deficit present.     Mental Status: She is alert and oriented to person, place, and time. Mental status is at baseline.  Psychiatric:        Mood and Affect: Mood normal.        Behavior: Behavior normal.        Thought Content: Thought content normal.        Judgment: Judgment normal.      Lab Results:  CBC    Component Value Date/Time   WBC 11.2 (H) 07/30/2021 0119   RBC 4.31 07/30/2021 0119   HGB 12.6 07/30/2021 0119   HCT 38.0 07/30/2021 0119   PLT 353 07/30/2021 0119   MCV 88.2 07/30/2021 0119   MCH 29.2 07/30/2021 0119   MCHC 33.2 07/30/2021 0119   RDW 13.4 07/30/2021 0119   LYMPHSABS 1.6 10/28/2020 0936   MONOABS 0.5 10/28/2020 0936   EOSABS 0.1 10/28/2020 0936   BASOSABS 0.0 10/28/2020 0936    BMET    Component Value Date/Time   NA 135 07/29/2021 0416   K 3.5 07/29/2021 0416   CL 103 07/29/2021 0416   CO2 21 (L) 07/29/2021 0416   GLUCOSE 117 (H) 07/29/2021 0416   BUN 9 07/29/2021 0416   CREATININE 0.96 07/29/2021 0416   CALCIUM 8.5 (L) 07/29/2021 0416   GFRNONAA >60 07/29/2021 0416   GFRAA >60 12/26/2019 1440    BNP    Component Value Date/Time   BNP 11.7 05/22/2019 0512    ProBNP    Component Value Date/Time   PROBNP 24 09/10/2019 1416    Imaging: No results  found.   Assessment & Plan:   Acute bronchitis due to COVID-19 virus - Tested positive for Covid on 01/04/23, took 5 day course of antiviral. Continues to have persistent productive cough along with sinus congestion. Sputum is purulent  - Rx doxycycline 100mg  twice daily x 7 days as prescribed - Continue Mucinex 600mg  twice daily to some congestion    Asthma - Stable; Does not appear acutely exacerbated - Continue Breztri Aerosphere 2 puffs twice daily - Use albuterol every 6 hours as needed for breakthrough sob/wheezing - Prednisone taper to have on hand if you develop worsening shortness of breath/wheezing  Allergic rhinitis - Continue OTC Allegra and Astelin nasal spray as directed - Use saline rinses 1-2 times a day as needed for sinus congestion   Glenford Bayley, NP 01/23/2023

## 2023-01-18 NOTE — Patient Instructions (Addendum)
Nice to see you today Carolyn Castro, sorry you are not feeling well  Recommendations Continue Breztri Aerosphere 2 puffs twice daily Use albuterol every 6 hours as needed for breakthrough shortness of breath or wheezing Continue allergy medication, Mucinex and nasal sprays as you have been Use saline rinses 1-2 times a day as needed for sinus congestion Take doxycycline as prescribed Prednisone taper to have on hand if you develop worsening shortness of breath/wheezing Resume CPAP use nightly when able  Follow-up If symptoms do not improve or worsen

## 2023-01-20 NOTE — Telephone Encounter (Signed)
Patient was seen by [facility] on 7/17. Will close encounter.

## 2023-01-23 DIAGNOSIS — J208 Acute bronchitis due to other specified organisms: Secondary | ICD-10-CM | POA: Insufficient documentation

## 2023-01-23 NOTE — Assessment & Plan Note (Addendum)
-   Continue OTC Allegra and Astelin nasal spray as directed - Use saline rinses 1-2 times a day as needed for sinus congestion

## 2023-01-23 NOTE — Assessment & Plan Note (Addendum)
-   Tested positive for Covid on 01/04/23, took 5 day course of antiviral. Continues to have persistent productive cough along with sinus congestion. Sputum is purulent  - Rx doxycycline 100mg  twice daily x 7 days as prescribed - Continue Mucinex 600mg  twice daily to some congestion

## 2023-01-23 NOTE — Assessment & Plan Note (Addendum)
-   Stable; Does not appear acutely exacerbated - Continue Breztri Aerosphere 2 puffs twice daily - Use albuterol every 6 hours as needed for breakthrough sob/wheezing - Prednisone taper to have on hand if you develop worsening shortness of breath/wheezing

## 2023-01-26 ENCOUNTER — Other Ambulatory Visit: Payer: Self-pay | Admitting: Psychiatry

## 2023-01-26 DIAGNOSIS — F4001 Agoraphobia with panic disorder: Secondary | ICD-10-CM

## 2023-03-09 ENCOUNTER — Ambulatory Visit (INDEPENDENT_AMBULATORY_CARE_PROVIDER_SITE_OTHER): Payer: BC Managed Care – PPO | Admitting: Psychiatry

## 2023-03-09 ENCOUNTER — Encounter: Payer: Self-pay | Admitting: Psychiatry

## 2023-03-09 DIAGNOSIS — F4001 Agoraphobia with panic disorder: Secondary | ICD-10-CM | POA: Diagnosis not present

## 2023-03-09 DIAGNOSIS — F5105 Insomnia due to other mental disorder: Secondary | ICD-10-CM

## 2023-03-09 DIAGNOSIS — F325 Major depressive disorder, single episode, in full remission: Secondary | ICD-10-CM

## 2023-03-09 MED ORDER — TRAZODONE HCL 150 MG PO TABS: 150.0000 mg | ORAL_TABLET | Freq: Every day | ORAL | 1 refills | Status: DC

## 2023-03-09 MED ORDER — PAROXETINE HCL 30 MG PO TABS: 60.0000 mg | ORAL_TABLET | Freq: Every day | ORAL | 1 refills | Status: DC

## 2023-03-09 NOTE — Progress Notes (Signed)
Carolyn Castro [id number] 02/04/1981 42 y.o.    Subjective:   Patient ID:  Carolyn Castro is a 42 y.o. (DOB Mar 25, 1981) female.  Chief Complaint:  Chief Complaint  Patient presents with   Follow-up    Mood and anxiety an d sleep    HPI Carolyn Castro presents to the office today for follow-up of panic disorder and depression.    When seen September 26, 2017 she had previously been switched from sertraline to paroxetine 40 mg for panic attacks.  She had a good response until a recent bout of health problems had noticed a resurgence of panic.  We decided to increase paroxetine to greater than the usual 60 mg daily from 40 mg daily on August 23, 2018.    When seen May 2020.Health gotten better and anxiety has come down.  Increase in paroxetine has helped her also.  No med changes were made.  She called back in June 2020 saying that her anxiety and insomnia was worse and she was having to take steroids for asthma.  She was allowed to increase Xanax to 0.5 mg #5 daily.  Encouraged to reduce the dose as soon as possible.  It was discovered that she was also getting Xanax from another provider and she was cautioned about that as being inappropriate. Subsequently she is gotten Xanax from another provider on more than one occasion. She called April 09, 2019 in the catering her anxiety was worse.  She was worked in to an appointment today.  seen April 12, 2019.  She accidentally reduced the dosage a couple of months prior and  and now the anxiety is out of hand again. Therefore  Increase the paroxetine to greater than usual dose at 60 to try to limit the need of Bz.  Increase in paroxetine was previously effective at the higher dose.[place] PDMP shows she is getting Xanax 1 mg quantity 30 from PCP, Dr. Shirlean Mylar and oxycodone 5 mg tablets and quantities of 40-60 from [facility].   She last filled a Xanax prescription from our office in March 2020.  Says she's stopped pain meds.   Last  fill date 8/27. 3 ER visits since she was last here for shortness of breath  seen December 2020 & March 2021.  No meds were changed. Increased paroxetine to 60 and it's helped.     02/03/20 appt with the following noted: Consistent with paroxetine 60 since here. Xanax 0.5 mg about twice weekly. Anxiety higher lately.  2 PT jobs including dog sitting.  One of dogs got away and attacked another dog with vet bill.  She had to fork out $1000 to help pay for it.  Realizes she shouldn't have done it.  Got real frutstrated with herself for giving in to their demands. Has done therapy online about twice weekly who is good. Gets numb from stress.  GM died naturally hard.  $ stress.  Louann Sjogren can be demeaning and demanding.  Wants to do international missions but can't DT Covid. Last 2 mos hard bc no recent vacation Dt $ stress as noted with medical bills.  Drained. Sleep well with trazodone. Panic better with paroxetine but not completely stopped but shorter and less severe. Overall really good with panic and using Xanax about 1-2 times weeklly.    Overall anxiety still better with Paxil.  No problems with it.  Likes living at home to get support from parents over her health issues.   Better handling stress and less  panic.  Mostly back to normal.  Likes Paxil better than Zoloft.  Not as sig depressive spells either.  Conc and appetite and function is fair.   Early November had pneumonia and otherwise function is good.   Sleeps better with  trazodone.  8-9 hours. Plan continue paroxetine 60 mg daily which was helpful.  No med changes  07/22/2020 appointment with the following noted: Change jobs TA with schools.  At times worries about job performance.  Overall things going well.  Good coworkers.  Since August.  Some worry over saving $ for the summer.  Can get panicky if overthinks that.  Xanax helps.  Anxiety can get triggered at work too excessively.  Some hypervigilance and startle. Hx severe asthma Plan:  continue paroxetine 60, Xanax 1 mg ana d trazodone 100  01/12/2021 appointment with the following noted: Parents got Covid a few weeks ago and recovered except some fatigue. She has not had it. Anxiety comes and goes. Started counseling [facility] to deal with stressors and and anxiety and it's helped. Less need for Xanax.. Trazodone works without hangover.   In a month half of the days are anxious. Can see people that are stressful and trigger anxiety.  Not much avoidance with counseling help. Fell asleep driving recently and cog problems and pending sleep study in the lab toninght.  Not using CPAP but using o2. No med changes    04/19/21 appt noted: A lot going on.  Labor day Covid with sig sx.  Having to use more Xanax; about 1 daily 3 days per week.  It messed with her mind bc it drug on and started getting paranoid about her health. GF MI at church. Had a sent. Dad felll a few days later with arm fx and pending surgery.  Hyped up about him.  Worries over dad's health and super close to him. Able to sleep ok.  Work is fine with long hours. No SE.  Plan no med changes.  08/23/2021 appointment with the following noted: She continues paroxetine 60 mg daily, Xanax 1 mg twice daily, and trazodone 50 to 100 mg nightly Taking Xanax daily. Recent pneumonia with hospitalization and then Covid triggered anxiety over breathing so still taking Xanax to daily with this but easing up this last week.  Severe asthma Recent second Covid less severe than past. Recurring anxiety and panic problems but getting better. OK with depression.  Continues counseling has been helpful. 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 No Se and not sedated specifically. Still working and doing fine there.  02/07/22 appt noted: Good with p[axil. 1-2 panic per month and takes X anax and it helps.   Depression periods can come and go including 3-4 weeks in April without reason. Xanax avg 1 mg weekly. Some  insomnia incl EMA with racing thoughts but total of 7-8 hours. Still going to therapy and it helped. No SE Plan: No med changes  07/18/22 appt noted: Psych meds: paroxetine 60, Xanax 1 mg twice daily, and trazodone 50 to 100 mg nightly Lost 52# to 278#.  Physcially feels better.  Since August with [facility]. Otherwise pleased with panic.  Only taking Xanax once every 2 weeks. Its reassuring to have it. Sleep good with trazodone.   No SE Using CPAP helps and wt loss helped with breathing. Plan no changes  03/09/23 appt noted:  Good overall.   Constant HA ? Sinus for last 2-3 mos.  Year round allergies.  Using Flonase. Wt loss doctor.  Lost 74#.  Stopped [employer] Dt insurance but benefit topiramate and metformin.  Better self esteem and mentally bc not hungry all the time.  Also helped breathing.   Got job [employer] Saturdays.  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 Real pleased with meds.  Mentally I've been fine.  Able to handle things better. Only needed Xanax 1 mg 3-4 per month. Sleep ok with trazodone.   No SE with meds.   No panic.  No sig avoidance.   3rd time with Covid felt jittery and anxious for a month. "Very paranoid" resolved.  Same thing happened first time with Covid.   Only child and close to parents.    Had low back surgery end of June 2020 helped.     Past psych meds:  Zoloft 200 NR & SE,  paxil 60 good resp.  Xanax 1 mg BID ., hydroxyzine, trazodone 100 helped. Hx severe asthma so no propranolol  Review of Systems:  Review of Systems  Constitutional:  Negative for activity change.  HENT:  Positive for voice change.   Respiratory:  Positive for shortness of breath and wheezing. Negative for chest tightness.   Gastrointestinal:  Negative for abdominal pain.  Musculoskeletal:  Positive for arthralgias and back pain.  Neurological:  Negative for dizziness and tremors.  Psychiatric/Behavioral:  Negative for agitation,  behavioral problems, confusion, decreased concentration, hallucinations, self-injury, sleep disturbance and suicidal ideas. The patient is nervous/anxious. The patient is not hyperactive.   asthma about the same.  Medications: I have reviewed the patient's current medications.  Current Outpatient Medications  Medication Sig Dispense Refill   ALPRAZolam (XANAX) 1 MG tablet TAKE 1 TABLET(1 MG) BY MOUTH THREE TIMES DAILY AS NEEDED FOR ANXIETY 30 tablet 1   amLODipine (NORVASC) 10 MG tablet Take 10 mg by mouth daily.     azelastine (ASTELIN) 0.1 % nasal spray INSTILL 2 SPRAYS INTO BOTH NOSTRILS TWICE DAILY 30 mL 2   bacitracin ointment Apply 1 application. topically 2 (two) times daily. 14 g 0   benzonatate (TESSALON) 100 MG capsule Take 1 capsule (100 mg total) by mouth 3 (three) times daily as needed for cough. 30 capsule 1   Budeson-Glycopyrrol-Formoterol (BREZTRI AEROSPHERE) 160-9-4.8 MCG/ACT AERO Inhale 2 puffs into the lungs in the morning and at bedtime. 10.7 g 3   CRANBERRY PO Take 1 capsule by mouth in the morning and at bedtime.     cyclobenzaprine (FLEXERIL) 10 MG tablet Take 1 tablet (10 mg total) by mouth 2 (two) times daily as needed for muscle spasms. 10 tablet 0   doxycycline (VIBRA-TABS) 100 MG tablet Take 1 tablet (100 mg total) by mouth 2 (two) times daily. 14 tablet 0   fexofenadine (ALLEGRA) 180 MG tablet Take 180 mg by mouth daily.     gabapentin (NEURONTIN) 300 MG capsule Take 1 capsule (300 mg total) by mouth nightly as needed. 30 capsule 0   influenza vac split quadrivalent PF (FLUARIX) 0.5 ML injection Inject into the muscle. 0.5 mL 0   levothyroxine (SYNTHROID) 75 MCG tablet Take 75 mcg by mouth daily before breakfast.     metFORMIN (GLUCOPHAGE-XR) 500 MG 24 hr tablet Take 500 mg by mouth 2 (two) times daily.     montelukast (SINGULAIR) 10 MG tablet Take 10 mg by mouth at bedtime.     montelukast (SINGULAIR) 10 MG tablet Take 1 tablet (10 mg total) by mouth every evening.  90  tablet 0   Multiple Vitamins-Minerals (MULTIVITAMIN WOMEN) TABS Take 1 tablet by mouth daily.     norethindrone-ethinyl estradiol (LOESTRIN) 1-20 MG-MCG tablet Take 1 tablet by mouth daily.     Olopatadine-Mometasone (RYALTRIS) [id number] MCG/ACT SUSP Place 2 sprays into the nose 2 (two) times daily. 29 g 5   omeprazole (PRILOSEC) 20 MG capsule Take 20 mg by mouth 2 (two) times daily.     ondansetron (ZOFRAN) 4 MG tablet Take 4 mg by mouth every 8 (eight) hours as needed for nausea or vomiting.     ondansetron (ZOFRAN-ODT) 4 MG disintegrating tablet Take 1 tablet (4 mg total) by mouth every 8 (eight) hours as needed for nausea 15 tablet 1   phentermine (ADIPEX-P) 37.5 MG tablet Take 37.5 mg by mouth daily before breakfast. 1/2 tablet     predniSONE (DELTASONE) 10 MG tablet 4 tabs for 2 days, then 3 tabs for 2 days, 2 tabs for 2 days, then 1 tab for 2 days, then stop 20 tablet 0   Probiotic Product (PROBIOTIC PO) Take 1 capsule by mouth daily at 12 noon.     topiramate (TOPAMAX) 50 MG tablet Take 50 mg by mouth daily.     PARoxetine (PAXIL) 30 MG tablet Take 2 tablets (60 mg total) by mouth at bedtime. 180 tablet 1   Semaglutide-Weight Management (WEGOVY) 1 MG/0.5ML SOAJ Inject 1 mg into the skin once a week (Patient not taking: Reported on 03/09/2023) 2 mL 0   Semaglutide-Weight Management (WEGOVY) 2.4 MG/0.75ML SOAJ Inject 2.4 mg into the skin once a week. (Patient not taking: Reported on 03/09/2023) 3 mL 0   Semaglutide-Weight Management 1.7 MG/0.75ML SOAJ Inject 1.7 mg into the skin once weekly. (Patient not taking: Reported on 03/09/2023) 3 mL 1   Semaglutide-Weight Management 2.4 MG/0.75ML SOAJ Inject 2.4 mg into the skin once a week. (Patient not taking: Reported on 03/09/2023) 3 mL 0   traZODone (DESYREL) 150 MG tablet Take 1 tablet (150 mg total) by mouth at bedtime. 90 tablet 1   No current facility-administered medications for this visit.    Medication Side Effects: None  Allergies:   Allergies  Allergen Reactions   Ciprofloxacin Hcl Diarrhea and Other (See Comments)    Developed C-Diff   Fluticasone Propionate     Other reaction(s): Headaches   Triamcinolone Acetonide     Other reaction(s): rash on face    Past Medical History:  Diagnosis Date   Angio-edema    ankles   Anxiety    Asthma    Back pain    Constipation    Fatigue    Hypertension    Hypothyroid    Hypothyroidism     Family History  Problem Relation Age of Onset   Hypertension Mother    Hypertension Father    Adrenal disorder Father        uncertain type    Social History   Socioeconomic History   Marital status: Single    Spouse name: Not on file   Number of children: 0   Years of education: 16   Highest education level: Not on file  Occupational History   Not on file  Tobacco Use   Smoking status: Never    Passive exposure: Never   Smokeless tobacco: Never  Vaping Use   Vaping status: Never Used  Substance and Sexual Activity   Alcohol use: Never   Drug use: Never   Sexual activity: Not Currently    Birth control/protection: Pill  Other Topics Concern   Not on file  Social History Narrative   Left handed   One story home   Drinks caffeine   Social Determinants of Health   Financial Resource Strain: Not on file  Food Insecurity: Not on file  Transportation Needs: Not on file  Physical Activity: Not on file  Stress: Not on file  Social Connections: Unknown (07/13/2022)   [facility]   Social Network    Social Network: Not on file  Intimate Partner Violence: Unknown (07/13/2022)   Received from [employer], [facility]   HITS    Physically Hurt: Not on file    Insult or Talk Down To: Not on file    Threaten Physical Harm: Not on file    Scream or Curse: Not on file    Past Medical History, Surgical history, Social history, and Family history were reviewed and updated as appropriate.   Please see review of systems for  further details on the patient's review from today.   Objective:   Physical Exam:  There were no vitals taken for this visit.  Physical Exam Constitutional:      General: She is not in acute distress.    Appearance: She is obese.  Neurological:     Mental Status: She is alert and oriented to person, place, and time.  Psychiatric:        Attention and Perception: She is attentive. She does not perceive auditory hallucinations.        Mood and Affect: Mood is anxious. Mood is not depressed. Affect is not labile, blunt or angry.        Speech: Speech normal. Speech is not slurred.        Behavior: Behavior normal. Behavior is not withdrawn.        Thought Content: Thought content normal. Thought content is not delusional. Thought content does not include homicidal or suicidal ideation. Thought content does not include suicidal plan.        Cognition and Memory: Cognition normal.        Judgment: Judgment normal.     Comments: Insight intact. Not depressed No auditory or visual hallucinations.  Anxiety chronic but not worse Talkative.     Lab Review:     Component Value Date/Time   NA 135 07/29/2021 0416   K 3.5 07/29/2021 0416   CL 103 07/29/2021 0416   CO2 21 (L) 07/29/2021 0416   GLUCOSE 117 (H) 07/29/2021 0416   BUN 9 07/29/2021 0416   CREATININE 0.96 07/29/2021 0416   CALCIUM 8.5 (L) 07/29/2021 0416   PROT 7.0 10/28/2020 0936   ALBUMIN 3.3 (L) 10/28/2020 0936   AST 34 10/28/2020 0936   ALT 40 10/28/2020 0936   ALKPHOS 69 10/28/2020 0936   BILITOT 0.5 10/28/2020 0936   GFRNONAA >60 07/29/2021 0416   GFRAA >60 12/26/2019 1440       Component Value Date/Time   WBC 11.2 (H) 07/30/2021 0119   RBC 4.31 07/30/2021 0119   HGB 12.6 07/30/2021 0119   HCT 38.0 07/30/2021 0119   PLT 353 07/30/2021 0119   MCV 88.2 07/30/2021 0119   MCH 29.2 07/30/2021 0119   MCHC 33.2 07/30/2021 0119   RDW 13.4 07/30/2021 0119   LYMPHSABS 1.6 10/28/2020 0936   MONOABS 0.5 10/28/2020  0936   EOSABS 0.1 10/28/2020 0936   BASOSABS 0.0 10/28/2020 0936    No results found for: "POCLITH", "LITHIUM"   No results found for: "  PHENYTOIN", "PHENOBARB", "VALPROATE", "CBMZ"   .res Assessment: Plan:    Panic disorder with agoraphobia - Plan: PARoxetine (PAXIL) 30 MG tablet  Major depressive disorder with single episode, in full remission (HCC) - Plan: PARoxetine (PAXIL) 30 MG tablet  Insomnia due to mental condition - Plan: traZODone (DESYREL) 150 MG tablet   30 min face to face time with patient was spent on counseling and coordination of care. We discussed History of severe panic disorder and mild to moderate major depression which did not respond to sertraline 200 mg a day.  Continue paroxetine to greater than usual dose at 60 to try to limit the need of Bz.  It has helped to partially manage the anxiety and mood.  Increase in paroxetine was effective at further reducing anxiety below the level she had at 40 mg a day Residual anxiety manageable. She remains easily stressed.  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  Using CPAP without O2 at night.  Better with wt loss.  We discussed the short-term risks associated with benzodiazepines including sedation and increased fall risk among others.  Discussed long-term side effect risk including dependence, potential withdrawal symptoms, and the potential eventual dose-related risk of dementia.  But recent studies from 2020 dispute this association between benzodiazepines and dementia risk. Newer studies in 2020 do not support an association with dementia.   Disc risk with opiates.  She is not planning on consistent opiates.   She's using sparingly. Continue Xanax 1 mg BID prn  For sleep continue trazodone  150 mg HS. It helps stop worry at night. Disc sleep hygiene and restriction.  Continue therapy if helpful. Going twice monthly  Disc last Covid infx associated with paranoia for a month.  Happened twice.  If happens  again then consider brief risperidone.  No med changes: paroxetine 60, Xanax 1 mg Bid prn, trazodone 150 HS  Follow-up 6 mos.  She agrees with the plan.   Meredith Staggers MD, DFAPA  Please see After Visit Summary for patient specific instructions.  No future appointments.   No orders of the defined types were placed in this encounter.      -------------------------------

## 2023-03-18 ENCOUNTER — Emergency Department (HOSPITAL_COMMUNITY)
Admission: EM | Admit: 2023-03-18 | Discharge: 2023-03-18 | Disposition: A | Discharge status: Home / Self Care | Payer: BC Managed Care – PPO | Attending: Emergency Medicine | Admitting: Emergency Medicine

## 2023-03-18 ENCOUNTER — Other Ambulatory Visit: Payer: Self-pay

## 2023-03-18 ENCOUNTER — Encounter (HOSPITAL_COMMUNITY): Payer: Self-pay

## 2023-03-18 ENCOUNTER — Emergency Department (HOSPITAL_COMMUNITY): Payer: BC Managed Care – PPO

## 2023-03-18 DIAGNOSIS — M5441 Lumbago with sciatica, right side: Secondary | ICD-10-CM | POA: Diagnosis not present

## 2023-03-18 DIAGNOSIS — M5442 Lumbago with sciatica, left side: Secondary | ICD-10-CM | POA: Diagnosis not present

## 2023-03-18 DIAGNOSIS — G8929 Other chronic pain: Secondary | ICD-10-CM

## 2023-03-18 DIAGNOSIS — M545 Low back pain, unspecified: Secondary | ICD-10-CM | POA: Diagnosis present

## 2023-03-18 LAB — BASIC METABOLIC PANEL
Anion gap: 10 (ref 5–15)
BUN: 13 mg/dL (ref 6–20)
CO2: 21 mmol/L — ABNORMAL LOW (ref 22–32)
Calcium: 9.3 mg/dL (ref 8.9–10.3)
Chloride: 106 mmol/L (ref 98–111)
Creatinine, Ser: 1.13 mg/dL — ABNORMAL HIGH (ref 0.44–1.00)
GFR, Estimated: >60 mL/min (ref >60)
Glucose, Bld: 104 mg/dL — ABNORMAL HIGH (ref 70–99)
Potassium: 4.4 mmol/L (ref 3.5–5.1)
Sodium: 137 mmol/L (ref 135–145)

## 2023-03-18 LAB — CBC WITH DIFFERENTIAL/PLATELET
Abs Immature Granulocytes: 0.06 10*3/uL (ref 0.00–0.07)
Basophils Absolute: 0 10*3/uL (ref 0.0–0.1)
Basophils Relative: 0 %
Eosinophils Absolute: 0 10*3/uL (ref 0.0–0.5)
Eosinophils Relative: 0 %
HCT: 44.4 % (ref 36.0–46.0)
Hemoglobin: 14.6 g/dL (ref 12.0–15.0)
Immature Granulocytes: 1 %
Lymphocytes Relative: 14 %
Lymphs Abs: 1.5 10*3/uL (ref 0.7–4.0)
MCH: 30.5 pg (ref 26.0–34.0)
MCHC: 32.9 g/dL (ref 30.0–36.0)
MCV: 92.7 fL (ref 80.0–100.0)
Monocytes Absolute: 0.2 10*3/uL (ref 0.1–1.0)
Monocytes Relative: 2 %
Neutro Abs: 9.3 10*3/uL — ABNORMAL HIGH (ref 1.7–7.7)
Neutrophils Relative %: 83 %
Platelets: 362 10*3/uL (ref 150–400)
RBC: 4.79 MIL/uL (ref 3.87–5.11)
RDW: 12.9 % (ref 11.5–15.5)
WBC: 11.1 10*3/uL — ABNORMAL HIGH (ref 4.0–10.5)
nRBC: 0 % (ref 0.0–0.2)

## 2023-03-18 LAB — URINALYSIS, ROUTINE W REFLEX MICROSCOPIC
Bilirubin Urine: NEGATIVE
Glucose, UA: NEGATIVE mg/dL
Hgb urine dipstick: NEGATIVE
Ketones, ur: NEGATIVE mg/dL
Leukocytes,Ua: NEGATIVE
Nitrite: NEGATIVE
Protein, ur: NEGATIVE mg/dL
Specific Gravity, Urine: 1.01 (ref 1.005–1.030)
pH: 6.5 (ref 5.0–8.0)

## 2023-03-18 LAB — POC URINE PREG, ED: Preg Test, Ur: NEGATIVE

## 2023-03-18 MED ORDER — IBUPROFEN 800 MG PO TABS
800.0000 mg | ORAL_TABLET | Freq: Once | ORAL | Status: AC
  Administered 2023-03-18: 800 mg via ORAL
  Filled 2023-03-18: qty 1

## 2023-03-18 MED ORDER — GADOBUTROL 1 MMOL/ML IV SOLN
10.0000 mL | Freq: Once | INTRAVENOUS | Status: AC | PRN
  Administered 2023-03-18: 10 mL via INTRAVENOUS

## 2023-03-18 MED ORDER — MORPHINE SULFATE (PF) 4 MG/ML IV SOLN
4.0000 mg | Freq: Once | INTRAVENOUS | Status: AC
  Administered 2023-03-18: 4 mg via INTRAVENOUS
  Filled 2023-03-18: qty 1

## 2023-03-18 MED ORDER — CYCLOBENZAPRINE HCL 10 MG PO TABS
10.0000 mg | ORAL_TABLET | Freq: Once | ORAL | Status: AC
  Administered 2023-03-18: 10 mg via ORAL
  Filled 2023-03-18: qty 1

## 2023-03-18 MED ORDER — CYCLOBENZAPRINE HCL 10 MG PO TABS: 10.0000 mg | ORAL_TABLET | Freq: Two times a day (BID) | ORAL | 0 refills | Status: DC | PRN

## 2023-03-18 MED ORDER — ACETAMINOPHEN 500 MG PO TABS: 1000.0000 mg | ORAL_TABLET | Freq: Four times a day (QID) | ORAL | 0 refills | Status: AC | PRN

## 2023-03-18 NOTE — Discharge Instructions (Addendum)
You have been evaluated for your symptoms.  Fortunately MRI today did not show any evidence of cauda equina or any other concerning finding.  Please take muscle accident and Tylenol as needed for pain.  Call and follow-up closely with your orthopedic specialist for further care.  Your urine today did not show any signs of urinary tract infection.

## 2023-03-18 NOTE — ED Notes (Signed)
Pt incontinent of UA, full linen change performed

## 2023-03-18 NOTE — ED Notes (Signed)
Patient transported to MRI 

## 2023-03-18 NOTE — ED Triage Notes (Addendum)
Pt BIB EMS due to back pain and bilateral leg pain, hx of back surgery in 2020. Episodes have happened before and usually go away sx have worsened in the past 2 weeks. Pt states she has loss of bowel/bladder function. Has taken tramadol and prednisone, pt states does not help. Axox4.VSS.

## 2023-03-18 NOTE — ED Provider Notes (Signed)
[place] EMERGENCY DEPARTMENT AT [facility] Provider Note   CSN: [id number] Arrival date & time: 03/18/23  0757     History  Chief Complaint  Patient presents with   Back Pain    Carolyn Castro is a 42 y.o. female.  The history is provided by the patient and medical records. No language interpreter was used.  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  Patient report for the past several weeks she has had progressive worsening pain to her low back..  She described pain as a sharp stabbing sensation going across her back and hips and now radiates down to both legs.  She also endorsed feeling constipated, time having urinary incontinence, and now having both numbness and weakness to both legs.  Patient states she been started on a course of steroids and tramadol and has finished the medication but report no relief of her symptoms.  She also mention having an MRI of her lumbar spine 6 days ago through [facility] orthopedic but have not got the result.  Due to progressive worsening symptoms she presented to ED for further care.  She does not endorse any fever or chills no dysuria vaginal bleeding or vaginal discharge.  No prior history of IV drug use or active cancer.  Home Medications Prior to Admission medications   Medication Sig Start Date End Date Taking? Authorizing Provider  ALPRAZolam (XANAX) 1 MG tablet TAKE 1 TABLET(1 MG) BY MOUTH THREE TIMES DAILY AS NEEDED FOR ANXIETY 01/26/23   Cottle, Steva Ready., MD  amLODipine (NORVASC) 10 MG tablet Take 10 mg by mouth daily.    [provider]  azelastine (ASTELIN) 0.1 % nasal spray INSTILL 2 SPRAYS INTO BOTH NOSTRILS TWICE DAILY 10/11/21   Cobb, Ruby Cola, NP  bacitracin ointment Apply 1 application. topically 2 (two) times daily. 11/15/21   Derwood Kaplan, MD  benzonatate  (TESSALON) 100 MG capsule Take 1 capsule (100 mg total) by mouth 3 (three) times daily as needed for cough. 01/18/23   Glenford Bayley, NP  Budeson-Glycopyrrol-Formoterol (BREZTRI AEROSPHERE) 160-9-4.8 MCG/ACT AERO Inhale 2 puffs into the lungs in the morning and at bedtime. 08/03/21   Cobb, Ruby Cola, NP  CRANBERRY PO Take 1 capsule by mouth in the morning and at bedtime.    [provider]  cyclobenzaprine (FLEXERIL) 10 MG tablet Take 1 tablet (10 mg total) by mouth 2 (two) times daily as needed for muscle spasms. 08/26/19   Luevenia Maxin, Mina A, PA-C  doxycycline (VIBRA-TABS) 100 MG tablet Take 1 tablet (100 mg total) by mouth 2 (two) times daily. 01/18/23   Glenford Bayley, NP  fexofenadine (ALLEGRA) 180 MG tablet Take 180 mg by mouth daily.    [provider]  gabapentin (NEURONTIN) 300 MG capsule Take 1 capsule (300 mg total) by mouth nightly as needed. 02/25/22     influenza vac split quadrivalent PF (FLUARIX) 0.5 ML injection Inject into the muscle. 04/14/22   Judyann Munson, MD  levothyroxine (SYNTHROID) 75 MCG tablet Take 75 mcg by mouth daily before breakfast.    [provider]  metFORMIN (GLUCOPHAGE-XR) 500 MG 24 hr tablet Take 500 mg by mouth 2 (two) times daily. 01/15/23   [provider]  montelukast (SINGULAIR) 10 MG tablet Take 10 mg by mouth at bedtime.    [provider]  montelukast (SINGULAIR)  10 MG tablet Take 1 tablet (10 mg total) by mouth every evening. 04/01/22     Multiple Vitamins-Minerals (MULTIVITAMIN WOMEN) TABS Take 1 tablet by mouth daily.    [provider]  norethindrone-ethinyl estradiol (LOESTRIN) 1-20 MG-MCG tablet Take 1 tablet by mouth daily. 03/29/21   [provider]  Olopatadine-Mometasone (Cristal Generous) [phone] MCG/ACT SUSP Place 2 sprays into the nose 2 (two) times daily. 03/04/22   Marcelyn Bruins, MD  omeprazole (PRILOSEC) 20 MG capsule Take 20 mg by mouth 2 (two) times daily. 05/10/21    [provider]  ondansetron (ZOFRAN) 4 MG tablet Take 4 mg by mouth every 8 (eight) hours as needed for nausea or vomiting.    [provider]  ondansetron (ZOFRAN-ODT) 4 MG disintegrating tablet Take 1 tablet (4 mg total) by mouth every 8 (eight) hours as needed for nausea 04/05/22     PARoxetine (PAXIL) 30 MG tablet Take 2 tablets (60 mg total) by mouth at bedtime. 03/09/23   Cottle, Steva Ready., MD  phentermine (ADIPEX-P) 37.5 MG tablet Take 37.5 mg by mouth daily before breakfast. 1/2 tablet    [provider]  predniSONE (DELTASONE) 10 MG tablet 4 tabs for 2 days, then 3 tabs for 2 days, 2 tabs for 2 days, then 1 tab for 2 days, then stop 01/18/23   Glenford Bayley, NP  Probiotic Product (PROBIOTIC PO) Take 1 capsule by mouth daily at 12 noon.    [provider]  Semaglutide-Weight Management (WEGOVY) 1 MG/0.5ML SOAJ Inject 1 mg into the skin once a week Patient not taking: Reported on 03/09/2023 05/04/22     Semaglutide-Weight Management (WEGOVY) 2.4 MG/0.75ML SOAJ Inject 2.4 mg into the skin once a week. Patient not taking: Reported on 03/09/2023 09/12/22     Semaglutide-Weight Management 1.7 MG/0.75ML SOAJ Inject 1.7 mg into the skin once weekly. Patient not taking: Reported on 03/09/2023 06/15/22     Semaglutide-Weight Management 2.4 MG/0.75ML SOAJ Inject 2.4 mg into the skin once a week. Patient not taking: Reported on 03/09/2023 10/03/22     topiramate (TOPAMAX) 50 MG tablet Take 50 mg by mouth daily. 01/15/23   [provider]  traZODone (DESYREL) 150 MG tablet Take 1 tablet (150 mg total) by mouth at bedtime. 03/09/23   Cottle, Steva Ready., MD      Allergies    Ciprofloxacin hcl, Fluticasone propionate, and Triamcinolone acetonide    Review of Systems   Review of Systems  Musculoskeletal:  Positive for back pain.  All other systems reviewed and are negative.   Physical Exam Updated Vital Signs BP 135/83 (BP Location: Right Arm)   Pulse 87    Temp 98.6 F (37 C)   Resp 15   Ht 5\' 4"  (1.626 m)   Wt 120.2 kg   SpO2 100%   BMI 45.49 kg/m  Physical Exam Vitals and nursing note reviewed.  Constitutional:      General: She is not in acute distress.    Appearance: She is well-developed. She is obese.  HENT:     Head: Atraumatic.  Eyes:     Conjunctiva/sclera: Conjunctivae normal.  Pulmonary:     Effort: Pulmonary effort is normal.  Abdominal:     Palpations: Abdomen is soft.  Musculoskeletal:        General: Tenderness (tenderness to lumbar and paralumbar spinal muscles.  no overlying skin changes) present.     Cervical back: Neck supple.  Skin:    Findings: No  rash.  Neurological:     Mental Status: She is alert.     Comments: 3/5 strengths to BLE with decreased sensation but normal patella DTR bilaterally and intact pedal pulses.  Very poor effort.   Psychiatric:        Mood and Affect: Mood normal.     ED Results / Procedures / Treatments   Labs (all labs ordered are listed, but only abnormal results are displayed) Labs Reviewed  BASIC METABOLIC PANEL - Abnormal; Notable for the following components:      Result Value   CO2 21 (*)    Glucose, Bld 104 (*)    Creatinine, Ser 1.13 (*)    All other components within normal limits  CBC WITH DIFFERENTIAL/PLATELET - Abnormal; Notable for the following components:   WBC 11.1 (*)    Neutro Abs 9.3 (*)    All other components within normal limits  URINALYSIS, ROUTINE W REFLEX MICROSCOPIC  POC URINE PREG, ED    EKG None  Radiology MR Lumbar Spine W Wo Contrast  Result Date: 03/18/2023 CLINICAL DATA:  i low back pain. Cauda equina syndrome suspected. Recurrent low back and bilateral lower extremity pain. Symptoms have progressed over the last 2 weeks. Patient now reports loss of bowel and bladder function. EXAM: MRI LUMBAR SPINE WITHOUT AND WITH CONTRAST TECHNIQUE: Multiplanar and multiecho pulse sequences of the lumbar spine were obtained without and with  intravenous contrast. CONTRAST:  10mL GADAVIST GADOBUTROL 1 MMOL/ML IV SOLN COMPARISON:  MRI of lumbar spine 03/13/2023 at [facility]. FINDINGS: Segmentation: 5 non rib-bearing lumbar type vertebral bodies are present. The lowest fully formed vertebral body is L5. Alignment: Slight retrolisthesis is present at L4-5, associated with bilateral pars defects at L5. Vertebrae: Type 2 Modic changes are again noted at L5-S1 following fusion. Conus medullaris and cauda equina: Conus extends to the T12 level. Conus and cauda equina appear normal. Paraspinal and other soft tissues: The urinary bladder is moderately distended scratched at the urinary bladder is distended to the level of L4. No discrete lesions are associated. The visualized abdomen is otherwise unremarkable. No hydronephrosis is present. Disc levels: Disc levels are normal at L2-3 and above. L3-4: Slight mild disc bulging and desiccation is present without focal stenosis. L4-5: No significant disc protrusion or stenosis is present. The central canal and foramina are patent bilaterally. L5-S1: The level is somewhat distorted by fusion hardware. No residual or recurrent stenosis is present. IMPRESSION: 1. Status post fusion at L5-S1 without residual or recurrent stenosis. 2. Slight mild disc bulging and desiccation at L3-4 without focal stenosis. 3. Distended urinary bladder to the level of L4. No discrete lesions are associated. Electronically Signed   By: Marin Roberts M.D.   On: 03/18/2023 11:43    Procedures Procedures    Medications Ordered in ED Medications  cyclobenzaprine (FLEXERIL) tablet 10 mg (10 mg Oral Given 03/18/23 0919)  ibuprofen (ADVIL) tablet 800 mg (800 mg Oral Given 03/18/23 0919)  morphine (PF) 4 MG/ML injection 4 mg (4 mg Intravenous Given 03/18/23 0951)  gadobutrol (GADAVIST) 1 MMOL/ML injection 10 mL (10 mLs Intravenous Contrast Given 03/18/23 1124)    ED Course/ Medical Decision Making/ A&P                                  Medical Decision Making Amount and/or Complexity of Data Reviewed Labs: ordered. Radiology: ordered.  Risk OTC drugs. Prescription drug management.  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  Patient report for the past several weeks she has had progressive worsening pain to her low back..  She described pain as a sharp stabbing sensation going across her back and hips and now radiates down to both legs.  She also endorsed feeling constipated, time having urinary incontinence, and now having both numbness and weakness to both legs.  Patient states she been started on a course of steroids and tramadol and has finished the medication but report no relief of her symptoms.  She also mention having an MRI of her lumbar spine 6 days ago through [facility] orthopedic but have not got the result.  Due to progressive worsening symptoms she presented to ED for further care.  She does not endorse any fever or chills no dysuria vaginal bleeding or vaginal discharge.  No prior history of IV drug use or active cancer.  On exam this is an obese female laying flat on her bed in no acute discomfort.  Pt does have ttp to her lumbar spine and paraspinal muscle with decreased subjective sensation to BLE and poor strength and poor effort.  Reflex is intact and legs are well perfused.   EMR review patient was seen at an urgent care center on 96/12/2022 for same presentation.  At that time an x-ray of the lumbar spine was performed independently viewed interpreted by me and without any acute finding.  Patient was subsequently discharged home with supportive care.  Patient also mention she had an MRI done less than a week ago.  I am unable to  her locate the results of this MRI.  I have reached out to patient's orthopedist and now awaits callback.  9:40 AM I was able to consult on-call orthopedist Dr. Carola Frost who also takes call for [facility].  He recommended for me to reach out to [facility] to require access to patient's recent MRI.  I did reach out to the urgent care center and spoke with one of the staff.  She did confirm that patient did have an MRI done however the result is not available as it has not been read yet.  In the setting of patient having progressive worsening low back pain with cauda equina syndrome, I felt patient will need to have a repeat MRI to rule out cauda equina.  I gave patient supportive care and will obtain MRI.  Care discussed with DR. Pfeiffer.   Nurse report patient had urinary continence and urinate on herself and at this time we are unable to obtain a UA.  I will repeat an MRI.  Patient is made aware and agrees to plan.  -Labs ordered, independently viewed and interpreted by me.  Labs remarkable for reassuring labs.  UA pending -The patient was maintained on a cardiac monitor.  I personally viewed and interpreted the cardiac monitored which showed an underlying rhythm of: NSR -Imaging independently viewed and interpreted by me and I agree with radiologist's interpretation.  Result remarkable for MRI lspine without acute changes, no caudal equina.  Bladder is distended.  A bladder scan performed showing 437cc of urine.  Pt however able to urinate more than 400cc of urine afterward.  She has intact rectal tone, no stool burden, no stool impaction -This patient presents to the ED for concern of back pain, this involves an extensive number of treatment options, and is a complaint that carries with it a high risk of complications and morbidity.  The differential diagnosis includes caudal equina, discitis, constipation, strain, sprain, disc herniation, hypothyroidism -Co morbidities that  complicate the patient evaluation includes obesity, anxiety, HTN, thyroid disease -Treatment includes morphine, flexeril, ibuprofen -Reevaluation of the patient after these medicines showed that the patient improved -PCP office notes or outside notes reviewed -Discussion with speecialist orthopedist Dr. Carola Frost -Escalation to admission/observation considered: patients feels much better, is comfortable with discharge, and will follow up with PCP -Prescription medication considered, patient comfortable with tylenol and flexeril -Social Determinant of Health considered          Final Clinical Impression(s) / ED Diagnoses Final diagnoses:  Chronic bilateral low back pain with bilateral sciatica    Rx / DC Orders ED Discharge Orders          Ordered    cyclobenzaprine (FLEXERIL) 10 MG tablet  2 times daily PRN        03/18/23 1243    acetaminophen (TYLENOL) 500 MG tablet  Every 6 hours PRN        03/18/23 1243              Fayrene Helper, PA-C 03/18/23 1519    Arby Barrette, MD 03/18/23 1626

## 2023-03-31 ENCOUNTER — Other Ambulatory Visit: Payer: Self-pay | Admitting: Psychiatry

## 2023-03-31 DIAGNOSIS — F4001 Agoraphobia with panic disorder: Secondary | ICD-10-CM

## 2023-04-03 ENCOUNTER — Other Ambulatory Visit: Payer: Self-pay | Admitting: Neurosurgery

## 2023-04-03 DIAGNOSIS — R29898 Other symptoms and signs involving the musculoskeletal system: Secondary | ICD-10-CM

## 2023-04-15 ENCOUNTER — Ambulatory Visit
Admission: RE | Admit: 2023-04-15 | Discharge: 2023-04-15 | Disposition: A | Discharge status: Home / Self Care | Payer: BC Managed Care – PPO | Source: Ambulatory Visit | Attending: Neurosurgery | Admitting: Neurosurgery

## 2023-04-15 DIAGNOSIS — R29898 Other symptoms and signs involving the musculoskeletal system: Secondary | ICD-10-CM

## 2023-04-25 ENCOUNTER — Other Ambulatory Visit: Payer: Self-pay | Admitting: Obstetrics and Gynecology

## 2023-04-25 DIAGNOSIS — Z1231 Encounter for screening mammogram for malignant neoplasm of breast: Secondary | ICD-10-CM

## 2023-05-07 ENCOUNTER — Other Ambulatory Visit: Payer: Self-pay

## 2023-05-07 ENCOUNTER — Emergency Department (HOSPITAL_BASED_OUTPATIENT_CLINIC_OR_DEPARTMENT_OTHER): Payer: BC Managed Care – PPO

## 2023-05-07 ENCOUNTER — Emergency Department (HOSPITAL_BASED_OUTPATIENT_CLINIC_OR_DEPARTMENT_OTHER)
Admission: EM | Admit: 2023-05-07 | Discharge: 2023-05-07 | Disposition: A | Discharge status: Home / Self Care | Payer: BC Managed Care – PPO | Attending: Emergency Medicine | Admitting: Emergency Medicine

## 2023-05-07 ENCOUNTER — Encounter (HOSPITAL_BASED_OUTPATIENT_CLINIC_OR_DEPARTMENT_OTHER): Payer: Self-pay

## 2023-05-07 DIAGNOSIS — R1084 Generalized abdominal pain: Secondary | ICD-10-CM | POA: Diagnosis not present

## 2023-05-07 DIAGNOSIS — R109 Unspecified abdominal pain: Secondary | ICD-10-CM

## 2023-05-07 LAB — COMPREHENSIVE METABOLIC PANEL
ALT: 32 U/L (ref 0–44)
AST: 23 U/L (ref 15–41)
Albumin: 3.5 g/dL (ref 3.5–5.0)
Alkaline Phosphatase: 39 U/L (ref 38–126)
Anion gap: 14 (ref 5–15)
BUN: 19 mg/dL (ref 6–20)
CO2: 21 mmol/L — ABNORMAL LOW (ref 22–32)
Calcium: 9.2 mg/dL (ref 8.9–10.3)
Chloride: 104 mmol/L (ref 98–111)
Creatinine, Ser: 1.19 mg/dL — ABNORMAL HIGH (ref 0.44–1.00)
GFR, Estimated: 59 mL/min — ABNORMAL LOW (ref >60)
Glucose, Bld: 103 mg/dL — ABNORMAL HIGH (ref 70–99)
Potassium: 3.9 mmol/L (ref 3.5–5.1)
Sodium: 139 mmol/L (ref 135–145)
Total Bilirubin: 0.6 mg/dL (ref 0.3–1.2)
Total Protein: 6.7 g/dL (ref 6.5–8.1)

## 2023-05-07 LAB — LIPASE, BLOOD: Lipase: 48 U/L (ref 11–51)

## 2023-05-07 LAB — URINALYSIS, ROUTINE W REFLEX MICROSCOPIC
Bilirubin Urine: NEGATIVE
Glucose, UA: NEGATIVE mg/dL
Hgb urine dipstick: NEGATIVE
Ketones, ur: NEGATIVE mg/dL
Leukocytes,Ua: NEGATIVE
Nitrite: NEGATIVE
Protein, ur: NEGATIVE mg/dL
Specific Gravity, Urine: 1.025 (ref 1.005–1.030)
pH: 6 (ref 5.0–8.0)

## 2023-05-07 LAB — CBC
HCT: 39.8 % (ref 36.0–46.0)
Hemoglobin: 13.3 g/dL (ref 12.0–15.0)
MCH: 31.1 pg (ref 26.0–34.0)
MCHC: 33.4 g/dL (ref 30.0–36.0)
MCV: 93.2 fL (ref 80.0–100.0)
Platelets: 373 10*3/uL (ref 150–400)
RBC: 4.27 MIL/uL (ref 3.87–5.11)
RDW: 12.7 % (ref 11.5–15.5)
WBC: 5.8 10*3/uL (ref 4.0–10.5)
nRBC: 0 % (ref 0.0–0.2)

## 2023-05-07 LAB — PREGNANCY, URINE: Preg Test, Ur: NEGATIVE

## 2023-05-07 MED ORDER — IOHEXOL 300 MG/ML  SOLN
75.0000 mL | Freq: Once | INTRAMUSCULAR | Status: AC | PRN
  Administered 2023-05-07: 75 mL via INTRAVENOUS

## 2023-05-07 MED ORDER — ONDANSETRON HCL 4 MG/2ML IJ SOLN
4.0000 mg | Freq: Once | INTRAMUSCULAR | Status: AC | PRN
  Administered 2023-05-07: 4 mg via INTRAVENOUS
  Filled 2023-05-07: qty 2

## 2023-05-07 MED ORDER — DICYCLOMINE HCL 20 MG PO TABS: 20.0000 mg | ORAL_TABLET | Freq: Two times a day (BID) | ORAL | 0 refills | Status: DC

## 2023-05-07 MED ORDER — ONDANSETRON HCL 4 MG PO TABS: 4.0000 mg | ORAL_TABLET | Freq: Three times a day (TID) | ORAL | 0 refills | Status: DC | PRN

## 2023-05-07 NOTE — ED Provider Notes (Signed)
[place] [facility] Provider Note   CSN: [id number] Arrival date & time: 05/07/23  1660     History  Chief Complaint  Patient presents with   Abdominal Pain    Carolyn Castro is a 42 y.o. female presenting to ED with abdominal pain.  Reports onset 2 weeks ago.  Cramping diffuse abdominal pain, worse after eating solid food, triggering loose/liquid BM's.  Reports feeling thirsty.  Drinking water.  Denies hx of bowel issues or abdominal surgeries.  Denies hx of sick contacts, travel outside country, drinking unfiltered water, or recent antibiotic use.    Using zofran at home for nausea with some minimal relief.  HPI     Home Medications Prior to Admission medications   Medication Sig Start Date End Date Taking? Authorizing Provider  dicyclomine (BENTYL) 20 MG tablet Take 1 tablet (20 mg total) by mouth 2 (two) times daily for 30 doses. 05/07/23 05/22/23 Yes Tyquez Hollibaugh, Kermit Balo, MD  ondansetron (ZOFRAN) 4 MG tablet Take 1 tablet (4 mg total) by mouth every 8 (eight) hours as needed for up to 15 doses for nausea or vomiting. 05/07/23  Yes Madix Blowe, Kermit Balo, MD  acetaminophen (TYLENOL) 500 MG tablet Take 2 tablets (1,000 mg total) by mouth every 6 (six) hours as needed for moderate pain. 03/18/23   Fayrene Helper, PA-C  ALPRAZolam Prudy Feeler) 1 MG tablet Take 1 tablet (1 mg total) by mouth 2 (two) times daily as needed for anxiety. 03/31/23   Cottle, Steva Ready., MD  amLODipine (NORVASC) 10 MG tablet Take 10 mg by mouth daily.    [provider]  azelastine (ASTELIN) 0.1 % nasal spray INSTILL 2 SPRAYS INTO BOTH NOSTRILS TWICE DAILY 10/11/21   Cobb, Ruby Cola, NP  bacitracin ointment Apply 1 application. topically 2 (two) times daily. 11/15/21   Derwood Kaplan, MD  benzonatate (TESSALON) 100 MG capsule Take 1 capsule (100 mg total) by mouth 3 (three) times daily as needed for cough. 01/18/23   Glenford Bayley, NP  Budeson-Glycopyrrol-Formoterol  (BREZTRI AEROSPHERE) 160-9-4.8 MCG/ACT AERO Inhale 2 puffs into the lungs in the morning and at bedtime. 08/03/21   Cobb, Ruby Cola, NP  CRANBERRY PO Take 1 capsule by mouth in the morning and at bedtime.    [provider]  cyclobenzaprine (FLEXERIL) 10 MG tablet Take 1 tablet (10 mg total) by mouth 2 (two) times daily as needed for muscle spasms. 03/18/23   Fayrene Helper, PA-C  doxycycline (VIBRA-TABS) 100 MG tablet Take 1 tablet (100 mg total) by mouth 2 (two) times daily. 01/18/23   Glenford Bayley, NP  fexofenadine (ALLEGRA) 180 MG tablet Take 180 mg by mouth daily.    [provider]  gabapentin (NEURONTIN) 300 MG capsule Take 1 capsule (300 mg total) by mouth nightly as needed. 02/25/22     influenza vac split quadrivalent PF (FLUARIX) 0.5 ML injection Inject into the muscle. 04/14/22   Judyann Munson, MD  levothyroxine (SYNTHROID) 75 MCG tablet Take 75 mcg by mouth daily before breakfast.    [provider]  metFORMIN (GLUCOPHAGE-XR) 500 MG 24 hr tablet Take 500 mg by mouth 2 (two) times daily. 01/15/23   [provider]  montelukast (SINGULAIR) 10 MG tablet Take 10 mg by mouth at bedtime.    [provider]  montelukast (SINGULAIR) 10 MG tablet Take 1 tablet (10 mg total) by mouth every evening. 04/01/22     Multiple Vitamins-Minerals (MULTIVITAMIN WOMEN) TABS Take 1  tablet by mouth daily.    [provider]  norethindrone-ethinyl estradiol (LOESTRIN) 1-20 MG-MCG tablet Take 1 tablet by mouth daily. 03/29/21   [provider]  Olopatadine-Mometasone (Cristal Generous) [phone] MCG/ACT SUSP Place 2 sprays into the nose 2 (two) times daily. 03/04/22   Marcelyn Bruins, MD  omeprazole (PRILOSEC) 20 MG capsule Take 20 mg by mouth 2 (two) times daily. 05/10/21   [provider]  ondansetron (ZOFRAN) 4 MG tablet Take 4 mg by mouth every 8 (eight) hours as needed for nausea or vomiting.    [provider]  ondansetron  (ZOFRAN-ODT) 4 MG disintegrating tablet Take 1 tablet (4 mg total) by mouth every 8 (eight) hours as needed for nausea 04/05/22     PARoxetine (PAXIL) 30 MG tablet Take 2 tablets (60 mg total) by mouth at bedtime. 03/09/23   Cottle, Steva Ready., MD  phentermine (ADIPEX-P) 37.5 MG tablet Take 37.5 mg by mouth daily before breakfast. 1/2 tablet    [provider]  predniSONE (DELTASONE) 10 MG tablet 4 tabs for 2 days, then 3 tabs for 2 days, 2 tabs for 2 days, then 1 tab for 2 days, then stop 01/18/23   Glenford Bayley, NP  Probiotic Product (PROBIOTIC PO) Take 1 capsule by mouth daily at 12 noon.    [provider]  Semaglutide-Weight Management (WEGOVY) 1 MG/0.5ML SOAJ Inject 1 mg into the skin once a week Patient not taking: Reported on 03/09/2023 05/04/22     Semaglutide-Weight Management (WEGOVY) 2.4 MG/0.75ML SOAJ Inject 2.4 mg into the skin once a week. Patient not taking: Reported on 03/09/2023 09/12/22     Semaglutide-Weight Management 1.7 MG/0.75ML SOAJ Inject 1.7 mg into the skin once weekly. Patient not taking: Reported on 03/09/2023 06/15/22     Semaglutide-Weight Management 2.4 MG/0.75ML SOAJ Inject 2.4 mg into the skin once a week. Patient not taking: Reported on 03/09/2023 10/03/22     topiramate (TOPAMAX) 50 MG tablet Take 50 mg by mouth daily. 01/15/23   [provider]  traZODone (DESYREL) 150 MG tablet Take 1 tablet (150 mg total) by mouth at bedtime. 03/09/23   Cottle, Steva Ready., MD      Allergies    Ciprofloxacin hcl, Fluticasone propionate, and Triamcinolone acetonide    Review of Systems   Review of Systems  Physical Exam Updated Vital Signs BP (!) 148/92   Pulse 100   Temp 98.1 F (36.7 C) (Oral)   Resp 18   Ht 5\' 4"  (1.626 m)   Wt 117.9 kg   SpO2 100%   BMI 44.63 kg/m  Physical Exam Constitutional:      General: She is not in acute distress.    Appearance: She is obese.  HENT:     Head: Normocephalic and atraumatic.  Eyes:      Conjunctiva/sclera: Conjunctivae normal.     Pupils: Pupils are equal, round, and reactive to light.  Cardiovascular:     Rate and Rhythm: Normal rate and regular rhythm.  Pulmonary:     Effort: Pulmonary effort is normal. No respiratory distress.  Abdominal:     General: There is no distension.     Tenderness: There is generalized abdominal tenderness.  Skin:    General: Skin is warm and dry.  Neurological:     General: No focal deficit present.     Mental Status: She is alert. Mental status is at baseline.  Psychiatric:        Mood and Affect:  Mood normal.        Behavior: Behavior normal.     ED Results / Procedures / Treatments   Labs (all labs ordered are listed, but only abnormal results are displayed) Labs Reviewed  COMPREHENSIVE METABOLIC PANEL - Abnormal; Notable for the following components:      Result Value   CO2 21 (*)    Glucose, Bld 103 (*)    Creatinine, Ser 1.19 (*)    GFR, Estimated 59 (*)    All other components within normal limits  LIPASE, BLOOD  CBC  URINALYSIS, ROUTINE W REFLEX MICROSCOPIC  PREGNANCY, URINE    EKG None  Radiology CT ABDOMEN PELVIS W CONTRAST  Result Date: 05/07/2023 CLINICAL DATA:  Generalized abdominal pain with nausea and diarrhea. EXAM: CT ABDOMEN AND PELVIS WITH CONTRAST TECHNIQUE: Multidetector CT imaging of the abdomen and pelvis was performed using the standard protocol following bolus administration of intravenous contrast. RADIATION DOSE REDUCTION: This exam was performed according to the departmental dose-optimization program which includes automated exposure control, adjustment of the mA and/or kV according to patient size and/or use of iterative reconstruction technique. CONTRAST:  75mL OMNIPAQUE IOHEXOL 300 MG/ML  SOLN COMPARISON:  10/28/2020 FINDINGS: Lower chest: Unremarkable. Hepatobiliary: No suspicious focal abnormality within the liver parenchyma. Small area of low attenuation in the anterior liver, adjacent to the  falciform ligament, is in a characteristic location for focal fatty deposition. There is no evidence for gallstones, gallbladder wall thickening, or pericholecystic fluid. No intrahepatic or extrahepatic biliary dilation. Pancreas: No focal mass lesion. No dilatation of the main duct. No intraparenchymal cyst. No peripancreatic edema. Spleen: No splenomegaly. No suspicious focal mass lesion. Adrenals/Urinary Tract: No adrenal nodule or mass. Kidneys unremarkable. No evidence for hydroureter. The urinary bladder appears normal for the degree of distention. Stomach/Bowel: Stomach is unremarkable. No gastric wall thickening. No evidence of outlet obstruction. Duodenum is normally positioned as is the ligament of Treitz. No small bowel wall thickening. No small bowel dilatation. The terminal ileum is normal. The appendix is normal. No gross colonic mass. No colonic wall thickening. Vascular/Lymphatic: No abdominal aortic aneurysm. No abdominal aortic atherosclerotic calcification. There is no gastrohepatic or hepatoduodenal ligament lymphadenopathy. No retroperitoneal or mesenteric lymphadenopathy. No pelvic sidewall lymphadenopathy. Reproductive: Unremarkable. Other: No intraperitoneal free fluid. Musculoskeletal: No worrisome lytic or sclerotic osseous abnormality. Lumbosacral fusion hardware evident. IMPRESSION: No acute findings in the abdomen or pelvis. Specifically, no findings to explain the patient's history of abdominal pain with nausea and diarrhea. Electronically Signed   By: Kennith Center M.D.   On: 05/07/2023 08:22    Procedures Procedures    Medications Ordered in ED Medications  ondansetron [facility]) injection 4 mg (4 mg Intravenous Given 05/07/23 0659)  iohexol (OMNIPAQUE) 300 MG/ML solution 75 mL (75 mLs Intravenous Contrast Given 05/07/23 0800)    ED Course/ Medical Decision Making/ A&P                                 Medical Decision Making Amount and/or Complexity of Data  Reviewed Labs: ordered. Radiology: ordered.  Risk Prescription drug management.   This patient presents to the ED with concern for generalized abdominal pain, diarrhea, cramping. This involves an extensive number of treatment options, and is a complaint that carries with it a high risk of complications and morbidity.  The differential diagnosis includes colitis vs IBS vs IBD vs biliary disease vs pancreatitis vs other   I  ordered and personally interpreted labs.  The pertinent results include: No emergent findings  I ordered imaging studies including CT abdomen pelvis with contrast I independently visualized and interpreted imaging which showed incidental potential fatty liver, no emergent findings I agree with the radiologist interpretation  The patient was maintained on a cardiac monitor.   I have reviewed the patients home medicines and have made adjustments as needed  Test Considered: Low suspicion for pelvic pathology, no indication for pelvic ultrasound   After the interventions noted above, I reevaluated the patient and found that they have: stayed the same   Dispostion:  After consideration of the diagnostic results and the patients response to treatment, I feel that the patent would benefit from outpatient follow-up.  At this point the patient mains clinically well-appearing and does not have evidence clinically of dehydration, sepsis, or other life-threatening condition.  I do not see indication for hospitalization.  She is comfortable going home.  We will try dicyclomine as well as Zofran for her nausea and cramping abdominal pain.  I suspect this may be an IBS type issue.  She does see [facility], Dr Bosie Clos - and will call to schedule an appointment with them.         Final Clinical Impression(s) / ED Diagnoses Final diagnoses:  Abdominal pain, unspecified abdominal location    Rx / DC Orders ED Discharge Orders          Ordered    dicyclomine (BENTYL) 20 MG  tablet  2 times daily        05/07/23 0832    ondansetron (ZOFRAN) 4 MG tablet  Every 8 hours PRN        05/07/23 0832              Terald Sleeper, MD 05/07/23 [phone]

## 2023-05-07 NOTE — ED Triage Notes (Signed)
Pt reports generalized abdominal pain, diarrhea and nausea for the last 2 weeks. States she noticed blood in stool over the last 3 days. Took zofran at home w no relief. Reports feeling "a stone in the middle of stomach"

## 2023-05-24 ENCOUNTER — Ambulatory Visit
Admission: RE | Admit: 2023-05-24 | Discharge: 2023-05-24 | Disposition: A | Discharge status: Home / Self Care | Payer: BC Managed Care – PPO | Source: Ambulatory Visit | Attending: Obstetrics and Gynecology | Admitting: Obstetrics and Gynecology

## 2023-05-24 DIAGNOSIS — Z1231 Encounter for screening mammogram for malignant neoplasm of breast: Secondary | ICD-10-CM

## 2023-05-29 ENCOUNTER — Other Ambulatory Visit: Payer: Self-pay | Admitting: Obstetrics and Gynecology

## 2023-05-29 DIAGNOSIS — R928 Other abnormal and inconclusive findings on diagnostic imaging of breast: Secondary | ICD-10-CM

## 2023-06-07 ENCOUNTER — Telehealth: Payer: Self-pay | Admitting: *Deleted

## 2023-06-07 NOTE — Telephone Encounter (Signed)
Received medication list from patient in the mail.  There were some discrepancies from the patient list and list from last visit from 01/18/2023.  Patient list states Symbicort, list in Epic states [place], patient states Omeprazole 40 mg twice daily and Epic states 20 mg twice daily.  Omeprazole updated in Epic.  Albuterol inhaler added to list in Epic.  Medications updated in Epic.

## 2023-06-19 ENCOUNTER — Ambulatory Visit
Admission: RE | Admit: 2023-06-19 | Discharge: 2023-06-19 | Disposition: A | Discharge status: Home / Self Care | Payer: BC Managed Care – PPO | Source: Ambulatory Visit | Attending: Obstetrics and Gynecology | Admitting: Obstetrics and Gynecology

## 2023-06-19 ENCOUNTER — Other Ambulatory Visit: Payer: Self-pay | Admitting: Obstetrics and Gynecology

## 2023-06-19 DIAGNOSIS — N6489 Other specified disorders of breast: Secondary | ICD-10-CM

## 2023-06-19 DIAGNOSIS — R928 Other abnormal and inconclusive findings on diagnostic imaging of breast: Secondary | ICD-10-CM

## 2023-06-19 DIAGNOSIS — N6310 Unspecified lump in the right breast, unspecified quadrant: Secondary | ICD-10-CM

## 2023-06-23 ENCOUNTER — Emergency Department (HOSPITAL_COMMUNITY): Payer: BC Managed Care – PPO | Admitting: Anesthesiology

## 2023-06-23 ENCOUNTER — Other Ambulatory Visit: Payer: Self-pay

## 2023-06-23 ENCOUNTER — Emergency Department (HOSPITAL_BASED_OUTPATIENT_CLINIC_OR_DEPARTMENT_OTHER): Payer: BC Managed Care – PPO

## 2023-06-23 ENCOUNTER — Encounter (HOSPITAL_COMMUNITY)
Admission: EM | Disposition: A | Discharge status: Home / Self Care | Payer: Self-pay | Source: Home / Self Care | Attending: Emergency Medicine

## 2023-06-23 ENCOUNTER — Encounter (HOSPITAL_BASED_OUTPATIENT_CLINIC_OR_DEPARTMENT_OTHER): Payer: Self-pay | Admitting: Emergency Medicine

## 2023-06-23 ENCOUNTER — Ambulatory Visit (HOSPITAL_BASED_OUTPATIENT_CLINIC_OR_DEPARTMENT_OTHER)
Admission: EM | Admit: 2023-06-23 | Discharge: 2023-06-23 | Disposition: A | Discharge status: Home / Self Care | Payer: BC Managed Care – PPO | Attending: Surgery | Admitting: Surgery

## 2023-06-23 DIAGNOSIS — I10 Essential (primary) hypertension: Secondary | ICD-10-CM | POA: Diagnosis not present

## 2023-06-23 DIAGNOSIS — G4733 Obstructive sleep apnea (adult) (pediatric): Secondary | ICD-10-CM | POA: Insufficient documentation

## 2023-06-23 DIAGNOSIS — Z79899 Other long term (current) drug therapy: Secondary | ICD-10-CM | POA: Diagnosis not present

## 2023-06-23 DIAGNOSIS — A498 Other bacterial infections of unspecified site: Secondary | ICD-10-CM | POA: Diagnosis not present

## 2023-06-23 DIAGNOSIS — Z793 Long term (current) use of hormonal contraceptives: Secondary | ICD-10-CM | POA: Insufficient documentation

## 2023-06-23 DIAGNOSIS — E039 Hypothyroidism, unspecified: Secondary | ICD-10-CM | POA: Insufficient documentation

## 2023-06-23 DIAGNOSIS — Z6841 Body Mass Index (BMI) 40.0 and over, adult: Secondary | ICD-10-CM | POA: Insufficient documentation

## 2023-06-23 DIAGNOSIS — K611 Rectal abscess: Secondary | ICD-10-CM | POA: Diagnosis not present

## 2023-06-23 DIAGNOSIS — Z79890 Hormone replacement therapy (postmenopausal): Secondary | ICD-10-CM | POA: Insufficient documentation

## 2023-06-23 DIAGNOSIS — E66813 Obesity, class 3: Secondary | ICD-10-CM | POA: Insufficient documentation

## 2023-06-23 DIAGNOSIS — Z7984 Long term (current) use of oral hypoglycemic drugs: Secondary | ICD-10-CM | POA: Insufficient documentation

## 2023-06-23 DIAGNOSIS — F419 Anxiety disorder, unspecified: Secondary | ICD-10-CM | POA: Insufficient documentation

## 2023-06-23 DIAGNOSIS — K219 Gastro-esophageal reflux disease without esophagitis: Secondary | ICD-10-CM | POA: Diagnosis not present

## 2023-06-23 DIAGNOSIS — J45909 Unspecified asthma, uncomplicated: Secondary | ICD-10-CM | POA: Insufficient documentation

## 2023-06-23 DIAGNOSIS — K6289 Other specified diseases of anus and rectum: Secondary | ICD-10-CM

## 2023-06-23 HISTORY — PX: INCISION AND DRAINAGE PERIRECTAL ABSCESS: SHX1804

## 2023-06-23 LAB — CBC WITH DIFFERENTIAL/PLATELET
Abs Immature Granulocytes: 0.03 10*3/uL (ref 0.00–0.07)
Basophils Absolute: 0 10*3/uL (ref 0.0–0.1)
Basophils Relative: 1 %
Eosinophils Absolute: 0.1 10*3/uL (ref 0.0–0.5)
Eosinophils Relative: 1 %
HCT: 37.5 % (ref 36.0–46.0)
Hemoglobin: 12.9 g/dL (ref 12.0–15.0)
Immature Granulocytes: 0 %
Lymphocytes Relative: 18 %
Lymphs Abs: 1.6 10*3/uL (ref 0.7–4.0)
MCH: 31.4 pg (ref 26.0–34.0)
MCHC: 34.4 g/dL (ref 30.0–36.0)
MCV: 91.2 fL (ref 80.0–100.0)
Monocytes Absolute: 0.6 10*3/uL (ref 0.1–1.0)
Monocytes Relative: 7 %
Neutro Abs: 6.5 10*3/uL (ref 1.7–7.7)
Neutrophils Relative %: 73 %
Platelets: 415 10*3/uL — ABNORMAL HIGH (ref 150–400)
RBC: 4.11 MIL/uL (ref 3.87–5.11)
RDW: 12.2 % (ref 11.5–15.5)
WBC: 8.9 10*3/uL (ref 4.0–10.5)
nRBC: 0 % (ref 0.0–0.2)

## 2023-06-23 LAB — RESP PANEL BY RT-PCR (RSV, FLU A&B, COVID)  RVPGX2
Influenza A by PCR: NEGATIVE
Influenza B by PCR: NEGATIVE
Resp Syncytial Virus by PCR: NEGATIVE
SARS Coronavirus 2 by RT PCR: NEGATIVE

## 2023-06-23 LAB — BASIC METABOLIC PANEL
Anion gap: 6 (ref 5–15)
BUN: 10 mg/dL (ref 6–20)
CO2: 20 mmol/L — ABNORMAL LOW (ref 22–32)
Calcium: 8.5 mg/dL — ABNORMAL LOW (ref 8.9–10.3)
Chloride: 107 mmol/L (ref 98–111)
Creatinine, Ser: 1 mg/dL (ref 0.44–1.00)
GFR, Estimated: >60 mL/min (ref >60)
Glucose, Bld: 107 mg/dL — ABNORMAL HIGH (ref 70–99)
Potassium: 3.5 mmol/L (ref 3.5–5.1)
Sodium: 133 mmol/L — ABNORMAL LOW (ref 135–145)

## 2023-06-23 LAB — POCT PREGNANCY, URINE: Preg Test, Ur: NEGATIVE

## 2023-06-23 SURGERY — INCISION AND DRAINAGE, ABSCESS, PERIRECTAL
Anesthesia: General

## 2023-06-23 MED ORDER — CHLORHEXIDINE GLUCONATE 0.12 % MT SOLN: 15.0000 mL | Freq: Once | OROMUCOSAL | Status: DC

## 2023-06-23 MED ORDER — BUPIVACAINE LIPOSOME 1.3 % IJ SUSP
INTRAMUSCULAR | Status: AC
  Filled 2023-06-23: qty 20

## 2023-06-23 MED ORDER — LACTATED RINGERS IV SOLN: INTRAVENOUS | Status: DC

## 2023-06-23 MED ORDER — LIDOCAINE 2% (20 MG/ML) 5 ML SYRINGE
INTRAMUSCULAR | Status: DC | PRN
  Administered 2023-06-23: 100 mg via INTRAVENOUS

## 2023-06-23 MED ORDER — BUPIVACAINE LIPOSOME 1.3 % IJ SUSP
INTRAMUSCULAR | Status: DC | PRN
  Administered 2023-06-23: 20 mL

## 2023-06-23 MED ORDER — LIDOCAINE HCL (PF) 2 % IJ SOLN
INTRAMUSCULAR | Status: AC
  Filled 2023-06-23: qty 5

## 2023-06-23 MED ORDER — CELECOXIB 200 MG PO CAPS
200.0000 mg | ORAL_CAPSULE | ORAL | Status: AC
  Administered 2023-06-23: 200 mg via ORAL
  Filled 2023-06-23: qty 1

## 2023-06-23 MED ORDER — CHLORHEXIDINE GLUCONATE 0.12 % MT SOLN
15.0000 mL | Freq: Once | OROMUCOSAL | Status: AC
  Administered 2023-06-23: 15 mL via OROMUCOSAL

## 2023-06-23 MED ORDER — ORAL CARE MOUTH RINSE: 15.0000 mL | Freq: Once | OROMUCOSAL | Status: DC

## 2023-06-23 MED ORDER — BUPIVACAINE-EPINEPHRINE 0.5% -1:200000 IJ SOLN
INTRAMUSCULAR | Status: DC | PRN
  Administered 2023-06-23: 20 mL

## 2023-06-23 MED ORDER — MIDAZOLAM HCL 2 MG/2ML IJ SOLN
INTRAMUSCULAR | Status: DC | PRN
  Administered 2023-06-23: 2 mg via INTRAVENOUS

## 2023-06-23 MED ORDER — BUPIVACAINE-EPINEPHRINE (PF) 0.5% -1:200000 IJ SOLN
INTRAMUSCULAR | Status: AC
  Filled 2023-06-23: qty 30

## 2023-06-23 MED ORDER — HYDROCODONE-ACETAMINOPHEN 5-325 MG PO TABS
1.0000 | ORAL_TABLET | Freq: Once | ORAL | Status: AC
  Administered 2023-06-23: 1 via ORAL
  Filled 2023-06-23: qty 1

## 2023-06-23 MED ORDER — FENTANYL CITRATE (PF) 250 MCG/5ML IJ SOLN
INTRAMUSCULAR | Status: AC
  Filled 2023-06-23: qty 5

## 2023-06-23 MED ORDER — ACETAMINOPHEN 500 MG PO TABS
1000.0000 mg | ORAL_TABLET | ORAL | Status: AC
  Administered 2023-06-23: 1000 mg via ORAL
  Filled 2023-06-23: qty 2

## 2023-06-23 MED ORDER — MORPHINE SULFATE (PF) 4 MG/ML IV SOLN
4.0000 mg | Freq: Once | INTRAVENOUS | Status: AC
  Administered 2023-06-23: 4 mg via INTRAVENOUS
  Filled 2023-06-23: qty 1

## 2023-06-23 MED ORDER — SODIUM CHLORIDE 0.9 % IV SOLN
3.0000 g | Freq: Once | INTRAVENOUS | Status: AC
  Administered 2023-06-23: 3 g via INTRAVENOUS

## 2023-06-23 MED ORDER — DOXYCYCLINE HYCLATE 100 MG PO CAPS
100.0000 mg | ORAL_CAPSULE | Freq: Two times a day (BID) | ORAL | 0 refills | Status: DC
Start: 2023-06-23 — End: 2023-06-23

## 2023-06-23 MED ORDER — IOHEXOL 300 MG/ML  SOLN
125.0000 mL | Freq: Once | INTRAMUSCULAR | Status: AC | PRN
  Administered 2023-06-23: 125 mL via INTRAVENOUS

## 2023-06-23 MED ORDER — 0.9 % SODIUM CHLORIDE (POUR BTL) OPTIME
TOPICAL | Status: DC | PRN
  Administered 2023-06-23: 1000 mL

## 2023-06-23 MED ORDER — DEXAMETHASONE SODIUM PHOSPHATE 10 MG/ML IJ SOLN
INTRAMUSCULAR | Status: DC | PRN
  Administered 2023-06-23: 5 mg via INTRAVENOUS

## 2023-06-23 MED ORDER — PROPOFOL 10 MG/ML IV BOLUS
INTRAVENOUS | Status: AC
  Filled 2023-06-23: qty 20

## 2023-06-23 MED ORDER — PROPOFOL 10 MG/ML IV BOLUS
INTRAVENOUS | Status: DC | PRN
  Administered 2023-06-23: 30 mg via INTRAVENOUS
  Administered 2023-06-23: 170 mg via INTRAVENOUS

## 2023-06-23 MED ORDER — FENTANYL CITRATE (PF) 100 MCG/2ML IJ SOLN
INTRAMUSCULAR | Status: DC | PRN
  Administered 2023-06-23: 100 ug via INTRAVENOUS
  Administered 2023-06-23 (×3): 50 ug via INTRAVENOUS

## 2023-06-23 MED ORDER — OXYCODONE HCL 5 MG PO TABS: 5.0000 mg | ORAL_TABLET | Freq: Four times a day (QID) | ORAL | 0 refills | Status: DC | PRN

## 2023-06-23 MED ORDER — MIDAZOLAM HCL 2 MG/2ML IJ SOLN
INTRAMUSCULAR | Status: AC
  Filled 2023-06-23: qty 2

## 2023-06-23 MED ORDER — ONDANSETRON HCL 4 MG/2ML IJ SOLN
INTRAMUSCULAR | Status: DC | PRN
  Administered 2023-06-23: 4 mg via INTRAVENOUS

## 2023-06-23 SURGICAL SUPPLY — 14 items
BAG COUNTER SPONGE SURGICOUNT (BAG) IMPLANT
BLADE SURG 15 STRL LF DISP TIS (BLADE) ×1 IMPLANT
DRAIN PENROSE 0.25X18 (DRAIN) IMPLANT
GAUZE 4X4 16PLY ~~LOC~~+RFID DBL (SPONGE) ×1 IMPLANT
GAUZE PAD ABD 8X10 STRL (GAUZE/BANDAGES/DRESSINGS) IMPLANT
GAUZE SPONGE 4X4 12PLY STRL (GAUZE/BANDAGES/DRESSINGS) IMPLANT
GLOVE BIO SURGEON STRL SZ 6.5 (GLOVE) ×1 IMPLANT
GLOVE INDICATOR 6.5 STRL GRN (GLOVE) ×2 IMPLANT
GOWN STRL REUS W/ TWL XL LVL3 (GOWN DISPOSABLE) ×2 IMPLANT
PACK LITHOTOMY IV (CUSTOM PROCEDURE TRAY) ×1 IMPLANT
PENCIL SMOKE EVACUATOR (MISCELLANEOUS) IMPLANT
SURGILUBE 2OZ TUBE FLIPTOP (MISCELLANEOUS) ×1 IMPLANT
SUT SILK 2-0 18XBRD TIE 12 (SUTURE) IMPLANT
TOWEL OR 17X26 10 PK STRL BLUE (TOWEL DISPOSABLE) ×1 IMPLANT

## 2023-06-23 NOTE — Op Note (Signed)
Date: 06/23/23  Patient: Carolyn Castro MRN: [id number]  Preoperative Diagnosis: Perirectal abscess Postoperative Diagnosis: Same  Procedure: Rectal exam under anesthesia, incision and drainage of perirectal abscess  Surgeon: Sophronia Simas, MD  EBL: Minimal  Anesthesia: General LMA  Specimens: Perirectal abscess for culture  Indications: Ms. Sala is a 42 yo female who presented with worsening perianal pain for the last few days. A CT showed a perirectal abscess on the left side. After a discussion of the details of surgery, she consented to proceed with incision and drainage in the OR.  Findings: Perirectal abscess in the left posterolateral position, tracking along the left side of the rectum and toward the left gluteus. No masses in the anal canal, no obvious fistula opening visible in the anal canal.  Procedure details: Informed consent was obtained in the preoperative area prior to the procedure. The patient was brought to the operating room and general anesthesia was induced. The patient was placed in lithotomy position, and was prepped and draped in the usual sterile fashion. A pre-procedure timeout was taken verifying patient identity, surgical site and procedure to be performed.  A digital rectal exam was performed and there were no masses in the anal canal. A small Hill-Ferguson retractor was inserted and the anal canal was examined. There were no obvious fistula openings visible. The area of fluctuance and induration the left posterolateral perianal skin was palpated, and a radial incision was made with an 11-blade scalpel. Copious purulent drainage was expressed, and the wound was swabbed for cultures. The wound was probed with a hemostat and tracked superiorly along the lateral rectum, without a clear communication with the rectum. There was also tracking toward the left gluteus. The wound was irrigated with sterile saline. A bulky gauze dressing was applied.  The patient  tolerated the procedure well with no apparent complications. All counts were correct x2 at the end of the procedure. The patient was extubated and taken to PACU in stable condition.  Sophronia Simas, MD 06/23/23 11:48 AM

## 2023-06-23 NOTE — ED Notes (Signed)
Patient transported to CT 

## 2023-06-23 NOTE — ED Provider Notes (Signed)
42 year old female here for concern for rectal abscess and sinusitis.  Hannah from prior provider with plan to follow-up CT head and CT pelvis.  CT head reviewed no acute findings including no signs of sinusitis or other intracranial abnormality.  Labs overall reassuring, mild hyponatremia.  CT pelvis is concerning for gluteal abscess with possible fistula.  Chaperoned rectal exam performed, some mild induration but no clear drainable collection externally.  I talked with Tresa Endo with general surgery, patient was accepted for transfer to [facility] preoperative area for operative drainage with Dr. Sophronia Simas.  Patient sent via [facility].  She did receive IV antibiotics before she left.   Laurence Spates, MD 06/23/23 [phone]

## 2023-06-23 NOTE — Transfer of Care (Signed)
Immediate Anesthesia Transfer of Care Note  Patient: Carolyn Castro  Procedure(s) Performed: IRRIGATION AND DEBRIDEMENT PERIRECTAL ABSCESS  Patient Location: PACU  Anesthesia Type:General  Level of Consciousness: sedated  Airway & Oxygen Therapy: Patient Spontanous Breathing and Patient connected to face mask oxygen  Post-op Assessment: Report given to RN and Post -op Vital signs reviewed and stable  Post vital signs: Reviewed and stable  Last Vitals:  Vitals Value Taken Time  BP    Temp    Pulse 103 06/23/23 1155  Resp 15 06/23/23 1155  SpO2 98 % 06/23/23 1155  Vitals shown include unfiled device data.  Last Pain:  Vitals:   06/23/23 1017  TempSrc:   PainSc: 7          Complications: No notable events documented.

## 2023-06-23 NOTE — Anesthesia Postprocedure Evaluation (Signed)
Anesthesia Post Note  Patient: [name]  Procedure(s) Performed: IRRIGATION AND DEBRIDEMENT PERIRECTAL ABSCESS     Patient location during evaluation: PACU Anesthesia Type: General Level of consciousness: awake and alert Pain management: pain level controlled Vital Signs Assessment: post-procedure vital signs reviewed and stable Respiratory status: spontaneous breathing, nonlabored ventilation and respiratory function stable Cardiovascular status: blood pressure returned to baseline, stable and tachycardic Postop Assessment: no apparent nausea or vomiting Anesthetic complications: no   No notable events documented.  Last Vitals:  Vitals:   06/23/23 1215 06/23/23 1305  BP: (!) 152/91 (!) 160/97  Pulse: (!) 118 (!) 104  Resp: 17   Temp:  36.7 C  SpO2: 100% 100%    Last Pain:  Vitals:   06/23/23 1305  TempSrc: Oral  PainSc: 0-No pain                 Collene Schlichter

## 2023-06-23 NOTE — Progress Notes (Signed)
   06/23/23 1016  OBSTRUCTIVE SLEEP APNEA  Have you ever been diagnosed with sleep apnea through a sleep study? Yes  If yes, do you have and use a CPAP or BPAP machine every night? 1  Do you know the presssure settings on your maching? Yes  Do you snore loudly (loud enough to be heard through closed doors)?  0  Do you often feel tired, fatigued, or sleepy during the daytime (such as falling asleep during driving or talking to someone)? 0  Has anyone observed you stop breathing during your sleep? 0  Do you have, or are you being treated for high blood pressure? 1  BMI more than 35 kg/m2? 1  Age > 50 (1-yes) 0  Neck circumference greater than:Female 16 inches or larger, Female 17inches or larger? 1  Female Gender (Yes=1) 0  Obstructive Sleep Apnea Score 3   Patient is under the care of sleep physician. Does not need referal

## 2023-06-23 NOTE — H&P (Signed)
Carolyn Castro March 19, 1981  [id number].    Chief Complaint/Reason for Consult: left gluteal/perirectal abscess  HPI:  This is a 42 yo female with a history of asthma, hypothyroidism, and HTN who noticed a firm area on her buttock about 3 days ago.  This has not spontaneously drained and the pain has continued to worsen.  She denies any fevers.  She has never had anything like this before.  Last BM was today with no pain with this.  She denies any blood in her stool or purulent drainage noted in her stool.  She has had congestion for the last month with steroid tape and abx, but no other complaints such as chest pain, SOB, dysuria, etc.  She presented to [facility] today for evaluation.  She was noted to have a left gluteal/perirectal abscess with possible fistula.  We have been asked to see her for drainage.  ROS: ROS: see HPI  Family History  Problem Relation Age of Onset   Hypertension Mother    Hypertension Father    Adrenal disorder Father        uncertain type    Past Medical History:  Diagnosis Date   Angio-edema    ankles   Anxiety    Asthma    Back pain    Constipation    Fatigue    Hypertension    Hypothyroid    Hypothyroidism     Past Surgical History:  Procedure Laterality Date   BACK SURGERY     COLONOSCOPY     ESOPHAGOGASTRODUODENOSCOPY (EGD) WITH PROPOFOL N/A 01/28/2019   Procedure: ESOPHAGOGASTRODUODENOSCOPY (EGD) WITH PROPOFOL;  Surgeon: Carman Ching, MD;  Location: [facility];  Service: Endoscopy;  Laterality: N/A;   LAPAROSCOPIC OVARIAN CYSTECTOMY Left 06/29/2018   Procedure: LAPAROSCOPIC OVARIAN CYSTECTOMY;  Surgeon: Gerald Leitz, MD;  Location: [facility];  Service: Gynecology;  Laterality: Left;   TONSILLECTOMY     WISDOM TOOTH EXTRACTION      Social History:  reports that she has never smoked. She has never been exposed to tobacco smoke. She has never used smokeless tobacco. She reports that she does not drink alcohol and does not  use drugs.  Allergies:  Allergies  Allergen Reactions   Ciprofloxacin Hcl Diarrhea and Other (See Comments)    Developed C-Diff   Fluticasone Propionate     Other reaction(s): Headaches   Triamcinolone Acetonide     Other reaction(s): rash on face    (Not in a hospital admission)    Physical Exam: Blood pressure 125/76, pulse 93, temperature 97.8 F (36.6 C), resp. rate (!) 21, height 5\' 4"  (1.626 m), weight 117 kg, SpO2 100%. General: pleasant, WD, WN female who is laying in bed in NAD HEENT: head is normocephalic, atraumatic.  Sclera are noninjected.  PERRL.  Ears and nose without any masses or lesions.  Mouth is pink and moist Heart: regular, rate, and rhythm.  Normal s1,s2. No obvious murmurs, gallops, or rubs noted.  Palpable radial and pedal pulses bilaterally Lungs: CTAB, no wheezes, rhonchi, or rales noted.  Respiratory effort nonlabored Abd: soft, NT, ND, obese, +BS, no masses or hernias Buttock: some erythema on the left side of the anus.  There is a small amount of induration and fluctuance in this area.  No drainage or spontaneous openings.  This is tender to palpation.  DRE deferred at this time due to pain. Psych: A&Ox3 with an appropriate affect.  Results for orders placed or performed during the hospital  encounter of 06/23/23 (from the past 48 hours)  Resp panel by RT-PCR (RSV, Flu A&B, Covid) Anterior Nasal Swab     Status: None   Collection Time: 06/23/23  5:57 AM   Specimen: Anterior Nasal Swab  Result Value Ref Range   SARS Coronavirus 2 by RT PCR NEGATIVE NEGATIVE    Comment: (NOTE) SARS-CoV-2 target nucleic acids are NOT DETECTED.  The SARS-CoV-2 RNA is generally detectable in upper respiratory specimens during the acute phase of infection. The lowest concentration of SARS-CoV-2 viral copies this assay can detect is 138 copies/mL. A negative result does not preclude SARS-Cov-2 infection and should not be used as the sole basis for treatment or other  patient management decisions. A negative result may occur with  improper specimen collection/handling, submission of specimen other than nasopharyngeal swab, presence of viral mutation(s) within the areas targeted by this assay, and inadequate number of viral copies(<138 copies/mL). A negative result must be combined with clinical observations, patient history, and epidemiological information. The expected result is Negative.  Fact Sheet for Patients:  BloggerCourse.com  Fact Sheet for Healthcare Providers:  SeriousBroker.it  This test is no t yet approved or cleared by the [place] FDA and  has been authorized for detection and/or diagnosis of SARS-CoV-2 by FDA under an Emergency Use Authorization (EUA). This EUA will remain  in effect (meaning this test can be used) for the duration of the COVID-19 declaration under Section 564(b)(1) of the Act, 21 U.S.C.section 360bbb-3(b)(1), unless the authorization is terminated  or revoked sooner.       Influenza A by PCR NEGATIVE NEGATIVE   Influenza B by PCR NEGATIVE NEGATIVE    Comment: (NOTE) The Xpert Xpress SARS-CoV-2/FLU/RSV plus assay is intended as an aid in the diagnosis of influenza from Nasopharyngeal swab specimens and should not be used as a sole basis for treatment. Nasal washings and aspirates are unacceptable for Xpert Xpress SARS-CoV-2/FLU/RSV testing.  Fact Sheet for Patients: BloggerCourse.com  Fact Sheet for Healthcare Providers: SeriousBroker.it  This test is not yet approved or cleared by the [place] FDA and has been authorized for detection and/or diagnosis of SARS-CoV-2 by FDA under an Emergency Use Authorization (EUA). This EUA will remain in effect (meaning this test can be used) for the duration of the COVID-19 declaration under Section 564(b)(1) of the Act, 21 U.S.C. section 360bbb-3(b)(1), unless  the authorization is terminated or revoked.     Resp Syncytial Virus by PCR NEGATIVE NEGATIVE    Comment: (NOTE) Fact Sheet for Patients: BloggerCourse.com  Fact Sheet for Healthcare Providers: SeriousBroker.it  This test is not yet approved or cleared by the [place] FDA and has been authorized for detection and/or diagnosis of SARS-CoV-2 by FDA under an Emergency Use Authorization (EUA). This EUA will remain in effect (meaning this test can be used) for the duration of the COVID-19 declaration under Section 564(b)(1) of the Act, 21 U.S.C. section 360bbb-3(b)(1), unless the authorization is terminated or revoked.  Performed at [facility], [address] [place], [place] [address]   CBC with Differential     Status: Abnormal   Collection Time: 06/23/23  5:59 AM  Result Value Ref Range   WBC 8.9 4.0 - 10.5 K/uL   RBC 4.11 3.87 - 5.11 MIL/uL   Hemoglobin 12.9 12.0 - 15.0 g/dL   HCT 60.4 54.0 - 98.1 %   MCV 91.2 80.0 - 100.0 fL   MCH 31.4 26.0 - 34.0 pg   MCHC 34.4  30.0 - 36.0 g/dL   RDW 62.9 52.8 - 41.3 %   Platelets 415 (H) 150 - 400 K/uL   nRBC 0.0 0.0 - 0.2 %   Neutrophils Relative % 73 %   Neutro Abs 6.5 1.7 - 7.7 K/uL   Lymphocytes Relative 18 %   Lymphs Abs 1.6 0.7 - 4.0 K/uL   Monocytes Relative 7 %   Monocytes Absolute 0.6 0.1 - 1.0 K/uL   Eosinophils Relative 1 %   Eosinophils Absolute 0.1 0.0 - 0.5 K/uL   Basophils Relative 1 %   Basophils Absolute 0.0 0.0 - 0.1 K/uL   Immature Granulocytes 0 %   Abs Immature Granulocytes 0.03 0.00 - 0.07 K/uL    Comment: Performed at [facility] [place] [facility], [address] [place], [place] [address]  Basic metabolic panel     Status: Abnormal   Collection Time: 06/23/23  5:59 AM  Result Value Ref Range   Sodium 133 (L) 135 - 145 mmol/L   Potassium 3.5 3.5 - 5.1 mmol/L   Chloride 107 98 - 111 mmol/L   CO2 20 (L) 22 - 32 mmol/L   Glucose, Bld 107 (H) 70  - 99 mg/dL    Comment: Glucose reference range applies only to samples taken after fasting for at least 8 hours.   BUN 10 6 - 20 mg/dL   Creatinine, Ser 0.27 0.44 - 1.00 mg/dL   Calcium 8.5 (L) 8.9 - 10.3 mg/dL   GFR, Estimated >25 >36 mL/min    Comment: (NOTE) Calculated using the CKD-EPI Creatinine Equation (2021)    Anion gap 6 5 - 15    Comment: Performed at [facility], [address] [place], [place] [address]   CT PELVIS W CONTRAST Result Date: 06/23/2023 CLINICAL DATA:  Evaluate for perianal abscess or fistula. Fluctuant, erythematous induration of the left perianal area. EXAM: CT PELVIS WITH CONTRAST TECHNIQUE: Multidetector CT imaging of the pelvis was performed using the standard protocol following the bolus administration of intravenous contrast. RADIATION DOSE REDUCTION: This exam was performed according to the departmental dose-optimization program which includes automated exposure control, adjustment of the mA and/or kV according to patient size and/or use of iterative reconstruction technique. CONTRAST:  OMNIPAQUE IOHEXOL 300 MG/ML  SOLN COMPARISON:  05/07/2023 FINDINGS: Urinary Tract:  No abnormality visualized. Bowel: The visualized bowel loops of the lower abdomen and pelvis are normal in caliber without wall thickening or inflammation. Normal appendix. Vascular/Lymphatic: No pathologically enlarged lymph nodes. No significant vascular abnormality seen. Reproductive:  No mass or other significant abnormality Other: Within the left gluteal fold at the level of the anal verge there is a focal fluid collection measuring 3.6 x 2.9 by 2.7 cm with ill-defined margins and surrounding stranding of the fat, image 71/603 and image 95/303. Possible fistula tract arises off the 1 o'clock position at the and a rectal junction, which extends up to this fluid collection, image 94/3 and may represent a perianal fistula. Musculoskeletal: Postoperative changes from L5-S1 posterior  hardware fixation and interbody fusion with decompression laminectomy. No acute or suspicious osseous findings. IMPRESSION: There is a focal fluid collection within the left gluteal fold at the level of the anal verge measuring 3.6 x 2.9 x 2.7 cm with ill-defined margins and surrounding stranding of the fat. Imaging findings are compatible with abscess. Possible fistula tract arises off the 1 o'clock position at the anorectal junction, which extends up to this fluid collection and may represent a perianal  fistula. Electronically Signed   By: Signa Kell M.D.   On: 06/23/2023 07:38   DG Chest 2 View Result Date: 06/23/2023 CLINICAL DATA:  Cough and congestion EXAM: CHEST - 2 VIEW COMPARISON:  03/30/2023 FINDINGS: Normal heart size and mediastinal contours. No acute infiltrate or edema. No effusion or pneumothorax. No acute osseous findings. IMPRESSION: No active cardiopulmonary disease. Electronically Signed   By: Tiburcio Pea M.D.   On: 06/23/2023 07:35   CT Head Wo Contrast Result Date: 06/23/2023 CLINICAL DATA:  Headache, sudden and severe.  Nasal sinus issue. EXAM: CT HEAD WITHOUT CONTRAST TECHNIQUE: Contiguous axial images were obtained from the base of the skull through the vertex without intravenous contrast. RADIATION DOSE REDUCTION: This exam was performed according to the departmental dose-optimization program which includes automated exposure control, adjustment of the mA and/or kV according to patient size and/or use of iterative reconstruction technique. COMPARISON:  10/28/2020 FINDINGS: Brain: No evidence of acute infarction, hemorrhage, hydrocephalus, extra-axial collection or mass lesion/mass effect. Vascular: No hyperdense vessel or unexpected calcification. Skull: Normal. Negative for fracture or focal lesion. Sinuses/Orbits: No acute finding. IMPRESSION: Normal head CT. Electronically Signed   By: Tiburcio Pea M.D.   On: 06/23/2023 07:14      Assessment/Plan Left  gluteal/perirectal abscess, possible fistula The patient has been seen, examined, labs, vitals, chart, and imaging personally reviewed.  She does appear to have an abscess in her perirectal area.  We will plan to proceed to the OR today for drainage and evaluation under anesthesia.  This has been discussed with the patient and she agrees.  Likely plan for DC home from the PACU if all goes well and she is agreeable to this too.   FEN - NPO VTE - SCDs in OR ID - unasyn Admit - likely DC from PACU  Asthma OSA HTN Hypothyroidism   I reviewed ED provider notes, last 24 h vitals and pain scores, last 48 h intake and output, last 24 h labs and trends, and last 24 h imaging results.  Letha Cape, [facility] 06/23/2023, 8:21 AM Please see Amion for pager number during day hours 7:00am-4:30pm or 7:00am -11:30am on weekends

## 2023-06-23 NOTE — Anesthesia Procedure Notes (Signed)
Procedure Name: LMA Insertion Date/Time: 06/23/2023 11:14 AM  Performed by: Elyn Peers, CRNAPre-anesthesia Checklist: Patient identified, Emergency Drugs available, Suction available, Patient being monitored and Timeout performed Patient Re-evaluated:Patient Re-evaluated prior to induction Oxygen Delivery Method: Circle system utilized Preoxygenation: Pre-oxygenation with 100% oxygen Induction Type: IV induction Ventilation: Mask ventilation without difficulty LMA: LMA inserted and LMA with gastric port inserted LMA Size: 4.5 Number of attempts: 1 Placement Confirmation: positive ETCO2 and breath sounds checked- equal and bilateral Tube secured with: Tape Dental Injury: Teeth and Oropharynx as per pre-operative assessment

## 2023-06-23 NOTE — Anesthesia Preprocedure Evaluation (Addendum)
Anesthesia Evaluation  Patient identified by MRN, date of birth, ID band Patient awake    Reviewed: Allergy & Precautions, NPO status , Patient's Chart, lab work & pertinent test results  History of Anesthesia Complications Negative for: history of anesthetic complications  Airway Mallampati: III  TM Distance: >3 FB Neck ROM: Full    Dental  (+) Teeth Intact, Dental Advisory Given   Pulmonary asthma , sleep apnea and Continuous Positive Airway Pressure Ventilation , Not current smoker   Pulmonary exam normal breath sounds clear to auscultation       Cardiovascular hypertension, Pt. on medications Normal cardiovascular exam Rhythm:Regular Rate:Normal     Neuro/Psych  PSYCHIATRIC DISORDERS Anxiety     negative neurological ROS     GI/Hepatic Neg liver ROS,GERD  Medicated,,PERIRECTAL ABSCESS   Endo/Other  Hypothyroidism  Class 3 obesity  Renal/GU negative Renal ROS     Musculoskeletal negative musculoskeletal ROS (+)    Abdominal   Peds  Hematology negative hematology ROS (+)   Anesthesia Other Findings Day of surgery medications reviewed with the patient.  Reproductive/Obstetrics                             Anesthesia Physical Anesthesia Plan  ASA: 3  Anesthesia Plan: General   Post-op Pain Management: Tylenol PO (pre-op)* and Celebrex PO (pre-op)*   Induction: Intravenous  PONV Risk Score and Plan: 3 and Midazolam, Dexamethasone and Ondansetron  Airway Management Planned: LMA  Additional Equipment:   Intra-op Plan:   Post-operative Plan: Extubation in OR  Informed Consent: I have reviewed the patients History and Physical, chart, labs and discussed the procedure including the risks, benefits and alternatives for the proposed anesthesia with the patient or authorized representative who has indicated his/her understanding and acceptance.     Dental advisory given  Plan  Discussed with: CRNA  Anesthesia Plan Comments:        Anesthesia Quick Evaluation

## 2023-06-23 NOTE — ED Provider Notes (Signed)
[place] [facility] AT [facility] Provider Note   CSN: [id number] Arrival date & time: 06/23/23  7829     History  Chief Complaint  Patient presents with   Abscess   Nasal Congestion    Carolyn Castro is a 42 y.o. female.  Patient with a history of asthma, hypothyroidism, hypertension presenting with concern for "rectal abscess".  Denies any bleeding or drainage.  She noticed a firm area to her buttocks about 3 days ago making it difficult to sit.  No fevers, chills, nausea, vomiting.  No change in bowel habits.  No black or bloody stools.  Does have a history of hemorrhoids but this feels different.  No history of previous abscesses.  Has had hemorrhoids in the past.  No abdominal pain.  Some intermittent diarrhea.  No black or bloody stools.  No chest pain or shortness of breath.  Additionally has "sinus congestion" intermittently for the past 1 month.  She has been treated multiple times with prednisone as well as 2 courses of amoxicillin by urgent care and PCP.  Still having sinus headaches and congestion and pain to her face.  No chest pain or shortness of breath.  No neck pain.  No fever.  The history is provided by the patient.  Abscess Associated symptoms: headaches   Associated symptoms: no fever, no nausea and no vomiting        Home Medications Prior to Admission medications   Medication Sig Start Date End Date Taking? Authorizing Provider  acetaminophen (TYLENOL) 500 MG tablet Take 2 tablets (1,000 mg total) by mouth every 6 (six) hours as needed for moderate pain. 03/18/23   Fayrene Helper, PA-C  albuterol (VENTOLIN HFA) 108 (90 Base) MCG/ACT inhaler Inhale 2 puffs into the lungs every 6 (six) hours as needed for wheezing or shortness of breath.    [provider]  ALPRAZolam Prudy Feeler) 1 MG tablet Take 1 tablet (1 mg total) by mouth 2 (two) times daily as needed for anxiety. 03/31/23   Cottle, Steva Ready., MD  amLODipine (NORVASC) 10 MG  tablet Take 10 mg by mouth daily.    [provider]  azelastine (ASTELIN) 0.1 % nasal spray INSTILL 2 SPRAYS INTO BOTH NOSTRILS TWICE DAILY 10/11/21   Cobb, Ruby Cola, NP  B Complex-C (B-COMPLEX WITH VITAMIN C) tablet Take 1 tablet by mouth daily.    [provider]  bacitracin ointment Apply 1 application. topically 2 (two) times daily. Patient not taking: Reported on 06/07/2023 11/15/21   Derwood Kaplan, MD  benzonatate (TESSALON) 100 MG capsule Take 1 capsule (100 mg total) by mouth 3 (three) times daily as needed for cough. Patient not taking: Reported on 06/07/2023 01/18/23   Glenford Bayley, NP  Budeson-Glycopyrrol-Formoterol (BREZTRI AEROSPHERE) 160-9-4.8 MCG/ACT AERO Inhale 2 puffs into the lungs in the morning and at bedtime. 08/03/21   Cobb, Ruby Cola, NP  cholecalciferol (VITAMIN D3) 25 MCG (1000 UNIT) tablet Take 1,000 Units by mouth daily.    [provider]  CRANBERRY PO Take 1 capsule by mouth in the morning and at bedtime.    [provider]  cyclobenzaprine (FLEXERIL) 10 MG tablet Take 1 tablet (10 mg total) by mouth 2 (two) times daily as needed for muscle spasms. Patient not taking: Reported on 06/07/2023 03/18/23   Fayrene Helper, PA-C  dicyclomine (BENTYL) 20 MG tablet Take 1 tablet (20 mg total) by mouth 2 (two) times daily for 30 doses. 05/07/23 05/22/23  Alvester Chou  J, MD  diphenhydrAMINE (BENADRYL) 25 mg capsule Take 25 mg by mouth every 6 (six) hours as needed. 2 tablets as needed    [provider]  fexofenadine (ALLEGRA) 180 MG tablet Take 180 mg by mouth daily.    [provider]  gabapentin (NEURONTIN) 300 MG capsule Take 1 capsule (300 mg total) by mouth nightly as needed. Patient not taking: Reported on 06/07/2023 02/25/22     ibuprofen (ADVIL) 200 MG tablet Take 200 mg by mouth every 6 (six) hours as needed. 4 tablets as needed    [provider]  influenza vac split quadrivalent PF (FLUARIX) 0.5 ML  injection Inject into the muscle. 04/14/22   Judyann Munson, MD  levothyroxine (SYNTHROID) 75 MCG tablet Take 75 mcg by mouth daily before breakfast.    [provider]  metFORMIN (GLUCOPHAGE-XR) 500 MG 24 hr tablet Take 500 mg by mouth 2 (two) times daily. 01/15/23   [provider]  montelukast (SINGULAIR) 10 MG tablet Take 1 tablet (10 mg total) by mouth every evening. 04/01/22     Multiple Vitamins-Minerals (MULTIVITAMIN WOMEN) TABS Take 1 tablet by mouth daily.    [provider]  norethindrone-ethinyl estradiol (LOESTRIN) 1-20 MG-MCG tablet Take 1 tablet by mouth daily. 03/29/21   [provider]  Olopatadine-Mometasone (Cristal Generous) [phone] MCG/ACT SUSP Place 2 sprays into the nose 2 (two) times daily. Patient not taking: Reported on 06/07/2023 03/04/22   Marcelyn Bruins, MD  omeprazole (PRILOSEC) 20 MG capsule Take 40 mg by mouth 2 (two) times daily. 05/10/21   [provider]  ondansetron (ZOFRAN-ODT) 4 MG disintegrating tablet Take 1 tablet (4 mg total) by mouth every 8 (eight) hours as needed for nausea 04/05/22     PARoxetine (PAXIL) 30 MG tablet Take 2 tablets (60 mg total) by mouth at bedtime. 03/09/23   Cottle, Steva Ready., MD  phentermine (ADIPEX-P) 37.5 MG tablet Take 37.5 mg by mouth daily before breakfast. 1/2 tablet Patient not taking: Reported on 06/07/2023    [provider]  predniSONE (DELTASONE) 10 MG tablet 4 tabs for 2 days, then 3 tabs for 2 days, 2 tabs for 2 days, then 1 tab for 2 days, then stop 01/18/23   Glenford Bayley, NP  Probiotic Product (PROBIOTIC PO) Take 1 capsule by mouth daily at 12 noon. Patient not taking: Reported on 06/07/2023    [provider]  Semaglutide-Weight Management [facility]) 1 MG/0.5ML SOAJ Inject 1 mg into the skin once a week Patient not taking: Reported on 03/09/2023 05/04/22     Semaglutide-Weight Management (WEGOVY) 2.4 MG/0.75ML SOAJ Inject 2.4 mg into the skin once a  week. Patient not taking: Reported on 03/09/2023 09/12/22     Semaglutide-Weight Management 1.7 MG/0.75ML SOAJ Inject 1.7 mg into the skin once weekly. Patient not taking: Reported on 03/09/2023 06/15/22     Semaglutide-Weight Management 2.4 MG/0.75ML SOAJ Inject 2.4 mg into the skin once a week. Patient not taking: Reported on 03/09/2023 10/03/22     topiramate (TOPAMAX) 50 MG tablet Take 50 mg by mouth daily. 01/15/23   [provider]  traZODone (DESYREL) 150 MG tablet Take 1 tablet (150 mg total) by mouth at bedtime. Patient not taking: Reported on 06/07/2023 03/09/23   Lauraine Rinne., MD      Allergies    Ciprofloxacin hcl, Fluticasone propionate, and Triamcinolone acetonide    Review of Systems   Review of Systems  Constitutional:  Negative for activity change, appetite change  and fever.  HENT:  Positive for congestion and rhinorrhea. Negative for sore throat.   Respiratory:  Negative for cough, chest tightness and shortness of breath.   Cardiovascular:  Negative for chest pain.  Gastrointestinal:  Positive for rectal pain. Negative for abdominal pain, anal bleeding, blood in stool, nausea and vomiting.  Genitourinary:  Negative for dysuria and hematuria.  Musculoskeletal:  Negative for arthralgias.  Skin:  Negative for rash.  Neurological:  Positive for headaches. Negative for dizziness and weakness.   all other systems are negative except as noted in the HPI and PMH.    Physical Exam Updated Vital Signs BP (!) 148/87 (BP Location: Right Arm)   Pulse (!) 105   Temp 97.8 F (36.6 C) (Oral)   Resp (!) 22   Ht 5\' 4"  (1.626 m)   Wt 117 kg   SpO2 98%   BMI 44.27 kg/m  Physical Exam Vitals and nursing note reviewed.  Constitutional:      General: She is not in acute distress.    Appearance: She is well-developed. She is obese. She is not ill-appearing.  HENT:     Head: Normocephalic and atraumatic.     Right Ear: Tympanic membrane normal.     Left Ear: Tympanic  membrane normal.     Mouth/Throat:     Mouth: Mucous membranes are moist.     Pharynx: No oropharyngeal exudate or posterior oropharyngeal erythema.  Eyes:     Conjunctiva/sclera: Conjunctivae normal.     Pupils: Pupils are equal, round, and reactive to light.  Neck:     Comments: No meningismus. Cardiovascular:     Rate and Rhythm: Normal rate and regular rhythm.     Heart sounds: Normal heart sounds. No murmur heard. Pulmonary:     Effort: Pulmonary effort is normal. No respiratory distress.     Breath sounds: Normal breath sounds.  Abdominal:     Palpations: Abdomen is soft.     Tenderness: There is no abdominal tenderness. There is no guarding or rebound.  Genitourinary:    Comments: Chaperone present Probation officer.  Slight induration to left anus at 9 o'clock position.  No overlying erythema or fluctuance. External hemorrhoid 6 o'clock position, nontender Musculoskeletal:        General: No tenderness. Normal range of motion.     Cervical back: Normal range of motion and neck supple.  Skin:    General: Skin is warm.  Neurological:     Mental Status: She is alert and oriented to person, place, and time.     Cranial Nerves: No cranial nerve deficit.     Motor: No abnormal muscle tone.     Coordination: Coordination normal.     Comments:  5/5 strength throughout. CN 2-12 intact.Equal grip strength.   Psychiatric:        Behavior: Behavior normal.     ED Results / Procedures / Treatments   Labs (all labs ordered are listed, but only abnormal results are displayed) Labs Reviewed  CBC WITH DIFFERENTIAL/PLATELET - Abnormal; Notable for the following components:      Result Value   Platelets 415 (*)    All other components within normal limits  BASIC METABOLIC PANEL - Abnormal; Notable for the following components:   Sodium 133 (*)    CO2 20 (*)    Glucose, Bld 107 (*)    Calcium 8.5 (*)    All other components within normal limits  RESP PANEL BY RT-PCR (RSV, FLU A&B,  COVID)  [id number]    EKG None  Radiology No results found.  Procedures Procedures    Medications Ordered in ED Medications  HYDROcodone-acetaminophen (NORCO/VICODIN) 5-325 MG per tablet 1 tablet (has no administration in time range)    ED Course/ Medical Decision Making/ A&P                                 Medical Decision Making Amount and/or Complexity of Data Reviewed Labs: ordered. Decision-making details documented in ED Course. Radiology: ordered and independent interpretation performed. Decision-making details documented in ED Course. ECG/medicine tests: ordered and independent interpretation performed. Decision-making details documented in ED Course.  Risk Prescription drug management.   3 days of rectal pain and concern for abscess.  Stable vitals.  No distress.  Abdomen soft without peritoneal signs.  On exam she has slight induration near her anus but no obvious fluctuance or erythema.  Will obtain basic labs.  She has no history of diabetes.  No significant leukocytosis or fever noted.  CT scan to be obtained for further evaluation of possible perianal or perirectal abscess.  No obvious abscess seen clinically.  She does have slight area of induration but no drainable fluid collection clinically.  Care will be transferred at shift change pending imaging and further evaluation of her rectal pain.  Will check covid and flu swabs and CXR for further evaluation of her sinus congestion.  She has been on antibiotics twice as well as a course of prednisone.  She has no hypoxia or increased work of breathing. Low suspicion for pneumonia but will obtain chest x-ray.          Final Clinical Impression(s) / ED Diagnoses Final diagnoses:  None    Rx / DC Orders ED Discharge Orders     None         Eri Platten, Jeannett Senior, MD 06/23/23 [phone]

## 2023-06-23 NOTE — Discharge Instructions (Signed)
[facility] DISCHARGE INSTRUCTIONS  Activity You may resume activities as tolerated. Do not drive while taking narcotic pain medication.  Wound Care You may have some drainage and small amounts of bleeding after surgery. If you have persistent excessive bleeding, please call the office immediately or come to the emergency room. You may keep your wound covered with dry gauze while it is healing. You should perform sitz baths 3 times daily and after bowel movements - sit in several inches of warm water for 15-20 minutes to keep the area clean.  Medications Take stool softeners and fiber to help prevent constipation. It is very important to avoid constipation and straining with bowel movements. Drink plenty of water every day. Take the pain medication as prescribed.  When to Call us: Fever greater than 100.5 Persistent bleeding from your wound Difficulty urinating  Follow-up You will have a follow up appointment with Dr. Freida Busman in 2-3 weeks. This will be at the [facility] office at [address], [address], [place], [place]. Please arrive at least 15 minutes prior to your scheduled appointment time.  For questions or concerns, please call the office at [phone].

## 2023-06-23 NOTE — ED Triage Notes (Signed)
Pt has abscess in left butt cheek. Also has sinus issue X 1 month, has been seen for and treated but not gotten any better.

## 2023-06-23 NOTE — ED Notes (Signed)
Report given to [facility] 

## 2023-06-24 ENCOUNTER — Encounter (HOSPITAL_COMMUNITY): Payer: Self-pay | Admitting: Surgery

## 2023-06-27 LAB — AEROBIC/ANAEROBIC CULTURE W GRAM STAIN (SURGICAL/DEEP WOUND)

## 2023-07-11 ENCOUNTER — Other Ambulatory Visit: Payer: Self-pay | Admitting: Surgery

## 2023-07-11 DIAGNOSIS — R1013 Epigastric pain: Secondary | ICD-10-CM

## 2023-07-12 ENCOUNTER — Ambulatory Visit
Admission: RE | Admit: 2023-07-12 | Discharge: 2023-07-12 | Disposition: A | Discharge status: Home / Self Care | Payer: 59 | Source: Ambulatory Visit | Attending: Surgery | Admitting: Surgery

## 2023-07-12 DIAGNOSIS — R1013 Epigastric pain: Secondary | ICD-10-CM

## 2023-07-17 ENCOUNTER — Other Ambulatory Visit: Payer: Self-pay | Admitting: Surgery

## 2023-07-17 ENCOUNTER — Encounter: Payer: Self-pay | Admitting: Surgery

## 2023-07-17 DIAGNOSIS — R109 Unspecified abdominal pain: Secondary | ICD-10-CM

## 2023-07-18 ENCOUNTER — Emergency Department (HOSPITAL_BASED_OUTPATIENT_CLINIC_OR_DEPARTMENT_OTHER): Payer: 59

## 2023-07-18 ENCOUNTER — Emergency Department (HOSPITAL_BASED_OUTPATIENT_CLINIC_OR_DEPARTMENT_OTHER)
Admission: EM | Admit: 2023-07-18 | Discharge: 2023-07-18 | Disposition: A | Discharge status: Home / Self Care | Payer: 59 | Attending: Emergency Medicine | Admitting: Emergency Medicine

## 2023-07-18 ENCOUNTER — Other Ambulatory Visit: Payer: Self-pay

## 2023-07-18 ENCOUNTER — Other Ambulatory Visit (HOSPITAL_BASED_OUTPATIENT_CLINIC_OR_DEPARTMENT_OTHER): Payer: Self-pay

## 2023-07-18 ENCOUNTER — Other Ambulatory Visit: Payer: 59

## 2023-07-18 ENCOUNTER — Encounter (HOSPITAL_BASED_OUTPATIENT_CLINIC_OR_DEPARTMENT_OTHER): Payer: Self-pay

## 2023-07-18 DIAGNOSIS — J45909 Unspecified asthma, uncomplicated: Secondary | ICD-10-CM | POA: Insufficient documentation

## 2023-07-18 DIAGNOSIS — N10 Acute tubulo-interstitial nephritis: Secondary | ICD-10-CM

## 2023-07-18 DIAGNOSIS — Z7951 Long term (current) use of inhaled steroids: Secondary | ICD-10-CM | POA: Insufficient documentation

## 2023-07-18 DIAGNOSIS — I10 Essential (primary) hypertension: Secondary | ICD-10-CM | POA: Diagnosis not present

## 2023-07-18 DIAGNOSIS — N12 Tubulo-interstitial nephritis, not specified as acute or chronic: Secondary | ICD-10-CM | POA: Diagnosis not present

## 2023-07-18 DIAGNOSIS — Z7984 Long term (current) use of oral hypoglycemic drugs: Secondary | ICD-10-CM | POA: Insufficient documentation

## 2023-07-18 DIAGNOSIS — R109 Unspecified abdominal pain: Secondary | ICD-10-CM | POA: Diagnosis present

## 2023-07-18 DIAGNOSIS — Z79899 Other long term (current) drug therapy: Secondary | ICD-10-CM | POA: Diagnosis not present

## 2023-07-18 DIAGNOSIS — Z20822 Contact with and (suspected) exposure to covid-19: Secondary | ICD-10-CM | POA: Insufficient documentation

## 2023-07-18 LAB — URINALYSIS, MICROSCOPIC (REFLEX)

## 2023-07-18 LAB — URINALYSIS, ROUTINE W REFLEX MICROSCOPIC
Bilirubin Urine: NEGATIVE
Glucose, UA: NEGATIVE mg/dL
Ketones, ur: NEGATIVE mg/dL
Nitrite: NEGATIVE
Protein, ur: NEGATIVE mg/dL
Specific Gravity, Urine: 1.02 (ref 1.005–1.030)
pH: 6.5 (ref 5.0–8.0)

## 2023-07-18 LAB — COMPREHENSIVE METABOLIC PANEL
ALT: 30 U/L (ref 0–44)
AST: 25 U/L (ref 15–41)
Albumin: 3.8 g/dL (ref 3.5–5.0)
Alkaline Phosphatase: 52 U/L (ref 38–126)
Anion gap: 9 (ref 5–15)
BUN: 15 mg/dL (ref 6–20)
CO2: 18 mmol/L — ABNORMAL LOW (ref 22–32)
Calcium: 8.7 mg/dL — ABNORMAL LOW (ref 8.9–10.3)
Chloride: 107 mmol/L (ref 98–111)
Creatinine, Ser: 1.08 mg/dL — ABNORMAL HIGH (ref 0.44–1.00)
GFR, Estimated: >60 mL/min (ref >60)
Glucose, Bld: 92 mg/dL (ref 70–99)
Potassium: 3.8 mmol/L (ref 3.5–5.1)
Sodium: 134 mmol/L — ABNORMAL LOW (ref 135–145)
Total Bilirubin: 0.5 mg/dL (ref 0.0–1.2)
Total Protein: 7.9 g/dL (ref 6.5–8.1)

## 2023-07-18 LAB — LACTIC ACID, PLASMA: Lactic Acid, Venous: 1.8 mmol/L (ref 0.5–1.9)

## 2023-07-18 LAB — CBC WITH DIFFERENTIAL/PLATELET
Abs Immature Granulocytes: 0.02 10*3/uL (ref 0.00–0.07)
Basophils Absolute: 0.1 10*3/uL (ref 0.0–0.1)
Basophils Relative: 1 %
Eosinophils Absolute: 0.1 10*3/uL (ref 0.0–0.5)
Eosinophils Relative: 1 %
HCT: 43.7 % (ref 36.0–46.0)
Hemoglobin: 15.2 g/dL — ABNORMAL HIGH (ref 12.0–15.0)
Immature Granulocytes: 0 %
Lymphocytes Relative: 30 %
Lymphs Abs: 3 10*3/uL (ref 0.7–4.0)
MCH: 31.8 pg (ref 26.0–34.0)
MCHC: 34.8 g/dL (ref 30.0–36.0)
MCV: 91.4 fL (ref 80.0–100.0)
Monocytes Absolute: 0.6 10*3/uL (ref 0.1–1.0)
Monocytes Relative: 6 %
Neutro Abs: 6.1 10*3/uL (ref 1.7–7.7)
Neutrophils Relative %: 62 %
Platelets: 421 10*3/uL — ABNORMAL HIGH (ref 150–400)
RBC: 4.78 MIL/uL (ref 3.87–5.11)
RDW: 12.5 % (ref 11.5–15.5)
WBC: 9.8 10*3/uL (ref 4.0–10.5)
nRBC: 0 % (ref 0.0–0.2)

## 2023-07-18 LAB — PREGNANCY, URINE: Preg Test, Ur: NEGATIVE

## 2023-07-18 LAB — RESP PANEL BY RT-PCR (RSV, FLU A&B, COVID)  RVPGX2
Influenza A by PCR: NEGATIVE
Influenza B by PCR: NEGATIVE
Resp Syncytial Virus by PCR: NEGATIVE
SARS Coronavirus 2 by RT PCR: NEGATIVE

## 2023-07-18 LAB — LIPASE, BLOOD: Lipase: 30 U/L (ref 11–51)

## 2023-07-18 MED ORDER — CEPHALEXIN 250 MG PO CAPS
500.0000 mg | ORAL_CAPSULE | Freq: Once | ORAL | Status: AC
  Administered 2023-07-18: 500 mg via ORAL
  Filled 2023-07-18: qty 2

## 2023-07-18 MED ORDER — ONDANSETRON 4 MG PO TBDP
4.0000 mg | ORAL_TABLET | Freq: Three times a day (TID) | ORAL | 0 refills | Status: DC | PRN
  Filled 2023-07-18: qty 18, 21d supply, fill #0

## 2023-07-18 MED ORDER — CEPHALEXIN 500 MG PO CAPS
500.0000 mg | ORAL_CAPSULE | Freq: Two times a day (BID) | ORAL | 0 refills | Status: AC
  Filled 2023-07-18: qty 10, 5d supply, fill #0

## 2023-07-18 MED ORDER — IOHEXOL 300 MG/ML  SOLN
100.0000 mL | Freq: Once | INTRAMUSCULAR | Status: AC | PRN
  Administered 2023-07-18: 125 mL via INTRAVENOUS

## 2023-07-18 MED ORDER — ONDANSETRON HCL 4 MG/2ML IJ SOLN
4.0000 mg | Freq: Once | INTRAMUSCULAR | Status: AC
  Administered 2023-07-18: 4 mg via INTRAVENOUS
  Filled 2023-07-18: qty 2

## 2023-07-18 MED ORDER — OXYCODONE HCL 5 MG PO TABS
5.0000 mg | ORAL_TABLET | Freq: Four times a day (QID) | ORAL | 0 refills | Status: DC | PRN
  Filled 2023-07-18: qty 10, 3d supply, fill #0

## 2023-07-18 MED ORDER — FENTANYL CITRATE PF 50 MCG/ML IJ SOSY
50.0000 ug | PREFILLED_SYRINGE | Freq: Once | INTRAMUSCULAR | Status: AC
  Administered 2023-07-18: 50 ug via INTRAVENOUS
  Filled 2023-07-18: qty 1

## 2023-07-18 NOTE — Discharge Instructions (Signed)
 Treating you for a kidney/bladder infection with antibiotics.  I have prescribed you oxycodone  for breakthrough pain if needed do not mix alcohol or drugs as this medication is sedating.  Continue Tylenol  and ibuprofen .  Take Zofran  as needed for nausea and vomiting.  Take next dose antibiotic around dinnertime tonight.  Please return if symptoms worsen.  At this time I think from an intra-abdominal process there is no infectious or surgical process.  But continue to monitor how your gallbladder feels as we discussed.

## 2023-07-18 NOTE — ED Triage Notes (Signed)
 Pt with sludge in gallbladder that was found on US . Pt supposed to have CT scan today, but she has had fevers (last temp of 101 at home) and also endorses severe abd pain to top that radiates to RT side and RT lower flank. Pt last took tylenol  and ibuprofen  3 hours ago. Pt has no appetite.

## 2023-07-18 NOTE — ED Provider Triage Note (Signed)
 Emergency Medicine Provider Triage Evaluation Note  Carolyn Castro , a 43 y.o. female  was evaluated in triage.  Pt complains of fever, back pain abdominal pain and diarrhea   Review of Systems  Positive: diarrhea Negative: rash   Physical Exam  BP (!) 143/97 (BP Location: Left Arm)   Pulse (!) 108   Temp 99.9 F (37.7 C)   Resp 20   Ht 5' 4 (1.626 m)   Wt 120.2 kg   LMP  (LMP Unknown) Comment: urine POCT NEGATIVE  SpO2 99%   BMI 45.49 kg/m  Gen:   Awake, no distress   Resp:  Normal effort  MSK:   Moves extremities without difficulty  Other:  AO3  Medical Decision Making  Medically screening exam initiated at 6:44 AM.  Appropriate orders placed.  Carolyn Castro was informed that the remainder of the evaluation will be completed by another provider, this initial triage assessment does not replace that evaluation, and the importance of remaining in the ED until their evaluation is complete.     Carolyn Matuska, MD 07/18/23 [phone]

## 2023-07-18 NOTE — ED Notes (Signed)
 ED Provider at bedside.

## 2023-07-18 NOTE — ED Provider Notes (Signed)
 [facility] Provider Note   CSN: [id number] Arrival date & time: 07/18/23  0522     History  Chief Complaint  Patient presents with   Abdominal Pain    Carolyn Castro is a 43 y.o. female.  Patient here with abdominal pain back pain fever some loose stools.  She had surgery few weeks ago for perirectal abscess.  Has been dealing with some discomfort in the last couple days.  Had ultrasound done of her gallbladder few days ago that showed sludge but nothing abnormal otherwise but she has had fevers greater than 101.  She was have a CT scan today.  She is also having some pain in the right side to her right flank.  Patient has history of asthma hypertension constipation.  Denies any weakness numbness tingling.  Denies any cough sputum production.  She does state that eating makes it worse.  She had a fever for the last 3 days.  The history is provided by the patient.       Home Medications Prior to Admission medications   Medication Sig Start Date End Date Taking? Authorizing Provider  cephALEXin  (KEFLEX ) 500 MG capsule Take 1 capsule (500 mg total) by mouth 2 (two) times daily for 5 days. 07/18/23 07/23/23 Yes Wendell Fiebig, DO  ondansetron  (ZOFRAN -ODT) 4 MG disintegrating tablet Take 1 tablet (4 mg total) by mouth every 8 (eight) hours as needed. 07/18/23  Yes Dorthie Santini, DO  oxyCODONE  (ROXICODONE ) 5 MG immediate release tablet Take 1 tablet (5 mg total) by mouth every 6 (six) hours as needed for up to 10 doses. 07/18/23  Yes Terryon Pineiro, DO  acetaminophen  (TYLENOL ) 500 MG tablet Take 2 tablets (1,000 mg total) by mouth every 6 (six) hours as needed for moderate pain. 03/18/23   Nivia Colon, PA-C  albuterol  (VENTOLIN  HFA) 108 (90 Base) MCG/ACT inhaler Inhale 2 puffs into the lungs every 6 (six) hours as needed for wheezing or shortness of breath.    [provider]  ALPRAZolam  (XANAX ) 1 MG tablet Take 1 tablet (1 mg total) by  mouth 2 (two) times daily as needed for anxiety. 03/31/23   Cottle, Lorene KANDICE Raddle., MD  amLODipine  (NORVASC ) 10 MG tablet Take 10 mg by mouth daily.    [provider]  azelastine  (ASTELIN ) 0.1 % nasal spray INSTILL 2 SPRAYS INTO BOTH NOSTRILS TWICE DAILY 10/11/21   Cobb, Comer GAILS, NP  B Complex-C (B-COMPLEX WITH VITAMIN C) tablet Take 1 tablet by mouth daily.    [provider]  Budeson-Glycopyrrol-Formoterol  (BREZTRI  AEROSPHERE) 160-9-4.8 MCG/ACT AERO Inhale 2 puffs into the lungs in the morning and at bedtime. 08/03/21   Cobb, Comer GAILS, NP  cholecalciferol  (VITAMIN D3) 25 MCG (1000 UNIT) tablet Take 1,000 Units by mouth daily.    [provider]  CRANBERRY PO Take 1 capsule by mouth in the morning and at bedtime.    [provider]  dicyclomine  (BENTYL ) 20 MG tablet Take 1 tablet (20 mg total) by mouth 2 (two) times daily for 30 doses. 05/07/23 06/23/23  Cottie Donnice PARAS, MD  diphenhydrAMINE  (BENADRYL ) 25 mg capsule Take 25 mg by mouth every 6 (six) hours as needed. 2 tablets as needed    [provider]  fexofenadine  (ALLEGRA ) 180 MG tablet Take 180 mg by mouth daily.    [provider]  ibuprofen  (ADVIL ) 200 MG tablet Take 200 mg by mouth every 6 (six) hours as needed. 4 tablets as  needed    [provider]  influenza vac split quadrivalent PF (FLUARIX) 0.5 ML injection Inject into the muscle. 04/14/22   Luiz Channel, MD  levothyroxine  (SYNTHROID ) 75 MCG tablet Take 75 mcg by mouth daily before breakfast.    [provider]  metFORMIN (GLUCOPHAGE-XR) 500 MG 24 hr tablet Take 500 mg by mouth 2 (two) times daily. 01/15/23   [provider]  montelukast  (SINGULAIR ) 10 MG tablet Take 1 tablet (10 mg total) by mouth every evening. 04/01/22     Multiple Vitamins-Minerals (MULTIVITAMIN WOMEN) TABS Take 1 tablet by mouth daily.    [provider]  norethindrone -ethinyl estradiol  (LOESTRIN ) 1-20 MG-MCG tablet Take  1 tablet by mouth daily. 03/29/21   [provider]  Olopatadine-Mometasone  (RYALTRIS ) [id number] MCG/ACT SUSP Place 2 sprays into the nose 2 (two) times daily. 03/04/22   Jeneal Danita Macintosh, MD  omeprazole  (PRILOSEC ) 20 MG capsule Take 40 mg by mouth 2 (two) times daily. 05/10/21   [provider]  PARoxetine  (PAXIL ) 30 MG tablet Take 2 tablets (60 mg total) by mouth at bedtime. 03/09/23   Cottle, Lorene KANDICE Raddle., MD  phentermine (ADIPEX-P) 37.5 MG tablet Take 37.5 mg by mouth daily before breakfast. 1/2 tablet Patient not taking: Reported on 06/07/2023    [provider]  predniSONE  (DELTASONE ) 10 MG tablet 4 tabs for 2 days, then 3 tabs for 2 days, 2 tabs for 2 days, then 1 tab for 2 days, then stop 01/18/23   Hope Almarie ORN, NP  Probiotic Product (PROBIOTIC PO) Take 1 capsule by mouth daily at 12 noon. Patient not taking: Reported on 06/07/2023    [provider]  topiramate (TOPAMAX) 50 MG tablet Take 50 mg by mouth daily. 01/15/23   [provider]      Allergies    Ciprofloxacin hcl, Fluticasone  propionate, and Triamcinolone  acetonide    Review of Systems   Review of Systems  Physical Exam Updated Vital Signs BP 126/77   Pulse (!) 110   Temp 99.9 F (37.7 C)   Resp 20   Ht 5' 4 (1.626 m)   Wt 120.2 kg   LMP  (LMP Unknown) Comment: NEG UPREG IN ER 07/17/22  SpO2 97%   BMI 45.49 kg/m  Physical Exam Vitals and nursing note reviewed.  Constitutional:      General: She is not in acute distress.    Appearance: She is well-developed. She is not ill-appearing.  HENT:     Head: Normocephalic and atraumatic.  Eyes:     Extraocular Movements: Extraocular movements intact.     Conjunctiva/sclera: Conjunctivae normal.  Cardiovascular:     Rate and Rhythm: Normal rate and regular rhythm.     Heart sounds: Normal heart sounds. No murmur heard. Pulmonary:     Effort: Pulmonary effort is normal. No respiratory distress.     Breath sounds:  Normal breath sounds.  Abdominal:     Palpations: Abdomen is soft.     Tenderness: There is abdominal tenderness.  Musculoskeletal:        General: No swelling.     Cervical back: Neck supple.  Skin:    General: Skin is warm and dry.     Capillary Refill: Capillary refill takes less than 2 seconds.  Neurological:     Mental Status: She is alert.  Psychiatric:        Mood and Affect: Mood normal.     ED Results / Procedures / Treatments   Labs (all  labs ordered are listed, but only abnormal results are displayed) Labs Reviewed  CBC WITH DIFFERENTIAL/PLATELET - Abnormal; Notable for the following components:      Result Value   Hemoglobin 15.2 (*)    Platelets 421 (*)    All other components within normal limits  COMPREHENSIVE METABOLIC PANEL - Abnormal; Notable for the following components:   Sodium 134 (*)    CO2 18 (*)    Creatinine, Ser 1.08 (*)    Calcium 8.7 (*)    All other components within normal limits  URINALYSIS, ROUTINE W REFLEX MICROSCOPIC - Abnormal; Notable for the following components:   APPearance CLOUDY (*)    Hgb urine dipstick TRACE (*)    Leukocytes,Ua TRACE (*)    All other components within normal limits  URINALYSIS, MICROSCOPIC (REFLEX) - Abnormal; Notable for the following components:   Bacteria, UA MANY (*)    All other components within normal limits  RESP PANEL BY RT-PCR (RSV, FLU A&B, COVID)  [id number]  URINE CULTURE  LIPASE, BLOOD  PREGNANCY, URINE  LACTIC ACID, PLASMA    EKG None  Radiology CT ABDOMEN PELVIS W CONTRAST Result Date: 07/18/2023 CLINICAL DATA:  Acute right-sided abdominal pain. EXAM: CT ABDOMEN AND PELVIS WITH CONTRAST TECHNIQUE: Multidetector CT imaging of the abdomen and pelvis was performed using the standard protocol following bolus administration of intravenous contrast. RADIATION DOSE REDUCTION: This exam was performed according to the departmental dose-optimization program which includes automated exposure control,  adjustment of the mA and/or kV according to patient size and/or use of iterative reconstruction technique. CONTRAST:  OMNIPAQUE  IOHEXOL  300 MG/ML  SOLN COMPARISON:  June 23, 2023. FINDINGS: Lower chest: No acute abnormality. Hepatobiliary: No focal liver abnormality is seen. No gallstones, gallbladder wall thickening, or biliary dilatation. Pancreas: Unremarkable. No pancreatic ductal dilatation or surrounding inflammatory changes. Spleen: Normal in size without focal abnormality. Adrenals/Urinary Tract: Adrenal glands are unremarkable. Kidneys are normal, without renal calculi, focal lesion, or hydronephrosis. Bladder is unremarkable. Stomach/Bowel: Stomach is within normal limits. Appendix appears normal. No evidence of bowel wall thickening, distention, or inflammatory changes. Vascular/Lymphatic: No significant vascular findings are present. No enlarged abdominal or pelvic lymph nodes. Reproductive: Uterus and bilateral adnexa are unremarkable. Other: No abdominal wall hernia or abnormality. No abdominopelvic ascites. Musculoskeletal: Status post surgical posterior fusion of L5-S1. No acute osseous abnormality is noted. IMPRESSION: No acute abnormality seen. Electronically Signed   By: Lynwood Landy Raddle M.D.   On: 07/18/2023 08:38    Procedures Procedures    Medications Ordered in ED Medications  cephALEXin  (KEFLEX ) capsule 500 mg (has no administration in time range)  ondansetron  (ZOFRAN ) injection 4 mg (4 mg Intravenous Given 07/18/23 0631)  iohexol  (OMNIPAQUE ) 300 MG/ML solution 100 mL (125 mLs Intravenous Contrast Given 07/18/23 0713)  fentaNYL  (SUBLIMAZE ) injection 50 mcg (50 mcg Intravenous Given 07/18/23 0757)    ED Course/ Medical Decision Making/ A&P                                 Medical Decision Making Amount and/or Complexity of Data Reviewed Labs: ordered.  Risk Prescription drug management.   ELISAVET BUEHRER is here with abdominal pain and fever.  History of  anxiety hypertension.  Just had surgery a few weeks ago for perirectal abscess.  She has not been on any antibiotics recently.  She had fever for the last 3 days.  Right-sided abdominal pain.  No obvious  UTI symptoms.  She seems to be getting worse when she eats.  She had an ultrasound of her gallbladder couple days that showed sludge but no evidence of cholecystitis.  She continues with fever and pain.  Plan was for CT scan outpatient today but will get one now including infectious workup as patient does have a temp of 99.9 mild tachycardia.  Overall labs do not show any significant leukocytosis or lactic acidosis per my review and interpretation.  Gallbladder liver enzymes and lipase all unremarkable.  Pregnancy test negative.  Urinalysis somewhat equivocal.  Trace leukocytes and many bacteria but there is some squamous cells in there.  She might be a little bit symptomatic from UTI but awaiting CT scan abdomen pelvis.  Patient given IV Zofran  IV fentanyl  will reevaluate.  Overall CT scan per radiology reports unremarkable.  No evidence of gallstones or gallbladder wall thickening.  Pancreas unremarkable.  No evidence of appendicitis or bowel obstruction.  Urinalysis does look like there could be infection there she is having some symptoms.  Overall we will treat for bladder/kidney infection.  Will give Zofran  and Roxicodone  for breakthrough pain.  No pneumonia seen in the lower lungs.  I have no concern for PE or ACS or other acute process.  She does not appear to be septic given lab work otherwise.  I think she is safe for discharge home and strict return precautions were given.  Understands to follow-up with primary care as well.  Discharge.  This chart was dictated using voice recognition software.  Despite best efforts to proofread,  errors can occur which can change the documentation meaning.         Final Clinical Impression(s) / ED Diagnoses Final diagnoses:  Acute pyelonephritis    Rx /  DC Orders ED Discharge Orders          Ordered    cephALEXin  (KEFLEX ) 500 MG capsule  2 times daily        07/18/23 0846    ondansetron  (ZOFRAN -ODT) 4 MG disintegrating tablet  Every 8 hours PRN        07/18/23 0846    oxyCODONE  (ROXICODONE ) 5 MG immediate release tablet  Every 6 hours PRN        07/18/23 0846              Ruthe Cornet, DO 07/18/23 [phone]

## 2023-07-20 ENCOUNTER — Encounter (HOSPITAL_COMMUNITY): Payer: Self-pay | Admitting: Surgery

## 2023-07-20 ENCOUNTER — Ambulatory Visit: Payer: Self-pay | Admitting: Surgery

## 2023-07-20 LAB — URINE CULTURE: Culture: 30000 — AB

## 2023-07-20 NOTE — Progress Notes (Signed)
SDW call  Patient was given pre-op instructions over the phone. Patient verbalized understanding of instructions provided.     PCP - Jorge Ny, FNP Cardiologist -  Pulmonary:    PPM/ICD - deneis Device Orders - na Rep Notified - na   Chest x-ray - 06/23/2023 EKG -  DOS, 07/21/2023 Stress Test - ECHO - 08/28/2019 Cardiac Cath -   Sleep Study/sleep apnea/CPAP: OSA, wears CPAP  Non-diabetic  On Metformin for weight loss, instructed hold DOS  Blood Thinner Instructions: denies Aspirin Instructions:denies   ERAS Protcol - Clears until 1005  Anesthesia review: No   Patient denies shortness of breath, fever, cough and chest pain over the phone call  Your procedure is scheduled on Friday July 21, 2023  Report to [facility] Main Entrance "A" at  1035  A.M., then check in with the Admitting office.  Call this number if you have problems the morning of surgery:  [phone]   If you have any questions prior to your surgery date call [phone]: Open Monday-Friday 8am-4pm If you experience any cold or flu symptoms such as cough, fever, chills, shortness of breath, etc. between now and your scheduled surgery, please notify us at the above number    Remember:  Do not eat after midnight the night before your surgery  You may drink clear liquids until  1005   the morning of your surgery.   Clear liquids allowed are: Water, Non-Citrus Juices (without pulp), Carbonated Beverages, Clear Tea, Black Coffee ONLY (NO MILK, CREAM OR POWDERED CREAMER of any kind), and Gatorade   Take these medicines the morning of surgery with A SIP OF WATER:  Amlodipine, astelin, breztri aerosphere, prilosec, topamax, keflex, allegra, levothyroxine, loestrin, ryaltris nose spray  As needed: Tylenol, albuteol, xanax, zofran, oxycodone  As of today, STOP taking any Aspirin (unless otherwise instructed by your surgeon) Aleve, Naproxen, Ibuprofen, Motrin, Advil, Goody's, BC's, all herbal medications,  fish oil, and all vitamins.

## 2023-07-21 ENCOUNTER — Ambulatory Visit (HOSPITAL_BASED_OUTPATIENT_CLINIC_OR_DEPARTMENT_OTHER): Payer: 59 | Admitting: Anesthesiology

## 2023-07-21 ENCOUNTER — Telehealth (HOSPITAL_BASED_OUTPATIENT_CLINIC_OR_DEPARTMENT_OTHER): Payer: Self-pay | Admitting: *Deleted

## 2023-07-21 ENCOUNTER — Encounter (HOSPITAL_COMMUNITY): Payer: Self-pay | Admitting: Surgery

## 2023-07-21 ENCOUNTER — Encounter (HOSPITAL_COMMUNITY)
Admission: RE | Disposition: A | Discharge status: Home / Self Care | Payer: Self-pay | Source: Home / Self Care | Attending: Surgery

## 2023-07-21 ENCOUNTER — Ambulatory Visit (HOSPITAL_COMMUNITY): Payer: 59 | Admitting: Anesthesiology

## 2023-07-21 ENCOUNTER — Other Ambulatory Visit: Payer: Self-pay

## 2023-07-21 ENCOUNTER — Ambulatory Visit (HOSPITAL_COMMUNITY)
Admission: RE | Admit: 2023-07-21 | Discharge: 2023-07-21 | Disposition: A | Discharge status: Home / Self Care | Payer: 59 | Attending: Surgery | Admitting: Surgery

## 2023-07-21 DIAGNOSIS — E039 Hypothyroidism, unspecified: Secondary | ICD-10-CM | POA: Insufficient documentation

## 2023-07-21 DIAGNOSIS — Z79899 Other long term (current) drug therapy: Secondary | ICD-10-CM | POA: Diagnosis not present

## 2023-07-21 DIAGNOSIS — F419 Anxiety disorder, unspecified: Secondary | ICD-10-CM | POA: Insufficient documentation

## 2023-07-21 DIAGNOSIS — K8050 Calculus of bile duct without cholangitis or cholecystitis without obstruction: Secondary | ICD-10-CM

## 2023-07-21 DIAGNOSIS — K660 Peritoneal adhesions (postprocedural) (postinfection): Secondary | ICD-10-CM | POA: Diagnosis not present

## 2023-07-21 DIAGNOSIS — I10 Essential (primary) hypertension: Secondary | ICD-10-CM | POA: Diagnosis not present

## 2023-07-21 DIAGNOSIS — G4730 Sleep apnea, unspecified: Secondary | ICD-10-CM | POA: Insufficient documentation

## 2023-07-21 DIAGNOSIS — J45909 Unspecified asthma, uncomplicated: Secondary | ICD-10-CM | POA: Insufficient documentation

## 2023-07-21 DIAGNOSIS — D134 Benign neoplasm of liver: Secondary | ICD-10-CM | POA: Diagnosis not present

## 2023-07-21 DIAGNOSIS — Z8249 Family history of ischemic heart disease and other diseases of the circulatory system: Secondary | ICD-10-CM | POA: Insufficient documentation

## 2023-07-21 DIAGNOSIS — K219 Gastro-esophageal reflux disease without esophagitis: Secondary | ICD-10-CM | POA: Insufficient documentation

## 2023-07-21 DIAGNOSIS — E66813 Obesity, class 3: Secondary | ICD-10-CM | POA: Insufficient documentation

## 2023-07-21 DIAGNOSIS — Z6841 Body Mass Index (BMI) 40.0 and over, adult: Secondary | ICD-10-CM | POA: Insufficient documentation

## 2023-07-21 DIAGNOSIS — K811 Chronic cholecystitis: Secondary | ICD-10-CM | POA: Insufficient documentation

## 2023-07-21 HISTORY — DX: Gastro-esophageal reflux disease without esophagitis: K21.9

## 2023-07-21 HISTORY — DX: Sleep apnea, unspecified: G47.30

## 2023-07-21 HISTORY — PX: CHOLECYSTECTOMY: SHX55

## 2023-07-21 LAB — POCT PREGNANCY, URINE: Preg Test, Ur: NEGATIVE

## 2023-07-21 SURGERY — LAPAROSCOPIC CHOLECYSTECTOMY
Anesthesia: General

## 2023-07-21 MED ORDER — CELECOXIB 200 MG PO CAPS
200.0000 mg | ORAL_CAPSULE | ORAL | Status: AC
  Administered 2023-07-21: 200 mg via ORAL
  Filled 2023-07-21: qty 1

## 2023-07-21 MED ORDER — ROCURONIUM BROMIDE 10 MG/ML (PF) SYRINGE
PREFILLED_SYRINGE | INTRAVENOUS | Status: DC | PRN
  Administered 2023-07-21: 20 mg via INTRAVENOUS
  Administered 2023-07-21: 30 mg via INTRAVENOUS
  Administered 2023-07-21: 40 mg via INTRAVENOUS
  Administered 2023-07-21: 10 mg via INTRAVENOUS

## 2023-07-21 MED ORDER — LIDOCAINE HCL (CARDIAC) PF 100 MG/5ML IV SOSY
PREFILLED_SYRINGE | INTRAVENOUS | Status: DC | PRN
  Administered 2023-07-21: 100 mg via INTRATRACHEAL

## 2023-07-21 MED ORDER — PROPOFOL 10 MG/ML IV BOLUS
INTRAVENOUS | Status: AC
  Filled 2023-07-21: qty 20

## 2023-07-21 MED ORDER — ORAL CARE MOUTH RINSE: 15.0000 mL | Freq: Once | OROMUCOSAL | Status: AC

## 2023-07-21 MED ORDER — ACETAMINOPHEN 500 MG PO TABS
1000.0000 mg | ORAL_TABLET | ORAL | Status: AC
  Administered 2023-07-21: 1000 mg via ORAL
  Filled 2023-07-21: qty 2

## 2023-07-21 MED ORDER — MIDAZOLAM HCL 2 MG/2ML IJ SOLN
INTRAMUSCULAR | Status: DC | PRN
  Administered 2023-07-21: 2 mg via INTRAVENOUS

## 2023-07-21 MED ORDER — BUPIVACAINE-EPINEPHRINE 0.25% -1:200000 IJ SOLN
INTRAMUSCULAR | Status: DC | PRN
  Administered 2023-07-21: 10 mL

## 2023-07-21 MED ORDER — SODIUM CHLORIDE 0.9 % IV SOLN: 12.5000 mg | INTRAVENOUS | Status: DC | PRN

## 2023-07-21 MED ORDER — SUCCINYLCHOLINE CHLORIDE 200 MG/10ML IV SOSY
PREFILLED_SYRINGE | INTRAVENOUS | Status: DC | PRN
  Administered 2023-07-21: 120 mg via INTRAVENOUS

## 2023-07-21 MED ORDER — 0.9 % SODIUM CHLORIDE (POUR BTL) OPTIME
TOPICAL | Status: DC | PRN
  Administered 2023-07-21: 1000 mL

## 2023-07-21 MED ORDER — LACTATED RINGERS IV SOLN: INTRAVENOUS | Status: DC

## 2023-07-21 MED ORDER — SODIUM CHLORIDE 0.9 % IV SOLN
3.0000 g | INTRAVENOUS | Status: AC
  Administered 2023-07-21: 3 g via INTRAVENOUS
  Filled 2023-07-21: qty 3

## 2023-07-21 MED ORDER — OXYCODONE HCL 5 MG PO TABS: 5.0000 mg | ORAL_TABLET | Freq: Four times a day (QID) | ORAL | 0 refills | Status: DC | PRN

## 2023-07-21 MED ORDER — MEPERIDINE HCL 25 MG/ML IJ SOLN: 6.2500 mg | INTRAMUSCULAR | Status: DC | PRN

## 2023-07-21 MED ORDER — ONDANSETRON HCL 4 MG/2ML IJ SOLN
INTRAMUSCULAR | Status: DC | PRN
  Administered 2023-07-21: 4 mg via INTRAVENOUS

## 2023-07-21 MED ORDER — CHLORHEXIDINE GLUCONATE 0.12 % MT SOLN
OROMUCOSAL | Status: AC
  Administered 2023-07-21: 15 mL via OROMUCOSAL
  Filled 2023-07-21: qty 15

## 2023-07-21 MED ORDER — OXYCODONE HCL 5 MG PO TABS: 5.0000 mg | ORAL_TABLET | Freq: Once | ORAL | Status: DC | PRN

## 2023-07-21 MED ORDER — AMISULPRIDE (ANTIEMETIC) 5 MG/2ML IV SOLN: 10.0000 mg | Freq: Once | INTRAVENOUS | Status: DC | PRN

## 2023-07-21 MED ORDER — CHLORHEXIDINE GLUCONATE 0.12 % MT SOLN: 15.0000 mL | Freq: Once | OROMUCOSAL | Status: AC

## 2023-07-21 MED ORDER — MIDAZOLAM HCL 2 MG/2ML IJ SOLN
INTRAMUSCULAR | Status: AC
Start: 2023-07-21 — End: ?
  Filled 2023-07-21: qty 2

## 2023-07-21 MED ORDER — PROPOFOL 10 MG/ML IV BOLUS
INTRAVENOUS | Status: DC | PRN
  Administered 2023-07-21: 150 mg via INTRAVENOUS

## 2023-07-21 MED ORDER — SODIUM CHLORIDE 0.9 % IR SOLN
Status: DC | PRN
  Administered 2023-07-21: 1000 mL

## 2023-07-21 MED ORDER — DEXAMETHASONE SODIUM PHOSPHATE 10 MG/ML IJ SOLN
INTRAMUSCULAR | Status: DC | PRN
  Administered 2023-07-21: 5 mg via INTRAVENOUS

## 2023-07-21 MED ORDER — HYDROMORPHONE HCL 1 MG/ML IJ SOLN: 0.2500 mg | INTRAMUSCULAR | Status: DC | PRN

## 2023-07-21 MED ORDER — BUPIVACAINE-EPINEPHRINE (PF) 0.25% -1:200000 IJ SOLN
INTRAMUSCULAR | Status: AC
  Filled 2023-07-21: qty 30

## 2023-07-21 MED ORDER — SUGAMMADEX SODIUM 200 MG/2ML IV SOLN
INTRAVENOUS | Status: DC | PRN
  Administered 2023-07-21: 200 mg via INTRAVENOUS

## 2023-07-21 MED ORDER — FENTANYL CITRATE (PF) 250 MCG/5ML IJ SOLN
INTRAMUSCULAR | Status: DC | PRN
  Administered 2023-07-21: 100 ug via INTRAVENOUS
  Administered 2023-07-21 (×3): 50 ug via INTRAVENOUS

## 2023-07-21 MED ORDER — OXYCODONE HCL 5 MG/5ML PO SOLN: 5.0000 mg | Freq: Once | ORAL | Status: DC | PRN

## 2023-07-21 MED ORDER — FENTANYL CITRATE (PF) 250 MCG/5ML IJ SOLN
INTRAMUSCULAR | Status: AC
  Filled 2023-07-21: qty 5

## 2023-07-21 SURGICAL SUPPLY — 39 items
APPLIER CLIP 5 13 M/L LIGAMAX5 (MISCELLANEOUS) ×1
BAG COUNTER SPONGE SURGICOUNT (BAG) ×1 IMPLANT
BLADE CLIPPER SURG (BLADE) IMPLANT
CANISTER SUCT 3000ML PPV (MISCELLANEOUS) ×1 IMPLANT
CHLORAPREP W/TINT 26 (MISCELLANEOUS) ×1 IMPLANT
CLIP APPLIE 5 13 M/L LIGAMAX5 (MISCELLANEOUS) ×1 IMPLANT
COVER SURGICAL LIGHT HANDLE (MISCELLANEOUS) ×1 IMPLANT
DERMABOND ADVANCED .7 DNX12 (GAUZE/BANDAGES/DRESSINGS) ×1 IMPLANT
ELECT REM PT RETURN 9FT ADLT (ELECTROSURGICAL) ×1
ELECTRODE REM PT RTRN 9FT ADLT (ELECTROSURGICAL) ×1 IMPLANT
GLOVE BIOGEL PI IND STRL 6 (GLOVE) ×1 IMPLANT
GLOVE BIOGEL PI MICRO STRL 5.5 (GLOVE) ×1 IMPLANT
GOWN STRL REUS W/ TWL LRG LVL3 (GOWN DISPOSABLE) ×3 IMPLANT
IRRIG SUCT STRYKERFLOW 2 WTIP (MISCELLANEOUS) ×1
IRRIGATION SUCT STRKRFLW 2 WTP (MISCELLANEOUS) ×1 IMPLANT
KIT BASIN OR (CUSTOM PROCEDURE TRAY) ×1 IMPLANT
KIT TURNOVER KIT B (KITS) ×1 IMPLANT
L-HOOK LAP DISP 36CM (ELECTROSURGICAL) ×1
LHOOK LAP DISP 36CM (ELECTROSURGICAL) ×1 IMPLANT
NDL INSUFFLATION 14GA 120MM (NEEDLE) IMPLANT
NEEDLE INSUFFLATION 14GA 120MM (NEEDLE) IMPLANT
NS IRRIG 1000ML POUR BTL (IV SOLUTION) ×1 IMPLANT
PAD ARMBOARD 7.5X6 YLW CONV (MISCELLANEOUS) ×1 IMPLANT
PENCIL BUTTON HOLSTER BLD 10FT (ELECTRODE) ×1 IMPLANT
POUCH RETRIEVAL ECOSAC 10 (ENDOMECHANICALS) IMPLANT
SCISSORS LAP 5X35 DISP (ENDOMECHANICALS) ×1 IMPLANT
SET TUBE SMOKE EVAC HIGH FLOW (TUBING) ×1 IMPLANT
SLEEVE Z-THREAD 5X100MM (TROCAR) ×2 IMPLANT
SUT MNCRL AB 4-0 PS2 18 (SUTURE) ×1 IMPLANT
SYS BAG RETRIEVAL 10MM (BASKET) ×1
SYSTEM BAG RETRIEVAL 10MM (BASKET) IMPLANT
TOWEL GREEN STERILE (TOWEL DISPOSABLE) ×1 IMPLANT
TOWEL GREEN STERILE FF (TOWEL DISPOSABLE) ×1 IMPLANT
TRAY LAPAROSCOPIC MC (CUSTOM PROCEDURE TRAY) ×1 IMPLANT
TROCAR BALLN 12MMX100 BLUNT (TROCAR) ×1 IMPLANT
TROCAR Z THREAD OPTICAL 12X100 (TROCAR) IMPLANT
TROCAR Z-THREAD OPTICAL 5X100M (TROCAR) ×1 IMPLANT
WARMER LAPAROSCOPE (MISCELLANEOUS) ×1 IMPLANT
WATER STERILE IRR 1000ML POUR (IV SOLUTION) ×1 IMPLANT

## 2023-07-21 NOTE — Progress Notes (Signed)
 ED Antimicrobial Stewardship Positive Culture Follow Up   Carolyn Castro is an 43 y.o. female who presented to [facility] on 07/18/2023 with a chief complaint of  Chief Complaint  Patient presents with   Abdominal Pain    Recent Results (from the past 720 hours)  Resp panel by RT-PCR (RSV, Flu A&B, Covid) Anterior Nasal Swab     Status: None   Collection Time: 06/23/23  5:57 AM   Specimen: Anterior Nasal Swab  Result Value Ref Range Status   SARS Coronavirus 2 by RT PCR NEGATIVE NEGATIVE Final    Comment: (NOTE) SARS-CoV-2 target nucleic acids are NOT DETECTED.  The SARS-CoV-2 RNA is generally detectable in upper respiratory specimens during the acute phase of infection. The lowest concentration of SARS-CoV-2 viral copies this assay can detect is 138 copies/mL. A negative result does not preclude SARS-Cov-2 infection and should not be used as the sole basis for treatment or other patient management decisions. A negative result may occur with  improper specimen collection/handling, submission of specimen other than nasopharyngeal swab, presence of viral mutation(s) within the areas targeted by this assay, and inadequate number of viral copies(<138 copies/mL). A negative result must be combined with clinical observations, patient history, and epidemiological information. The expected result is Negative.  Fact Sheet for Patients:  bloggercourse.com  Fact Sheet for Healthcare Providers:  seriousbroker.it  This test is no t yet approved or cleared by the [place]  FDA and  has been authorized for detection and/or diagnosis of SARS-CoV-2 by FDA under an Emergency Use Authorization (EUA). This EUA will remain  in effect (meaning this test can be used) for the duration of the COVID-19 declaration under Section 564(b)(1) of the Act, 21 U.S.C.section 360bbb-3(b)(1), unless the authorization is terminated  or revoked sooner.        Influenza A by PCR NEGATIVE NEGATIVE Final   Influenza B by PCR NEGATIVE NEGATIVE Final    Comment: (NOTE) The Xpert Xpress SARS-CoV-2/FLU/RSV plus assay is intended as an aid in the diagnosis of influenza from Nasopharyngeal swab specimens and should not be used as a sole basis for treatment. Nasal washings and aspirates are unacceptable for Xpert Xpress SARS-CoV-2/FLU/RSV testing.  Fact Sheet for Patients: bloggercourse.com  Fact Sheet for Healthcare Providers: seriousbroker.it  This test is not yet approved or cleared by the [place]  FDA and has been authorized for detection and/or diagnosis of SARS-CoV-2 by FDA under an Emergency Use Authorization (EUA). This EUA will remain in effect (meaning this test can be used) for the duration of the COVID-19 declaration under Section 564(b)(1) of the Act, 21 U.S.C. section 360bbb-3(b)(1), unless the authorization is terminated or revoked.     Resp Syncytial Virus by PCR NEGATIVE NEGATIVE Final    Comment: (NOTE) Fact Sheet for Patients: bloggercourse.com  Fact Sheet for Healthcare Providers: seriousbroker.it  This test is not yet approved or cleared by the [place]  FDA and has been authorized for detection and/or diagnosis of SARS-CoV-2 by FDA under an Emergency Use Authorization (EUA). This EUA will remain in effect (meaning this test can be used) for the duration of the COVID-19 declaration under Section 564(b)(1) of the Act, 21 U.S.C. section 360bbb-3(b)(1), unless the authorization is terminated or revoked.  Performed at [facility], [address] [place], [place] [address]   Aerobic/Anaerobic Culture w Gram Stain (surgical/deep wound)     Status: None   Collection Time: 06/23/23 11:41 AM   Specimen: Path fluid; Body Fluid  Result Value Ref Range Status   Specimen Description   Final     FLUID Performed at [facility], [address] [place], [place] [address]    Special Requests   Final    NONE Performed at [facility], [address] [place], [place] [address]    Gram Stain   Final    ABUNDANT WBC PRESENT, PREDOMINANTLY PMN ABUNDANT GRAM POSITIVE COCCI RARE GRAM NEGATIVE RODS    Culture   Final    MODERATE ESCHERICHIA COLI MODERATE STREPTOCOCCUS ANGINOSIS ABUNDANT BACTEROIDES THETAIOTAOMICRON BETA LACTAMASE POSITIVE Performed at [facility], [address], [place], [place] [address]    Report Status 06/27/2023 FINAL  Final   Organism ID, Bacteria ESCHERICHIA COLI  Final   Organism ID, Bacteria STREPTOCOCCUS ANGINOSIS  Final      Susceptibility   Escherichia coli - MIC*    AMPICILLIN  8 SENSITIVE Sensitive     CEFEPIME  <=0.12 SENSITIVE Sensitive     CEFTAZIDIME <=1 SENSITIVE Sensitive     CEFTRIAXONE  <=0.25 SENSITIVE Sensitive     CIPROFLOXACIN <=0.25 SENSITIVE Sensitive     GENTAMICIN <=1 SENSITIVE Sensitive     IMIPENEM <=0.25 SENSITIVE Sensitive     TRIMETH /SULFA  <=20 SENSITIVE Sensitive     AMPICILLIN /SULBACTAM <=2 SENSITIVE Sensitive     PIP/TAZO <=4 SENSITIVE Sensitive ug/mL    * MODERATE ESCHERICHIA COLI   Streptococcus anginosis - MIC*    PENICILLIN <=0.06 SENSITIVE Sensitive     CEFTRIAXONE  0.25 SENSITIVE Sensitive     ERYTHROMYCIN <=0.12 SENSITIVE Sensitive     LEVOFLOXACIN  1 SENSITIVE Sensitive     VANCOMYCIN  0.5 SENSITIVE Sensitive     * MODERATE STREPTOCOCCUS ANGINOSIS  Resp panel by RT-PCR (RSV, Flu A&B, Covid) Anterior Nasal Swab     Status: None   Collection Time: 07/18/23  6:45 AM   Specimen: Anterior Nasal Swab  Result Value Ref Range Status   SARS Coronavirus 2 by RT PCR NEGATIVE NEGATIVE Final    Comment: (NOTE) SARS-CoV-2 target nucleic acids are NOT DETECTED.  The SARS-CoV-2 RNA is generally detectable in upper respiratory specimens during the acute phase of infection. The  lowest concentration of SARS-CoV-2 viral copies this assay can detect is 138 copies/mL. A negative result does not preclude SARS-Cov-2 infection and should not be used as the sole basis for treatment or other patient management decisions. A negative result may occur with  improper specimen collection/handling, submission of specimen other than nasopharyngeal swab, presence of viral mutation(s) within the areas targeted by this assay, and inadequate number of viral copies(<138 copies/mL). A negative result must be combined with clinical observations, patient history, and epidemiological information. The expected result is Negative.  Fact Sheet for Patients:  bloggercourse.com  Fact Sheet for Healthcare Providers:  seriousbroker.it  This test is no t yet approved or cleared by the [place]  FDA and  has been authorized for detection and/or diagnosis of SARS-CoV-2 by FDA under an Emergency Use Authorization (EUA). This EUA will remain  in effect (meaning this test can be used) for the duration of the COVID-19 declaration under Section 564(b)(1) of the Act, 21 U.S.C.section 360bbb-3(b)(1), unless the authorization is terminated  or revoked sooner.       Influenza A by PCR NEGATIVE NEGATIVE Final   Influenza B by PCR NEGATIVE NEGATIVE Final    Comment: (NOTE) The Xpert Xpress SARS-CoV-2/FLU/RSV plus assay is intended as an aid in the diagnosis of influenza from Nasopharyngeal swab specimens and should  not be used as a sole basis for treatment. Nasal washings and aspirates are unacceptable for Xpert Xpress SARS-CoV-2/FLU/RSV testing.  Fact Sheet for Patients: bloggercourse.com  Fact Sheet for Healthcare Providers: seriousbroker.it  This test is not yet approved or cleared by the [place]  FDA and has been authorized for detection and/or diagnosis of SARS-CoV-2 by FDA under  an Emergency Use Authorization (EUA). This EUA will remain in effect (meaning this test can be used) for the duration of the COVID-19 declaration under Section 564(b)(1) of the Act, 21 U.S.C. section 360bbb-3(b)(1), unless the authorization is terminated or revoked.     Resp Syncytial Virus by PCR NEGATIVE NEGATIVE Final    Comment: (NOTE) Fact Sheet for Patients: bloggercourse.com  Fact Sheet for Healthcare Providers: seriousbroker.it  This test is not yet approved or cleared by the [place]  FDA and has been authorized for detection and/or diagnosis of SARS-CoV-2 by FDA under an Emergency Use Authorization (EUA). This EUA will remain in effect (meaning this test can be used) for the duration of the COVID-19 declaration under Section 564(b)(1) of the Act, 21 U.S.C. section 360bbb-3(b)(1), unless the authorization is terminated or revoked.  Performed at [facility], [address] [place], [place] [address]   Urine Culture     Status: Abnormal   Collection Time: 07/18/23  7:17 AM   Specimen: Urine, Clean Catch  Result Value Ref Range Status   Specimen Description   Final    URINE, CLEAN CATCH Performed at [facility], [address] [place], [place] [address]    Special Requests   Final    NONE Performed at [facility], [address] [place], [place] [address]    Culture 30,000 COLONIES/mL ENTEROCOCCUS FAECALIS (A)  Final   Report Status 07/20/2023 FINAL  Final   Organism ID, Bacteria ENTEROCOCCUS FAECALIS (A)  Final      Susceptibility   Enterococcus faecalis - MIC*    AMPICILLIN  <=2 SENSITIVE Sensitive     NITROFURANTOIN <=16 SENSITIVE Sensitive     VANCOMYCIN  1 SENSITIVE Sensitive     * 30,000 COLONIES/mL ENTEROCOCCUS FAECALIS    [x]  Treated with keflex , organism resistant to prescribed antimicrobial   New antibiotic prescription: Amoxicillin  875mg  BID  ED Provider:  Lamar Salen MD   Koren CROME Jeanet Lupe 07/21/2023, 8:27 AM Infectious Diseases Pharmacist Phone# [phone]

## 2023-07-21 NOTE — Telephone Encounter (Signed)
Post ED Visit - Positive Culture Follow-up: Unsuccessful Patient Follow-up  Culture assessed and recommendations reviewed by:  [x]  Ruben Im, Pharm.D. []  Celedonio Miyamoto, [address].D., BCPS AQ-ID []  Garvin Fila, Pharm.D., BCPS []  Georgina Pillion, [address].D., BCPS []  [place], [address].D., BCPS, AAHIVP []  Estella Husk, Pharm.D., BCPS, AAHIVP []  Sherlynn Carbon, PharmD []  Pollyann Samples, PharmD, BCPS  Positive urine culture  []  Patient discharged without antimicrobial prescription and treatment is now indicated [x]  Organism is resistant to prescribed ED discharge antimicrobial []  Patient with positive blood cultures Per Dr Jeraldine Loots recommendations, Patient is advise to stop cephalexin and start amoxicillin 875 mg  BID x 5 days. Letter mailed.  Unable to contact patient after 3 attempts, letter will be sent to address on file  Carolyn Castro 07/21/2023, 1:13 PM

## 2023-07-21 NOTE — H&P (Signed)
[name] is an 43 y.o. female.   Chief Complaint: RUQ pain HPI: Carolyn Castro is a 43 yo female who presents with RUQ abdominal pain. She had an I&D of a perirectal abscess about a month ago. At her postop visit on 1/7, she had been having upper abdominal pain for about 4-5 days. This pain is focused more in the RUQ, and gets worse after eating. She had a RUQ [place] on 1/8 that showed sludge in the gallbladder but no stones. She also began having fevers and went to the ED on 1/14. A CT scan did not show any acute abnormalities, however she had evidence of a UTI and was started on antibiotics (urine cultures were positive). She has not had further fevers since then, but continues to have RUQ pain. It is every day and gets worse with eating. She presents today for cholecystectomy.  She has not had any prior abdominal surgeries.   Past Medical History:  Diagnosis Date   Angio-edema    ankles   Anxiety    Asthma    Back pain    Constipation    Fatigue    GERD (gastroesophageal reflux disease)    Hypertension    Hypothyroid    Hypothyroidism    Sleep apnea    CPAP    Past Surgical History:  Procedure Laterality Date   BACK SURGERY     COLONOSCOPY     ESOPHAGOGASTRODUODENOSCOPY (EGD) WITH PROPOFOL N/A 01/28/2019   Procedure: ESOPHAGOGASTRODUODENOSCOPY (EGD) WITH PROPOFOL;  Surgeon: Carman Ching, MD;  Location: [facility];  Service: Endoscopy;  Laterality: N/A;   INCISION AND DRAINAGE PERIRECTAL ABSCESS N/A 06/23/2023   Procedure: IRRIGATION AND DEBRIDEMENT PERIRECTAL ABSCESS;  Surgeon: Fritzi Mandes, MD;  Location: [facility];  Service: General;  Laterality: N/A;   LAPAROSCOPIC OVARIAN CYSTECTOMY Left 06/29/2018   Procedure: LAPAROSCOPIC OVARIAN CYSTECTOMY;  Surgeon: Gerald Leitz, MD;  Location: [facility];  Service: Gynecology;  Laterality: Left;   TONSILLECTOMY     WISDOM TOOTH EXTRACTION      Family History  Problem Relation Age of Onset   Hypertension Mother     Hypertension Father    Adrenal disorder Father        uncertain type   Social History:  reports that she has never smoked. She has never been exposed to tobacco smoke. She has never used smokeless tobacco. She reports that she does not drink alcohol and does not use drugs.  Allergies:  Allergies  Allergen Reactions   Ciprofloxacin Hcl Diarrhea and Other (See Comments)    Developed C-Diff   Fluticasone Propionate     Headaches   Triamcinolone Acetonide      rash on face    Medications Prior to Admission  Medication Sig Dispense Refill   acetaminophen (TYLENOL) 500 MG tablet Take 2 tablets (1,000 mg total) by mouth every 6 (six) hours as needed for moderate pain. 30 tablet 0   albuterol (VENTOLIN HFA) 108 (90 Base) MCG/ACT inhaler Inhale 2 puffs into the lungs every 6 (six) hours as needed for wheezing or shortness of breath.     ALPRAZolam (XANAX) 1 MG tablet Take 1 tablet (1 mg total) by mouth 2 (two) times daily as needed for anxiety. 30 tablet 5   amLODipine (NORVASC) 5 MG tablet Take 5 mg by mouth daily.     cephALEXin (KEFLEX) 500 MG capsule Take 1 capsule (500 mg total) by mouth 2 (two) times daily for 5 days. 10 capsule 0  CRANBERRY PO Take 1 capsule by mouth in the morning and at bedtime.     diphenhydrAMINE (BENADRYL) 25 mg capsule Take 25 mg by mouth every 6 (six) hours as needed for allergies.     fexofenadine (ALLEGRA) 180 MG tablet Take 180 mg by mouth daily.     ibuprofen (ADVIL) 200 MG tablet Take 800 mg by mouth every 6 (six) hours as needed for moderate pain (pain score 4-6). 4 tablets as needed     levothyroxine (SYNTHROID) 75 MCG tablet Take 75 mcg by mouth daily before breakfast.     montelukast (SINGULAIR) 10 MG tablet Take 1 tablet (10 mg total) by mouth every evening. 90 tablet 0   Multiple Vitamins-Minerals (MULTIVITAMIN WOMEN) TABS Take 1 tablet by mouth daily.     norethindrone-ethinyl estradiol (LOESTRIN 1/20, 21,) 1-20 MG-MCG tablet Take 1 tablet by mouth  daily.     omeprazole (PRILOSEC) 40 MG capsule Take 40 mg by mouth 2 (two) times daily.     ondansetron (ZOFRAN-ODT) 4 MG disintegrating tablet Take 1 tablet (4 mg total) by mouth every 8 (eight) hours as needed. 20 tablet 0   oxyCODONE (ROXICODONE) 5 MG immediate release tablet Take 1 tablet (5 mg total) by mouth every 6 (six) hours as needed for up to 10 doses. 10 tablet 0   PARoxetine (PAXIL) 30 MG tablet Take 2 tablets (60 mg total) by mouth at bedtime. 180 tablet 1   phentermine (ADIPEX-P) 37.5 MG tablet Take 37.5 mg by mouth daily before breakfast.     topiramate (TOPAMAX) 50 MG tablet Take 50 mg by mouth daily.     metFORMIN (GLUCOPHAGE-XR) 500 MG 24 hr tablet Take 500 mg by mouth daily.      No results found for this or any previous visit (from the past 48 hours). No results found.  Review of Systems  Blood pressure (!) 145/92, pulse 97, temperature 98.9 F (37.2 C), temperature source Oral, resp. rate 18, height 5\' 4"  (1.626 m), weight 120.2 kg, SpO2 96%. Physical Exam Vitals reviewed.  Constitutional:      General: She is not in acute distress.    Appearance: Normal appearance.  Eyes:     General: No scleral icterus.    Pupils: Pupils are equal, round, and reactive to light.  Pulmonary:     Effort: Pulmonary effort is normal. No respiratory distress.  Abdominal:     General: There is no distension.     Palpations: Abdomen is soft.     Comments: Mild RUQ tenderness to palpation.  Musculoskeletal:        General: Normal range of motion.  Skin:    General: Skin is warm and dry.     Coloration: Skin is not jaundiced.  Neurological:     General: No focal deficit present.     Mental Status: She is alert and oriented to person, place, and time.      Assessment/Plan 43 yo female with severe postprandial RUQ abdominal pain, consistent with biliary colic. RUQ US shows gallbladder sludge, and recent CT scan did not show any acute abnormalities to explain her pain. She has  been treated for a UTI, and continues to have symptoms consistent with biliary colic. Proceed to the OR for a laparoscopic cholecystectomy. I reviewed the procedure details, including the benefits and the risks of bleeding, infection, post-cholecystectomy diarrhea, and <0.5% risk of common bile duct injury. The patient expressed understanding and agrees to proceed with surgery. The postoperative restrictions and anticipated  recovery course were also reviewed and all questions were answered. Plan for discharge home from PACU.  Fritzi Mandes, MD 07/21/2023, 11:05 AM

## 2023-07-21 NOTE — Transfer of Care (Signed)
Immediate Anesthesia Transfer of Care Note  Patient: Carolyn Castro  Procedure(s) Performed: LAPAROSCOPIC CHOLECYSTECTOMY  Patient Location: PACU  Anesthesia Type:General  Level of Consciousness: awake, drowsy, and patient cooperative  Airway & Oxygen Therapy: Patient Spontanous Breathing  Post-op Assessment: Report given to RN and Post -op Vital signs reviewed and stable  Post vital signs: Reviewed and stable  Last Vitals:  Vitals Value Taken Time  BP 158/83 1434  Temp    Pulse 114 07/21/23 1432  Resp    SpO2 95 % 07/21/23 1432  Vitals shown include unfiled device data.  Last Pain:  Vitals:   07/21/23 1101  TempSrc:   PainSc: 10-Worst pain ever         Complications: No notable events documented.

## 2023-07-21 NOTE — Anesthesia Preprocedure Evaluation (Addendum)
Anesthesia Evaluation  Patient identified by MRN, date of birth, ID band Patient awake    Reviewed: Allergy & Precautions, NPO status , Patient's Chart, lab work & pertinent test results  History of Anesthesia Complications Negative for: history of anesthetic complications  Airway Mallampati: III  TM Distance: >3 FB Neck ROM: Full    Dental  (+) Teeth Intact, Dental Advisory Given   Pulmonary asthma , sleep apnea and Continuous Positive Airway Pressure Ventilation , Not current smoker   Pulmonary exam normal breath sounds clear to auscultation       Cardiovascular hypertension, Pt. on medications Normal cardiovascular exam Rhythm:Regular Rate:Normal     Neuro/Psych  PSYCHIATRIC DISORDERS Anxiety     negative neurological ROS     GI/Hepatic Neg liver ROS,GERD  Medicated,,PERIRECTAL ABSCESS   Endo/Other  Hypothyroidism  Class 3 obesity  Renal/GU negative Renal ROS     Musculoskeletal negative musculoskeletal ROS (+)    Abdominal  (+) + obese  Peds  Hematology negative hematology ROS (+)   Anesthesia Other Findings Day of surgery medications reviewed with the patient.  Reproductive/Obstetrics                             Anesthesia Physical Anesthesia Plan  ASA: 3  Anesthesia Plan: General   Post-op Pain Management: Tylenol PO (pre-op)* and Celebrex PO (pre-op)*   Induction: Intravenous  PONV Risk Score and Plan: 3 and Midazolam, Dexamethasone, Ondansetron and Treatment may vary due to age or medical condition  Airway Management Planned: Oral ETT  Additional Equipment:   Intra-op Plan:   Post-operative Plan: Extubation in OR  Informed Consent: I have reviewed the patients History and Physical, chart, labs and discussed the procedure including the risks, benefits and alternatives for the proposed anesthesia with the patient or authorized representative who has indicated his/her  understanding and acceptance.     Dental advisory given  Plan Discussed with: CRNA  Anesthesia Plan Comments:        Anesthesia Quick Evaluation

## 2023-07-21 NOTE — Discharge Instructions (Addendum)
[facility] DISCHARGE INSTRUCTIONS  Activity No heavy lifting greater than 15 pounds for 4 weeks after surgery. Ok to shower in 24 hours, but do not bathe or submerge incisions underwater. Do not drive while taking narcotic pain medication. You may drive when you are no longer taking prescription pain medication, you can comfortably wear a seatbelt, and you can safely maneuver your car and apply brakes.  Wound Care Your incisions are covered with skin glue called Dermabond. This will peel off on its own over time. You may shower and allow warm soapy water to run over your incisions. Gently pat dry. Do not submerge your incision underwater until cleared by your surgeon. Monitor your incision for any new redness, tenderness, or drainage. Many patients will experience some swelling and bruising at the incisions.  Ice packs will help.  Swelling and bruising can take several days to resolve.   Medications A  prescription for pain medication may be given to you upon discharge.  Take your pain medication as prescribed, if needed.  If narcotic pain medicine is not needed, then you may take acetaminophen (Tylenol) or ibuprofen (Advil) as needed. It is common to experience some constipation if taking pain medication after surgery.  Increasing fluid intake and taking a stool softener (such as Colace) will usually help or prevent this problem from occurring.  A mild laxative (Milk of Magnesia or Miralax) should be taken according to package directions if there are no bowel movements after 48 hours. Take your usually prescribed medications unless otherwise directed. If you need a refill on your pain medication, please contact your pharmacy.  They will contact our office to request authorization. Prescriptions will not be filled after 5 pm or on weekends.  When to Call us: Fever greater than 100.5 New redness, drainage, or swelling at incision site Severe pain, nausea, or  vomiting Persistent bleeding from incisions Jaundice (yellowing of the whites of the eyes or skin)  Follow-up You have an appointment scheduled with Dr. Freida Busman on August 23, 2023 at 10:50am. This will be at the [facility] office at [address], [address], [place], [place]. Please arrive at least 15 minutes prior to your scheduled appointment time.  IF YOU HAVE DISABILITY OR FAMILY LEAVE FORMS, YOU MUST BRING THEM TO THE OFFICE FOR PROCESSING.   DO NOT GIVE THEM TO YOUR DOCTOR.  The clinic staff is available to answer your questions during regular business hours.  Please don't hesitate to call and ask to speak to one of the nurses for clinical concerns.  If you have a medical emergency, go to the nearest emergency room or call 911.  A surgeon from [facility] is always on call at the hospital  [address], [place], [place]  [address] ?  [address] [address], [place], [place]   [address] [phone] ? Toll Free: [phone] ? FAX [phone] Web site: [email or web address]      Managing Your Pain After Surgery Without Opioids    Thank you for participating in our program to help patients manage their pain after surgery without opioids. This is part of our effort to provide you with the best care possible, without exposing you or your family to the risk that opioids pose.  What pain can I expect after surgery? You can expect to have some pain after surgery. This is normal. The pain is typically worse the day after surgery, and quickly begins to get better. Many studies have  found that many patients are able to manage their pain after surgery with Over-the-Counter (OTC) medications such as Tylenol and Motrin. If you have a condition that does not allow you to take Tylenol or Motrin, notify your surgical team.  How will I manage my pain? The best strategy for controlling your pain after surgery is around the clock pain control with  Tylenol (acetaminophen) and Motrin (ibuprofen or Advil). Alternating these medications with each other allows you to maximize your pain control. In addition to Tylenol and Motrin, you can use heating pads or ice packs on your incisions to help reduce your pain.  How will I alternate your regular strength over-the-counter pain medication? You will take a dose of pain medication every three hours. Start by taking 650 mg of Tylenol (2 pills of 325 mg) 3 hours later take 600 mg of Motrin (3 pills of 200 mg) 3 hours after taking the Motrin take 650 mg of Tylenol 3 hours after that take 600 mg of Motrin.   - 1 -  See example - if your first dose of Tylenol is at 12:00 PM   12:00 PM Tylenol 650 mg (2 pills of 325 mg)  3:00 PM Motrin 600 mg (3 pills of 200 mg)  6:00 PM Tylenol 650 mg (2 pills of 325 mg)  9:00 PM Motrin 600 mg (3 pills of 200 mg)  Continue alternating every 3 hours   We recommend that you follow this schedule around-the-clock for at least 3 days after surgery, or until you feel that it is no longer needed. Use the table on the last page of this handout to keep track of the medications you are taking. Important: Do not take more than 3000mg  of Tylenol or 3200mg  of Motrin in a 24-hour period. Do not take ibuprofen/Motrin if you have a history of bleeding stomach ulcers, severe kidney disease, &/or actively taking a blood thinner  What if I still have pain? If you have pain that is not controlled with the over-the-counter pain medications (Tylenol and Motrin or Advil) you might have what we call "breakthrough" pain. You will receive a prescription for a small amount of an opioid pain medication such as Oxycodone, Tramadol, or Tylenol with Codeine. Use these opioid pills in the first 24 hours after surgery if you have breakthrough pain. Do not take more than 1 pill every 4-6 hours.  If you still have uncontrolled pain after using all opioid pills, don't hesitate to call our staff  using the number provided. We will help make sure you are managing your pain in the best way possible, and if necessary, we can provide a prescription for additional pain medication.   Day 1    Time  Name of Medication Number of pills taken  Amount of Acetaminophen  Pain Level   Comments  AM PM       AM PM       AM PM       AM PM       AM PM       AM PM       AM PM       AM PM       Total Daily amount of Acetaminophen Do not take more than  3,000 mg per day      Day 2    Time  Name of Medication Number of pills taken  Amount of Acetaminophen  Pain Level   Comments  AM PM  AM PM       AM PM       AM PM       AM PM       AM PM       AM PM       AM PM       Total Daily amount of Acetaminophen Do not take more than  3,000 mg per day      Day 3    Time  Name of Medication Number of pills taken  Amount of Acetaminophen  Pain Level   Comments  AM PM       AM PM       AM PM       AM PM         AM PM       AM PM       AM PM       AM PM       Total Daily amount of Acetaminophen Do not take more than  3,000 mg per day      Day 4    Time  Name of Medication Number of pills taken  Amount of Acetaminophen  Pain Level   Comments  AM PM       AM PM       AM PM       AM PM       AM PM       AM PM       AM PM       AM PM       Total Daily amount of Acetaminophen Do not take more than  3,000 mg per day      Day 5    Time  Name of Medication Number of pills taken  Amount of Acetaminophen  Pain Level   Comments  AM PM       AM PM       AM PM       AM PM       AM PM       AM PM       AM PM       AM PM       Total Daily amount of Acetaminophen Do not take more than  3,000 mg per day      Day 6    Time  Name of Medication Number of pills taken  Amount of Acetaminophen  Pain Level  Comments  AM PM       AM PM       AM PM       AM PM       AM PM       AM PM       AM PM       AM PM       Total Daily amount of  Acetaminophen Do not take more than  3,000 mg per day      Day 7    Time  Name of Medication Number of pills taken  Amount of Acetaminophen  Pain Level   Comments  AM PM       AM PM       AM PM       AM PM       AM PM       AM PM       AM PM       AM PM  Total Daily amount of Acetaminophen Do not take more than  3,000 mg per day        For additional information about how and where to safely dispose of unused opioid medications - [email or web address]  Disclaimer: This document contains information and/or instructional materials adapted from [facility] for the typical patient with your condition. It does not replace medical advice from your health care provider because your experience may differ from that of the typical patient. Talk to your health care provider if you have any questions about this document, your condition or your treatment plan. Adapted from [facility]

## 2023-07-21 NOTE — Anesthesia Procedure Notes (Signed)
Procedure Name: Intubation Date/Time: 07/21/2023 1:35 PM  Performed by: Yolonda Kida, CRNAPre-anesthesia Checklist: Patient identified, Emergency Drugs available, Suction available and Patient being monitored Patient Re-evaluated:Patient Re-evaluated prior to induction Oxygen Delivery Method: Circle System Utilized Preoxygenation: Pre-oxygenation with 100% oxygen Induction Type: IV induction Ventilation: Mask ventilation without difficulty and Oral airway inserted - appropriate to patient size Laryngoscope Size: Mac and 3 Grade View: Grade I Tube type: Oral Tube size: 7.0 mm Number of attempts: 1 Airway Equipment and Method: Stylet and Oral airway Placement Confirmation: ETT inserted through vocal cords under direct vision, positive ETCO2 and breath sounds checked- equal and bilateral Secured at: 21 cm Tube secured with: Tape Dental Injury: Teeth and Oropharynx as per pre-operative assessment

## 2023-07-21 NOTE — Op Note (Signed)
Date: 07/21/23  Patient: Carolyn Castro MRN: [id number]  Preoperative Diagnosis: Biliary colic Postoperative Diagnosis: Same  Procedure:  Laparoscopic cholecystectomy Liver biopsy  Surgeon: Sophronia Simas, MD  EBL: Minimal  Anesthesia: General endotracheal  Specimens:  Gallbladder Liver nodule  Indications: Carolyn Castro is a 43 yo female who has been having severe RUQ abdominal pain for the last several weeks. The pain is exacerbated by eating. A RUQ US showed gallbladder sludge without signs of acute cholecystitis, and a CT scan did not show any acute abnormalities. After a discussion of the risks and benefits of surgery, she agreed to proceed with cholecystectomy.  Findings: No evidence of acute cholecystitis. Small subcentimeter nodule on the surface of the dome of the liver, benign in appearance, excised with a biopsy forceps.  Procedure details: Informed consent was obtained in the preoperative area prior to the procedure. The patient was brought to the operating room and placed on the table in the supine position. General anesthesia was induced and appropriate lines and drains were placed for intraoperative monitoring. Perioperative antibiotics were administered per SCIP guidelines. The abdomen was prepped and draped in the usual sterile fashion. A pre-procedure timeout was taken verifying patient identity, surgical site and procedure to be performed.  A small supraumbilical skin incision was made, the subcutaneous tissue was bluntly spread, and the umbilical stalk was grasped and elevated. A Veress needle was inserted through the fascia, intraperitoneal placement was confirmed with the saline drop test, and the abdomen was insufflated. A 12mm trocar was placed and the abdomen was inspected with no evidence of visceral or vascular injury. Three 5mm ports were placed in the right subcostal margin, all under direct visualization. There was a small nodule, measuring less then 0.5cm in  diameter, on the surface of the dome of the liver. The appearance was most consistent with a benign hamartoma or hemangioma. The fundus of the gallbladder was grasped and retracted cephalad. There were some omental adhesions to the infundibulum of the gallbladder, which were taken down with cautery. The infundibulum was then retracted laterally. The peritoneum overlying the gallbladder was incised with cautery, and the cystic triangle was dissected out using cautery and blunt dissection. The critical view of safety was obtained. The cystic duct and cystic artery were clipped and ligated, leaving three clips behind on the cystic duct stump. Clips were also placed on the posterior branch of the cystic artery, which was then divided with cautery. The gallbladder was taken off the liver using cautery, and the specimen was placed in an endocatch bag. The surgical site was irrigated with saline until the effluent was clear. Hemostasis was achieved in the gallbladder fossa using cautery. The cystic duct and artery stumps were visually inspected and there was no evidence of bile leak or bleeding.   A biopsy forceps was then used to excise the nodule on the dome of the liver, which was sent for routine pathology. Hemostasis was achieved at the biopsy site with cautery. The gallbladder was extracted via the umbilical port site. The umbilical port site fascia was then closed with a 0 Vicryl figure-of-eight suture using a PMI. The remaining ports were removed and the abdomen was desufflated. The skin at all port sites was closed with 4-0 monocryl subcuticular suture. Dermabond was applied.  The patient tolerated the procedure well with no apparent complications. All counts were correct x2 at the end of the procedure. The patient was extubated and taken to PACU in stable condition.  Sophronia Simas, MD 07/21/23  2:23 PM

## 2023-07-21 NOTE — Anesthesia Postprocedure Evaluation (Signed)
Anesthesia Post Note  Patient: Carolyn Castro  Procedure(s) Performed: LAPAROSCOPIC CHOLECYSTECTOMY     Patient location during evaluation: PACU Anesthesia Type: General Level of consciousness: awake and alert Pain management: pain level controlled Vital Signs Assessment: post-procedure vital signs reviewed and stable Respiratory status: spontaneous breathing, nonlabored ventilation and respiratory function stable Cardiovascular status: blood pressure returned to baseline and stable Postop Assessment: no apparent nausea or vomiting Anesthetic complications: no   No notable events documented.  Last Vitals:  Vitals:   07/21/23 1445 07/21/23 1500  BP: (!) 154/78 (!) 145/88  Pulse: (!) 105 (!) 104  Resp: (!) 22 20  Temp:  36.7 C  SpO2: 92% 96%    Last Pain:  Vitals:   07/21/23 1500  TempSrc:   PainSc: 0-No pain                 Lowella Curb

## 2023-07-22 ENCOUNTER — Encounter (HOSPITAL_COMMUNITY): Payer: Self-pay | Admitting: Surgery

## 2023-07-24 LAB — SURGICAL PATHOLOGY

## 2023-07-26 ENCOUNTER — Other Ambulatory Visit: Payer: Self-pay

## 2023-07-26 ENCOUNTER — Emergency Department (HOSPITAL_BASED_OUTPATIENT_CLINIC_OR_DEPARTMENT_OTHER): Payer: 59

## 2023-07-26 ENCOUNTER — Emergency Department (HOSPITAL_BASED_OUTPATIENT_CLINIC_OR_DEPARTMENT_OTHER)
Admission: EM | Admit: 2023-07-26 | Discharge: 2023-07-26 | Disposition: A | Discharge status: Home / Self Care | Payer: 59 | Attending: Emergency Medicine | Admitting: Emergency Medicine

## 2023-07-26 ENCOUNTER — Encounter (HOSPITAL_BASED_OUTPATIENT_CLINIC_OR_DEPARTMENT_OTHER): Payer: Self-pay

## 2023-07-26 DIAGNOSIS — R109 Unspecified abdominal pain: Secondary | ICD-10-CM | POA: Diagnosis present

## 2023-07-26 DIAGNOSIS — Z1152 Encounter for screening for COVID-19: Secondary | ICD-10-CM | POA: Diagnosis not present

## 2023-07-26 DIAGNOSIS — G8918 Other acute postprocedural pain: Secondary | ICD-10-CM | POA: Diagnosis not present

## 2023-07-26 LAB — COMPREHENSIVE METABOLIC PANEL
ALT: 29 U/L (ref 0–44)
AST: 24 U/L (ref 15–41)
Albumin: 3.7 g/dL (ref 3.5–5.0)
Alkaline Phosphatase: 60 U/L (ref 38–126)
Anion gap: 8 (ref 5–15)
BUN: 8 mg/dL (ref 6–20)
CO2: 20 mmol/L — ABNORMAL LOW (ref 22–32)
Calcium: 9.1 mg/dL (ref 8.9–10.3)
Chloride: 105 mmol/L (ref 98–111)
Creatinine, Ser: 1.01 mg/dL — ABNORMAL HIGH (ref 0.44–1.00)
GFR, Estimated: >60 mL/min (ref >60)
Glucose, Bld: 82 mg/dL (ref 70–99)
Potassium: 3.6 mmol/L (ref 3.5–5.1)
Sodium: 133 mmol/L — ABNORMAL LOW (ref 135–145)
Total Bilirubin: 0.7 mg/dL (ref 0.0–1.2)
Total Protein: 7.7 g/dL (ref 6.5–8.1)

## 2023-07-26 LAB — CBC WITH DIFFERENTIAL/PLATELET
Abs Immature Granulocytes: 0.02 10*3/uL (ref 0.00–0.07)
Basophils Absolute: 0 10*3/uL (ref 0.0–0.1)
Basophils Relative: 1 %
Eosinophils Absolute: 0.1 10*3/uL (ref 0.0–0.5)
Eosinophils Relative: 1 %
HCT: 43.3 % (ref 36.0–46.0)
Hemoglobin: 14.8 g/dL (ref 12.0–15.0)
Immature Granulocytes: 0 %
Lymphocytes Relative: 27 %
Lymphs Abs: 2.1 10*3/uL (ref 0.7–4.0)
MCH: 31.3 pg (ref 26.0–34.0)
MCHC: 34.2 g/dL (ref 30.0–36.0)
MCV: 91.5 fL (ref 80.0–100.0)
Monocytes Absolute: 0.4 10*3/uL (ref 0.1–1.0)
Monocytes Relative: 5 %
Neutro Abs: 5.3 10*3/uL (ref 1.7–7.7)
Neutrophils Relative %: 66 %
Platelets: 368 10*3/uL (ref 150–400)
RBC: 4.73 MIL/uL (ref 3.87–5.11)
RDW: 12.4 % (ref 11.5–15.5)
WBC: 7.9 10*3/uL (ref 4.0–10.5)
nRBC: 0 % (ref 0.0–0.2)

## 2023-07-26 LAB — LACTIC ACID, PLASMA: Lactic Acid, Venous: 0.8 mmol/L (ref 0.5–1.9)

## 2023-07-26 LAB — RESP PANEL BY RT-PCR (RSV, FLU A&B, COVID)  RVPGX2
Influenza A by PCR: NEGATIVE
Influenza B by PCR: NEGATIVE
Resp Syncytial Virus by PCR: NEGATIVE
SARS Coronavirus 2 by RT PCR: NEGATIVE

## 2023-07-26 LAB — LIPASE, BLOOD: Lipase: 26 U/L (ref 11–51)

## 2023-07-26 MED ORDER — PROCHLORPERAZINE EDISYLATE 10 MG/2ML IJ SOLN
10.0000 mg | Freq: Once | INTRAMUSCULAR | Status: AC
  Administered 2023-07-26: 10 mg via INTRAVENOUS
  Filled 2023-07-26: qty 2

## 2023-07-26 MED ORDER — OXYCODONE HCL 10 MG PO TABS: 10.0000 mg | ORAL_TABLET | Freq: Four times a day (QID) | ORAL | 0 refills | Status: DC | PRN

## 2023-07-26 MED ORDER — IOHEXOL 300 MG/ML  SOLN
100.0000 mL | Freq: Once | INTRAMUSCULAR | Status: AC | PRN
  Administered 2023-07-26: 100 mL via INTRAVENOUS

## 2023-07-26 MED ORDER — ONDANSETRON HCL 4 MG/2ML IJ SOLN
4.0000 mg | Freq: Once | INTRAMUSCULAR | Status: AC
Start: 2023-07-26 — End: 2023-07-26
  Administered 2023-07-26: 4 mg via INTRAVENOUS
  Filled 2023-07-26: qty 2

## 2023-07-26 MED ORDER — ONDANSETRON 4 MG PO TBDP: ORAL_TABLET | ORAL | 0 refills | Status: AC

## 2023-07-26 MED ORDER — FENTANYL CITRATE PF 50 MCG/ML IJ SOSY
50.0000 ug | PREFILLED_SYRINGE | Freq: Once | INTRAMUSCULAR | Status: AC
  Administered 2023-07-26: 50 ug via INTRAVENOUS
  Filled 2023-07-26: qty 1

## 2023-07-26 MED ORDER — LACTATED RINGERS IV BOLUS
1000.0000 mL | Freq: Once | INTRAVENOUS | Status: AC
  Administered 2023-07-26: 1000 mL via INTRAVENOUS

## 2023-07-26 MED ORDER — HYDROMORPHONE HCL 1 MG/ML IJ SOLN
1.0000 mg | Freq: Once | INTRAMUSCULAR | Status: AC
  Administered 2023-07-26: 1 mg via INTRAVENOUS
  Filled 2023-07-26: qty 1

## 2023-07-26 NOTE — ED Provider Notes (Signed)
[place] [facility] Provider Note   CSN: [id number] Arrival date & time: 07/26/23  1052     History  Chief Complaint  Patient presents with   Post-op Problem    Carolyn Castro is a 43 y.o. female.  HPI   43 year old female with medical history significant for recent surgical procedure for perirectal abscess, recent cholecystectomy performed on 07/21/2023 5 days ago who presents to the emergency department with abdominal pain, chills, myalgias and pain at her incision sites.  The patient is worried she is developing a postop infection.  She endorses worsening ecchymosis around her surgical site, mild redness at her incision sites.  On chart review, the patient has underwent multiple surgical procedures in the past month.  She underwent incision and drainage of a perirectal abscess on 06/23/2023 and underwent laparoscopic cholecystectomy.  She has been experiencing worsening abdominal pain, 8/10 in severity, diffusely throughout her abdomen. She endorses nausea. Patient is moving her bowels with a last bowel movement yesterday which was normal.  Home Medications Prior to Admission medications   Medication Sig Start Date End Date Taking? Authorizing Provider  acetaminophen (TYLENOL) 500 MG tablet Take 2 tablets (1,000 mg total) by mouth every 6 (six) hours as needed for moderate pain. 03/18/23   Fayrene Helper, PA-C  albuterol (VENTOLIN HFA) 108 (90 Base) MCG/ACT inhaler Inhale 2 puffs into the lungs every 6 (six) hours as needed for wheezing or shortness of breath.    [provider]  ALPRAZolam Prudy Feeler) 1 MG tablet Take 1 tablet (1 mg total) by mouth 2 (two) times daily as needed for anxiety. 03/31/23   Cottle, Steva Ready., MD  amLODipine (NORVASC) 5 MG tablet Take 5 mg by mouth daily.    [provider]  CRANBERRY PO Take 1 capsule by mouth in the morning and at bedtime.    [provider]  diphenhydrAMINE (BENADRYL) 25 mg  capsule Take 25 mg by mouth every 6 (six) hours as needed for allergies.    [provider]  fexofenadine (ALLEGRA) 180 MG tablet Take 180 mg by mouth daily.    [provider]  ibuprofen (ADVIL) 200 MG tablet Take 800 mg by mouth every 6 (six) hours as needed for moderate pain (pain score 4-6). 4 tablets as needed    [provider]  levothyroxine (SYNTHROID) 75 MCG tablet Take 75 mcg by mouth daily before breakfast.    [provider]  metFORMIN (GLUCOPHAGE-XR) 500 MG 24 hr tablet Take 500 mg by mouth daily. 01/15/23   [provider]  montelukast (SINGULAIR) 10 MG tablet Take 1 tablet (10 mg total) by mouth every evening. 04/01/22     Multiple Vitamins-Minerals (MULTIVITAMIN WOMEN) TABS Take 1 tablet by mouth daily.    [provider]  norethindrone-ethinyl estradiol (LOESTRIN 1/20, 21,) 1-20 MG-MCG tablet Take 1 tablet by mouth daily.    [provider]  omeprazole (PRILOSEC) 40 MG capsule Take 40 mg by mouth 2 (two) times daily.    [provider]  ondansetron (ZOFRAN-ODT) 4 MG disintegrating tablet Take 1 tablet (4 mg total) by mouth every 8 (eight) hours as needed. 07/18/23   Curatolo, Adam, DO  oxyCODONE (ROXICODONE) 5 MG immediate release tablet Take 1 tablet (5 mg total) by mouth every 6 (six) hours as needed for up to 5 days for severe pain (pain score 7-10). 07/21/23 07/26/23  Fritzi Mandes, MD  PARoxetine (PAXIL) 30 MG tablet Take 2 tablets (  60 mg total) by mouth at bedtime. 03/09/23   Cottle, Steva Ready., MD  phentermine (ADIPEX-P) 37.5 MG tablet Take 37.5 mg by mouth daily before breakfast.    [provider]  topiramate (TOPAMAX) 50 MG tablet Take 50 mg by mouth daily. 01/15/23   [provider]      Allergies    Ciprofloxacin hcl, Fluticasone propionate, and Triamcinolone acetonide    Review of Systems   Review of Systems  Gastrointestinal:  Positive for abdominal pain and nausea.  All other  systems reviewed and are negative.   Physical Exam Updated Vital Signs BP 136/82   Pulse 87   Temp 98.3 F (36.8 C)   Resp 20   Ht 5\' 4"  (1.626 m)   Wt 120.2 kg   LMP  (LMP Unknown) Comment: upt neg on 07/21/2023  SpO2 100%   BMI 45.49 kg/m  Physical Exam Vitals and nursing note reviewed.  Constitutional:      General: She is not in acute distress.    Appearance: She is well-developed.  HENT:     Head: Normocephalic and atraumatic.  Eyes:     Conjunctiva/sclera: Conjunctivae normal.  Cardiovascular:     Rate and Rhythm: Normal rate and regular rhythm.  Pulmonary:     Effort: Pulmonary effort is normal. No respiratory distress.     Breath sounds: Normal breath sounds.  Abdominal:     Palpations: Abdomen is soft.     Tenderness: There is abdominal tenderness. There is no guarding.     Comments: Incision sites with mild surrounding ecchymoses, mild serous discharge, no erythema. Generalized abdominal TTP present.  Musculoskeletal:        General: No swelling.     Cervical back: Neck supple.  Skin:    General: Skin is warm and dry.     Capillary Refill: Capillary refill takes less than 2 seconds.  Neurological:     Mental Status: She is alert.  Psychiatric:        Mood and Affect: Mood normal.     ED Results / Procedures / Treatments   Labs (all labs ordered are listed, but only abnormal results are displayed) Labs Reviewed  COMPREHENSIVE METABOLIC PANEL - Abnormal; Notable for the following components:      Result Value   Sodium 133 (*)    CO2 20 (*)    Creatinine, Ser 1.01 (*)    All other components within normal limits  RESP PANEL BY RT-PCR (RSV, FLU A&B, COVID)  RVPGX2  CULTURE, BLOOD (ROUTINE X 2)  CBC WITH DIFFERENTIAL/PLATELET  LACTIC ACID, PLASMA  LIPASE, BLOOD    EKG None  Radiology CT ABDOMEN PELVIS W CONTRAST Result Date: 07/26/2023 CLINICAL DATA:  Abdominal pain, postop. Status post cholecystectomy. EXAM: CT ABDOMEN AND PELVIS WITH  CONTRAST TECHNIQUE: Multidetector CT imaging of the abdomen and pelvis was performed using the standard protocol following bolus administration of intravenous contrast. RADIATION DOSE REDUCTION: This exam was performed according to the departmental dose-optimization program which includes automated exposure control, adjustment of the mA and/or kV according to patient size and/or use of iterative reconstruction technique. CONTRAST:  OMNIPAQUE IOHEXOL 300 MG/ML  SOLN COMPARISON:  CT of the abdomen and pelvis with contrast 07/18/2023. Right upper quadrant ultrasound 07/12/2023. FINDINGS: Lower chest: Mild dependent atelectasis is present bilaterally. The lungs are otherwise clear. Heart size is normal. No significant pleural or pericardial effusion is present. A pectus excavatum deformity is present. Hepatobiliary: No focal liver abnormality is seen.  Status post cholecystectomy. No biliary dilatation. Surgical clips are present at the gallbladder fossa. Minimal inflammatory changes are likely within normal limits following surgery. No significant fluid collection or air is present. The common bile duct is within normal limits. Pancreas: Unremarkable. No pancreatic ductal dilatation or surrounding inflammatory changes. Spleen: Normal in size without focal abnormality. Adrenals/Urinary Tract: Adrenal glands are unremarkable. Kidneys are normal, without renal calculi, focal lesion, or hydronephrosis. Bladder is unremarkable. Stomach/Bowel: The stomach and duodenum are within normal limits. Small bowel is unremarkable. The terminal ileum is normal. Appendix is visualized and normal. Vascular/Lymphatic: No significant vascular findings are present. No enlarged abdominal or pelvic lymph nodes. Reproductive: Uterus and bilateral adnexa are unremarkable. Other: No abdominal wall hernia or abnormality. No abdominopelvic ascites. Mild inflammatory changes are present at the trocar sites, as expected. Minimal skin  thickening is noted over the right upper quadrant entry site. No fluid collection or abscess is present. Musculoskeletal: Lumbar fusion is noted at L5-S1. No significant listhesis is present in the lumbar spine. Lumbar lordosis is within normal limits. No focal osseous lesions are present. Mild degenerative changes are present in the hips, right greater than left. IMPRESSION: 1. Status post cholecystectomy. 2. Minimal inflammatory changes are likely within normal limits following surgery. No significant fluid collection or air is present. 3. Mild inflammatory changes at the trocar sites, as expected. 4. Pectus excavatum deformity. 5. Lumbar fusion at L5-S1. Electronically Signed   By: Marin Roberts M.D.   On: 07/26/2023 13:42    Procedures Procedures    Medications Ordered in ED Medications  lactated ringers bolus 1,000 mL (0 mLs Intravenous Stopped 07/26/23 1421)  fentaNYL (SUBLIMAZE) injection 50 mcg (50 mcg Intravenous Given 07/26/23 1155)  ondansetron (ZOFRAN) injection 4 mg (4 mg Intravenous Given 07/26/23 1156)  iohexol (OMNIPAQUE) 300 MG/ML solution 100 mL (100 mLs Intravenous Contrast Given 07/26/23 1304)  HYDROmorphone (DILAUDID) injection 1 mg (1 mg Intravenous Given 07/26/23 1422)  prochlorperazine (COMPAZINE) injection 10 mg (10 mg Intravenous Given 07/26/23 1424)    ED Course/ Medical Decision Making/ A&P                                 Medical Decision Making Amount and/or Complexity of Data Reviewed Labs: ordered. Radiology: ordered.  Risk Prescription drug management.    43 year old female with medical history significant for recent surgical procedure for perirectal abscess, recent cholecystectomy performed on 07/21/2023 5 days ago who presents to the emergency department with abdominal pain, chills, myalgias and pain at her incision sites.  The patient is worried she is developing a postop infection.  She endorses worsening ecchymosis around her surgical site, mild  redness at her incision sites.  On chart review, the patient has underwent multiple surgical procedures in the past month.  She underwent incision and drainage of a perirectal abscess on 06/23/2023 and underwent laparoscopic cholecystectomy.  She has been experiencing worsening abdominal pain, 8/10 in severity, diffusely throughout her abdomen. She endorses nausea. Patient is moving her bowels with a last bowel movement yesterday which was normal.  On arrival, the patient was afebrile, temperature 99, heart rate 110, sinus tachycardia noted on cardiac telemetry, RR 22, BP 130/94, saturating 98% on room air.  Physical exam as per above, mild generalized tenderness to palpation of the abdomen, no guarding, Incision sites with mild surrounding ecchymoses, mild serous discharge, no erythema.   Concern for postop infection versus postoperative pain.  No clear infection of the patient's incision sites, no palpable subcutaneous abscess, no erythema surrounding and serous drainage only noted.  Carney Bern is endorsing chills, considered viral infection.  Considered bile leak in the setting of the patient's severe pain.  Lower concern for small bowel obstruction postoperatively as the patient is moving her bowels with a last bowel movement yesterday.  Laboratory evaluation revealed a lactic acid of 0.8, lipase normal, CBC without a leukocytosis or anemia, CMP generally unremarkable, mild acidosis with a bicarbonate of 20, normal anion gap of 8, no LFT abnormality and a normal T. bili.  COVID-19, influenza, RSV PCR testing was collected and resulted negative.  Blood cultures were collected x 2 and pending.  IV access was obtained and the patient was administered an IV fluid bolus with for volume resuscitation with resultant improvement in her heart rate.  She was administered fentanyl for pain control and Zofran 4 mg for nausea however she continued to endorse severe pain 8 out of 10 on repeat assessment with associated  nausea.  She was administered additional Compazine for nausea and Dilaudid 1 mg.  CT Abdomen Pelvis:  IMPRESSION:  1. Status post cholecystectomy.  2. Minimal inflammatory changes are likely within normal limits  following surgery. No significant fluid collection or air is  present.  3. Mild inflammatory changes at the trocar sites, as expected.  4. Pectus excavatum deformity.  5. Lumbar fusion at L5-S1.   General surgery consulted given the patient's ongoing postoperative severe pain.  Consult pending at time of signout, signout given to Dr. Silverio Lay at 1500.  Ultimate disposition pending results of reassessment, pain control, general surgery consult.   Final Clinical Impression(s) / ED Diagnoses Final diagnoses:  Post-operative pain    Rx / DC Orders ED Discharge Orders     None         Ernie Avena, MD 07/26/23 1447

## 2023-07-26 NOTE — ED Provider Notes (Signed)
  Physical Exam  BP 136/82   Pulse 87   Temp 98.3 F (36.8 C)   Resp 20   Ht 5\' 4"  (1.626 m)   Wt 120.2 kg   LMP  (LMP Unknown) Comment: upt neg on 07/21/2023  SpO2 100%   BMI 45.49 kg/m   Physical Exam  Procedures  Procedures  ED Course / MDM    Medical Decision Making Care assumed at 3 pm.  Patient has been having right upper quadrant pain and is 5 days postop from cholecystectomy.  White blood cell count is normal.  CT is unremarkable.  Patient's pain is under control after pain medicine.  Signout pending surgery consult  3:51 PM I discussed with Michael from surgery.  He reviewed the images.  He was able to get her appointment tomorrow.  I will refill her pain medicine and nausea medicine.  Patient states that she is feeling better and would like to go home.  Gave strict return precautions   Problems Addressed: Post-operative pain: acute illness or injury  Amount and/or Complexity of Data Reviewed Labs: ordered. Decision-making details documented in ED Course. Radiology: ordered and independent interpretation performed. Decision-making details documented in ED Course.  Risk Prescription drug management.          Charlynne Pander, MD 07/26/23 [phone]

## 2023-07-26 NOTE — ED Triage Notes (Signed)
Pt c/o redness & pain around surgical site. Pt reports having gallbladder removed last Friday.

## 2023-07-26 NOTE — ED Notes (Signed)
Pt given coke for PO challenge per Dr. Silverio Lay.   Robina Ade

## 2023-07-26 NOTE — Discharge Instructions (Addendum)
I have increased your oxycodone to 10 mg every 6 hours as needed  Take Tylenol or Motrin for mild pain  Take Zofran for nausea   You have appointment with Dr. Freida Busman tomorrow in her office  Return to ER if you have worsening abdominal pain or vomiting or fever

## 2023-07-31 ENCOUNTER — Encounter (HOSPITAL_BASED_OUTPATIENT_CLINIC_OR_DEPARTMENT_OTHER): Payer: Self-pay | Admitting: Emergency Medicine

## 2023-07-31 ENCOUNTER — Other Ambulatory Visit: Payer: Self-pay

## 2023-07-31 ENCOUNTER — Emergency Department (HOSPITAL_BASED_OUTPATIENT_CLINIC_OR_DEPARTMENT_OTHER)
Admission: EM | Admit: 2023-07-31 | Discharge: 2023-07-31 | Disposition: A | Discharge status: Home / Self Care | Payer: 59 | Attending: Emergency Medicine | Admitting: Emergency Medicine

## 2023-07-31 DIAGNOSIS — T8131XA Disruption of external operation (surgical) wound, not elsewhere classified, initial encounter: Secondary | ICD-10-CM | POA: Insufficient documentation

## 2023-07-31 DIAGNOSIS — Z4801 Encounter for change or removal of surgical wound dressing: Secondary | ICD-10-CM | POA: Insufficient documentation

## 2023-07-31 DIAGNOSIS — T8130XA Disruption of wound, unspecified, initial encounter: Secondary | ICD-10-CM

## 2023-07-31 LAB — CULTURE, BLOOD (ROUTINE X 2)
Culture: NO GROWTH
Special Requests: ADEQUATE

## 2023-07-31 MED ORDER — CEPHALEXIN 500 MG PO CAPS: 500.0000 mg | ORAL_CAPSULE | Freq: Four times a day (QID) | ORAL | 0 refills | Status: DC

## 2023-07-31 MED ORDER — CEPHALEXIN 250 MG PO CAPS
500.0000 mg | ORAL_CAPSULE | Freq: Once | ORAL | Status: AC
  Administered 2023-07-31: 500 mg via ORAL
  Filled 2023-07-31: qty 2

## 2023-07-31 NOTE — ED Triage Notes (Signed)
Pt reports lap chole ~2 weeks ago. Reports umbilical wound came open last night. No drainage from wound. No redness or other signs of infection. Adipose visible. Pt requesting stitches for closure. Denies fever, or increase in pain to the area.

## 2023-07-31 NOTE — ED Provider Notes (Signed)
[place] [facility] Provider Note   CSN: [id number] Arrival date & time: 07/31/23  0865     History  Chief Complaint  Patient presents with   Wound Dehiscence    Carolyn Castro is a 43 y.o. female.  The history is provided by the patient.  Wound Check This is a new problem. The current episode started 2 days ago. The problem occurs constantly. The problem has not changed since onset.Pertinent negatives include no chest pain, no abdominal pain, no headaches and no shortness of breath. Nothing aggravates the symptoms. Nothing relieves the symptoms. She has tried nothing for the symptoms. The treatment provided no relief.  Patient with gall bladder surgery 1/17.  Umbilicus trocar incision opened      Home Medications Prior to Admission medications   Medication Sig Start Date End Date Taking? Authorizing Provider  cephALEXin (KEFLEX) 500 MG capsule Take 1 capsule (500 mg total) by mouth 4 (four) times daily. 07/31/23  Yes Perrin Eddleman, MD  acetaminophen (TYLENOL) 500 MG tablet Take 2 tablets (1,000 mg total) by mouth every 6 (six) hours as needed for moderate pain. 03/18/23   Fayrene Helper, PA-C  albuterol (VENTOLIN HFA) 108 (90 Base) MCG/ACT inhaler Inhale 2 puffs into the lungs every 6 (six) hours as needed for wheezing or shortness of breath.    [provider]  ALPRAZolam Prudy Feeler) 1 MG tablet Take 1 tablet (1 mg total) by mouth 2 (two) times daily as needed for anxiety. 03/31/23   Cottle, Steva Ready., MD  amLODipine (NORVASC) 5 MG tablet Take 5 mg by mouth daily.    [provider]  CRANBERRY PO Take 1 capsule by mouth in the morning and at bedtime.    [provider]  diphenhydrAMINE (BENADRYL) 25 mg capsule Take 25 mg by mouth every 6 (six) hours as needed for allergies.    [provider]  fexofenadine (ALLEGRA) 180 MG tablet Take 180 mg by mouth daily.    [provider]  ibuprofen (ADVIL) 200 MG  tablet Take 800 mg by mouth every 6 (six) hours as needed for moderate pain (pain score 4-6). 4 tablets as needed    [provider]  levothyroxine (SYNTHROID) 75 MCG tablet Take 75 mcg by mouth daily before breakfast.    [provider]  metFORMIN (GLUCOPHAGE-XR) 500 MG 24 hr tablet Take 500 mg by mouth daily. 01/15/23   [provider]  montelukast (SINGULAIR) 10 MG tablet Take 1 tablet (10 mg total) by mouth every evening. 04/01/22     Multiple Vitamins-Minerals (MULTIVITAMIN WOMEN) TABS Take 1 tablet by mouth daily.    [provider]  norethindrone-ethinyl estradiol (LOESTRIN 1/20, 21,) 1-20 MG-MCG tablet Take 1 tablet by mouth daily.    [provider]  omeprazole (PRILOSEC) 40 MG capsule Take 40 mg by mouth 2 (two) times daily.    [provider]  ondansetron (ZOFRAN-ODT) 4 MG disintegrating tablet 4mg  ODT q4 hours prn nausea/vomit 07/26/23   Charlynne Pander, MD  oxyCODONE 10 MG TABS Take 1 tablet (10 mg total) by mouth every 6 (six) hours as needed for severe pain (pain score 7-10). 07/26/23   Charlynne Pander, MD  PARoxetine (PAXIL) 30 MG tablet Take 2 tablets (60 mg total) by mouth at bedtime. 03/09/23   Cottle, Steva Ready., MD  phentermine (ADIPEX-P) 37.5 MG tablet Take 37.5 mg by mouth daily before breakfast.    [provider]  topiramate (  TOPAMAX) 50 MG tablet Take 50 mg by mouth daily. 01/15/23   [provider]      Allergies    Ciprofloxacin hcl, Fluticasone propionate, and Triamcinolone acetonide    Review of Systems   Review of Systems  Respiratory:  Negative for shortness of breath.   Cardiovascular:  Negative for chest pain.  Gastrointestinal:  Negative for abdominal pain.  Skin:  Positive for wound.  Neurological:  Negative for headaches.  All other systems reviewed and are negative.   Physical Exam Updated Vital Signs BP 106/79 (BP Location: Right Arm)   Pulse 88   Temp 97.8 F (36.6 C) (Oral)    Resp 18   Ht 5\' 4"  (1.626 m)   Wt 120.2 kg   LMP  (LMP Unknown) Comment: upt neg on 07/21/2023  SpO2 98%   BMI 45.49 kg/m  Physical Exam Vitals and nursing note reviewed.  Constitutional:      General: She is not in acute distress.    Appearance: She is well-developed.  HENT:     Head: Normocephalic and atraumatic.  Eyes:     Pupils: Pupils are equal, round, and reactive to light.  Cardiovascular:     Rate and Rhythm: Normal rate and regular rhythm.     Pulses: Normal pulses.     Heart sounds: Normal heart sounds.  Pulmonary:     Effort: Pulmonary effort is normal. No respiratory distress.     Breath sounds: Normal breath sounds.  Abdominal:     General: Bowel sounds are normal. There is no distension.     Palpations: Abdomen is soft.     Tenderness: There is no abdominal tenderness. There is no guarding or rebound.    Musculoskeletal:        General: Normal range of motion.     Cervical back: Neck supple.  Skin:    General: Skin is dry.     Capillary Refill: Capillary refill takes less than 2 seconds.     Findings: No erythema or rash.  Neurological:     General: No focal deficit present.     Deep Tendon Reflexes: Reflexes normal.  Psychiatric:        Mood and Affect: Mood normal.     ED Results / Procedures / Treatments   Labs (all labs ordered are listed, but only abnormal results are displayed) Labs Reviewed - No data to display  EKG None  Radiology No results found.  Procedures Procedures    Medications Ordered in ED Medications  cephALEXin (KEFLEX) capsule 500 mg (has no administration in time range)    ED Course/ Medical Decision Making/ A&P                                 Medical Decision Making Trocar of umbilicus dehisced 2 days ago from surgery 1/17  Amount and/or Complexity of Data Reviewed External Data Reviewed: notes.    Details: Previous notes reviewed   Risk Prescription drug management. Risk Details: Well appearing,  cannot be sutured at this time.  Antibiotics initiated.  Wound care instructions given call your surgeon.  Stable for discharge.      Final Clinical Impression(s) / ED Diagnoses Final diagnoses:  Wound dehiscence   I have reviewed the triage vital signs and the nursing notes. Pertinent labs & imaging results that were available during my care of the patient were reviewed by me and considered in my medical  decision making (see chart for details). After history, exam, and medical workup I feel the patient has been appropriately medically screened and is safe for discharge home. Pertinent diagnoses were discussed with the patient. Patient was given return precautions.   Rx / DC Orders ED Discharge Orders          Ordered    cephALEXin (KEFLEX) 500 MG capsule  4 times daily        07/31/23 0428              Dietrick Barris, MD 07/31/23 6045

## 2023-08-05 ENCOUNTER — Emergency Department (HOSPITAL_COMMUNITY): Payer: 59

## 2023-08-05 ENCOUNTER — Other Ambulatory Visit: Payer: Self-pay

## 2023-08-05 ENCOUNTER — Emergency Department (HOSPITAL_COMMUNITY)
Admission: EM | Admit: 2023-08-05 | Discharge: 2023-08-05 | Disposition: A | Discharge status: Home / Self Care | Payer: 59 | Attending: Emergency Medicine | Admitting: Emergency Medicine

## 2023-08-05 ENCOUNTER — Encounter (HOSPITAL_COMMUNITY): Payer: Self-pay

## 2023-08-05 DIAGNOSIS — T8131XA Disruption of external operation (surgical) wound, not elsewhere classified, initial encounter: Secondary | ICD-10-CM | POA: Insufficient documentation

## 2023-08-05 DIAGNOSIS — T8130XA Disruption of wound, unspecified, initial encounter: Secondary | ICD-10-CM

## 2023-08-05 DIAGNOSIS — R109 Unspecified abdominal pain: Secondary | ICD-10-CM

## 2023-08-05 DIAGNOSIS — R1013 Epigastric pain: Secondary | ICD-10-CM | POA: Diagnosis present

## 2023-08-05 LAB — CBC
HCT: 44.3 % (ref 36.0–46.0)
Hemoglobin: 14.8 g/dL (ref 12.0–15.0)
MCH: 31.1 pg (ref 26.0–34.0)
MCHC: 33.4 g/dL (ref 30.0–36.0)
MCV: 93.1 fL (ref 80.0–100.0)
Platelets: 421 10*3/uL — ABNORMAL HIGH (ref 150–400)
RBC: 4.76 MIL/uL (ref 3.87–5.11)
RDW: 12.1 % (ref 11.5–15.5)
WBC: 6.8 10*3/uL (ref 4.0–10.5)
nRBC: 0 % (ref 0.0–0.2)

## 2023-08-05 LAB — COMPREHENSIVE METABOLIC PANEL
ALT: 40 U/L (ref 0–44)
AST: 36 U/L (ref 15–41)
Albumin: 3.9 g/dL (ref 3.5–5.0)
Alkaline Phosphatase: 64 U/L (ref 38–126)
Anion gap: 10 (ref 5–15)
BUN: 13 mg/dL (ref 6–20)
CO2: 21 mmol/L — ABNORMAL LOW (ref 22–32)
Calcium: 9.5 mg/dL (ref 8.9–10.3)
Chloride: 105 mmol/L (ref 98–111)
Creatinine, Ser: 1.08 mg/dL — ABNORMAL HIGH (ref 0.44–1.00)
GFR, Estimated: >60 mL/min (ref >60)
Glucose, Bld: 87 mg/dL (ref 70–99)
Potassium: 3.8 mmol/L (ref 3.5–5.1)
Sodium: 136 mmol/L (ref 135–145)
Total Bilirubin: 0.5 mg/dL (ref 0.0–1.2)
Total Protein: 7.8 g/dL (ref 6.5–8.1)

## 2023-08-05 LAB — URINALYSIS, ROUTINE W REFLEX MICROSCOPIC
Bilirubin Urine: NEGATIVE
Glucose, UA: NEGATIVE mg/dL
Hgb urine dipstick: NEGATIVE
Ketones, ur: NEGATIVE mg/dL
Leukocytes,Ua: NEGATIVE
Nitrite: NEGATIVE
Protein, ur: NEGATIVE mg/dL
Specific Gravity, Urine: 1.006 (ref 1.005–1.030)
pH: 7 (ref 5.0–8.0)

## 2023-08-05 LAB — LACTIC ACID, PLASMA: Lactic Acid, Venous: 1.5 mmol/L (ref 0.5–1.9)

## 2023-08-05 LAB — HCG, SERUM, QUALITATIVE: Preg, Serum: NEGATIVE

## 2023-08-05 LAB — LIPASE, BLOOD: Lipase: 28 U/L (ref 11–51)

## 2023-08-05 MED ORDER — MORPHINE SULFATE (PF) 4 MG/ML IV SOLN
4.0000 mg | Freq: Once | INTRAVENOUS | Status: AC
  Administered 2023-08-05: 4 mg via INTRAMUSCULAR
  Filled 2023-08-05: qty 1

## 2023-08-05 MED ORDER — ACETAMINOPHEN 325 MG PO TABS: 650.0000 mg | ORAL_TABLET | Freq: Once | ORAL | Status: DC

## 2023-08-05 MED ORDER — IOHEXOL 300 MG/ML  SOLN
100.0000 mL | Freq: Once | INTRAMUSCULAR | Status: AC | PRN
  Administered 2023-08-05: 100 mL via INTRAVENOUS

## 2023-08-05 MED ORDER — ONDANSETRON 4 MG PO TBDP
4.0000 mg | ORAL_TABLET | Freq: Once | ORAL | Status: AC
  Administered 2023-08-05: 4 mg via ORAL
  Filled 2023-08-05: qty 1

## 2023-08-05 NOTE — ED Provider Triage Note (Signed)
Emergency Medicine Provider Triage Evaluation Note  Carolyn Castro , a 43 y.o. female  was evaluated in triage.  Pt complains of abdominal pain.  Patient reports that she had gallbladder surgery 2 weeks ago.  She states that she was evaluated at [facility] ED on the 27th and started on Keflex for cellulitis after one of her wound dehisced.  Reports persistent pain to her upper abdomen with fevers and nausea.  Review of Systems  Positive: Nausea, fevers, upper abdominal pain Negative: Back pain  Physical Exam  BP 138/75   Pulse (!) 114   Temp 98.4 F (36.9 C) (Oral)   Resp 16   Ht 5\' 4"  (1.626 m)   Wt 120.2 kg   LMP  (LMP Unknown) Comment: upt neg on 07/21/2023  SpO2 99%   BMI 45.49 kg/m  Gen:   Awake, no distress   Resp:  Normal effort  MSK:   Moves extremities without difficulty  Other:    Medical Decision Making  Medically screening exam initiated at 1:56 PM.  Appropriate orders placed.  Carolyn Castro was informed that the remainder of the evaluation will be completed by another provider, this initial triage assessment does not replace that evaluation, and the importance of remaining in the ED until their evaluation is complete.   Maxwell Marion, PA-C 08/05/23 1430

## 2023-08-05 NOTE — ED Provider Notes (Signed)
[facility] Castro EMERGENCY DEPARTMENT AT [facility] Provider Note   CSN: [id number] Arrival date & time: 08/05/23  1313     History  Chief Complaint  Patient presents with   Post-op Problem    Carolyn Castro is a 43 y.o. female.  HPI  Patient is a 67Y female presents the ED today status post cholecystectomy done 2 weeks ago and having recently visited the ED on 07/31/2023 for same issue of wound dehiscence.  Was prescribed Keflex that time and now endorses nausea.  States that she is also experiencing epigastric pain. Denies vomiting, fever, chest pain, shortness of breath, lower extremity swelling, laboratory pain, dysuria    Home Medications Prior to Admission medications   Medication Sig Start Date End Date Taking? Authorizing Provider  acetaminophen (TYLENOL) 500 MG tablet Take 2 tablets (1,000 mg total) by mouth every 6 (six) hours as needed for moderate pain. 03/18/23   Fayrene Helper, PA-C  albuterol (VENTOLIN HFA) 108 (90 Base) MCG/ACT inhaler Inhale 2 puffs into the lungs every 6 (six) hours as needed for wheezing or shortness of breath.    [provider]  ALPRAZolam Prudy Feeler) 1 MG tablet Take 1 tablet (1 mg total) by mouth 2 (two) times daily as needed for anxiety. 03/31/23   Cottle, Steva Ready., MD  amLODipine (NORVASC) 5 MG tablet Take 5 mg by mouth daily.    [provider]  cephALEXin (KEFLEX) 500 MG capsule Take 1 capsule (500 mg total) by mouth 4 (four) times daily. 07/31/23   Palumbo, April, MD  CRANBERRY PO Take 1 capsule by mouth in the morning and at bedtime.    [provider]  diphenhydrAMINE (BENADRYL) 25 mg capsule Take 25 mg by mouth every 6 (six) hours as needed for allergies.    [provider]  fexofenadine (ALLEGRA) 180 MG tablet Take 180 mg by mouth daily.    [provider]  ibuprofen (ADVIL) 200 MG tablet Take 800 mg by mouth every 6 (six) hours as needed for moderate pain (pain score 4-6). 4 tablets as  needed    [provider]  levothyroxine (SYNTHROID) 75 MCG tablet Take 75 mcg by mouth daily before breakfast.    [provider]  metFORMIN (GLUCOPHAGE-XR) 500 MG 24 hr tablet Take 500 mg by mouth daily. 01/15/23   [provider]  montelukast (SINGULAIR) 10 MG tablet Take 1 tablet (10 mg total) by mouth every evening. 04/01/22     Multiple Vitamins-Minerals (MULTIVITAMIN WOMEN) TABS Take 1 tablet by mouth daily.    [provider]  norethindrone-ethinyl estradiol (LOESTRIN 1/20, 21,) 1-20 MG-MCG tablet Take 1 tablet by mouth daily.    [provider]  omeprazole (PRILOSEC) 40 MG capsule Take 40 mg by mouth 2 (two) times daily.    [provider]  ondansetron (ZOFRAN-ODT) 4 MG disintegrating tablet 4mg  ODT q4 hours prn nausea/vomit 07/26/23   Charlynne Pander, MD  oxyCODONE 10 MG TABS Take 1 tablet (10 mg total) by mouth every 6 (six) hours as needed for severe pain (pain score 7-10). 07/26/23   Charlynne Pander, MD  PARoxetine (PAXIL) 30 MG tablet Take 2 tablets (60 mg total) by mouth at bedtime. 03/09/23   Cottle, Steva Ready., MD  phentermine (ADIPEX-P) 37.5 MG tablet Take 37.5 mg by mouth daily before breakfast.    [provider]  topiramate (TOPAMAX) 50 MG tablet Take 50 mg by mouth daily. 01/15/23   [provider]  Allergies    Ciprofloxacin hcl, Fluticasone propionate, and Triamcinolone acetonide    Review of Systems   Review of Systems  Physical Exam Updated Vital Signs BP 137/82   Pulse (!) 116   Temp 98.4 F (36.9 C) (Oral)   Resp 16   Ht 5\' 4"  (1.626 m)   Wt 120.2 kg   LMP  (LMP Unknown) Comment: upt neg on 07/21/2023  SpO2 94%   BMI 45.49 kg/m  Physical Exam Vitals and nursing note reviewed.  Constitutional:      General: She is not in acute distress.    Appearance: Normal appearance. She is not ill-appearing.  HENT:     Head: Normocephalic and atraumatic.  Eyes:     Extraocular Movements:  Extraocular movements intact.     Conjunctiva/sclera: Conjunctivae normal.  Cardiovascular:     Rate and Rhythm: Normal rate and regular rhythm.     Pulses: Normal pulses.     Heart sounds: Normal heart sounds. No murmur heard.    No friction rub. No gallop.  Pulmonary:     Effort: Pulmonary effort is normal. No respiratory distress.     Breath sounds: Normal breath sounds.  Abdominal:     General: Abdomen is flat. There is no distension.     Palpations: Abdomen is soft.     Tenderness: There is abdominal tenderness. There is no guarding or rebound.  Musculoskeletal:     Right lower leg: No edema.     Left lower leg: No edema.  Skin:    General: Skin is warm and dry.     Comments: 2 cm surgical wound noted above the umbilicus with slight erythema surrounding it and mild tender to palpation.  Neurological:     General: No focal deficit present.     Mental Status: She is alert. Mental status is at baseline.  Psychiatric:        Mood and Affect: Mood normal.     ED Results / Procedures / Treatments   Labs (all labs ordered are listed, but only abnormal results are displayed) Labs Reviewed  COMPREHENSIVE METABOLIC PANEL - Abnormal; Notable for the following components:      Result Value   CO2 21 (*)    Creatinine, Ser 1.08 (*)    All other components within normal limits  CBC - Abnormal; Notable for the following components:   Platelets 421 (*)    All other components within normal limits  HCG, SERUM, QUALITATIVE  LIPASE, BLOOD  URINALYSIS, ROUTINE W REFLEX MICROSCOPIC  LACTIC ACID, PLASMA    EKG None  Radiology CT ABDOMEN PELVIS W CONTRAST Result Date: 08/05/2023 CLINICAL DATA:  Postop abdominal pain, cholecystectomy 2 weeks ago. Umbilical pain. EXAM: CT ABDOMEN AND PELVIS WITH CONTRAST TECHNIQUE: Multidetector CT imaging of the abdomen and pelvis was performed using the standard protocol following bolus administration of intravenous contrast. RADIATION DOSE REDUCTION:  This exam was performed according to the departmental dose-optimization program which includes automated exposure control, adjustment of the mA and/or kV according to patient size and/or use of iterative reconstruction technique. CONTRAST:  OMNIPAQUE IOHEXOL 300 MG/ML  SOLN COMPARISON:  07/26/2023. FINDINGS: Lower chest: No acute findings. Heart size normal. No pericardial effusion. No pleural effusion. Distal esophagus Hepatobiliary: Liver is unremarkable. Cholecystectomy. No biliary ductal dilatation. Pancreas: Negative. Spleen: Negative. Adrenals/Urinary Tract: Adrenal glands and kidneys are unremarkable. Ureters are decompressed. Bladder is grossly unremarkable. Stomach/Bowel: Stomach, small bowel and colon are unremarkable. Appendix is not readily visualized. Vascular/Lymphatic: Vascular  structures are unremarkable. No pathologically enlarged lymph nodes. Reproductive: Uterus is visualized.  No adnexal mass. Other: No free fluid. Mesenteries and peritoneum are unremarkable. Postoperative haziness and soft tissue thickening deep to the umbilicus. No fluid collection to suggest abscess. Musculoskeletal: Minimal degenerative change in the spine. L5-S1 posterior lumbar interbody fusion. IMPRESSION: Expected postoperative soft tissue thickening and haziness in the subcutaneous fat, deep to the umbilicus. No abscess. Electronically Signed   By: Leanna Battles M.D.   On: 08/05/2023 16:41    Procedures Procedures    Medications Ordered in ED Medications  acetaminophen (TYLENOL) tablet 650 mg (has no administration in time range)  morphine (PF) 4 MG/ML injection 4 mg (4 mg Intramuscular Given 08/05/23 1422)  ondansetron (ZOFRAN-ODT) disintegrating tablet 4 mg (4 mg Oral Given 08/05/23 1423)  iohexol (OMNIPAQUE) 300 MG/ML solution 100 mL (100 mLs Intravenous Contrast Given 08/05/23 1620)    ED Course/ Medical Decision Making/ A&P                                 Medical Decision Making Amount and/or  Complexity of Data Reviewed Labs: ordered.   This patient is a 43 year old female who presents to the ED for concern of umbilical pain where stitches were removed from cholecystectomy.  Seen at [facility] was put on Keflex and now complains of nausea.   Differential diagnoses prior to evaluation: The emergent differential diagnosis includes, but is not limited to, wound dehiscence, cellulitis, dermatitis, abscess, sepsis. This is not an exhaustive differential.   Past Medical History / Co-morbidities / Social History: Status post cholecystectomy 2 weeks ago, chronic respiratory failure  Additional history: Chart reviewed. Pertinent results include:  Patient was recently seen for wound dehiscence on 07/31/2023 and was provided Keflex for possible underlying infection.    Patient is status post cholecystectomy done on 07/21/2023.    Lab Tests/Imaging studies: I personally interpreted labs/imaging and the pertinent results include:   CBC unremarkable Lactic acid unremarkable Lipase unremarkable UA unremarkable hCG serum qualitative unremarkable CMP unremarkable  CT abdomen and pelvis shows well-healing postsurgery I agree with the radiologist interpretation.    Medications: I ordered medication including Tylenol.  I have reviewed the patients home medicines and have made adjustments as needed.   ED Course:   Patient is a 41Y female presents the ED today status post cholecystectomy done 2 weeks ago and having recently visited the ED on 07/31/2023 for same issue of wound dehiscence.  Was prescribed Keflex that time and now endorses nausea.  States that she is also experiencing epigastric pain. Exam wound dehiscence was noted above the umbilicus.  Slight erythema was noted around the edges of the wound.  Area was tender.  Patient was also mildly tender to palpation in the epigastric abdominal area.  Exam was otherwise unremarkable.  Labs were unremarkable and CT scan also showed no  acute abnormality with well-healing surgical changes.  Low suspicion that any emergent process present this time.  Patient vitals upon reevaluation were normal.  Explained to patient the causes of antibiotics and may be the reason for nausea.  Recommended that she keep the wound covered and clean and finish antibiotic course.  Also recommend she reach out to her surgeon to see if they may see her sooner for wound dehiscence.  Wound care was provided here and Tylenol was also given for pain.  I believe patient safe to discharge at this time and  follow-up outpatient.   Disposition: After consideration of the diagnostic results and the patients response to treatment, I feel that this patient benefit from discharge treatment as above.   emergency department workup does not suggest an emergent condition requiring admission or immediate intervention beyond what has been performed at this time. The plan is: Symptomatic management, keep area clean, follow-up with surgeon, return for new or worsening symptoms. The patient is safe for discharge and has been instructed to return immediately for worsening symptoms, change in symptoms or any other concerns.    Final Clinical Impression(s) / ED Diagnoses Final diagnoses:  Wound dehiscence    Rx / DC Orders ED Discharge Orders     None         Lavonia Drafts 08/05/23 1856    Lonell Grandchild, MD 08/05/23 [phone]

## 2023-08-05 NOTE — Discharge Instructions (Signed)
You were seen today for wound dehiscence, this is where the surgical opening has begun to pull apart from the stitches.  It is continuing to heal well at this time however I will still recommend that you continue and finish the antibiotics previously given to you.  Your labs, imaging were both very reassuring at this time that no emergent processes present.  Recommend that you reach out to your surgeon to let him know that your surgical cut has been opened and see if they can see you sooner.  In the meantime, keep the wound clean and covered.  Wash daily.  Return to the ED if you begin experiencing any fever, worsening abdominal pain, shortness of breath, chest pain, weakness.

## 2023-08-05 NOTE — ED Triage Notes (Signed)
Pt had gallbladder removed 2 weeks ago. Pt reports she is having pain in umbilicus where stitches came out. Seen at [facility] and put on keflex. C/o nausea and not feeling well.

## 2023-08-08 ENCOUNTER — Other Ambulatory Visit (HOSPITAL_COMMUNITY): Payer: Self-pay

## 2023-08-30 ENCOUNTER — Other Ambulatory Visit: Payer: Self-pay | Admitting: Psychiatry

## 2023-08-30 DIAGNOSIS — F325 Major depressive disorder, single episode, in full remission: Secondary | ICD-10-CM

## 2023-08-30 DIAGNOSIS — F4001 Agoraphobia with panic disorder: Secondary | ICD-10-CM

## 2023-09-08 ENCOUNTER — Encounter: Payer: Self-pay | Admitting: Primary Care

## 2023-09-19 ENCOUNTER — Other Ambulatory Visit: Payer: Self-pay | Admitting: Psychiatry

## 2023-09-19 DIAGNOSIS — F5105 Insomnia due to other mental disorder: Secondary | ICD-10-CM

## 2023-09-20 ENCOUNTER — Encounter: Payer: Self-pay | Admitting: Emergency Medicine

## 2023-09-20 ENCOUNTER — Ambulatory Visit (INDEPENDENT_AMBULATORY_CARE_PROVIDER_SITE_OTHER): Admitting: Emergency Medicine

## 2023-09-20 VITALS — BP 112/76 | HR 101 | Ht 64.0 in | Wt 261.0 lb

## 2023-09-20 DIAGNOSIS — J3089 Other allergic rhinitis: Secondary | ICD-10-CM

## 2023-09-20 DIAGNOSIS — G4733 Obstructive sleep apnea (adult) (pediatric): Secondary | ICD-10-CM

## 2023-09-20 DIAGNOSIS — Z8616 Personal history of COVID-19: Secondary | ICD-10-CM

## 2023-09-20 DIAGNOSIS — J4541 Moderate persistent asthma with (acute) exacerbation: Secondary | ICD-10-CM

## 2023-09-20 NOTE — Assessment & Plan Note (Signed)
 Fairly good control.  She did have a flare in July when she had COVID.  Plan to continue [facility] for now.  We may be able to step this down.  She has improved overall with weight loss and believe that her asthma would be less intrusive as she continues to lose weight.

## 2023-09-20 NOTE — Assessment & Plan Note (Signed)
 Remains somewhat active but fairly good control on nasal steroid, Singulair, Allegra and nasal saline rinses.  Plan to continue same regimen.

## 2023-09-20 NOTE — Progress Notes (Signed)
   Subjective:    Patient ID: Carolyn Castro, female    DOB: 06-Jul-1980, 43 y.o.   MRN: [id number]  HPI  ROV 09/20/2023 --follow-up visit 43 year old woman.  She has a history of obesity, moderate persistent asthma, chronic upper irritation syndrome and cough in the setting of GERD and allergic rhinitis.  Also mild obstructive sleep apnea for which she is on CPAP.  She had a CPAP download that was confusing to interpret, seem to show that she wears the mask but full benefit, question whether there are apneas or leak.  She does not perceive this. Feels that she benefits from the mask, wears it 3-4 nights a week. Feels rested. She has been working on wt loss. Since I last saw her she has lost 80 lbs.  Unfortunate she had COVID-19 in July 2024, did take antivirals and was treated for associated bronchitis.  Her maintenance therapy is Breztri, Careers adviser, Singulair Nasonex Astelin nasal spray, nasal saline rinses as needed.  Also omeprazole 20 mg twice daily. She has daily nasal and sinus congestion. Sometimes pressure headaches. Can be associated w some cough. Uses albuterol 2x a month.   Review of Systems As per HPI     Objective:   Physical Exam  Vitals:   09/20/23 0822  BP: 112/76  Pulse: (!) 101  SpO2: 98%  Weight: 261 lb (118.4 kg)  Height: 5\' 4"  (1.626 m)   Gen: Pleasant, obese woman, in no distress,  normal affect  ENT: No lesions,  mouth clear,  oropharynx clear, no postnasal drip, some hoarse voice  Neck: No JVD, no stridor  Lungs: No use of accessory muscles, no crackles or wheezing on normal respiration, no wheeze on forced expiration  Cardiovascular: RRR, heart sounds normal, no murmur or gallops, no peripheral edema  Musculoskeletal: No deformities, no cyanosis or clubbing  Neuro: alert, awake, non focal  Skin: Warm, no lesions or rash     Assessment & Plan:  Obstructive sleep apnea She reports fairly good compliance, wears it about 4 nights out of the week.  On  the nights that she wears that she gets good clinical benefit.  Interestingly her compliance download has incomplete data, shows that she does wear the device but does not show whether she has leak, resolution of apneas.  Will need to work on this with her DME company.  She has all of her equipment.  Plan to continue and push compliance  Asthma Fairly good control.  She did have a flare in July when she had COVID.  Plan to continue [facility] for now.  We may be able to step this down.  She has improved overall with weight loss and believe that her asthma would be less intrusive as she continues to lose weight.  Allergic rhinitis Remains somewhat active but fairly good control on nasal steroid, Singulair, Allegra and nasal saline rinses.  Plan to continue same regimen.     Levy Pupa, MD, PhD 09/20/2023, 8:58 AM [facility] [phone] or if no answer [phone]

## 2023-09-20 NOTE — Assessment & Plan Note (Signed)
 She reports fairly good compliance, wears it about 4 nights out of the week.  On the nights that she wears that she gets good clinical benefit.  Interestingly her compliance download has incomplete data, shows that she does wear the device but does not show whether she has leak, resolution of apneas.  Will need to work on this with her DME company.  She has all of her equipment.  Plan to continue and push compliance

## 2023-09-20 NOTE — Patient Instructions (Signed)
 Congratulations on your weight loss and conditioning efforts.  Keep up the good work. Continue your CPAP.  Try to wear it every night.  We will work on confirming your compliance with your medical equipment company. Please continue Breztri 2 puffs twice a day.  Rinse and gargle after using. Keep albuterol available to use 2 puffs up to every 4 hours if needed for shortness of breath, chest tightness, wheezing.  Continue your Allegra, Singulair, Nasacort nasal spray as you have been taking them Continue nasal saline rinses Follow-up with APP in 6 months Follow Dr. Delton Coombes in 12 months, sooner if you have any problems.

## 2023-09-25 ENCOUNTER — Other Ambulatory Visit: Payer: Self-pay | Admitting: Surgery

## 2023-09-25 DIAGNOSIS — R1012 Left upper quadrant pain: Secondary | ICD-10-CM

## 2023-09-26 ENCOUNTER — Ambulatory Visit
Admission: RE | Admit: 2023-09-26 | Discharge: 2023-09-26 | Disposition: A | Discharge status: Home / Self Care | Source: Ambulatory Visit | Attending: Surgery | Admitting: Surgery

## 2023-09-26 DIAGNOSIS — R1012 Left upper quadrant pain: Secondary | ICD-10-CM

## 2023-09-26 MED ORDER — IOPAMIDOL (ISOVUE-370) INJECTION 76%
100.0000 mL | Freq: Once | INTRAVENOUS | Status: AC | PRN
  Administered 2023-09-26: 100 mL via INTRAVENOUS

## 2023-10-09 ENCOUNTER — Other Ambulatory Visit: Payer: Self-pay | Admitting: Psychiatry

## 2023-10-09 DIAGNOSIS — F4001 Agoraphobia with panic disorder: Secondary | ICD-10-CM

## 2023-10-10 ENCOUNTER — Encounter: Payer: Self-pay | Admitting: Emergency Medicine

## 2023-10-10 ENCOUNTER — Ambulatory Visit
Admission: EM | Admit: 2023-10-10 | Discharge: 2023-10-10 | Disposition: A | Discharge status: Home / Self Care | Attending: Emergency Medicine | Admitting: Emergency Medicine

## 2023-10-10 ENCOUNTER — Other Ambulatory Visit: Payer: Self-pay

## 2023-10-10 DIAGNOSIS — M25562 Pain in left knee: Secondary | ICD-10-CM

## 2023-10-10 MED ORDER — KETOROLAC TROMETHAMINE 30 MG/ML IJ SOLN
30.0000 mg | Freq: Once | INTRAMUSCULAR | Status: AC
  Administered 2023-10-10: 30 mg via INTRAMUSCULAR

## 2023-10-10 MED ORDER — PREDNISONE 10 MG (21) PO TBPK: ORAL_TABLET | Freq: Every day | ORAL | 0 refills | Status: DC

## 2023-10-10 NOTE — Discharge Instructions (Addendum)
 Your pain is most likely caused by irritation to the ligaments  You have been given injection of Toradol to help reduce internal inflammation and help with pain and ideally will start to see some relief within an hour  Starting tomorrow take prednisone every morning with food as directed to continue the above process  You have been given knee compression to wear when completing activity for stability and support  You may use heating pad in 15 minute intervals as needed for additional comfort, or  you may find comfort in using ice in 10-15 minutes over affected area  Begin stretching affected area daily for 10 minutes as tolerated to further loosen muscles   When lying down place pillow underneath and between knees for support  If pain persist after recommended treatment or reoccurs if may be beneficial to follow up with orthopedic specialist for evaluation, this doctor specializes in the bones and can manage your symptoms long-term with options such as but not limited to imaging, medications or physical therapy

## 2023-10-10 NOTE — ED Triage Notes (Signed)
 Patient presents to [facility] ([place]) for evaluation of left knee pain  2 weeks.  Denies known injury.  States it is constant, but worsens with movement/ambulation.  Tylenol and Ibuprofen have not helped at all.  Wants to make sure "nothing is seriously wrong".

## 2023-10-10 NOTE — ED Provider Notes (Signed)
 Renaldo Fiddler    CSN: [id number] Arrival date & time: 10/10/23  1924      History   Chief Complaint Chief Complaint  Patient presents with   Knee Pain    HPI FLORENCIA ZACCARO is a 43 y.o. female.   Patient presents for evaluation of constant left lateral knee pain present for 2 weeks.  Pain described as a pulsating and throbbing sensation with intermittent sharp shooting pains radiating down the lower extremity with associated numbness and tingling.  Symptoms exacerbated by standing and when the knee is bent.  Denies injury or trauma but endorses long periods of standing, walking and walking upstairs while at work.  Has attempted use of ibuprofen and Tylenol which has been ineffective.  Past Medical History:  Diagnosis Date   Angio-edema    ankles   Anxiety    Asthma    Back pain    Constipation    Fatigue    GERD (gastroesophageal reflux disease)    Hypertension    Hypothyroid    Hypothyroidism    Sleep apnea    CPAP    Patient Active Problem List   Diagnosis Date Noted   Acute bronchitis due to COVID-19 virus 01/23/2023   Hypertensive urgency 07/29/2021   Anxiety 07/29/2021   Nocturnal hypoxemia 11/04/2020   Hypocalcemia 05/14/2020   Acute viral syndrome 10/11/2019   Physical deconditioning 09/10/2019   Syncope and collapse 08/28/2019   Syncope 08/27/2019   Fever 08/27/2019   Flu-like symptoms 08/27/2019   Chronic hypoxemic respiratory failure (HCC) 08/27/2019   Altered mental status    Suspected COVID-19 virus infection 08/02/2019   Shortness of breath 05/09/2019   Acute recurrent frontal sinusitis 05/09/2019   Allergic rhinitis 02/20/2019   CAP (community acquired pneumonia) 01/02/2019   Normocytic anemia 01/02/2019   Hyperglycemia 01/02/2019   GERD (gastroesophageal reflux disease) 01/02/2019   Acute paraplegia (HCC) 12/26/2018   Lumbar stenosis with neurogenic claudication 12/26/2018   Obesity 12/26/2018   Essential hypertension 12/26/2018    Near syncope 12/26/2018   Lumbar stenosis 12/26/2018   Asthma 12/25/2018   Obstructive sleep apnea 12/25/2018   Low back pain 09/04/2018   Hypothyroidism 09/04/2018   Infected incision 07/04/2018   Adnexal mass 06/29/2018   Pelvic pain 06/29/2018    Past Surgical History:  Procedure Laterality Date   BACK SURGERY     CHOLECYSTECTOMY N/A 07/21/2023   Procedure: LAPAROSCOPIC CHOLECYSTECTOMY;  Surgeon: Fritzi Mandes, MD;  Location: [facility];  Service: General;  Laterality: N/A;   COLONOSCOPY     ESOPHAGOGASTRODUODENOSCOPY (EGD) WITH PROPOFOL N/A 01/28/2019   Procedure: ESOPHAGOGASTRODUODENOSCOPY (EGD) WITH PROPOFOL;  Surgeon: Carman Ching, MD;  Location: [facility];  Service: Endoscopy;  Laterality: N/A;   INCISION AND DRAINAGE PERIRECTAL ABSCESS N/A 06/23/2023   Procedure: IRRIGATION AND DEBRIDEMENT PERIRECTAL ABSCESS;  Surgeon: Fritzi Mandes, MD;  Location: [facility];  Service: General;  Laterality: N/A;   LAPAROSCOPIC OVARIAN CYSTECTOMY Left 06/29/2018   Procedure: LAPAROSCOPIC OVARIAN CYSTECTOMY;  Surgeon: Gerald Leitz, MD;  Location: [facility];  Service: Gynecology;  Laterality: Left;   TONSILLECTOMY     WISDOM TOOTH EXTRACTION      OB History     Gravida  0   Para  0   Term  0   Preterm  0   AB  0   Living  0      SAB  0   IAB  0   Ectopic  0   Multiple  0   Live Births  0            Home Medications    Prior to Admission medications   Medication Sig Start Date End Date Taking? Authorizing Provider  predniSONE (STERAPRED UNI-PAK 21 TAB) 10 MG (21) TBPK tablet Take by mouth daily. Take 6 tabs by mouth daily  for 1 days, then 5 tabs for 1 days, then 4 tabs for 1 days, then 3 tabs for 1 days, 2 tabs for 1 days, then 1 tab by mouth daily for 1 days 10/10/23  Yes Khale Nigh R, NP  acetaminophen (TYLENOL) 500 MG tablet Take 2 tablets (1,000 mg total) by mouth every 6 (six) hours as needed for moderate pain. 03/18/23   Fayrene Helper, PA-C   albuterol (VENTOLIN HFA) 108 (90 Base) MCG/ACT inhaler Inhale 2 puffs into the lungs every 6 (six) hours as needed for wheezing or shortness of breath.    [provider]  ALPRAZolam Prudy Feeler) 1 MG tablet TAKE 1 TABLET(1 MG) BY MOUTH TWICE DAILY AS NEEDED FOR ANXIETY 10/09/23   Cottle, Steva Ready., MD  amLODipine (NORVASC) 5 MG tablet Take 5 mg by mouth daily.    [provider]  cephALEXin (KEFLEX) 500 MG capsule Take 1 capsule (500 mg total) by mouth 4 (four) times daily. Patient not taking: Reported on 09/20/2023 07/31/23   Palumbo, April, MD  CRANBERRY PO Take 1 capsule by mouth in the morning and at bedtime.    [provider]  diphenhydrAMINE (BENADRYL) 25 mg capsule Take 25 mg by mouth every 6 (six) hours as needed for allergies.    [provider]  fexofenadine (ALLEGRA) 180 MG tablet Take 180 mg by mouth daily.    [provider]  ibuprofen (ADVIL) 200 MG tablet Take 800 mg by mouth every 6 (six) hours as needed for moderate pain (pain score 4-6). 4 tablets as needed    [provider]  levothyroxine (SYNTHROID) 75 MCG tablet Take 75 mcg by mouth daily before breakfast.    [provider]  metFORMIN (GLUCOPHAGE-XR) 500 MG 24 hr tablet Take 500 mg by mouth daily. 01/15/23   [provider]  montelukast (SINGULAIR) 10 MG tablet Take 1 tablet (10 mg total) by mouth every evening. 04/01/22     Multiple Vitamins-Minerals (MULTIVITAMIN WOMEN) TABS Take 1 tablet by mouth daily.    [provider]  norethindrone-ethinyl estradiol (LOESTRIN 1/20, 21,) 1-20 MG-MCG tablet Take 1 tablet by mouth daily.    [provider]  omeprazole (PRILOSEC) 40 MG capsule Take 40 mg by mouth 2 (two) times daily.    [provider]  ondansetron (ZOFRAN-ODT) 4 MG disintegrating tablet 4mg  ODT q4 hours prn nausea/vomit 07/26/23   Charlynne Pander, MD  oxyCODONE 10 MG TABS Take 1 tablet (10 mg total) by mouth every 6 (six) hours  as needed for severe pain (pain score 7-10). Patient not taking: Reported on 09/20/2023 07/26/23   Charlynne Pander, MD  PARoxetine (PAXIL) 30 MG tablet TAKE 2 TABLETS(60 MG) BY MOUTH AT BEDTIME 08/30/23   Cottle, Steva Ready., MD  phentermine (ADIPEX-P) 37.5 MG tablet Take 37.5 mg by mouth daily before breakfast.    [provider]  topiramate (TOPAMAX) 50 MG tablet Take 50 mg by mouth daily. 01/15/23   [provider]  traZODone (DESYREL) 150 MG tablet TAKE 1 TABLET(150 MG) BY MOUTH AT BEDTIME 09/19/23   Cottle, Steva Ready., MD    Family  History Family History  Problem Relation Age of Onset   Hypertension Mother    Hypertension Father    Adrenal disorder Father        uncertain type    Social History Social History   Tobacco Use   Smoking status: Never    Passive exposure: Never   Smokeless tobacco: Never  Vaping Use   Vaping status: Never Used  Substance Use Topics   Alcohol use: Never   Drug use: Never     Allergies   Ciprofloxacin hcl, Fluticasone propionate, and Triamcinolone acetonide   Review of Systems Review of Systems   Physical Exam Triage Vital Signs ED Triage Vitals  Encounter Vitals Group     BP 10/10/23 1934 135/87     Systolic BP Percentile --      Diastolic BP Percentile --      Pulse Rate 10/10/23 1934 85     Resp 10/10/23 1934 18     Temp 10/10/23 1934 98.2 F (36.8 C)     Temp Source 10/10/23 1934 Oral     SpO2 10/10/23 1934 98 %     Weight --      Height --      Head Circumference --      Peak Flow --      Pain Score 10/10/23 1935 4     Pain Loc --      Pain Education --      Exclude from Growth Chart --    No data found.  Updated Vital Signs BP 135/87 (BP Location: Left Arm)   Pulse 85   Temp 98.2 F (36.8 C) (Oral)   Resp 18   SpO2 98%   Visual Acuity Right Eye Distance:   Left Eye Distance:   Bilateral Distance:    Right Eye Near:   Left Eye Near:    Bilateral Near:     Physical  Exam Constitutional:      Appearance: Normal appearance.  Eyes:     Extraocular Movements: Extraocular movements intact.  Pulmonary:     Effort: Pulmonary effort is normal.  Musculoskeletal:     Comments: Tenderness present to the lateral aspect of the left knee without ecchymosis swelling or deformity, able to complete full range of motion but pain is elicited with all movement, no effusion noted, 2+ popliteal pulse, able to bear weight  Neurological:     Mental Status: She is alert and oriented to person, place, and time. Mental status is at baseline.      UC Treatments / Results  Labs (all labs ordered are listed, but only abnormal results are displayed) Labs Reviewed - No data to display  EKG   Radiology No results found.  Procedures Procedures (including critical care time)  Medications Ordered in UC Medications  ketorolac (TORADOL) 30 MG/ML injection 30 mg (has no administration in time range)    Initial Impression / Assessment and Plan / UC Course  I have reviewed the triage vital signs and the nursing notes.  Pertinent labs & imaging results that were available during my care of the patient were reviewed by me and considered in my medical decision making (see chart for details).  Acute pain of left knee  Imaging deferred due to lack of injury, most likely irritation to a tendon or ligament, discussed, Toradol IM given and prescribed prednisone for home use, compression applied by nursing staff to be used as needed for stability and support, recommended supportive care through [facility]ICE, heat  massage stretching with activity as tolerated, given a walking referral to orthopedics if no improvement seen Final Clinical Impressions(s) / UC Diagnoses   Final diagnoses:  Acute pain of left knee     Discharge Instructions      Your pain is most likely caused by irritation to the ligaments  You have been given injection of Toradol to help reduce internal inflammation  and help with pain and ideally will start to see some relief within an hour  Starting tomorrow take prednisone every morning with food as directed to continue the above process  You have been given knee compression to wear when completing activity for stability and support  You may use heating pad in 15 minute intervals as needed for additional comfort, or  you may find comfort in using ice in 10-15 minutes over affected area  Begin stretching affected area daily for 10 minutes as tolerated to further loosen muscles   When lying down place pillow underneath and between knees for support  If pain persist after recommended treatment or reoccurs if may be beneficial to follow up with orthopedic specialist for evaluation, this doctor specializes in the bones and can manage your symptoms long-term with options such as but not limited to imaging, medications or physical therapy      ED Prescriptions     Medication Sig Dispense Auth. Provider   predniSONE (STERAPRED UNI-PAK 21 TAB) 10 MG (21) TBPK tablet Take by mouth daily. Take 6 tabs by mouth daily  for 1 days, then 5 tabs for 1 days, then 4 tabs for 1 days, then 3 tabs for 1 days, 2 tabs for 1 days, then 1 tab by mouth daily for 1 days 21 tablet Shantrice Rodenberg, Elita Boone, NP      PDMP not reviewed this encounter.   Valinda Hoar, NP 10/10/23 1956

## 2023-11-06 ENCOUNTER — Encounter: Payer: Self-pay | Admitting: Psychiatry

## 2023-11-06 ENCOUNTER — Ambulatory Visit (INDEPENDENT_AMBULATORY_CARE_PROVIDER_SITE_OTHER): Payer: BC Managed Care – PPO | Admitting: Psychiatry

## 2023-11-06 DIAGNOSIS — F5105 Insomnia due to other mental disorder: Secondary | ICD-10-CM

## 2023-11-06 DIAGNOSIS — F325 Major depressive disorder, single episode, in full remission: Secondary | ICD-10-CM

## 2023-11-06 DIAGNOSIS — F4001 Agoraphobia with panic disorder: Secondary | ICD-10-CM

## 2023-11-06 MED ORDER — ALPRAZOLAM 1 MG PO TABS
1.0000 mg | ORAL_TABLET | Freq: Two times a day (BID) | ORAL | 0 refills | Status: DC | PRN
Start: 2023-11-06 — End: 2023-12-12

## 2023-11-06 MED ORDER — TRAZODONE HCL 150 MG PO TABS: 150.0000 mg | ORAL_TABLET | Freq: Every day | ORAL | 1 refills | Status: DC

## 2023-11-06 NOTE — Progress Notes (Signed)
 Carolyn Castro [id number] 02-18-1981 43 y.o.    Subjective:   Patient ID:  Carolyn Castro is a 43 y.o. (DOB December 23, 1980) female.  Chief Complaint:  Chief Complaint  Patient presents with   Follow-up   Anxiety   Stress    work   Fatigue    HPI Carolyn Castro presents to the office today for follow-up of panic disorder and depression.    When seen September 26, 2017 she had previously been switched from sertraline to paroxetine  40 mg for panic attacks.  She had a good response until a recent bout of health problems had noticed a resurgence of panic.  We decided to increase paroxetine  to greater than the usual 60 mg daily from 40 mg daily on August 23, 2018.    When seen May 2020.Health gotten better and anxiety has come down.  Increase in paroxetine  has helped her also.  No med changes were made.  She called back in June 2020 saying that her anxiety and insomnia was worse and she was having to take steroids for asthma.  She was allowed to increase Xanax  to 0.5 mg #5 daily.  Encouraged to reduce the dose as soon as possible.  It was discovered that she was also getting Xanax  from another provider and she was cautioned about that as being inappropriate. Subsequently she is gotten Xanax  from another provider on more than one occasion. She called April 09, 2019 in the catering her anxiety was worse.  She was worked in to an appointment today.  seen April 12, 2019.  She accidentally reduced the dosage a couple of months prior and  and now the anxiety is out of hand again. Therefore  Increase the paroxetine  to greater than usual dose at 60 to try to limit the need of Bz.  Increase in paroxetine  was previously effective at the higher dose.Carolyn Castro PDMP shows she is getting Xanax  1 mg quantity 30 from PCP, Dr. Sylvester Evert and oxycodone  5 mg tablets and quantities of 40-60 from [facility].   She last filled a Xanax  prescription from our office in March 2020.  Says she's stopped pain  meds.   Last fill date 8/27. 3 ER visits since she was last here for shortness of breath  seen December 2020 & March 2021.  No meds were changed. Increased paroxetine  to 60 and it's helped.     02/03/20 appt with the following noted: Consistent with paroxetine  60 since here. Xanax  0.5 mg about twice weekly. Anxiety higher lately.  2 PT jobs including dog sitting.  One of dogs got away and attacked another dog with vet bill.  She had to fork out $1000 to help pay for it.  Realizes she shouldn't have done it.  Got real frutstrated with herself for giving in to their demands. Has done therapy online about twice weekly who is good. Gets numb from stress.  GM died naturally hard.  $ stress.  Carolyn Castro can be demeaning and demanding.  Wants to do international missions but can't DT Covid. Last 2 mos hard bc no recent vacation Dt $ stress as noted with medical bills.  Drained. Sleep well with trazodone . Panic better with paroxetine  but not completely stopped but shorter and less severe. Overall really good with panic and using Xanax  about 1-2 times weeklly.    Overall anxiety still better with Paxil .  No problems with it.  Likes living at home to get support from parents over her health issues.   Better  handling stress and less panic.  Mostly back to normal.  Likes Paxil  better than Zoloft.  Not as sig depressive spells either.  Conc and appetite and function is fair.   Early November had pneumonia and otherwise function is good.   Sleeps better with  trazodone .  8-9 hours. Plan continue paroxetine  60 mg daily which was helpful.  No med changes  07/22/2020 appointment with the following noted: Change jobs TA with schools.  At times worries about job performance.  Overall things going well.  Good coworkers.  Since August.  Some worry over saving $ for the summer.  Can get panicky if overthinks that.  Xanax  helps.  Anxiety can get triggered at work too excessively.  Some hypervigilance and startle. Hx severe  asthma Plan: continue paroxetine  60, Xanax  1 mg ana d trazodone  100  01/12/2021 appointment with the following noted: Parents got Covid a few weeks ago and recovered except some fatigue. She has not had it. Anxiety comes and goes. Started counseling [facility] to deal with stressors and and anxiety and it's helped. Less need for Xanax .. Trazodone  works without hangover.   In a month half of the days are anxious. Can see people that are stressful and trigger anxiety.  Not much avoidance with counseling help. Fell asleep driving recently and cog problems and pending sleep study in the lab toninght.  Not using CPAP but using o2. No med changes    04/19/21 appt noted: A lot going on.  Labor day Covid with sig sx.  Having to use more Xanax ; about 1 daily 3 days per week.  It messed with her mind bc it drug on and started getting paranoid about her health. GF MI at church. Had a sent. Dad felll a few days later with arm fx and pending surgery.  Hyped up about him.  Worries over dad's health and super close to him. Able to sleep ok.  Work is fine with long hours. No SE.  Plan no med changes.  08/23/2021 appointment with the following noted: She continues paroxetine  60 mg daily, Xanax  1 mg twice daily, and trazodone  50 to 100 mg nightly Taking Xanax  daily. Recent pneumonia with hospitalization and then Covid triggered anxiety over breathing so still taking Xanax  to daily with this but easing up this last week.  Severe asthma Recent second Covid less severe than past. Recurring anxiety and panic problems but getting better. OK with depression.  Continues counseling has been helpful. 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 No Se and not sedated specifically. Still working and doing fine there.  02/07/22 appt noted: Good with p[axil. 1-2 panic per month and takes X anax and it helps.   Depression periods can come and go including 3-4 weeks in April without reason. Xanax  avg 1 mg  weekly. Some insomnia incl EMA with racing thoughts but total of 7-8 hours. Still going to therapy and it helped. No SE Plan: No med changes  07/18/22 appt noted: Psych meds: paroxetine  60, Xanax  1 mg twice daily, and trazodone  50 to 100 mg nightly Lost 52# to 278#.  Physcially feels better.  Since August with Wegovy . Otherwise pleased with panic.  Only taking Xanax  once every 2 weeks. Its reassuring to have it. Sleep good with trazodone .   No SE Using CPAP helps and wt loss helped with breathing. Plan no changes  03/09/23 appt noted:  Good overall.   Constant HA ? Sinus for last 2-3 mos.  Year round allergies.  Using Flonase .  Wt loss doctor.  Lost 74#.  Stopped Wegovy  Dt insurance but benefit topiramate and metformin.  Better self esteem and mentally bc not hungry all the time.  Also helped breathing.   Got job [employer] Saturdays.  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 Real pleased with meds.  Mentally I've been fine.  Able to handle things better. Only needed Xanax  1 mg 3-4 per month. Sleep ok with trazodone .   No SE with meds.   No panic.  No sig avoidance.   3rd time with Covid felt jittery and anxious for a month. "Very paranoid" resolved.  Same thing happened first time with Covid.   Only child and close to parents.    11/06/23 appt noted: Med : paroxetine  60 mg daily, trazodone  150 mg HS, Xanax  1 mg BID prn anxiety only 1 in 2 mos. Taking phentermine.  Last oxycodone  Jan.   Work stress.  Coworkers are a px with clique.  Some staff turnover.  Treated poorly by new teacher.  Worse stress than normal.  When not at work, usually ok with anxiety. Had therapy.  Spoke with school counselor.   Physically drained and feeling tired.  More tired lately.  Asked for energy pill. Sleep is usually ok.   Goes to see wt loss doctor tomorrow.    Had low back surgery end of June 2020 helped.     Past psych meds:  Zoloft 200 NR & SE,  paxil  60 good  resp.  Xanax  1 mg BID ., hydroxyzine, trazodone  100 helped. Hx severe asthma so no propranolol  Review of Systems:  Review of Systems  Constitutional:  Negative for activity change.  HENT:  Positive for voice change.   Respiratory:  Positive for shortness of breath and wheezing. Negative for chest tightness.   Musculoskeletal:  Positive for arthralgias and back pain.  Neurological:  Negative for dizziness and tremors.  Psychiatric/Behavioral:  Negative for agitation, behavioral problems, confusion, decreased concentration, hallucinations, self-injury, sleep disturbance and suicidal ideas. The patient is nervous/anxious. The patient is not hyperactive.   asthma about the same.  Medications: I have reviewed the patient's current medications.  Current Outpatient Medications  Medication Sig Dispense Refill   acetaminophen  (TYLENOL ) 500 MG tablet Take 2 tablets (1,000 mg total) by mouth every 6 (six) hours as needed for moderate pain. 30 tablet 0   albuterol  (VENTOLIN  HFA) 108 (90 Base) MCG/ACT inhaler Inhale 2 puffs into the lungs every 6 (six) hours as needed for wheezing or shortness of breath.     amLODipine  (NORVASC ) 5 MG tablet Take 5 mg by mouth daily.     CRANBERRY PO Take 1 capsule by mouth in the morning and at bedtime.     diphenhydrAMINE  (BENADRYL ) 25 mg capsule Take 25 mg by mouth every 6 (six) hours as needed for allergies.     fexofenadine (ALLEGRA) 180 MG tablet Take 180 mg by mouth daily.     ibuprofen  (ADVIL ) 200 MG tablet Take 800 mg by mouth every 6 (six) hours as needed for moderate pain (pain score 4-6). 4 tablets as needed     levothyroxine  (SYNTHROID ) 75 MCG tablet Take 75 mcg by mouth daily before breakfast.     metFORMIN (GLUCOPHAGE-XR) 500 MG 24 hr tablet Take 500 mg by mouth daily.     montelukast  (SINGULAIR ) 10 MG tablet Take 1 tablet (10 mg total) by mouth every evening. 90 tablet 0  Multiple Vitamins-Minerals (MULTIVITAMIN WOMEN) TABS Take 1 tablet by mouth  daily.     norethindrone -ethinyl estradiol  (LOESTRIN  1/20, 21,) 1-20 MG-MCG tablet Take 1 tablet by mouth daily.     omeprazole  (PRILOSEC ) 40 MG capsule Take 40 mg by mouth 2 (two) times daily.     ondansetron  (ZOFRAN -ODT) 4 MG disintegrating tablet 4mg  ODT q4 hours prn nausea/vomit 10 tablet 0   oxyCODONE  10 MG TABS Take 1 tablet (10 mg total) by mouth every 6 (six) hours as needed for severe pain (pain score 7-10). 20 tablet 0   PARoxetine  (PAXIL ) 30 MG tablet TAKE 2 TABLETS(60 MG) BY MOUTH AT BEDTIME 180 tablet 1   phentermine (ADIPEX-P) 37.5 MG tablet Take 37.5 mg by mouth daily before breakfast.     predniSONE  (STERAPRED UNI-PAK 21 TAB) 10 MG (21) TBPK tablet Take by mouth daily. Take 6 tabs by mouth daily  for 1 days, then 5 tabs for 1 days, then 4 tabs for 1 days, then 3 tabs for 1 days, 2 tabs for 1 days, then 1 tab by mouth daily for 1 days 21 tablet 0   topiramate (TOPAMAX) 50 MG tablet Take 50 mg by mouth daily.     ALPRAZolam  (XANAX ) 1 MG tablet Take 1 tablet (1 mg total) by mouth 2 (two) times daily as needed for anxiety. 30 tablet 0   traZODone  (DESYREL ) 150 MG tablet Take 1 tablet (150 mg total) by mouth at bedtime. 90 tablet 1   No current facility-administered medications for this visit.    Medication Side Effects: None  Allergies:  Allergies  Allergen Reactions   Ciprofloxacin Hcl Diarrhea and Other (See Comments)    Developed C-Diff   Fluticasone  Propionate     Headaches   Triamcinolone  Acetonide      rash on face    Past Medical History:  Diagnosis Date   Angio-edema    ankles   Anxiety    Asthma    Back pain    Constipation    Fatigue    GERD (gastroesophageal reflux disease)    Hypertension    Hypothyroid    Hypothyroidism    Sleep apnea    CPAP    Family History  Problem Relation Age of Onset   Hypertension Mother    Hypertension Father    Adrenal disorder Father        uncertain type    Social History   Socioeconomic History   Marital  status: Single    Spouse name: Not on file   Number of children: 0   Years of education: 16   Highest education level: Not on file  Occupational History   Not on file  Tobacco Use   Smoking status: Never    Passive exposure: Never   Smokeless tobacco: Never  Vaping Use   Vaping status: Never Used  Substance and Sexual Activity   Alcohol use: Never   Drug use: Never   Sexual activity: Not Currently    Birth control/protection: Pill  Other Topics Concern   Not on file  Social History Narrative   Left handed   One story home   Drinks caffeine   Social Drivers of Health   Financial Resource Strain: Not on file  Food Insecurity: Not on file  Transportation Needs: Not on file  Physical Activity: Not on file  Stress: Not on file  Social Connections: Unknown (03/10/2023)   [facility]   Social Network    Social Network: Not  on file  Intimate Partner Violence: Not At Risk (03/10/2023)   Received from [facility]   HITS    Over the last 12 months how often did your partner physically hurt you?: Never    Over the last 12 months how often did your partner insult you or talk down to you?: Never    Over the last 12 months how often did your partner threaten you with physical harm?: Never    Over the last 12 months how often did your partner scream or curse at you?: Never    Past Medical History, Surgical history, Social history, and Family history were reviewed and updated as appropriate.   Please see review of systems for further details on the patient's review from today.   Objective:   Physical Exam:  There were no vitals taken for this visit.  Physical Exam Constitutional:      General: She is not in acute distress.    Appearance: She is obese.  Neurological:     Mental Status: She is alert and oriented to person, place, and time.  Psychiatric:        Attention and Perception: She is attentive. She does not perceive auditory hallucinations.         Mood and Affect: Mood is anxious. Mood is not depressed. Affect is not labile, blunt or angry.        Speech: Speech normal. Speech is not slurred.        Behavior: Behavior normal. Behavior is not withdrawn.        Thought Content: Thought content normal. Thought content is not delusional. Thought content does not include homicidal or suicidal ideation. Thought content does not include suicidal plan.        Cognition and Memory: Cognition normal.        Judgment: Judgment normal.     Comments: Insight intact. Not depressed No auditory or visual hallucinations.  Anxiety chronic with stress at work and church.  Talkative.     Lab Review:     Component Value Date/Time   NA 136 08/05/2023 1415   K 3.8 08/05/2023 1415   CL 105 08/05/2023 1415   CO2 21 (L) 08/05/2023 1415   GLUCOSE 87 08/05/2023 1415   BUN 13 08/05/2023 1415   CREATININE 1.08 (H) 08/05/2023 1415   CALCIUM 9.5 08/05/2023 1415   PROT 7.8 08/05/2023 1415   ALBUMIN 3.9 08/05/2023 1415   AST 36 08/05/2023 1415   ALT 40 08/05/2023 1415   ALKPHOS 64 08/05/2023 1415   BILITOT 0.5 08/05/2023 1415   GFRNONAA >60 08/05/2023 1415   GFRAA >60 12/26/2019 1440       Component Value Date/Time   WBC 6.8 08/05/2023 1415   RBC 4.76 08/05/2023 1415   HGB 14.8 08/05/2023 1415   HCT 44.3 08/05/2023 1415   PLT 421 (H) 08/05/2023 1415   MCV 93.1 08/05/2023 1415   MCH 31.1 08/05/2023 1415   MCHC 33.4 08/05/2023 1415   RDW 12.1 08/05/2023 1415   LYMPHSABS 2.1 07/26/2023 1119   MONOABS 0.4 07/26/2023 1119   EOSABS 0.1 07/26/2023 1119   BASOSABS 0.0 07/26/2023 1119    No results found for: "POCLITH", "LITHIUM"   No results found for: "PHENYTOIN", "PHENOBARB", "VALPROATE", "CBMZ"   .res Assessment: Plan:    Panic disorder with agoraphobia - Plan: ALPRAZolam  (XANAX ) 1 MG tablet  Major depressive disorder with single episode, in full remission (HCC)  Insomnia due to mental condition - Plan: traZODone  (DESYREL ) 150  MG  tablet   30 min face to face time with patient was spent on counseling and coordination of care. We discussed History of severe panic disorder and mild to moderate major depression which did not respond to sertraline 200 mg a day.  Continue paroxetine  to greater than usual dose at 60 to try to limit the need of Bz.  It has helped to partially manage the anxiety and mood.  Increase in paroxetine  was effective at further reducing anxiety . Residual anxiety manageable. She remains easily stressed.  More work stress.    Complaining of fatigue but can't take other stimulants with phentermine. Consider option Wellbutrin.  But she'll discuss with PCP tomorrow.    Using CPAP without O2 at night.  Better with wt loss.  We discussed the short-term risks associated with benzodiazepines including sedation and increased fall risk among others.  Discussed long-term side effect risk including dependence, potential withdrawal symptoms, and the potential eventual dose-related risk of dementia.  But recent studies from 2020 dispute this association between benzodiazepines and dementia risk. Newer studies in 2020 do not support an association with dementia.   Disc risk with opiates.  She is not planning on consistent opiates.   She's using sparingly. Continue Xanax  1 mg BID prn  For sleep continue trazodone   150 mg HS. It helps stop worry at night. Disc sleep hygiene and restriction.  20 min counseling:  resuming therapy.  That has been helpful.  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   Counseling might help with her dealing with work stress.    Option reduce paroxetine  45 mg daily in summer to see if energy is better; continue Xanax  1 mg Bid prn, trazodone  150 HS prn Disc risk increase anxiety  Follow-up 3-4 mos.  She agrees with the plan.   Nori Beat MD, DFAPA  Please see After Visit Summary for patient specific instructions.  Future Appointments  Date Time Provider Department Center   12/18/2023  3:00 PM [facility] 1 [facility]  12/18/2023  3:10 PM [facility] US  1 [facility]  04/01/2024  4:00 PM Antonio Baumgarten, NP [facility] None     No orders of the defined types were placed in this encounter.      -------------------------------

## 2023-11-17 ENCOUNTER — Other Ambulatory Visit (HOSPITAL_COMMUNITY): Payer: Self-pay | Admitting: Student

## 2023-11-17 DIAGNOSIS — R110 Nausea: Secondary | ICD-10-CM

## 2023-11-28 ENCOUNTER — Ambulatory Visit (HOSPITAL_COMMUNITY)
Admission: RE | Admit: 2023-11-28 | Discharge: 2023-11-28 | Disposition: A | Discharge status: Home / Self Care | Payer: Self-pay | Source: Ambulatory Visit | Attending: Student | Admitting: Student

## 2023-11-28 DIAGNOSIS — R110 Nausea: Secondary | ICD-10-CM | POA: Diagnosis present

## 2023-11-28 MED ORDER — TECHNETIUM TC 99M SULFUR COLLOID FILTERED
2.0000 | Freq: Once | INTRAVENOUS | Status: AC | PRN
  Administered 2023-11-28: 2 via INTRADERMAL

## 2023-12-12 ENCOUNTER — Other Ambulatory Visit: Payer: Self-pay | Admitting: Psychiatry

## 2023-12-12 DIAGNOSIS — F4001 Agoraphobia with panic disorder: Secondary | ICD-10-CM

## 2023-12-18 ENCOUNTER — Ambulatory Visit
Admission: RE | Admit: 2023-12-18 | Discharge: 2023-12-18 | Disposition: A | Discharge status: Home / Self Care | Payer: BC Managed Care – PPO | Source: Ambulatory Visit | Attending: Obstetrics and Gynecology | Admitting: Obstetrics and Gynecology

## 2023-12-18 ENCOUNTER — Other Ambulatory Visit: Payer: Self-pay | Admitting: Obstetrics and Gynecology

## 2023-12-18 DIAGNOSIS — N6489 Other specified disorders of breast: Secondary | ICD-10-CM

## 2023-12-18 DIAGNOSIS — N6310 Unspecified lump in the right breast, unspecified quadrant: Secondary | ICD-10-CM

## 2023-12-18 DIAGNOSIS — R928 Other abnormal and inconclusive findings on diagnostic imaging of breast: Secondary | ICD-10-CM

## 2024-03-06 ENCOUNTER — Telehealth: Payer: Self-pay | Admitting: Psychiatry

## 2024-03-06 NOTE — Telephone Encounter (Signed)
 LVM to [facility]

## 2024-03-06 NOTE — Telephone Encounter (Signed)
 So she did reduce paroxetine  after last visit to 45 mg daily?   If so increase back to 60 mg daily.

## 2024-03-06 NOTE — Telephone Encounter (Signed)
 Pt called and said that she is having panic attacks 3 x a day every day. Shis experiencing severe physical effects. One is her whole body is overheating and she can't regulate her body. She is unable to sleep even though she is on sleeping pills. She said she is taking her xanax  2 x d. She would like to have her meds adjusted or increased. She wanted to see dr. Geoffry sooner than her appt 10/02. Please call Jalexa at her job at [phone]. She said just ask for Nina Postma when you call that number

## 2024-03-06 NOTE — Telephone Encounter (Signed)
 Pt reporting frequent PA, feels like her body is overheated, kind of like fight or flight, and difficulty sleeping through the night. She doesn't think sx could be hormone related. She was last seen 5/5, FU 10/2.  When questioned there are no new stressors, just life in general and school has started back. From 11/06/23: Continue paroxetine  to greater than usual dose at 60 to try to limit the need of Bz. It had also been mentioned to low dose to 45 mg during the summer to help with energy.   Taking Xanax  1 mg BID prn Trazodone  150 mg at bedtime Paroxetine  45 mg

## 2024-03-07 NOTE — Telephone Encounter (Signed)
 Pt is difficult to reach during business hours. Left her a VM that I would send response via MyChart.

## 2024-03-11 ENCOUNTER — Ambulatory Visit: Payer: Self-pay | Admitting: Psychiatry

## 2024-03-20 ENCOUNTER — Other Ambulatory Visit: Payer: Self-pay | Admitting: Psychiatry

## 2024-03-20 DIAGNOSIS — F5105 Insomnia due to other mental disorder: Secondary | ICD-10-CM

## 2024-04-01 ENCOUNTER — Encounter: Payer: Self-pay | Admitting: Primary Care

## 2024-04-01 ENCOUNTER — Telehealth: Payer: Self-pay

## 2024-04-01 ENCOUNTER — Ambulatory Visit: Admitting: Primary Care

## 2024-04-01 ENCOUNTER — Ambulatory Visit

## 2024-04-01 VITALS — BP 126/72 | HR 74 | Temp 97.3°F | Ht 64.0 in | Wt 263.2 lb

## 2024-04-01 DIAGNOSIS — R0602 Shortness of breath: Secondary | ICD-10-CM

## 2024-04-01 DIAGNOSIS — Z23 Encounter for immunization: Secondary | ICD-10-CM

## 2024-04-01 DIAGNOSIS — G4733 Obstructive sleep apnea (adult) (pediatric): Secondary | ICD-10-CM | POA: Diagnosis not present

## 2024-04-01 DIAGNOSIS — J4540 Moderate persistent asthma, uncomplicated: Secondary | ICD-10-CM

## 2024-04-01 MED ORDER — FEXOFENADINE HCL 180 MG PO TABS: 180.0000 mg | ORAL_TABLET | Freq: Every day | ORAL | 11 refills | Status: AC

## 2024-04-01 MED ORDER — ALBUTEROL SULFATE HFA 108 (90 BASE) MCG/ACT IN AERS: 2.0000 | INHALATION_SPRAY | Freq: Four times a day (QID) | RESPIRATORY_TRACT | 2 refills | Status: AC | PRN

## 2024-04-01 MED ORDER — BREZTRI AEROSPHERE 160-9-4.8 MCG/ACT IN AERO: 2.0000 | INHALATION_SPRAY | Freq: Two times a day (BID) | RESPIRATORY_TRACT | 11 refills | Status: AC

## 2024-04-01 MED ORDER — MONTELUKAST SODIUM 10 MG PO TABS: 10.0000 mg | ORAL_TABLET | Freq: Every evening | ORAL | 3 refills | Status: AC

## 2024-04-01 NOTE — Telephone Encounter (Signed)
 I could not find pt in [facility]. Pt came in today with no SD card. I tried calling Brad with [employer] but I was unable to reach him. I spoke to Franklin. She will send a message to [facility] to possibly find a compliance report.

## 2024-04-01 NOTE — Progress Notes (Signed)
 @Patient  ID: Carolyn Castro, female    DOB: 09-02-1980, 43 y.o.   MRN: [id number]  No chief complaint on file.   Referring provider: Kip Righter, MD  HPI: 43 year old female, never smoked.  Past medical history significant for obstructive sleep apnea, asthma, allergic rhinitis. Patient of Dr. Shelah.  Previous [facility] pulmonary encounter: ROV 09/20/2023 --follow-up visit 43 year old woman.  She has a history of obesity, moderate persistent asthma, chronic upper irritation syndrome and cough in the setting of GERD and allergic rhinitis.  Also mild obstructive sleep apnea for which she is on CPAP.  She had a CPAP download that was confusing to interpret, seem to show that she wears the mask but full benefit, question whether there are apneas or leak.  She does not perceive this. Feels that she benefits from the mask, wears it 3-4 nights a week. Feels rested. She has been working on wt loss. Since I last saw her she has lost 80 lbs.  Unfortunate she had COVID-19 in July 2024, did take antivirals and was treated for associated bronchitis.  Her maintenance therapy is Breztri , Allegra, Singulair  Nasonex  Astelin  nasal spray, nasal saline rinses as needed.  Also omeprazole  20 mg twice daily. She has daily nasal and sinus congestion. Sometimes pressure headaches. Can be associated w some cough. Uses albuterol  2x a month.   Obstructive sleep apnea She reports fairly good compliance, wears it about 4 nights out of the week.  On the nights that she wears that she gets good clinical benefit.  Interestingly her compliance download has incomplete data, shows that she does wear the device but does not show whether she has leak, resolution of apneas.  Will need to work on this with her DME company.  She has all of her equipment.  Plan to continue and push compliance   Asthma Fairly good control.  She did have a flare in July when she had COVID.  Plan to continue Breztri  for now.  We may be able to step this  down.  She has improved overall with weight loss and believe that her asthma would be less intrusive as she continues to lose weight.   Allergic rhinitis Remains somewhat active but fairly good control on nasal steroid, Singulair , Allegra and nasal saline rinses.  Plan to continue same regimen.    04/01/2024- Interim hx  Discussed the use of AI scribe software for clinical note transcription with the patient, who gave verbal consent to proceed.  History of Present Illness Carolyn Castro is a 43 year old female with asthma, sleep apnea, and allergic rhinitis who presents with asthma exacerbation and sinus congestion.  She uses her Breztri  inhaler daily in the morning and evening. Over the past three weeks, there has been increased lung tightness, difficulty taking deep breaths, and easy fatigue. She uses her rescue inhaler, albuterol , approximately two to four times per week and experiences shortness of breath once a day. Asthma symptoms have impacted her daily activities at home and work about half the time in the past four weeks. She rates her asthma control as poorly controlled.  A severe sinus infection occurred about a month ago, managed with prednisone , antibiotics, and sinus rinses. The infection primarily caused sinus congestion and slightly flared her asthma, but did not significantly affect her lungs.  For allergic rhinitis, she is taking Zyrtec, Singulair , and Nasacort . No asthma symptoms have woken her at night in the past four weeks.  Regarding sleep apnea, a sleep study in 2022 showed  mild sleep apnea with an AHI of 8.4. She uses her CPAP machine only one to two nights a week due to frequent dog sitting, although she notices a benefit when used. Oxygen  levels have dropped to 83% during sleep, and her average heart rate was 100 during the sleep study.  She has been on phentermine for weight management for about a year or two, with mixed results. Previously, she was on Wegovy  and  lost approximately 80 pounds, which she has maintained since switching to phentermine. Her weight has remained stable since then with no further weight loss.    Allergies  Allergen Reactions   Ciprofloxacin Hcl Diarrhea and Other (See Comments)    Developed C-Diff   Fluticasone  Propionate     Headaches   Triamcinolone  Acetonide      rash on face    Immunization History  Administered Date(s) Administered   DTaP 07/20/1982, 09/22/1982, 11/17/1982, 11/27/1985, 02/26/1988   Influenza,inj,Quad PF,6+ Mos 02/20/2019, 05/04/2021, 04/15/2022   Influenza-Unspecified 03/26/2018   Moderna SARS-COV2 Booster Vaccination 05/20/2020   Moderna Sars-Covid-2 Vaccination 09/03/2019, 10/01/2019, 05/05/2020   PFIZER(Purple Top)SARS-COV-2 Vaccination 04/30/2021   Pfizer(Comirnaty )Fall Seasonal Vaccine 12 years and older 04/15/2022, 06/29/2023   Tdap 10/01/2010, 09/03/2020    Past Medical History:  Diagnosis Date   Angio-edema    ankles   Anxiety    Asthma    Back pain    Constipation    Fatigue    GERD (gastroesophageal reflux disease)    Hypertension    Hypothyroid    Hypothyroidism    Sleep apnea    CPAP    Tobacco History: Social History   Tobacco Use  Smoking Status Never   Passive exposure: Never  Smokeless Tobacco Never   Counseling given: Not Answered   Outpatient Medications Prior to Visit  Medication Sig Dispense Refill   acetaminophen  (TYLENOL ) 500 MG tablet Take 2 tablets (1,000 mg total) by mouth every 6 (six) hours as needed for moderate pain. 30 tablet 0   albuterol  (VENTOLIN  HFA) 108 (90 Base) MCG/ACT inhaler Inhale 2 puffs into the lungs every 6 (six) hours as needed for wheezing or shortness of breath.     ALPRAZolam  (XANAX ) 1 MG tablet TAKE 1 TABLET(1 MG) BY MOUTH TWICE DAILY AS NEEDED FOR ANXIETY 30 tablet 2   amLODipine  (NORVASC ) 5 MG tablet Take 5 mg by mouth daily.     CRANBERRY PO Take 1 capsule by mouth in the morning and at bedtime.     diphenhydrAMINE   (BENADRYL ) 25 mg capsule Take 25 mg by mouth every 6 (six) hours as needed for allergies.     fexofenadine (ALLEGRA) 180 MG tablet Take 180 mg by mouth daily.     ibuprofen  (ADVIL ) 200 MG tablet Take 800 mg by mouth every 6 (six) hours as needed for moderate pain (pain score 4-6). 4 tablets as needed     levothyroxine  (SYNTHROID ) 75 MCG tablet Take 75 mcg by mouth daily before breakfast.     metFORMIN (GLUCOPHAGE-XR) 500 MG 24 hr tablet Take 500 mg by mouth daily.     montelukast  (SINGULAIR ) 10 MG tablet Take 1 tablet (10 mg total) by mouth every evening. 90 tablet 0   Multiple Vitamins-Minerals (MULTIVITAMIN WOMEN) TABS Take 1 tablet by mouth daily.     norethindrone -ethinyl estradiol  (LOESTRIN  1/20, 21,) 1-20 MG-MCG tablet Take 1 tablet by mouth daily.     omeprazole  (PRILOSEC ) 40 MG capsule Take 40 mg by mouth 2 (two) times daily.  ondansetron  (ZOFRAN -ODT) 4 MG disintegrating tablet 4mg  ODT q4 hours prn nausea/vomit 10 tablet 0   oxyCODONE  10 MG TABS Take 1 tablet (10 mg total) by mouth every 6 (six) hours as needed for severe pain (pain score 7-10). 20 tablet 0   PARoxetine  (PAXIL ) 30 MG tablet TAKE 2 TABLETS(60 MG) BY MOUTH AT BEDTIME 180 tablet 1   phentermine (ADIPEX-P) 37.5 MG tablet Take 37.5 mg by mouth daily before breakfast.     predniSONE  (STERAPRED UNI-PAK 21 TAB) 10 MG (21) TBPK tablet Take by mouth daily. Take 6 tabs by mouth daily  for 1 days, then 5 tabs for 1 days, then 4 tabs for 1 days, then 3 tabs for 1 days, 2 tabs for 1 days, then 1 tab by mouth daily for 1 days 21 tablet 0   topiramate (TOPAMAX) 50 MG tablet Take 50 mg by mouth daily.     traZODone  (DESYREL ) 150 MG tablet Take 1 tablet (150 mg total) by mouth at bedtime. 90 tablet 1   No facility-administered medications prior to visit.   Review of Systems  Review of Systems  Constitutional: Negative.   HENT: Negative.    Respiratory:  Positive for chest tightness, shortness of breath and wheezing.       Physical Exam  There were no vitals taken for this visit. Physical Exam Constitutional:      Appearance: Normal appearance.  HENT:     Head: Normocephalic and atraumatic.  Cardiovascular:     Rate and Rhythm: Normal rate and regular rhythm.  Pulmonary:     Effort: Pulmonary effort is normal.     Breath sounds: Normal breath sounds. No wheezing.     Comments: CTA Musculoskeletal:        General: Normal range of motion.  Skin:    General: Skin is warm and dry.  Neurological:     General: No focal deficit present.     Mental Status: She is alert and oriented to person, place, and time. Mental status is at baseline.  Psychiatric:        Mood and Affect: Mood normal.        Behavior: Behavior normal.        Thought Content: Thought content normal.        Judgment: Judgment normal.      CBC    Component Value Date/Time   WBC 6.8 08/05/2023 1415   RBC 4.76 08/05/2023 1415   HGB 14.8 08/05/2023 1415   HCT 44.3 08/05/2023 1415   PLT 421 (H) 08/05/2023 1415   MCV 93.1 08/05/2023 1415   MCH 31.1 08/05/2023 1415   MCHC 33.4 08/05/2023 1415   RDW 12.1 08/05/2023 1415   LYMPHSABS 2.1 07/26/2023 1119   MONOABS 0.4 07/26/2023 1119   EOSABS 0.1 07/26/2023 1119   BASOSABS 0.0 07/26/2023 1119    BMET    Component Value Date/Time   NA 136 08/05/2023 1415   K 3.8 08/05/2023 1415   CL 105 08/05/2023 1415   CO2 21 (L) 08/05/2023 1415   GLUCOSE 87 08/05/2023 1415   BUN 13 08/05/2023 1415   CREATININE 1.08 (H) 08/05/2023 1415   CALCIUM 9.5 08/05/2023 1415   GFRNONAA >60 08/05/2023 1415   GFRAA >60 12/26/2019 1440    BNP    Component Value Date/Time   BNP 11.7 05/22/2019 0512    ProBNP    Component Value Date/Time   PROBNP 24 09/10/2019 1416    Imaging: No results found.   Assessment &  Plan:   1. Obstructive sleep apnea (Primary)  2. Shortness of breath - DG Chest 2 View; Future  3. Moderate persistent asthma without complication - Flu vaccine  trivalent PF, 6mos and older(Flulaval,Afluria,Fluarix,Fluzone) - CBC with Differential; Future - IgE; Future - IgE - CBC with Differential  Assessment and Plan Assessment & Plan Asthma Asthma has been poorly controlled over the past four weeks with symptoms including chest tightness, shortness of breath once a day, and use of rescue inhaler two to four times per week. No wheezing on examination. Symptoms have not been waking her at night. The recent sinus infection may have contributed to the asthma flare.  - Order chest x-ray to evaluate shortness of breath - Refill Breztri  inhaler - Refill albuterol  inhaler - Encourage weight loss through exercise and dietary changes  Obstructive sleep apnea, mild Mild obstructive sleep apnea with an AHI of 8.4 and oxygen  desaturation to 83% during sleep study. She uses CPAP inconsistently, about one to two nights a week, but reports benefit when used. Weight loss is recommended to improve sleep apnea. The new medication Zepbound is not covered due to mild severity of sleep apnea. Risks include minimal risk for developing cardiac arrhythmia, stroke, pulmonary hypertension, and diabetes, but it could contribute to fatigue and stagnant weight loss. - Encourage consistent use of CPAP every night - Discuss with primary care about alternative GLP medications for weight management  Allergic rhinitis Allergic rhinitis is managed with Nasacort , Zyrtec, and Singulair . Recent sinus infection was treated with prednisone  and antibiotics, which resolved the symptoms. - Continue Nasacort  - Continue Zyrtec - Continue Singulair   Obesity Obesity is contributing to asthma and sleep apnea. She has maintained an 80-pound weight loss after stopping Wegovy  and starting phentermine, but has not lost additional weight. Phentermine has not been effective for further weight loss. Weight loss is recommended to improve asthma and sleep apnea - Discuss with primary care about  alternative GLP medications for weight management - Encourage exercise and dietary changes for weight loss  Recording duration: 16 minutes  Carolyn LELON Ferrari, NP 04/01/2024

## 2024-04-01 NOTE — Patient Instructions (Signed)
  VISIT SUMMARY: During your visit, we discussed your asthma, sleep apnea, allergic rhinitis, and weight management. You reported increased asthma symptoms and sinus congestion over the past few weeks. We reviewed your current medications and discussed the importance of consistent use of your CPAP machine for sleep apnea. We also talked about weight management strategies to help improve your overall health.  YOUR PLAN: -ASTHMA: Asthma is a condition where your airways become inflamed and narrow, making it hard to breathe. Your asthma has been poorly controlled recently, so we will order a chest x-ray to evaluate your shortness of breath. Continue using your Breztri  inhaler daily and your albuterol  inhaler as needed. Keep taking Singulair  and Allegra, and continue with saline rinses. Weight loss through exercise and dietary changes is also encouraged to help manage your asthma.  -OBSTRUCTIVE SLEEP APNEA, MILD: Obstructive sleep apnea is a condition where your breathing stops and starts during sleep. You have mild sleep apnea and use your CPAP machine inconsistently. It's important to use your CPAP every night to improve your symptoms. We also recommend discussing alternative weight management medications with your primary care doctor, as weight loss can help improve sleep apnea.  -ALLERGIC RHINITIS: Allergic rhinitis is an allergic reaction that causes sneezing, congestion, and a runny nose. Continue using Nasacort , Zyrtec, and Singulair  to manage your symptoms.  -OBESITY: Obesity is a condition where you have an excessive amount of body fat, which can contribute to other health issues like asthma and sleep apnea. You have maintained an 80-pound weight loss, but further weight loss is recommended. Discuss alternative weight management medications with your primary care doctor and continue with exercise and dietary changes.  INSTRUCTIONS: Please schedule a chest x-ray to evaluate your shortness of breath.  Use your CPAP machine every night. Discuss alternative GLP medications for weight management with your primary care doctor. Continue with your current medications and lifestyle changes to manage your conditions.  Orders: CXR and labs   Follow-up 6 months with Dr. Byrum

## 2024-04-02 ENCOUNTER — Ambulatory Visit: Payer: Self-pay | Admitting: Primary Care

## 2024-04-02 LAB — CBC WITH DIFFERENTIAL/PLATELET
Basophils Absolute: 0.1 K/uL (ref 0.0–0.1)
Basophils Relative: 0.9 % (ref 0.0–3.0)
Eosinophils Absolute: 0.1 K/uL (ref 0.0–0.7)
Eosinophils Relative: 1.1 % (ref 0.0–5.0)
HCT: 39.9 % (ref 36.0–46.0)
Hemoglobin: 13.5 g/dL (ref 12.0–15.0)
Lymphocytes Relative: 35.4 % (ref 12.0–46.0)
Lymphs Abs: 2.6 K/uL (ref 0.7–4.0)
MCHC: 33.7 g/dL (ref 30.0–36.0)
MCV: 92.2 fl (ref 78.0–100.0)
Monocytes Absolute: 0.5 K/uL (ref 0.1–1.0)
Monocytes Relative: 7.3 % (ref 3.0–12.0)
Neutro Abs: 4 K/uL (ref 1.4–7.7)
Neutrophils Relative %: 55.3 % (ref 43.0–77.0)
Platelets: 315 K/uL (ref 150.0–400.0)
RBC: 4.33 Mil/uL (ref 3.87–5.11)
RDW: 13 % (ref 11.5–15.5)
WBC: 7.2 K/uL (ref 4.0–10.5)

## 2024-04-03 LAB — IGE: IgE (Immunoglobulin E), Serum: 87 kU/L (ref <=114)

## 2024-04-04 ENCOUNTER — Encounter: Payer: Self-pay | Admitting: Psychiatry

## 2024-04-04 ENCOUNTER — Other Ambulatory Visit: Payer: Self-pay | Admitting: Psychiatry

## 2024-04-04 ENCOUNTER — Ambulatory Visit: Payer: Self-pay | Admitting: Psychiatry

## 2024-04-04 DIAGNOSIS — F4001 Agoraphobia with panic disorder: Secondary | ICD-10-CM

## 2024-04-04 DIAGNOSIS — F325 Major depressive disorder, single episode, in full remission: Secondary | ICD-10-CM

## 2024-04-04 DIAGNOSIS — F5105 Insomnia due to other mental disorder: Secondary | ICD-10-CM | POA: Diagnosis not present

## 2024-04-04 MED ORDER — ALPRAZOLAM 1 MG PO TABS: 1.0000 mg | ORAL_TABLET | Freq: Two times a day (BID) | ORAL | 2 refills | Status: DC | PRN

## 2024-04-04 MED ORDER — PAROXETINE HCL 30 MG PO TABS: 60.0000 mg | ORAL_TABLET | Freq: Every day | ORAL | 1 refills | Status: DC

## 2024-04-04 MED ORDER — TRAZODONE HCL 150 MG PO TABS: 150.0000 mg | ORAL_TABLET | Freq: Every day | ORAL | 1 refills | Status: DC

## 2024-04-04 MED ORDER — PROPRANOLOL HCL 20 MG PO TABS: 20.0000 mg | ORAL_TABLET | Freq: Two times a day (BID) | ORAL | 1 refills | Status: DC | PRN

## 2024-04-04 NOTE — Progress Notes (Signed)
 MAURINE MOWBRAY [id number] January 07, 1981 43 y.o.    Subjective:   Patient ID:  ORIAN AMBERG is a 43 y.o. (DOB July 28, 1980) female.  Chief Complaint:  Chief Complaint  Patient presents with   Follow-up   Anxiety    HPI DIORA BELLIZZI presents to the office today for follow-up of panic disorder and depression.    When seen September 26, 2017 she had previously been switched from sertraline to paroxetine  40 mg for panic attacks.  She had a good response until a recent bout of health problems had noticed a resurgence of panic.  We decided to increase paroxetine  to greater than the usual 60 mg daily from 40 mg daily on August 23, 2018.    When seen May 2020.Health gotten better and anxiety has come down.  Increase in paroxetine  has helped her also.  No med changes were made.  She called back in June 2020 saying that her anxiety and insomnia was worse and she was having to take steroids for asthma.  She was allowed to increase Xanax  to 0.5 mg #5 daily.  Encouraged to reduce the dose as soon as possible.  It was discovered that she was also getting Xanax  from another provider and she was cautioned about that as being inappropriate. Subsequently she is gotten Xanax  from another provider on more than one occasion. She called April 09, 2019 in the catering her anxiety was worse.  She was worked in to an appointment today.  seen April 12, 2019.  She accidentally reduced the dosage a couple of months prior and  and now the anxiety is out of hand again. Therefore  Increase the paroxetine  to greater than usual dose at 60 to try to limit the need of Bz.  Increase in paroxetine  was previously effective at the higher dose.[place] PDMP shows she is getting Xanax  1 mg quantity 30 from PCP, Dr. Niels Roys and oxycodone  5 mg tablets and quantities of 40-60 from [facility].   She last filled a Xanax  prescription from our office in March 2020.  Says she's stopped pain meds.   Last fill date 8/27. 3 ER  visits since she was last here for shortness of breath  seen December 2020 & March 2021.  No meds were changed. Increased paroxetine  to 60 and it's helped.     02/03/20 appt with the following noted: Consistent with paroxetine  60 since here. Xanax  0.5 mg about twice weekly. Anxiety higher lately.  2 PT jobs including dog sitting.  One of dogs got away and attacked another dog with vet bill.  She had to fork out $1000 to help pay for it.  Realizes she shouldn't have done it.  Got real frutstrated with herself for giving in to their demands. Has done therapy online about twice weekly who is good. Gets numb from stress.  GM died naturally hard.  $ stress.  Cecily can be demeaning and demanding.  Wants to do international missions but can't DT Covid. Last 2 mos hard bc no recent vacation Dt $ stress as noted with medical bills.  Drained. Sleep well with trazodone . Panic better with paroxetine  but not completely stopped but shorter and less severe. Overall really good with panic and using Xanax  about 1-2 times weeklly.    Overall anxiety still better with Paxil .  No problems with it.  Likes living at home to get support from parents over her health issues.   Better handling stress and less panic.  Mostly back to normal.  Likes Paxil  better than Zoloft.  Not as sig depressive spells either.  Conc and appetite and function is fair.   Early November had pneumonia and otherwise function is good.   Sleeps better with  trazodone .  8-9 hours. Plan continue paroxetine  60 mg daily which was helpful.  No med changes  07/22/2020 appointment with the following noted: Change jobs TA with schools.  At times worries about job performance.  Overall things going well.  Good coworkers.  Since August.  Some worry over saving $ for the summer.  Can get panicky if overthinks that.  Xanax  helps.  Anxiety can get triggered at work too excessively.  Some hypervigilance and startle. Hx severe asthma Plan: continue paroxetine   60, Xanax  1 mg ana d trazodone  100  01/12/2021 appointment with the following noted: Parents got Covid a few weeks ago and recovered except some fatigue. She has not had it. Anxiety comes and goes. Started counseling [facility] to deal with stressors and and anxiety and it's helped. Less need for Xanax .. Trazodone  works without hangover.   In a month half of the days are anxious. Can see people that are stressful and trigger anxiety.  Not much avoidance with counseling help. Fell asleep driving recently and cog problems and pending sleep study in the lab toninght.  Not using CPAP but using o2. No med changes    04/19/21 appt noted: A lot going on.  Labor day Covid with sig sx.  Having to use more Xanax ; about 1 daily 3 days per week.  It messed with her mind bc it drug on and started getting paranoid about her health. GF MI at church. Had a sent. Dad felll a few days later with arm fx and pending surgery.  Hyped up about him.  Worries over dad's health and super close to him. Able to sleep ok.  Work is fine with long hours. No SE.  Plan no med changes.  08/23/2021 appointment with the following noted: She continues paroxetine  60 mg daily, Xanax  1 mg twice daily, and trazodone  50 to 100 mg nightly Taking Xanax  daily. Recent pneumonia with hospitalization and then Covid triggered anxiety over breathing so still taking Xanax  to daily with this but easing up this last week.  Severe asthma Recent second Covid less severe than past. Recurring anxiety and panic problems but getting better. OK with depression.  Continues counseling has been helpful. 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 No Se and not sedated specifically. Still working and doing fine there.  02/07/22 appt noted: Good with p[axil. 1-2 panic per month and takes X anax and it helps.   Depression periods can come and go including 3-4 weeks in April without reason. Xanax  avg 1 mg weekly. Some insomnia incl EMA with  racing thoughts but total of 7-8 hours. Still going to therapy and it helped. No SE Plan: No med changes  07/18/22 appt noted: Psych meds: paroxetine  60, Xanax  1 mg twice daily, and trazodone  50 to 100 mg nightly Lost 52# to 278#.  Physcially feels better.  Since August with Wegovy . Otherwise pleased with panic.  Only taking Xanax  once every 2 weeks. Its reassuring to have it. Sleep good with trazodone .   No SE Using CPAP helps and wt loss helped with breathing. Plan no changes  03/09/23 appt noted:  Good overall.   Constant HA ? Sinus for last 2-3 mos.  Year round allergies.  Using Flonase . Wt loss doctor.  Lost 74#.  Stopped Wegovy  Dt insurance  but benefit topiramate and metformin.  Better self esteem and mentally bc not hungry all the time.  Also helped breathing.   Got job [employer] Saturdays.  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 Real pleased with meds.  Mentally I've been fine.  Able to handle things better. Only needed Xanax  1 mg 3-4 per month. Sleep ok with trazodone .   No SE with meds.   No panic.  No sig avoidance.   3rd time with Covid felt jittery and anxious for a month. Very paranoid resolved.  Same thing happened first time with Covid.   Only child and close to parents.    11/06/23 appt noted: Med : paroxetine  60 mg daily, trazodone  150 mg HS, Xanax  1 mg BID prn anxiety only 1 in 2 mos. Taking phentermine.  Last oxycodone  Jan.   Work stress.  Coworkers are a px with clique.  Some staff turnover.  Treated poorly by new teacher.  Worse stress than normal.  When not at work, usually ok with anxiety. Had therapy.  Spoke with school counselor.   Physically drained and feeling tired.  More tired lately.  Asked for energy pill. Sleep is usually ok.   Goes to see wt loss doctor tomorrow.   Plan: Option reduce paroxetine  45 mg daily in summer to see if energy is better; continue Xanax  1 mg Bid prn, trazodone  150 HS prn  03/06/24 TC:   Pt reporting frequent PA, feels like her body is overheated, kind of like fight or flight, and difficulty sleeping through the night. She doesn't think sx could be hormone related. She was last seen 5/5, FU 10/2.  When questioned there are no new stressors, just life in general and school has started back.  Taking Xanax  1 mg BID prn Trazodone  150 mg at bedtime Paroxetine  45 mg  MD resp:  increase paroxetine  back to 60 mg daily. Lorene Macintosh, MD, DFAPA      04/04/24 appt noted:  Med: paroxetine  60 mg daily, trazodone  150 mg HS, Xanax  1 mg BID prn anxiety only 1 in 2 mos. Taking phentermine helps some. Some sense of rejection she has dealt with this summer both work and church.  Seems like pastor's wife is hot and cold towards her.   Current coworker situation is better than last visit.   Attending new church and likes it since May.  But get in my head anxious about what might happen bad.  Feels like she is hyping her own anxiety up and then will feel it in her head, lungs tight, flushing.   It did helpenergy to reduce paroxetine  to 45 mg daily.  No change in panic mild episodes in the last month.  Worse in am and better later.  Has had counseling to lear relaxation skills. SE fatigue  Past psych meds:  Zoloft 200 NR & SE,  paxil  60 good resp.  Xanax  1 mg BID ., hydroxyzine, trazodone  100 helped. Hx severe asthma so no propranolol  Review of Systems:  Review of Systems  Constitutional:  Negative for activity change.  HENT:  Positive for voice change.   Respiratory:  Positive for shortness of breath and wheezing. Negative for chest tightness.   Musculoskeletal:  Positive for arthralgias and back pain.  Neurological:  Negative for dizziness and tremors.  Psychiatric/Behavioral:  Negative for agitation, behavioral problems, confusion, decreased concentration, hallucinations, self-injury, sleep disturbance and suicidal ideas. The patient is nervous/anxious. The  patient is not hyperactive.    asthma about the same.  Medications: I have reviewed the patient's current medications.  Current Outpatient Medications  Medication Sig Dispense Refill   acetaminophen  (TYLENOL ) 500 MG tablet Take 2 tablets (1,000 mg total) by mouth every 6 (six) hours as needed for moderate pain. 30 tablet 0   albuterol  (VENTOLIN  HFA) 108 (90 Base) MCG/ACT inhaler Inhale 2 puffs into the lungs every 6 (six) hours as needed for wheezing or shortness of breath. 6.7 g 2   amLODipine  (NORVASC ) 5 MG tablet Take 5 mg by mouth daily.     budesonide -glycopyrrolate-formoterol  (BREZTRI  AEROSPHERE) 160-9-4.8 MCG/ACT AERO inhaler Inhale 2 puffs into the lungs in the morning and at bedtime. 10.7 g 11   CRANBERRY PO Take 1 capsule by mouth in the morning and at bedtime.     diphenhydrAMINE  (BENADRYL ) 25 mg capsule Take 25 mg by mouth every 6 (six) hours as needed for allergies.     fexofenadine (ALLEGRA) 180 MG tablet Take 1 tablet (180 mg total) by mouth daily. 30 tablet 11   ibuprofen  (ADVIL ) 200 MG tablet Take 800 mg by mouth every 6 (six) hours as needed for moderate pain (pain score 4-6). 4 tablets as needed     levothyroxine  (SYNTHROID ) 75 MCG tablet Take 75 mcg by mouth daily before breakfast.     metFORMIN (GLUCOPHAGE-XR) 500 MG 24 hr tablet Take 500 mg by mouth daily.     montelukast  (SINGULAIR ) 10 MG tablet Take 1 tablet (10 mg total) by mouth every evening. 90 tablet 3   Multiple Vitamins-Minerals (MULTIVITAMIN WOMEN) TABS Take 1 tablet by mouth daily.     norethindrone -ethinyl estradiol  (LOESTRIN  1/20, 21,) 1-20 MG-MCG tablet Take 1 tablet by mouth daily.     omeprazole  (PRILOSEC ) 40 MG capsule Take 40 mg by mouth 2 (two) times daily.     ondansetron  (ZOFRAN -ODT) 4 MG disintegrating tablet 4mg  ODT q4 hours prn nausea/vomit 10 tablet 0   phentermine (ADIPEX-P) 37.5 MG tablet Take 37.5 mg by mouth daily before breakfast.     topiramate (TOPAMAX) 50 MG tablet Take 50 mg by mouth daily.     ALPRAZolam  (XANAX ) 1  MG tablet Take 1 tablet (1 mg total) by mouth 2 (two) times daily as needed for anxiety. 30 tablet 2   PARoxetine  (PAXIL ) 30 MG tablet Take 2 tablets (60 mg total) by mouth daily. 180 tablet 1   propranolol (INDERAL) 20 MG tablet Take 1 tablet (20 mg total) by mouth 2 (two) times daily as needed (anxiety). 60 tablet 1   traZODone  (DESYREL ) 150 MG tablet Take 1 tablet (150 mg total) by mouth at bedtime. 90 tablet 1   No current facility-administered medications for this visit.    Medication Side Effects: None  Allergies:  Allergies  Allergen Reactions   Ciprofloxacin Hcl Diarrhea and Other (See Comments)    Developed C-Diff   Fluticasone  Propionate     Headaches   Triamcinolone  Acetonide      rash on face    Past Medical History:  Diagnosis Date   Angio-edema    ankles   Anxiety    Asthma    Back pain    Constipation    Fatigue    GERD (gastroesophageal reflux disease)    Hypertension    Hypothyroid    Hypothyroidism    Sleep apnea    CPAP    Family History  Problem Relation Age of Onset   Hypertension Mother    Hypertension  Father    Adrenal disorder Father        uncertain type    Social History   Socioeconomic History   Marital status: Single    Spouse name: Not on file   Number of children: 0   Years of education: 16   Highest education level: Not on file  Occupational History   Not on file  Tobacco Use   Smoking status: Never    Passive exposure: Never   Smokeless tobacco: Never  Vaping Use   Vaping status: Never Used  Substance and Sexual Activity   Alcohol use: Never   Drug use: Never   Sexual activity: Not Currently    Birth control/protection: Pill  Other Topics Concern   Not on file  Social History Narrative   Left handed   One story home   Drinks caffeine   Social Drivers of Health   Financial Resource Strain: Not on file  Food Insecurity: Not on file  Transportation Needs: Not on file  Physical Activity: Not on file  Stress:  Not on file  Social Connections: Unknown (03/10/2023)   [facility]   Social Network    Social Network: Not on file  Intimate Partner Violence: Not At Risk (03/10/2023)   Received from [facility]   HITS    Over the last 12 months how often did your partner physically hurt you?: Never    Over the last 12 months how often did your partner insult you or talk down to you?: Never    Over the last 12 months how often did your partner threaten you with physical harm?: Never    Over the last 12 months how often did your partner scream or curse at you?: Never    Past Medical History, Surgical history, Social history, and Family history were reviewed and updated as appropriate.   Please see review of systems for further details on the patient's review from today.   Objective:   Physical Exam:  There were no vitals taken for this visit.  Physical Exam Constitutional:      General: She is not in acute distress.    Appearance: She is obese.  Neurological:     Mental Status: She is alert and oriented to person, place, and time.  Psychiatric:        Attention and Perception: She is attentive. She does not perceive auditory hallucinations.        Mood and Affect: Mood is anxious. Mood is not depressed. Affect is not labile, blunt or angry.        Speech: Speech normal. Speech is not slurred.        Behavior: Behavior normal. Behavior is not withdrawn.        Thought Content: Thought content normal. Thought content is not delusional. Thought content does not include homicidal or suicidal ideation. Thought content does not include suicidal plan.        Cognition and Memory: Cognition normal.        Judgment: Judgment normal.     Comments: Insight intact. Not depressed No auditory or visual hallucinations.  Anxiety chronic with stress at work and church.  Talkative without pressure.     Lab Review:     Component Value Date/Time   NA 136 08/05/2023 1415   K 3.8 08/05/2023  1415   CL 105 08/05/2023 1415   CO2 21 (L) 08/05/2023 1415   GLUCOSE 87 08/05/2023 1415   BUN 13 08/05/2023 1415  CREATININE 1.08 (H) 08/05/2023 1415   CALCIUM 9.5 08/05/2023 1415   PROT 7.8 08/05/2023 1415   ALBUMIN 3.9 08/05/2023 1415   AST 36 08/05/2023 1415   ALT 40 08/05/2023 1415   ALKPHOS 64 08/05/2023 1415   BILITOT 0.5 08/05/2023 1415   GFRNONAA >60 08/05/2023 1415   GFRAA >60 12/26/2019 1440       Component Value Date/Time   WBC 7.2 04/01/2024 1645   RBC 4.33 04/01/2024 1645   HGB 13.5 04/01/2024 1645   HCT 39.9 04/01/2024 1645   PLT 315.0 04/01/2024 1645   MCV 92.2 04/01/2024 1645   MCH 31.1 08/05/2023 1415   MCHC 33.7 04/01/2024 1645   RDW 13.0 04/01/2024 1645   LYMPHSABS 2.6 04/01/2024 1645   MONOABS 0.5 04/01/2024 1645   EOSABS 0.1 04/01/2024 1645   BASOSABS 0.1 04/01/2024 1645    No results found for: POCLITH, LITHIUM   No results found for: PHENYTOIN, PHENOBARB, VALPROATE, CBMZ   .res Assessment: Plan:    Panic disorder with agoraphobia - Plan: ALPRAZolam  (XANAX ) 1 MG tablet, PARoxetine  (PAXIL ) 30 MG tablet, propranolol (INDERAL) 20 MG tablet  Major depressive disorder with single episode, in full remission - Plan: PARoxetine  (PAXIL ) 30 MG tablet  Insomnia due to mental condition - Plan: traZODone  (DESYREL ) 150 MG tablet   30 min face to face time with patient .  We discussed History of severe panic disorder and mild to moderate major depression which did not respond to sertraline 200 mg a day.  Continue paroxetine  to greater than usual dose at 60 to try to limit the need of Bz.  It has helped to partially manage the anxiety and mood.  Increase in paroxetine  was effective at further reducing anxiety previously . Residual anxiety manageable. She remains easily stressed.  More work stress.    Complaining of fatigue but can't take other stimulants with phentermine. Consider option Wellbutrin.  But she'll discuss with PCP tomorrow.     Using CPAP without O2 at night.  Better with wt loss.  We discussed the short-term risks associated with benzodiazepines including sedation and increased fall risk among others.  Discussed long-term side effect risk including dependence, potential withdrawal symptoms, and the potential eventual dose-related risk of dementia.  But recent studies from 2020 dispute this association between benzodiazepines and dementia risk. Newer studies in 2020 do not support an association with dementia.   Disc risk with opiates.  She is not planning on consistent opiates.   She's using sparingly. Continue Xanax  1 mg BID prn  For sleep continue trazodone   150 mg HS. It helps stop worry at night. Disc sleep hygiene and restriction.  20 min counseling:  rCBT around acitivity helping anxiety, cog self talk using Scripture, etc. Counseling might help with her dealing with work stress.   Still continues counseling and it helps.  Has had extensive counseling .  reduced paroxetine  45 mg daily in summer to see if energy is better and it was. So far panic not better since increasing paroxetine  back to 60 mg daily.  Give it mor etime.  PCP rx propranolol 20 mg prn and it helps.  Disc risk with asthma but not having to use rescue inhaler.    continue Xanax  1 mg Bid prn, trazodone  150 HS prn Disc risk increase anxiety  Follow-up 2-3 mos.  She agrees with the plan.   Lorene Macintosh MD, DFAPA  Please see After Visit Summary for patient specific instructions.  Future Appointments  Date Time  Provider Department Center  06/21/2024  8:40 AM [facility] 1 [facility]  06/21/2024  8:50 AM [facility] US  1 [facility]     No orders of the defined types were placed in this encounter.      -------------------------------

## 2024-05-14 ENCOUNTER — Other Ambulatory Visit (HOSPITAL_COMMUNITY)
Admission: RE | Admit: 2024-05-14 | Discharge: 2024-05-14 | Disposition: A | Discharge status: Home / Self Care | Source: Ambulatory Visit | Attending: Obstetrics and Gynecology | Admitting: Obstetrics and Gynecology

## 2024-05-14 ENCOUNTER — Other Ambulatory Visit: Payer: Self-pay | Admitting: Obstetrics and Gynecology

## 2024-05-14 DIAGNOSIS — Z01419 Encounter for gynecological examination (general) (routine) without abnormal findings: Secondary | ICD-10-CM | POA: Insufficient documentation

## 2024-05-15 LAB — CYTOLOGY - PAP
Comment: NEGATIVE
Diagnosis: NEGATIVE
High risk HPV: NEGATIVE

## 2024-06-12 ENCOUNTER — Encounter: Payer: Self-pay | Admitting: Psychiatry

## 2024-06-12 ENCOUNTER — Ambulatory Visit: Admitting: Psychiatry

## 2024-06-12 DIAGNOSIS — F5105 Insomnia due to other mental disorder: Secondary | ICD-10-CM

## 2024-06-12 DIAGNOSIS — F4001 Agoraphobia with panic disorder: Secondary | ICD-10-CM

## 2024-06-12 DIAGNOSIS — F325 Major depressive disorder, single episode, in full remission: Secondary | ICD-10-CM

## 2024-06-12 MED ORDER — ARIPIPRAZOLE 2 MG PO TABS: 2.0000 mg | ORAL_TABLET | Freq: Every day | ORAL | 1 refills | Status: DC

## 2024-06-12 NOTE — Progress Notes (Signed)
 Carolyn Castro [id number] July 04, 1981 43 y.o.    Subjective:   Patient ID:  Carolyn Castro is a 43 y.o. (DOB 25-May-1981) female.  Chief Complaint:  Chief Complaint  Patient presents with   Follow-up   Anxiety    HPI [name] presents to the office today for follow-up of panic disorder and depression.    When seen September 26, 2017 she had previously been switched from sertraline to paroxetine  40 mg for panic attacks.  She had a good response until a recent bout of health problems had noticed a resurgence of panic.  We decided to increase paroxetine  to greater than the usual 60 mg daily from 40 mg daily on August 23, 2018.    When seen May 2020.Health gotten better and anxiety has come down.  Increase in paroxetine  has helped her also.  No med changes were made.  She called back in June 2020 saying that her anxiety and insomnia was worse and she was having to take steroids for asthma.  She was allowed to increase Xanax  to 0.5 mg #5 daily.  Encouraged to reduce the dose as soon as possible.  It was discovered that she was also getting Xanax  from another provider and she was cautioned about that as being inappropriate. Subsequently she is gotten Xanax  from another provider on more than one occasion. She called April 09, 2019 in the catering her anxiety was worse.  She was worked in to an appointment today.  seen April 12, 2019.  She accidentally reduced the dosage a couple of months prior and  and now the anxiety is out of hand again. Therefore  Increase the paroxetine  to greater than usual dose at 60 to try to limit the need of Bz.  Increase in paroxetine  was previously effective at the higher dose.[place] PDMP shows she is getting Xanax  1 mg quantity 30 from PCP, Dr. Niels Roys and oxycodone  5 mg tablets and quantities of 40-60 from [facility].   She last filled a Xanax  prescription from our office in March 2020.  Says she's stopped pain meds.   Last fill date 8/27. 3 ER  visits since she was last here for shortness of breath  seen December 2020 & March 2021.  No meds were changed. Increased paroxetine  to 60 and it's helped.     02/03/20 appt with the following noted: Consistent with paroxetine  60 since here. Xanax  0.5 mg about twice weekly. Anxiety higher lately.  2 PT jobs including dog sitting.  One of dogs got away and attacked another dog with vet bill.  She had to fork out $1000 to help pay for it.  Realizes she shouldn't have done it.  Got real frutstrated with herself for giving in to their demands. Has done therapy online about twice weekly who is good. Gets numb from stress.  GM died naturally hard.  $ stress.  Cecily can be demeaning and demanding.  Wants to do international missions but can't DT Covid. Last 2 mos hard bc no recent vacation Dt $ stress as noted with medical bills.  Drained. Sleep well with trazodone . Panic better with paroxetine  but not completely stopped but shorter and less severe. Overall really good with panic and using Xanax  about 1-2 times weeklly.    Overall anxiety still better with Paxil .  No problems with it.  Likes living at home to get support from parents over her health issues.   Better handling stress and less panic.  Mostly back to normal.  Likes Paxil  better than Zoloft.  Not as sig depressive spells either.  Conc and appetite and function is fair.   Early November had pneumonia and otherwise function is good.   Sleeps better with  trazodone .  8-9 hours. Plan continue paroxetine  60 mg daily which was helpful.  No med changes  07/22/2020 appointment with the following noted: Change jobs TA with schools.  At times worries about job performance.  Overall things going well.  Good coworkers.  Since August.  Some worry over saving $ for the summer.  Can get panicky if overthinks that.  Xanax  helps.  Anxiety can get triggered at work too excessively.  Some hypervigilance and startle. Hx severe asthma Plan: continue paroxetine   60, Xanax  1 mg ana d trazodone  100  01/12/2021 appointment with the following noted: Parents got Covid a few weeks ago and recovered except some fatigue. She has not had it. Anxiety comes and goes. Started counseling [facility] to deal with stressors and and anxiety and it's helped. Less need for Xanax .. Trazodone  works without hangover.   In a month half of the days are anxious. Can see people that are stressful and trigger anxiety.  Not much avoidance with counseling help. Fell asleep driving recently and cog problems and pending sleep study in the lab toninght.  Not using CPAP but using o2. No med changes    04/19/21 appt noted: A lot going on.  Labor day Covid with sig sx.  Having to use more Xanax ; about 1 daily 3 days per week.  It messed with her mind bc it drug on and started getting paranoid about her health. GF MI at church. Had a sent. Dad felll a few days later with arm fx and pending surgery.  Hyped up about him.  Worries over dad's health and super close to him. Able to sleep ok.  Work is fine with long hours. No SE.  Plan no med changes.  08/23/2021 appointment with the following noted: She continues paroxetine  60 mg daily, Xanax  1 mg twice daily, and trazodone  50 to 100 mg nightly Taking Xanax  daily. Recent pneumonia with hospitalization and then Covid triggered anxiety over breathing so still taking Xanax  to daily with this but easing up this last week.  Severe asthma Recent second Covid less severe than past. Recurring anxiety and panic problems but getting better. OK with depression.  Continues counseling has been helpful. 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 No Se and not sedated specifically. Still working and doing fine there.  02/07/22 appt noted: Good with p[axil. 1-2 panic per month and takes X anax and it helps.   Depression periods can come and go including 3-4 weeks in April without reason. Xanax  avg 1 mg weekly. Some insomnia incl EMA with  racing thoughts but total of 7-8 hours. Still going to therapy and it helped. No SE Plan: No med changes  07/18/22 appt noted: Psych meds: paroxetine  60, Xanax  1 mg twice daily, and trazodone  50 to 100 mg nightly Lost 52# to 278#.  Physcially feels better.  Since August with Wegovy . Otherwise pleased with panic.  Only taking Xanax  once every 2 weeks. Its reassuring to have it. Sleep good with trazodone .   No SE Using CPAP helps and wt loss helped with breathing. Plan no changes  03/09/23 appt noted:  Good overall.   Constant HA ? Sinus for last 2-3 mos.  Year round allergies.  Using Flonase . Wt loss doctor.  Lost 74#.  Stopped Wegovy  Dt insurance  but benefit topiramate and metformin.  Better self esteem and mentally bc not hungry all the time.  Also helped breathing.   Got job [employer] Saturdays.  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 Real pleased with meds.  Mentally I've been fine.  Able to handle things better. Only needed Xanax  1 mg 3-4 per month. Sleep ok with trazodone .   No SE with meds.   No panic.  No sig avoidance.   3rd time with Covid felt jittery and anxious for a month. Very paranoid resolved.  Same thing happened first time with Covid.   Only child and close to parents.    11/06/23 appt noted: Med : paroxetine  60 mg daily, trazodone  150 mg HS, Xanax  1 mg BID prn anxiety only 1 in 2 mos. Taking phentermine.  Last oxycodone  Jan.   Work stress.  Coworkers are a px with clique.  Some staff turnover.  Treated poorly by new teacher.  Worse stress than normal.  When not at work, usually ok with anxiety. Had therapy.  Spoke with school counselor.   Physically drained and feeling tired.  More tired lately.  Asked for energy pill. Sleep is usually ok.   Goes to see wt loss doctor tomorrow.   Plan: Option reduce paroxetine  45 mg daily in summer to see if energy is better; continue Xanax  1 mg Bid prn, trazodone  150 HS prn  03/06/24 TC:   Pt reporting frequent PA, feels like her body is overheated, kind of like fight or flight, and difficulty sleeping through the night. She doesn't think sx could be hormone related. She was last seen 5/5, FU 10/2.  When questioned there are no new stressors, just life in general and school has started back.  Taking Xanax  1 mg BID prn Trazodone  150 mg at bedtime Paroxetine  45 mg  MD resp:  increase paroxetine  back to 60 mg daily. Lorene Macintosh, MD, DFAPA      04/04/24 appt noted:  Med: paroxetine  60 mg daily, trazodone  150 mg HS, Xanax  1 mg BID prn anxiety only 1 in 2 mos. Taking phentermine helps some. Some sense of rejection she has dealt with this summer both work and church.  Seems like pastor's wife is hot and cold towards her.   Current coworker situation is better than last visit.   Attending new church and likes it since May.  But get in my head anxious about what might happen bad.  Feels like she is hyping her own anxiety up and then will feel it in her head, lungs tight, flushing.   It did helpenergy to reduce paroxetine  to 45 mg daily.  No change in panic mild episodes in the last month.  Worse in am and better later.  Has had counseling to lear relaxation skills. SE fatigue Plan: reduced paroxetine  45 mg daily in summer to see if energy is better and it was. So far panic not better since increasing paroxetine  back to 60 mg daily.  Give it mor etime.  06/12/24 appt noted: Med: paroxetine  60 mg daily, trazodone  150 mg HS, Xanax  1 mg BID prn anxiety only 1 in 2 mos. Taking phentermine helps some. consistent   anxiety better generally.  Can get overwhelmed in her thoughts but less panic.  Panic 3-5 times per week with dizzy and bad HA.  Can worry and catastrophize and I'm a pessimist.  Can get in cycles of this.  When in  panic can't think straight.  Was so bad she was weaving on the road and got pulled over by police.  No tix.  She felt unfocused from panic the night before.  For the  last 3 years, once in a blue moon, with anxiety feels numbness and tingling in arms and legs.    Past psych meds:  Zoloft 200 NR & SE,  paxil  60 good resp but then relapsed with reduction to 45 mg daily.  Xanax  1 mg BID ., hydroxyzine, trazodone  100 helped. Hx severe asthma so no propranolol   Review of Systems:  Review of Systems  Constitutional:  Negative for activity change.  HENT:  Positive for voice change.   Respiratory:  Positive for shortness of breath and wheezing. Negative for chest tightness.   Musculoskeletal:  Positive for arthralgias and back pain.  Neurological:  Negative for dizziness and tremors.  Psychiatric/Behavioral:  Negative for agitation, behavioral problems, confusion, decreased concentration, hallucinations, self-injury, sleep disturbance and suicidal ideas. The patient is nervous/anxious. The patient is not hyperactive.   asthma about the same.  Medications: I have reviewed the patient's current medications.  Current Outpatient Medications  Medication Sig Dispense Refill   acetaminophen  (TYLENOL ) 500 MG tablet Take 2 tablets (1,000 mg total) by mouth every 6 (six) hours as needed for moderate pain. 30 tablet 0   albuterol  (VENTOLIN  HFA) 108 (90 Base) MCG/ACT inhaler Inhale 2 puffs into the lungs every 6 (six) hours as needed for wheezing or shortness of breath. 6.7 g 2   ALPRAZolam  (XANAX ) 1 MG tablet Take 1 tablet (1 mg total) by mouth 2 (two) times daily as needed for anxiety. 30 tablet 2   amLODipine  (NORVASC ) 5 MG tablet Take 5 mg by mouth daily.     ARIPiprazole (ABILIFY) 2 MG tablet Take 1 tablet (2 mg total) by mouth daily. 30 tablet 1   budesonide -glycopyrrolate-formoterol  (BREZTRI  AEROSPHERE) 160-9-4.8 MCG/ACT AERO inhaler Inhale 2 puffs into the lungs in the morning and at bedtime. 10.7 g 11   CRANBERRY PO Take 1 capsule by mouth in the morning and at bedtime.     diphenhydrAMINE  (BENADRYL ) 25 mg capsule Take 25 mg by mouth every 6 (six) hours as  needed for allergies.     fexofenadine  (ALLEGRA ) 180 MG tablet Take 1 tablet (180 mg total) by mouth daily. 30 tablet 11   ibuprofen  (ADVIL ) 200 MG tablet Take 800 mg by mouth every 6 (six) hours as needed for moderate pain (pain score 4-6). 4 tablets as needed     levothyroxine  (SYNTHROID ) 75 MCG tablet Take 75 mcg by mouth daily before breakfast.     metFORMIN (GLUCOPHAGE-XR) 500 MG 24 hr tablet Take 500 mg by mouth daily.     montelukast  (SINGULAIR ) 10 MG tablet Take 1 tablet (10 mg total) by mouth every evening. 90 tablet 3   Multiple Vitamins-Minerals (MULTIVITAMIN WOMEN) TABS Take 1 tablet by mouth daily.     norethindrone -ethinyl estradiol  (LOESTRIN  1/20, 21,) 1-20 MG-MCG tablet Take 1 tablet by mouth daily.     omeprazole  (PRILOSEC ) 40 MG capsule Take 40 mg by mouth 2 (two) times daily.     ondansetron  (ZOFRAN -ODT) 4 MG disintegrating tablet 4mg  ODT q4 hours prn nausea/vomit 10 tablet 0   PARoxetine  (PAXIL ) 30 MG tablet Take 2 tablets (60 mg total) by mouth daily. 180 tablet 1   phentermine (ADIPEX-P) 37.5 MG tablet Take 37.5 mg by mouth daily before breakfast.     propranolol  (INDERAL ) 20 MG tablet TAKE 1  TABLET(20 MG) BY MOUTH TWICE DAILY AS NEEDED FOR ANXIETY 180 tablet 0   topiramate (TOPAMAX) 50 MG tablet Take 50 mg by mouth daily.     traZODone  (DESYREL ) 150 MG tablet Take 1 tablet (150 mg total) by mouth at bedtime. 90 tablet 1   No current facility-administered medications for this visit.    Medication Side Effects: None  Allergies:  Allergies  Allergen Reactions   Ciprofloxacin Hcl Diarrhea and Other (See Comments)    Developed C-Diff   Fluticasone  Propionate     Headaches   Triamcinolone  Acetonide      rash on face    Past Medical History:  Diagnosis Date   Angio-edema    ankles   Anxiety    Asthma    Back pain    Constipation    Fatigue    GERD (gastroesophageal reflux disease)    Hypertension    Hypothyroid    Hypothyroidism    Sleep apnea    CPAP     Family History  Problem Relation Age of Onset   Hypertension Mother    Hypertension Father    Adrenal disorder Father        uncertain type    Social History   Socioeconomic History   Marital status: Single    Spouse name: Not on file   Number of children: 0   Years of education: 16   Highest education level: Not on file  Occupational History   Not on file  Tobacco Use   Smoking status: Never    Passive exposure: Never   Smokeless tobacco: Never  Vaping Use   Vaping status: Never Used  Substance and Sexual Activity   Alcohol use: Never   Drug use: Never   Sexual activity: Not Currently    Birth control/protection: Pill  Other Topics Concern   Not on file  Social History Narrative   Left handed   One story home   Drinks caffeine   Social Drivers of Health   Financial Resource Strain: Not on file  Food Insecurity: Not on file  Transportation Needs: Not on file  Physical Activity: Not on file  Stress: Not on file  Social Connections: Unknown (07/13/2022)   Received from [facility]   Social Network    Social Network: Not on file  Intimate Partner Violence: Not At Risk (03/10/2023)   Received from [facility]   HITS    Over the last 12 months how often did your partner physically hurt you?: Never    Over the last 12 months how often did your partner insult you or talk down to you?: Never    Over the last 12 months how often did your partner threaten you with physical harm?: Never    Over the last 12 months how often did your partner scream or curse at you?: Never    Past Medical History, Surgical history, Social history, and Family history were reviewed and updated as appropriate.   Please see review of systems for further details on the patient's review from today.   Objective:   Physical Exam:  There were no vitals taken for this visit.  Physical Exam Constitutional:      General: She is not in acute distress.    Appearance: She is obese.   Neurological:     Mental Status: She is alert and oriented to person, place, and time.  Psychiatric:        Attention and Perception: She is attentive. She does  not perceive auditory hallucinations.        Mood and Affect: Mood is anxious. Mood is not depressed. Affect is not labile, blunt or angry.        Speech: Speech normal. Speech is not slurred.        Behavior: Behavior normal. Behavior is not withdrawn.        Thought Content: Thought content normal. Thought content is not delusional. Thought content does not include homicidal or suicidal ideation. Thought content does not include suicidal plan.        Cognition and Memory: Cognition normal.        Judgment: Judgment normal.     Comments: Insight intact. Not depressed No auditory or visual hallucinations.  Anxiety chronic with stress at work and church.  Talkative without pressure.     Lab Review:     Component Value Date/Time   NA 136 08/05/2023 1415   K 3.8 08/05/2023 1415   CL 105 08/05/2023 1415   CO2 21 (L) 08/05/2023 1415   GLUCOSE 87 08/05/2023 1415   BUN 13 08/05/2023 1415   CREATININE 1.08 (H) 08/05/2023 1415   CALCIUM 9.5 08/05/2023 1415   PROT 7.8 08/05/2023 1415   ALBUMIN 3.9 08/05/2023 1415   AST 36 08/05/2023 1415   ALT 40 08/05/2023 1415   ALKPHOS 64 08/05/2023 1415   BILITOT 0.5 08/05/2023 1415   GFRNONAA >60 08/05/2023 1415   GFRAA >60 12/26/2019 1440       Component Value Date/Time   WBC 7.2 04/01/2024 1645   RBC 4.33 04/01/2024 1645   HGB 13.5 04/01/2024 1645   HCT 39.9 04/01/2024 1645   PLT 315.0 04/01/2024 1645   MCV 92.2 04/01/2024 1645   MCH 31.1 08/05/2023 1415   MCHC 33.7 04/01/2024 1645   RDW 13.0 04/01/2024 1645   LYMPHSABS 2.6 04/01/2024 1645   MONOABS 0.5 04/01/2024 1645   EOSABS 0.1 04/01/2024 1645   BASOSABS 0.1 04/01/2024 1645    No results found for: POCLITH, LITHIUM   No results found for: PHENYTOIN, PHENOBARB, VALPROATE, CBMZ   .res Assessment: Plan:     Panic disorder with agoraphobia - Plan: ARIPiprazole (ABILIFY) 2 MG tablet  Major depressive disorder with single episode, in full remission  Insomnia due to mental condition   30 min face to face time with patient .  We discussed History of severe panic disorder and mild to moderate major depression which did not respond to sertraline 200 mg a day.  Continue paroxetine  to greater than usual dose at 60 to try to limit the need of Bz.  It has helped to partially manage the anxiety and mood.  Increase in paroxetine  was effective at further reducing anxiety previously . Residual anxiety manageable. She remains easily stressed.  More work stress.    Complaining of fatigue but can't take other stimulants with phentermine. Consider option Wellbutrin.  But she'll discuss with PCP tomorrow.    Using CPAP without O2 at night.  Better with wt loss.  We discussed the short-term risks associated with benzodiazepines including sedation and increased fall risk among others.  Discussed long-term side effect risk including dependence, potential withdrawal symptoms, and the potential eventual dose-related risk of dementia.  But recent studies from 2020 dispute this association between benzodiazepines and dementia risk. Newer studies in 2020 do not support an association with dementia.   Disc risk with opiates.  She is not planning on consistent opiates.   She's using sparingly. Continue Xanax  1 mg BID  prn  For sleep continue trazodone   150 mg HS. It helps stop worry at night. Disc sleep hygiene and restriction.  20 min counseling:  rCBT around acitivity helping anxiety, cog self talk using Scripture, etc. Counseling might help with her dealing with work stress.   Still continues counseling and it helps.  Has had extensive counseling .  reduced paroxetine  45 mg daily in summer to see if energy is better and it was. So far panic not better since increasing paroxetine  back to 60 mg daily.  Not much  better.   Potentiate with Abilify 2 mg daily. Discussed potential metabolic side effects associated with atypical antipsychotics, as well as potential risk for movement side effects. Advised pt to contact office if movement side effects occur.  Call in 2 weeks if not better.  PCP rx propranolol  20 mg prn and it helps.  Disc risk with asthma but not having to use rescue inhaler.    continue Xanax  1 mg Bid prn, trazodone  150 HS prn Disc risk increase anxiety  Follow-up ASAP  She agrees with the plan.   Lorene Macintosh MD, DFAPA  Please see After Visit Summary for patient specific instructions.  Future Appointments  Date Time Provider Department Center  06/21/2024  8:40 AM [facility] 1 [facility]  06/21/2024  8:50 AM [facility] US  1 [facility]     No orders of the defined types were placed in this encounter.      -------------------------------

## 2024-06-21 ENCOUNTER — Inpatient Hospital Stay
Admission: RE | Admit: 2024-06-21 | Discharge: 2024-06-21 | Attending: Obstetrics and Gynecology | Admitting: Obstetrics and Gynecology

## 2024-06-21 DIAGNOSIS — N6310 Unspecified lump in the right breast, unspecified quadrant: Secondary | ICD-10-CM

## 2024-06-21 DIAGNOSIS — R928 Other abnormal and inconclusive findings on diagnostic imaging of breast: Secondary | ICD-10-CM

## 2024-06-21 DIAGNOSIS — N6489 Other specified disorders of breast: Secondary | ICD-10-CM

## 2024-07-02 ENCOUNTER — Ambulatory Visit: Payer: Self-pay | Admitting: Primary Care

## 2024-07-02 MED ORDER — PAXLOVID (300/100) 20 X 150 MG & 10 X 100MG PO TBPK: 3.0000 | ORAL_TABLET | Freq: Two times a day (BID) | ORAL | 0 refills | Status: AC

## 2024-07-02 MED ORDER — PREDNISONE 10 MG PO TABS: ORAL_TABLET | ORAL | 0 refills | Status: AC

## 2024-07-02 NOTE — Telephone Encounter (Signed)
 I called and spoke to pt. Pt informed of BW note and verbalized understanding. NFN

## 2024-07-02 NOTE — Telephone Encounter (Signed)
 FYI Only or Action Required?: Action required by provider: update on patient condition.  Patient is followed in Pulmonology for OSA and asthma, last seen on 04/01/2024 by Hope Almarie ORN, NP.  Called Nurse Triage reporting Covid Positive.  Symptoms began 2 days ago.  Interventions attempted: Increased fluids/rest.  Symptoms are: unchanged.  Triage Disposition: Call PCP Now  Patient/caregiver understands and will follow disposition?:   Copied from CRM [id number]. Topic: Clinical - Red Word Triage >> Jul 02, 2024  9:21 AM Leila BROCKS wrote: Red Word that prompted transfer to Nurse Triage: Patient [phone] states tested positive for Covid this morning and would like medication [facility]. Shelah [facility]. Patient states symptoms is hard time to breath, shortness of breath, wheezing, diarrhea, bloating and gas started two days ago and getting worse. Please advise.  [id number] [address], [place] - [address] [place] [address] Phone: [phone] Fax: [phone] Reason for Disposition  Oxygen  level (e.g., pulse oximetry) 91 to 94%  Answer Assessment - Initial Assessment Questions 1. ONSET: When did the nasal discharge start?      2 days  2. AMOUNT: How much discharge is there?      A lot  3. COUGH: Do you have a cough? If Yes, ask: Describe the color of your mucus. (e.g., clear, white, yellow, green)     Occasionally, greenish mucus  4. RESPIRATORY DISTRESS: Describe your breathing.      None at this time. Reports wheezing at nighttime, reports CPAP not improving her SOB at night  5. FEVER: Do you have a fever? If Yes, ask: What is your temperature, how was it measured, and when did it start?     Denies  6. SEVERITY: Overall, how bad are you feeling right now? (e.g., doesn't interfere with normal activities, staying home from school/work, staying in bed)      I'm super tired, I don't want to do anything but sleep.   7.  OTHER SYMPTOMS: Do you have any other symptoms? (e.g., earache, mouth sores, sore throat, wheezing)     Sore throat, diarrhea, gas, stomach pains, brain's been kind of foggy, tested COVID positive  8. PREGNANCY: Is there any chance you are pregnant? When was your last menstrual period?     Denies  Answer Assessment - Initial Assessment Questions 1. SYMPTOMS: What is your main symptom or concern? (e.g., cough, fever, shortness of breath, muscle aches)     Constant tight feeling around lungs like I got saran wrapped around it  2. ONSET: When did the symptoms start?      2 days  3. COUGH: Do you have a cough? If Yes, ask: How bad is the cough?       Occasionally  8. COVID-19 DIAGNOSIS: How do you know that you have COVID? (e.g., positive lab test or self-test, diagnosed by doctor or NP/PA, symptoms after exposure).     Positive home test  11. HIGH RISK DISEASE: Do you have any chronic medical problems? (e.g., asthma, heart or lung disease, weak immune system, obesity, etc.)       OSA, asthma  13. O2 SATURATION MONITOR:  Do you use an oxygen  saturation monitor (pulse oximeter) at home? If Yes, ask What is your reading (oxygen  level) today? What is your usual oxygen  saturation reading? (e.g., 95%)       94% approx 2 hours ago  Protocols used: Common Cold-A-AH, COVID-19 - Diagnosed or Suspected-A-AH

## 2024-07-02 NOTE — Telephone Encounter (Signed)
 I'll send in paxlovid and prednisone  taper, increased risk for decompensation due to underlying asthma. Needs to use back up method for birth control as paxlovid can reduce contraceptive effectiveness.

## 2024-08-05 ENCOUNTER — Telehealth: Admitting: Psychiatry

## 2024-08-05 ENCOUNTER — Encounter: Payer: Self-pay | Admitting: Psychiatry

## 2024-08-05 DIAGNOSIS — F325 Major depressive disorder, single episode, in full remission: Secondary | ICD-10-CM

## 2024-08-05 DIAGNOSIS — F5105 Insomnia due to other mental disorder: Secondary | ICD-10-CM

## 2024-08-05 DIAGNOSIS — F4001 Agoraphobia with panic disorder: Secondary | ICD-10-CM

## 2024-08-05 MED ORDER — TRAZODONE HCL 150 MG PO TABS: 150.0000 mg | ORAL_TABLET | Freq: Every day | ORAL | 1 refills | Status: AC

## 2024-08-05 MED ORDER — PAROXETINE HCL 30 MG PO TABS: 60.0000 mg | ORAL_TABLET | Freq: Every day | ORAL | 1 refills | Status: AC

## 2024-08-05 MED ORDER — ARIPIPRAZOLE 2 MG PO TABS: 2.0000 mg | ORAL_TABLET | Freq: Every day | ORAL | 1 refills | Status: AC

## 2024-08-05 MED ORDER — ALPRAZOLAM 1 MG PO TABS: 1.0000 mg | ORAL_TABLET | Freq: Two times a day (BID) | ORAL | 2 refills | Status: AC | PRN

## 2024-08-06 ENCOUNTER — Other Ambulatory Visit: Payer: Self-pay | Admitting: Psychiatry

## 2024-08-06 DIAGNOSIS — F4001 Agoraphobia with panic disorder: Secondary | ICD-10-CM

## 2025-01-06 ENCOUNTER — Ambulatory Visit: Admitting: Psychiatry
